# Patient Record
Sex: Female | Born: 1960 | Race: White | Hispanic: No | Smoking: Former smoker
Health system: Southern US, Community
[De-identification: ages and names within clinical notes are randomized; demographics above are authoritative.]

## PROBLEM LIST (undated history)

## (undated) DIAGNOSIS — R112 Nausea with vomiting, unspecified: Secondary | ICD-10-CM

## (undated) DIAGNOSIS — J449 Chronic obstructive pulmonary disease, unspecified: Secondary | ICD-10-CM

## (undated) DIAGNOSIS — M797 Fibromyalgia: Secondary | ICD-10-CM

## (undated) DIAGNOSIS — K219 Gastro-esophageal reflux disease without esophagitis: Secondary | ICD-10-CM

## (undated) DIAGNOSIS — Z22322 Carrier or suspected carrier of Methicillin resistant Staphylococcus aureus: Secondary | ICD-10-CM

## (undated) DIAGNOSIS — R011 Cardiac murmur, unspecified: Secondary | ICD-10-CM

## (undated) DIAGNOSIS — I10 Essential (primary) hypertension: Secondary | ICD-10-CM

## (undated) DIAGNOSIS — I959 Hypotension, unspecified: Secondary | ICD-10-CM

## (undated) DIAGNOSIS — Z9889 Other specified postprocedural states: Secondary | ICD-10-CM

## (undated) DIAGNOSIS — B1920 Unspecified viral hepatitis C without hepatic coma: Secondary | ICD-10-CM

## (undated) DIAGNOSIS — F419 Anxiety disorder, unspecified: Secondary | ICD-10-CM

## (undated) DIAGNOSIS — K7460 Unspecified cirrhosis of liver: Secondary | ICD-10-CM

## (undated) DIAGNOSIS — G43909 Migraine, unspecified, not intractable, without status migrainosus: Secondary | ICD-10-CM

## (undated) DIAGNOSIS — E039 Hypothyroidism, unspecified: Secondary | ICD-10-CM

## (undated) HISTORY — PX: HEMORRHOID SURGERY: SHX153

## (undated) HISTORY — PX: RECTAL PROLAPSE REPAIR: SHX759

## (undated) HISTORY — PX: PARTIAL HYSTERECTOMY: SHX80

## (undated) HISTORY — DX: Migraine, unspecified, not intractable, without status migrainosus: G43.909

## (undated) HISTORY — PX: CHOLECYSTECTOMY: SHX55

## (undated) HISTORY — PX: ICD IMPLANT: EP1208

## (undated) HISTORY — DX: Carrier or suspected carrier of methicillin resistant Staphylococcus aureus: Z22.322

## (undated) HISTORY — PX: BACK SURGERY: SHX140

## (undated) HISTORY — DX: Hypothyroidism, unspecified: E03.9

## (undated) HISTORY — DX: Unspecified viral hepatitis C without hepatic coma: B19.20

## (undated) HISTORY — PX: ABDOMINAL HYSTERECTOMY: SHX81

---

## 1995-08-10 HISTORY — PX: WRIST SURGERY: SHX841

## 2002-02-19 ENCOUNTER — Inpatient Hospital Stay (HOSPITAL_COMMUNITY): Admission: RE | Admit: 2002-02-19 | Discharge: 2002-02-23 | Payer: Self-pay | Admitting: General Surgery

## 2003-08-22 ENCOUNTER — Ambulatory Visit (HOSPITAL_COMMUNITY): Admission: RE | Admit: 2003-08-22 | Discharge: 2003-08-22 | Payer: Self-pay | Admitting: Family Medicine

## 2003-09-06 ENCOUNTER — Ambulatory Visit (HOSPITAL_COMMUNITY): Admission: RE | Admit: 2003-09-06 | Discharge: 2003-09-07 | Payer: Self-pay | Admitting: Neurosurgery

## 2004-01-14 ENCOUNTER — Encounter: Admission: RE | Admit: 2004-01-14 | Discharge: 2004-01-14 | Payer: Self-pay | Admitting: Neurosurgery

## 2004-02-03 ENCOUNTER — Encounter: Admission: RE | Admit: 2004-02-03 | Discharge: 2004-02-03 | Payer: Self-pay | Admitting: Neurosurgery

## 2005-10-21 ENCOUNTER — Ambulatory Visit: Payer: Self-pay | Admitting: Internal Medicine

## 2005-11-10 ENCOUNTER — Observation Stay (HOSPITAL_COMMUNITY): Admission: RE | Admit: 2005-11-10 | Discharge: 2005-11-11 | Payer: Self-pay | Admitting: General Surgery

## 2005-11-10 ENCOUNTER — Encounter (INDEPENDENT_AMBULATORY_CARE_PROVIDER_SITE_OTHER): Payer: Self-pay | Admitting: Specialist

## 2005-11-12 ENCOUNTER — Emergency Department (HOSPITAL_COMMUNITY): Admission: EM | Admit: 2005-11-12 | Discharge: 2005-11-12 | Payer: Self-pay | Admitting: Emergency Medicine

## 2005-12-08 ENCOUNTER — Ambulatory Visit: Payer: Self-pay | Admitting: Internal Medicine

## 2006-04-15 ENCOUNTER — Ambulatory Visit: Payer: Self-pay | Admitting: Internal Medicine

## 2007-01-05 ENCOUNTER — Ambulatory Visit: Payer: Self-pay | Admitting: Internal Medicine

## 2007-09-04 ENCOUNTER — Emergency Department (HOSPITAL_COMMUNITY): Admission: EM | Admit: 2007-09-04 | Discharge: 2007-09-04 | Payer: Self-pay | Admitting: Emergency Medicine

## 2007-09-06 ENCOUNTER — Inpatient Hospital Stay (HOSPITAL_COMMUNITY): Admission: EM | Admit: 2007-09-06 | Discharge: 2007-09-10 | Payer: Self-pay | Admitting: Emergency Medicine

## 2007-09-07 ENCOUNTER — Encounter (INDEPENDENT_AMBULATORY_CARE_PROVIDER_SITE_OTHER): Payer: Self-pay | Admitting: General Surgery

## 2007-11-09 ENCOUNTER — Encounter: Admission: RE | Admit: 2007-11-09 | Discharge: 2007-11-09 | Payer: Self-pay | Admitting: Neurosurgery

## 2010-12-22 NOTE — Assessment & Plan Note (Signed)
Karla Young, Karla Young                  CHART#:  [id number]   DATE:  01/05/2007                       DOB:  [birth year]   CHIEF COMPLAINT:  Follow up HCV/GERD.   SUBJECTIVE:  Patient is a 50 year old Caucasian female with a history of  chronic HCV.  She began treatment with Pegasys and Copegus with a start  date of April 15, 2006 for her hepatitis C genotype 3E.  She was  initially treated in 2002, responded to therapy.  In June, 2008, she had  undetectable HCV RNA performed by Dr. Selinda Flavin.  She presented again  in 2007 with elevated LFTs and was noted to have a positive HCV RNA.  Again, her genotype is 1 and 2.  Her boyfriend has a history of non-A  and non-B hepatitis back in 1990 but has not had any further workup.  She discontinued treatment on June 10, 2006 after recurrent bouts of  MRSA.  She also has diabetes mellitus.  She tells me her TSH has been  abnormal, and her dose of Levoxyl has been changed recently by Dr.  Dimas Aguas.  She otherwise is doing well.  She is wanting to postpone hep C  treatment at this time.   CURRENT MEDICATIONS:  See the list from Jan 05, 2007.   ALLERGIES:  No known drug allergies.   OBJECTIVE:  VITAL SIGNS:  Weight 190 pounds.  Height 64 inches.  Temp  98.6, blood pressure 128/78, pulse 60.  GENERAL:  She is a well-developed and well-nourished female in no acute  distress.  HEENT:  Sclerae clear, nonicteric.  Conjunctivae are pink.  Oropharynx  is pink and moist without any lesions.  CHEST:  Heart has a regular rate and rhythm, normal S1 and S2.  ABDOMEN:  Positive bowel sounds x4.  No bruits auscultated.  Soft,  nontender, nondistended without palpable mass or hepatosplenomegaly.  No  rebound tenderness or guarding.  EXTREMITIES:  Without clubbing or edema bilaterally.   ASSESSMENT:  Karla Young is a 50 year old female with a history of  hepatitis C genotype 3E with successful treatment back in 2002, who  presented in 2007 with hepatitis C  genotype 1 and 2.  She discontinued,  after approximately eight weeks of Pegasys and Copegus, treatment due to  recurrent MRSA.  She and her partner have multiple questions today  regarding treatment.  She is wanting to try treatment again in the Fall  possibly, but not at this time.  I discussed with her treatment options,  which would include f/u with Dr. Karilyn Cota again in the Fall versus being  seen at the hepatitis C clinic in [place].   She has chronic gastroesophageal reflux disease, well controlled on the  AcipHex.   PLAN:  1. She is going to call in the fall and either discuss possible      treatment options with Dr. Karilyn Cota or HCV clinic in [place].  2. She is to call us if she has any problems in the interim.  3. Labs from Dr. Jeannette How office will also be reviewed.       Nicholas Lose, N.P.  Electronically Signed     Lionel December, M.D.  Electronically Signed    KC/MEDQ  D:  01/06/2007  T:  01/06/2007  Job:  [id number]   cc:   Caryn Bee  Dimas Aguas

## 2010-12-22 NOTE — H&P (Signed)
NAME:  CIERRAH, DACE NO.:  [id number]   MEDICAL RECORD NO.:  [id number]          PATIENT TYPE:  EMS   LOCATION:  ED                            FACILITY:  [facility]   PHYSICIAN:  Osvaldo Shipper, MD     DATE OF BIRTH:  1961/07/03   DATE OF ADMISSION:  09/06/2007  DATE OF DISCHARGE:  LH                              HISTORY & PHYSICAL   PRIMARY MEDICAL DOCTOR:  [place].  [place]   INFECTIOUS DISEASE:  Dr. Wyman Songster?, ID at [facility].   ADMITTING DIAGNOSES:  1. Right posterior thigh methicillin-resistant Staphylococcus aureus      infection.  2. Rule out abscess.  3. History of recurrent MRSA infections.  4. Hypothyroidism.  5. History of depression.  6. History of hepatitis C.   CHIEF COMPLAINT:  Right leg pain for the past 6 days.   HISTORY OF PRESENT ILLNESS:  The patient is a 50 year old Caucasian  female who has a history of recurrent MRSA.  The last episode was in the  left posterior thigh about 1-1/2 months ago.  The patient has been on  Bactrim on-and-off for the past almost 2 years.  She developed a small  lesion in the back of her right thigh about a week ago.  This  progressively increased in size, and became more and more painful.  The  pain increased to about a 10/10 in intensity.  She came into the  emergency department, on Sunday night, she was seen by the ED physician  who gave her a dose of Ancef and prescribed her Bactrim.  This, however,  did not relieve her symptoms; and her lesion got worse; and she decided  to come back to the hospital today.  She has been having fever with  chills.  Denies any nausea, vomiting, or diarrhea.  Denies any shortness  of breath, or chest pain.   MEDICATIONS AT HOME INCLUDE:  1. Imitrex as needed orally.  2. Xanax 1 mg t.i.d.  3. Levothyroxine 112 mcg daily.  4. Effexor 75 mg daily.  5. Aciphex 20 mg daily.  6. Bactrim.  7. Up until today, she was on Vicodin and Percocet, as  needed, for      pain.   ALLERGIES:  No known drug allergies.   PAST MEDICAL HISTORY:  1. Started having MRSA skin infections back in 2007 and she has had      episodes of recurrent disease since then.  She has been followed by      the infectious disease clinic in [facility].  2. History of hypothyroidism.  3. Depression.  4. She had cholecystectomy in 2007 after which all of her problems      started, according to the patient.  5. History of hernia repair.  6. History of right wrist surgery.  7. C-section.  8. Rectal prolapse.  9. Hemorrhoids in the past.   SOCIAL HISTORY:  Lives in [place] with her boyfriend, smoked one half pack  of cigarettes on a daily basis.  Occasional alcohol use.  Denies  any to  illicit drug use.   FAMILY HISTORY:  Positive for heart trouble of unknown type.   REVIEW OF SYSTEMS:  GENERAL REVIEW OF SYSTEMS:  positive for weakness.  HEENT:  Unremarkable.  CARDIOVASCULAR:  Unremarkable.  RESPIRATORY:  Unremarkable.  GI:  Unremarkable.  GU:  Unremarkable.  DERMATOLOGIC:  As  in HPI.  All other systems unremarkable.   PHYSICAL EXAMINATION:  VITAL SIGNS:  Temperature 98.4, blood pressure  117/79, heart rate 80, respiratory rate 20, saturation 95% on room air.  GENERAL EXAM:  An obese, white female in no distress.  HEENT:  There is no pallor, no icterus.  Oral mucous membranes are  slightly done dry.  No oral lesions are noted.  NECK:  Soft and supple.  No thyromegaly is appreciated.  LUNGS:  Clear to auscultation bilaterally.  No wheezes, rales, rhonchi.  CARDIOVASCULAR:  S1-S2 is normal, regular, no murmurs appreciated.  ABDOMEN:  Soft, nontender, nondistended.  Bowel sounds are present.  No  masses or organomegaly are appreciated.  SKIN EXAMINATION:  The posterior aspect of the right thigh reveals an  erythematosus, warm, tender lesion which has spread to occupy most of  the posterior right thigh, and also is spreading anteriorly.  The whole   area is tender to palpation.  Induration is also noted around the  central aspect of this lesion which is draining yellowish, bloody  material.  EXTREMITIES:  Peripheral pulses are palpable.   LABORATORY DATA:  The only thing that we have is a CBC which shows a  white count of 8.6, hemoglobin 13, platelet count of 252.  She has been  on Bactrim.  Her BMET is pending.   ASSESSMENT:  This is a 50 year old Caucasian female with medical  problems as stated earlier who presents with what appears to be a right  posterior thigh cellulitis and possibly with an abscess.  Wound cultures  from two days ago shows that this is methicillin-resistant  Staphylococcus aureus, community-acquired.   PLAN:  1. MRSA infection posterior right thigh.  This will be treated with IV      vancomycin for the next few days.  We will have surgery see this      patient to see if she needs I&D.  Poor venous access, this is      prompting a PICC line placement.  If they are unable to do that, we      will request surgery to place a central line in her, a femoral line      should be adequate.  2. History of hypothyroidism.  Continue levothyroxine.  3. History of depression.  Continue Effexor.  4. History of hepatitis C, chronic.  She has not been on treatment for      the past 1-1/2 years, and we will check her LFTs tomorrow morning.  5. The patient follows the usual precautions for MRSA, she uses      antibacterial soap.  She tells me that she has had nasal swabs done      in the past at [facility] which have been negative for MRSA.  6. Hopefully, this patient will improve in the next 3-4 days, and be      able to go home on p.o. long-term antibiotics.      Osvaldo Shipper, MD  Electronically Signed     GK/MEDQ  D:  09/06/2007  T:  09/06/2007  Job:  [id number]   cc:   Selinda Flavin  Fax: [phone]

## 2010-12-22 NOTE — Op Note (Signed)
NAME:  Karla Young, Karla Young NO.:  [id number]   MEDICAL RECORD NO.:  [id number]          PATIENT TYPE:  INP   LOCATION:  A313                          FACILITY:  [facility]   PHYSICIAN:  Barbaraann Barthel, M.D. DATE OF BIRTH:  [birth year]   DATE OF PROCEDURE:  09/07/2007  DATE OF DISCHARGE:                               OPERATIVE REPORT   PREOPERATIVE DIAGNOSIS:  Abscess posterior right thigh with another area  of folliculitis and abscess located slightly above this.   POSTOPERATIVE DIAGNOSIS:  Abscess posterior right thigh with another  area of folliculitis and abscess located slightly above this.   NOTE:  This is a 50 year old white female with history of hepatitis C  and also a past history of MRSA infections.  The last abscess was  drained in [facility] in December 2008.  She presented to the medical  service with another abscess of her posterior thigh.  A PICC line was  inserted and she has been placed on vancomycin. This area needs to be  debrided sharply and we discussed this preoperatively with her.  We  discussed complications not limited to but including bleeding, infection  and the need for possible further surgery.  Informed consent was  obtained.   GROSS OPERATIVE FINDINGS:  Consistent with a large abscess in the  posterior thigh, small folliculitis area just slightly proximal to that.   SPECIMENS:  Cultures were obtained and the debrided tissue was sent.   PROCEDURE:  Excision and debridement of right posterior thigh abscess.   TECHNIQUE:  The patient was placed in the left lateral position so that  we were able to visualize the posterior right thigh which was prepped  with Betadine solution and draped in the usual manner.  The area of the  abscess and necrotic tissue was elliptically excised. The necrotic  tissue was sharply debrided as was the small area of folliculitis above  it.  Cultures were obtained. The debrided tissue was also sent.  We then  irrigated with normal saline solution, controlled the bleeding with the  cautery device, and packed the wound open with normal saline soaked  gauze.  A sterile dressing was applied.  Prior to closure, all sponge,  needle, and instrument counts were found to be correct and estimated  blood loss was minimal.  The patient was replaced of crystalloids.   This patient will be followed by the medical service as well as myself  there will be no change of service.  I have consulted the physical  therapy department to begin dressing changes tomorrow, September 08, 2007,  and they are to irrigate this with normal saline and pack this with  saline soaked gauze.  Please note hepatitis C precautions.      Barbaraann Barthel, M.D.  Electronically Signed     WB/MEDQ  D:  09/07/2007  T:  09/07/2007  Job:  [id number]

## 2010-12-22 NOTE — Consult Note (Signed)
NAME:  Karla Young, FERNICOLA NO.:  [id number]   MEDICAL RECORD NO.:  [id number]          PATIENT TYPE:  INP   LOCATION:  A313                          FACILITY:  [facility]   PHYSICIAN:  Barbaraann Barthel, M.D. DATE OF BIRTH:  [birth year]   DATE OF CONSULTATION:  DATE OF DISCHARGE:                                 CONSULTATION   REFERRING SERVICE:  [facility].   NOTE:  Surgery was asked to see this 50 year old white female who has an  abscess on the posterior thigh; she has had this here for several days.  She has had a history of MRSA infections in the past; last abscess was  drained in December in [place].  She is a nondiabetic.  She has had  cultures of her nasal cavities; however, these were negative according  to her.  And these cultures were done apparently in [place].   PLAN:  We will plan for debridement and drainage of this abscess and  cultures in the operating room tomorrow.  She is to have warm compresses  and continue her vancomycin she through her PICC line until then.  We  discussed the surgery with her, discussing complications not limited to,  but including bleeding, infection and the possible need for further  surgery.  Informed consent was obtained.      Barbaraann Barthel, M.D.  Electronically Signed     WB/MEDQ  D:  09/06/2007  T:  09/07/2007  Job:  [id number]   cc:   [facility]

## 2010-12-25 NOTE — H&P (Signed)
[facility]  Patient:    CELINES, FEMIA [facility] Visit Number: [id number] MRN: [id number]          Service Type: Attending:  Franky Macho, M.D. Dictated by:   Franky Macho, M.D. Adm. Date:  02/12/02   CC:         Dr. Earline Mayotte, M.D.   History and Physical  DATE OF BIRTH:  [birth year]  CHIEF COMPLAINT:  Rectal prolapse.  HISTORY OF PRESENT ILLNESS:  The patient is a 50 year old white female who presents with rectal pain and bleeding secondary to a rectal prolapse.  This has been present over the last three months and has not resolved with stool softeners or ProctoFoam cream or Rowasa enemas.  She had a colonoscopy two years ago which was reportedly negative.  She is status post a hemorrhoidectomy in March of 2002.  She states she has irritable bowel disease with constipation.  She has a pressure sensation in her rectum constantly. She also has noticed some soiling of her underwear.  PAST MEDICAL HISTORY:  Includes depression, anxiety disorder, extrinsic allergies, irritable bowel disease.  PAST SURGICAL HISTORY:  As noted above, fractured wrist repair in 2000, and C-section in 1987.  CURRENT MEDICATIONS:  Include Prozac 20 mg p.o. q.d., Xanax 1 mg p.o. t.i.d., Nexium 20 mg p.o. q.d., Citrucel, and lactulose.  She also takes Allegra one tablet p.o. q.d. for her extrinsic allergies.  ALLERGIES:  No known drug allergies.  REVIEW OF SYSTEMS:  The patient does smoke daily.  She denies any significant alcohol use.  She denies any other cardiopulmonary disorders or bleeding disorders.  PHYSICAL EXAMINATION:  GENERAL:  The patient is a well-developed and well-nourished white female in no acute distress.  VITAL SIGNS:  She is afebrile and vital signs are stable.  LUNGS:  Clear to auscultation with equal breath sounds bilaterally.  HEART:  Examination reveals a regular rate and rhythm without S3, S4, or murmurs.  ABDOMEN:  Soft,  nontender, and nondistended.  No hepatosplenomegaly or masses are noted.  RECTAL:  Examination reveals a rectal prolapse which appears to be a full thickness in nature when straining.  No active bleeding is noted, though irritated mucosa is noted.  Rectal tone is fair.  IMPRESSION:  Rectal prolapse.  PLAN:  The patient is scheduled to undergo a proctopexy with possible anterior partial colectomy on February 12, 2002.  The risks and benefits of the procedure including bleeding, infection, constipation, the possibility of recurrence of the prolapse were fully explained to the patient.  She gave informed consent. She is also aware of incontinence can occur.  A Colyte prep has been prescribed. Dictated by:   Franky Macho, M.D. Attending:  Franky Macho, M.D. DD:  02/08/02 TD:  02/12/02 Job: [id number]

## 2010-12-25 NOTE — Op Note (Signed)
NAME:  Karla Young, Karla Young                           ACCOUNT NO.:  [id number]   MEDICAL RECORD NO.:  [id number]                   PATIENT TYPE:  OIB   LOCATION:  3022                                 FACILITY:  [facility]   PHYSICIAN:  Coletta Memos, M.D.                  DATE OF BIRTH:  12-30-1960   DATE OF PROCEDURE:  09/06/2003  DATE OF DISCHARGE:                                 OPERATIVE REPORT   PREOPERATIVE DIAGNOSIS:  Displaced cyst, far lateral position, left L3-4.   POSTOPERATIVE DIAGNOSIS:  Displaced cyst, far lateral position, left L3-4.   PROCEDURE:  Far lateral diskectomy with microdissection, L3-4, left.   SURGEON:  Coletta Memos, M.D.   ASSISTANT:  Hilda Lias, M.D.   ANESTHESIA:  General endotracheal.   INDICATIONS:  Karla Young is a 50 year old woman who presented with severe  pain in the back and left lower extremity since July 10, 2003.  MRI  showed a far lateral disk herniation at L3-4, compromising the left L3 nerve  root.   OPERATIVE NOTE:  Karla Young was brought to the operating room, intubated, and  placed under general anesthesia without difficulty.  Karla Young was moved prone  onto the Wilson frame, and all pressure points were properly padded.  Her  skin was prepped, and Karla Young was draped in a sterile fashion.  I infiltrated 10  mL of 0.5% lidocaine with 1:200,000 epinephrine in proposed incision site.  That was done after taking a preoperative localizing x-ray.  The skin was  prepped.  Karla Young was draped in a sterile fashion. I opened the skin with a #10  blade and took that down to the thoracolumbar fascia.  I exposed what I  believed to be the lamina of L3, took an x-ray, and I was working at the  correct site.  Then finding the pars intra-articularis of L3, I removed a  small layer of the outer surface of the bone laterally until I was able to  get underneath the ligamentum flavum.  I then was able to identify the nerve  root and what was a very large hump underneath  it.  Using the  microdissection technique and with Dr. Cassandria Santee assistance, we removed what  was a large, partially calcified disk herniation, which had the L3 nerve  root under a significant amount of tension.  The disk was removed in a  piecemeal fashion using Epstein curettes and pituitary rongeurs.  When I was  satisfied that the nerve had been decompressed, I then infiltrated Depo-  Medrol around the nerve root since we were working at the ganglion.  I took  another intraoperative x-ray to make sure that I was at the neural foramen,  and I was. I controlled some bleeding with bipolar cautery.  The wound was  closed in a layered fashion using Vicryl sutures to reapproximate the  thoracolumbar fascia and subcutaneous tissues.  The skin  edges were  reapproximated with Vicryl.  Dermabond was used for a sterile dressing.  The  patient tolerated the procedure well, moving all extremities  postoperatively.                                               Coletta Memos, M.D.    KC/MEDQ  D:  09/06/2003  T:  09/07/2003  Job:  [id number]

## 2010-12-25 NOTE — Consult Note (Signed)
NAME:  Karla Young, BOBAK NO.:  [id number]   MEDICAL RECORD NO.:  [id number]          PATIENT TYPE:  EMS   LOCATION:  ED                            FACILITY:  [facility]   PHYSICIAN:  Barbaraann Barthel, M.D. DATE OF BIRTH:  1961/05/19   DATE OF CONSULTATION:  11/12/2005  DATE OF DISCHARGE:  11/12/2005                                   CONSULTATION   Surgery was notified about the ER visit of the patient, [name], who is status  post laparoscopic cholecystectomy and liver biopsy on Wednesday, three days  ago.  She did well postoperatively and was discharged.  She called with  abdominal pain and I told her to go to the emergency room and a CT scan was  performed to rule out a possibility of a biloma or postoperative bleeding  from liver biopsy.  In essence, a CT scan did not show any changes  suggestive of those things.  She had some minimal fluid which was consistent  with postoperative changes and minimal free air also consistent with  postoperative changes.  Her H&H was 13.6 and 39.5 with a white count of  12,000.  Her liver function studies were slightly elevated with an alkaline  phosphate of 110, AST of 72 and bilirubin was normal at 0.5.  Her BUN and  creatinine were within normal limits.  Her electrolytes were within normal  limits.  Please note liver function studies were elevated in this patient  who has the diagnosis of hepatitis C.   At any rate, clinically her abdomen is soft.  Bowel sounds are present.  She  has some incisional pain where her 5 mm cannula sites were placed.  There is  no undue leaking from this area or any bleeding from this area noted and her  abdomen is soft and the bowel sounds are present.  Lungs are also clear.  The rest of the exam apart from some incisional pain was normal.   It should be noted that this patient has likely a history of drug abuse and  the assessment of her pain is problematic for these reasons.  At any rate,  she is told to  continue on a clear liquid diet.  We will follow her  perioperatively.  She  is giving nothing new for pain.  She was discharged previously on Tylox.  She is still to take some lactulose as well to help her.  At any rate, there  does not appear to be any postoperative surgical problem here.  We will  follow her perioperatively after which she is to return to Dr. Lionel December  for treatment of her hepatitis C.      Barbaraann Barthel, M.D.  Electronically Signed     WB/MEDQ  D:  11/12/2005  T:  11/13/2005  Job:  [id number]   cc:   Lionel December, M.D.  [address]  [place]  [place] [address]   Nicoletta Dress. Colon Branch, M.D.  Fax: [phone]

## 2010-12-25 NOTE — Op Note (Signed)
[facility]  Patient:    Karla Young, Karla Young Visit Number: [id number] MRN: [id number]          Service Type: SUR Location: 3A A310 01 Attending Physician:  Dalia Heading Dictated by:   Franky Macho, M.D. Proc. Date: 02/19/02 Admit Date:  02/19/2002   CC:         Dr. Dimas Aguas, Percell Belt, M.D.   Operative Report  AGE:  50 years old.  PREOPERATIVE DIAGNOSIS:  Rectal prolapse.  POSTOPERATIVE DIAGNOSIS:  Rectal prolapse.  PROCEDURE:  Proctopexy, partial colectomy.  SURGEON:  Franky Macho, M.D.  ANESTHESIA:  General endotracheal.  INDICATIONS:  The patient is a 50 year old white female who presents with persistent rectal prolapse.  She now presents to the operating room for a proctopexy, possible anterior partial colectomy.  The risks and benefits of the procedure including bleeding, infection, cardiopulmonary difficulties, and the possibility of recurrence of the rectal prolapse were fully explained to the patient, gaining informed consent.  DESCRIPTION OF PROCEDURE:  Patient was placed in the supine position.  After induction of general endotracheal anesthesia, the abdomen was prepped and draped using the usual sterile technique with Betadine.  A midline incision was made below the umbilicus to the suprapubic region.  The peritoneal cavity was entered into without difficulty.  The abdomen was then explored.  Liver was within normal limits.  The NG tube was noted to be in appropriate position in the stomach.  The rest of the abdominal cavity appeared to be within normal limits and no abnormalities were found.  The right and left ovaries were within normal limits.  The uterus had no fibroids in it.  There was no evidence of endometriosis.  The patient did have a redundant sigmoid colon.  The mesentery was incised along the line of Toldt from the proximal sigmoid colon down to the rectum.  The anterior fascia over the rectum was also  incised.  The rectal attachments were freed away circumferentially down to the pelvic floor.  Next, a partial sigmoid colectomy was performed.  GIA stapler was used both proximally and distally in the sigmoid colon and fired.  Next, a side-to-side colorectal anastomosis was then performed.  The colotomy was closed using a TA 60 stapler.  The staple line was bolstered using 3-0 silk Lembert sutures.  The anastomosis was noted to be patent.  The rectum was then pexed to the sacrum using 2-0 Novofil interrupted sutures.  This was done on either side.  On inspection of the abdominal cavity, there was no redundancy of the colon from the descending colon down to the rectum.  The abdominal cavity was copiously irrigated with normal saline and gentamicin.  Surgicel was placed along the posterior sacral area.  The abdominal contents were returned into the abdominal cavity in an orderly fashion.  All surgical personnel changed their gloves.  The fascia was reapproximated using looped O Novofil running suture.  The subcutaneous layer was reapproximated using the 2-0 Vicryl interrupted suture.  The skin was closed using staples.  Betadine ointment and dry sterile dressings were applied.  All tape and needle counts were correct at the end of the procedure.  The patient was extubated in the operating room and went back to the recovery room awake in stable condition.  COMPLICATIONS:  None.  SPECIMENS:  Sigmoid colon.  BLOOD LOSS:  100 cc. Dictated by:   Franky Macho, M.D. Attending Physician:  Dalia Heading DD:  02/19/02 TD:  02/20/02 Job:  [id number]

## 2010-12-25 NOTE — Discharge Summary (Signed)
[facility]  Patient:    ENJOLI, TIDD Visit Number: [id number] MRN: [id number]          Service Type: SUR Location: 3A A310 01 Attending Physician:  Dalia Heading Dictated by:   Franky Macho, M.D. Admit Date:  02/19/2002 Discharge Date: 02/23/2002               Lionel December, M.D.  Dr. Dimas Aguas   Discharge Summary  AGE:  50 years old.  HISTORY OF PRESENT ILLNESS:  The patient is a 50 year old white female who presented for treatment of rectal prolapse.  HOSPITAL COURSE:  She underwent a proctopexy and anterior partial colectomy on February 19, 2002.  She tolerated the procedure well.  Her postoperative course was for the most part unremarkable.  Her diet was advanced without difficulty once her bowel function returned.  She states that her rectal pain has resolved.  FOLLOW-UP:  I will see the patient in follow-up on February 27, 2002.  DISCHARGE MEDICATIONS: 1. Lortab 10/650 mg one to two tablets p.o. q.6h. p.r.n. pain. 2. Colace one tablet p.o. b.i.d. 3. She is to resume other medications as previously prescribed.  PRINCIPAL DIAGNOSES: 1. Rectal prolapse. 2. Anxiety disorder. 3. Depression. 4. Extrinsic allergies.  PROCEDURES:  Proctopexy and anterior partial colectomy on February 19, 2002. Dictated by:   Franky Macho, M.D. Attending Physician:  Dalia Heading DD:  02/23/02 TD:  02/28/02 Job: [id number]

## 2010-12-25 NOTE — Discharge Summary (Signed)
[name], Young NO.:  [id number]   MEDICAL RECORD NO.:  [id number]          PATIENT TYPE:  INP   LOCATION:  A313                          FACILITY:  [facility]   PHYSICIAN:  Margaretmary Dys, M.D.DATE OF BIRTH:  [birth year]   DATE OF ADMISSION:  09/06/2007  DATE OF DISCHARGE:  02/01/2009LH                               DISCHARGE SUMMARY   DISCHARGE DIAGNOSES:  1. Methicillin resistant Staphylococcus aureus abscess of the right      thigh.  Status post incision and drainage.  2. Gastroesophageal reflux disease.  3. 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  4. History of Hepatitis C.  5. History of depression.  6. Hypothyroidism.   DISCHARGE MEDICATIONS:  1. Imitrex daily.  2. Xanax 1 mg p.o. t.i.d.  3. Levothyroxine 112 mcg daily.  4. Effexor 75 mg p.o. daily.  5. Aciphex 20 mg p.o. daily.  6. Percocet 5/325, one p.o. q.4h. p.r.n. pain.  7. Vancomycin 1 g IV q.12 for a total of 7 days, the home health nurse      has already arranged for this.   DIET:  Regular.   ACTIVITY:  As tolerated.  Patient encouraged to ambulate.   DRESSING:  Patient is to irrigate with normal saline packed with saline  soaked about 2 times a day.  The gauze size will be about 4 x 4.   HOSPITAL COURSE:  Karla Young is a 50 year old female who has a history of  recurrent MRSA infection.  Her last episode was in her left posterior  thigh about a month and a half ago for which patient has been on Bactrim  on and off for almost 2 years now.  Patient reported seeing a small  lesion on the back of her right thigh about a week ago, this apparently  increased in size and became more painful.  The pain became fairly  severe and she reported to the emergency room on the day of admission.  She received some Ancef and Bactrim and her symptoms worsened for which  she then presented to the hospital again the next day.  Patient was  having fever with  chills, she denies any nausea or vomiting.   PHYSICAL EXAMINATION AT TIME OF ADMISSION:  Reported a female who was  obese, in no distress, her temperature was 98.4.  VITALS:  Blood pressure was 117/79, heart rate of 80, respiratory rate  of 20, saturation was 95% on room air.  RIGHT THIGH EXAM:  Erythematous, warm tender lesion which spread to most  of the posterior right thigh and also anteriorly.  The area was tender  to palpation.  There was induration in the central aspect of this lesion  which was noted to be drainy, yellowish, bloody material.  As a result,  an incision and drainage was planned and this was done by Dr. Malvin Johns  on September 07, 2007.   A PICC line was also placed as patient was anticipated to have home  vancomycin treatment, patient has been through this before.   Patient continued to do  fairly well with no concerns for sepsis.  Patient was seen by me for the first time on September 10, 2007.  She had  been doing well, she was afebrile, she had no leukocytosis and had no  evidence of sepsis.  As a result, patient was discharged home.   CONSULTATIONS OBTAINED:  1. Barbaraann Barthel, MD, Surgery.  2. PICC line nurse for PICC line placement.   DISPOSITION:  To home.   PERTINENT LABORATORY DATA:  On the day of admission, the white blood  cell count was 8.6, hemoglobin of 13, platelet count was 252,000, basic  metabolic profile was unremarkable.      Margaretmary Dys, M.D.  Electronically Signed     AM/MEDQ  D:  10/24/2007  T:  10/24/2007  Job:  [id number]   cc:   Selinda Flavin  Fax: [phone]

## 2010-12-25 NOTE — Op Note (Signed)
NAME:  Karla Young, Karla Young NO.:  [id number]   MEDICAL RECORD NO.:  [id number]          PATIENT TYPE:  AMB   LOCATION:  DAY                           FACILITY:  [facility]   PHYSICIAN:  Barbaraann Barthel, M.D. DATE OF BIRTH:  1960-12-01   DATE OF PROCEDURE:  11/10/2005  DATE OF DISCHARGE:                                 OPERATIVE REPORT   SURGEON:  Dr. Malvin Johns.   PREOPERATIVE DIAGNOSIS:  1.  Cholecystitis, cholelithiasis.  2.  Hepatitis C.   PROCEDURE:  Laparoscopic cholecystectomy with liver biopsy.   NOTE:  This 50 year old white female was referred by the GI service for  cholelithiasis.  She had symptoms of recurrent cholecystitis with right  upper quadrant pain, nausea and vomiting.  Her liver functions were slightly  elevated.  Bilirubin was normal, however, and she was checked out for  recurrence of her hepatitis C.  That is why liver biopsy was also requested.   We discussed the procedure in detail preoperatively, discussing  complications not limited to but including bleeding, infection, damage to  bile ducts, perforation of organs and transitory diarrhea and the  possibility of open cholecystectomy may be needed.  Informed consent was  obtained.   GROSS OPERATIVE FINDINGS:  The patient had some adhesions around her lower  incision.  There was no problem in the insertion of the cannulas.  She had  previous surgery below with a infraumbilical midline incision and an  incisional hernia which was repaired with mesh.  Right upper quadrant there  were multiple adhesions around the gallbladder, a mildly thickened  gallbladder with a short cystic duct and a large stone about the size of an  olive within it.  The common bile duct appeared to be normal, otherwise the  right upper quadrant appeared to be normal.  The liver did not appear to be  cirrhotic.   TECHNIQUE:  The patient was placed in supine position.  After the adequate  administration of general anesthesia  via endotracheal intubation, her entire  was prepped with Betadine solution and draped in usual manner.  A Foley  catheter was aseptically inserted.  A periumbilical incision was carried out  on the superior aspect of the umbilicus away from the previous surgical  scar.  We dissected down to the fascia which we opened the midline and  placed a Veress needle and inflated the abdomen with approximately 3.0  liters of CO2.  We then placed 11 mm cannula using the Visiport technique  uneventfully.  We then used the Endo shear sutures to take down some rather  dense adhesions about the gallbladder and about the liver.  These were taken  down without problem.  The gallbladder was grasped.  The cystic duct and  cystic artery were clearly visualized both of which were triply silver  clipped and divided.  The gallbladder was removed without spillage from the  liver bed using the hook cautery device.  There was an area on the rim of  the liver which lent itself easily to a biopsy.  I grasped this with the  pituitary  forceps biopsy and then excised using the Endo shears a portion of  liver with this and I was able to control the bleeding using the cautery  device.  After cauterizing I then placed a piece of Surgicel over this area  as well as over the raw liver bed and placed a Jackson-Pratt drain that  exited from one the lateral port sites.  We then closed the port sites using  0 Polysorb in the area of the umbilicus and the epigastrium and then closed  all skin incisions with a stapling device.  I used to approximately 10 mL of  1/2% Sensorcaine to help with postoperative discomfort.  This drain was  sutured in place with 3-0 nylon.  Prior to closure all sponge, needle and  instrument counts found to be correct.  Estimated blood loss was minimal.  The patient tolerated the procedure well was taken to recovery room in  satisfactory condition.      Barbaraann Barthel, M.D.  Electronically  Signed     WB/MEDQ  D:  11/10/2005  T:  11/11/2005  Job:  [id number]   cc:   Lionel December, M.D.  [address]  [place]  [place] [address]

## 2011-04-29 LAB — CBC
HCT: 34.4 — ABNORMAL LOW
HCT: 36.6
Hemoglobin: 12.1
Hemoglobin: 13
MCHC: 33.5
MCHC: 35.2
MCHC: 35.5
MCV: 90.2
Platelets: 208
Platelets: 210
Platelets: 225
RBC: 4.06
RBC: 4.33
RDW: 14.1
RDW: 14.3
RDW: 14.3
WBC: 19.6 — ABNORMAL HIGH
WBC: 8.6

## 2011-04-29 LAB — BASIC METABOLIC PANEL
BUN: 3 — ABNORMAL LOW
BUN: 4 — ABNORMAL LOW
BUN: 4 — ABNORMAL LOW
CO2: 29
CO2: 33 — ABNORMAL HIGH
Calcium: 8.4
Calcium: 8.6
Calcium: 9.8
Creatinine, Ser: 0.58
GFR calc non Af Amer: >60
Glucose, Bld: 113 — ABNORMAL HIGH
Glucose, Bld: 82
Glucose, Bld: 91
Potassium: 3.6
Sodium: 137
Sodium: 139

## 2011-04-29 LAB — CULTURE, ROUTINE-ABSCESS

## 2011-04-29 LAB — DIFFERENTIAL
Basophils Absolute: 0
Basophils Absolute: 0
Basophils Absolute: 0.1
Basophils Relative: 0
Basophils Relative: 1
Basophils Relative: 1
Eosinophils Absolute: 0.2
Eosinophils Absolute: 0.5
Eosinophils Relative: 1
Eosinophils Relative: 10 — ABNORMAL HIGH
Lymphocytes Relative: 13
Lymphocytes Relative: 40
Lymphs Abs: 2.4
Monocytes Absolute: 0.5
Monocytes Absolute: 0.9
Monocytes Absolute: 1.7 — ABNORMAL HIGH
Monocytes Relative: 11
Neutro Abs: 2.1
Neutrophils Relative %: 55

## 2011-04-29 LAB — ANAEROBIC CULTURE

## 2011-04-29 LAB — WOUND CULTURE: Gram Stain: NONE SEEN

## 2011-04-29 LAB — HEPATIC FUNCTION PANEL: Bilirubin, Direct: <0.1

## 2014-02-12 ENCOUNTER — Encounter (INDEPENDENT_AMBULATORY_CARE_PROVIDER_SITE_OTHER): Payer: Self-pay | Admitting: Internal Medicine

## 2014-02-12 ENCOUNTER — Other Ambulatory Visit (INDEPENDENT_AMBULATORY_CARE_PROVIDER_SITE_OTHER): Payer: Self-pay | Admitting: Internal Medicine

## 2014-02-12 ENCOUNTER — Encounter (INDEPENDENT_AMBULATORY_CARE_PROVIDER_SITE_OTHER): Payer: Self-pay | Admitting: *Deleted

## 2014-02-12 ENCOUNTER — Ambulatory Visit (INDEPENDENT_AMBULATORY_CARE_PROVIDER_SITE_OTHER): Payer: Managed Care, Other (non HMO) | Admitting: Internal Medicine

## 2014-02-12 VITALS — BP 156/97 | HR 76 | Temp 98.0°F | Ht 64.0 in | Wt 175.4 lb

## 2014-02-12 DIAGNOSIS — E039 Hypothyroidism, unspecified: Secondary | ICD-10-CM

## 2014-02-12 DIAGNOSIS — B182 Chronic viral hepatitis C: Secondary | ICD-10-CM

## 2014-02-12 DIAGNOSIS — B1920 Unspecified viral hepatitis C without hepatic coma: Secondary | ICD-10-CM | POA: Insufficient documentation

## 2014-02-12 LAB — CBC WITH DIFFERENTIAL/PLATELET
BASOS ABS: 0.1 10*3/uL (ref 0.0–0.1)
Basophils Relative: 1 % (ref 0–1)
EOS ABS: 0.3 10*3/uL (ref 0.0–0.7)
EOS PCT: 3 % (ref 0–5)
HEMATOCRIT: 43.7 % (ref 36.0–46.0)
Hemoglobin: 15.3 g/dL — ABNORMAL HIGH (ref 12.0–15.0)
LYMPHS PCT: 25 % (ref 12–46)
Lymphs Abs: 2.4 10*3/uL (ref 0.7–4.0)
MCH: 30.3 pg (ref 26.0–34.0)
MCHC: 35 g/dL (ref 30.0–36.0)
MCV: 86.5 fL (ref 78.0–100.0)
MONO ABS: 0.8 10*3/uL (ref 0.1–1.0)
Monocytes Relative: 8 % (ref 3–12)
Neutro Abs: 6 10*3/uL (ref 1.7–7.7)
Neutrophils Relative %: 63 % (ref 43–77)
PLATELETS: 190 10*3/uL (ref 150–400)
RBC: 5.05 MIL/uL (ref 3.87–5.11)
RDW: 14.5 % (ref 11.5–15.5)
WBC: 9.5 10*3/uL (ref 4.0–10.5)

## 2014-02-12 LAB — COMPREHENSIVE METABOLIC PANEL
ALBUMIN: 4.2 g/dL (ref 3.5–5.2)
ALT: 83 U/L — ABNORMAL HIGH (ref 0–35)
AST: 71 U/L — ABNORMAL HIGH (ref 0–37)
Alkaline Phosphatase: 89 U/L (ref 39–117)
BILIRUBIN TOTAL: 0.5 mg/dL (ref 0.2–1.2)
BUN: 8 mg/dL (ref 6–23)
CO2: 26 meq/L (ref 19–32)
Calcium: 9.9 mg/dL (ref 8.4–10.5)
Chloride: 103 mEq/L (ref 96–112)
Creat: 0.81 mg/dL (ref 0.50–1.10)
GLUCOSE: 95 mg/dL (ref 70–99)
POTASSIUM: 3.8 meq/L (ref 3.5–5.3)
SODIUM: 140 meq/L (ref 135–145)
TOTAL PROTEIN: 7.6 g/dL (ref 6.0–8.3)

## 2014-02-12 NOTE — Progress Notes (Signed)
Subjective:     Patient ID: Karla Young, female   DOB: 11/20/1960, 53 y.o.   MRN: [id number]  HPI Referred to our office by Dr. Sherril CroonVyas for Hepatitis C.  Diagnosed in 2000. She is Genotype 1A. No blood transfusion. She has 4 tattoos received in 1999 and 2000. No IV drug use. She has smoke marijuana in the past. She has received treatment in the past by Dr. Karilyn Cotaehman.  She apparently had treatment failure. She was treated with Ribavirin and Inteferon in 2002. From old records, she responded to  She actually started tx while [place] but she developed MRSA and tx was stopped.   11/2005 Liver biopsy: Cholecystectomy (Liver biopsy for Hepatitis C).  The findings are consistent with chronic mildly active hepatitis, inflammatory grade 2 and probably fibrosis stage 0 or early stage 1 (focal portal fibrosis). Clinical and serologic correlation is necessary to etiologic diagnosis but the findings suggest chronic mildly active viral hepatitis.    09/05/2013 WBC 10.4, H and H 15.7 and 45.7, MCV 88, platelet ct 191. Total bili 0.3, ALP 79, AST 68, ALT 71. TSH 1.490 01/10/2014 [facility] [id number], HCV log10 6.755. Hepa C AB greater than 11.0      Review of Systems Past Medical History  Diagnosis Date  . Hepatitis C   . MRSA (methicillin resistant staph aureus) culture positive   . Hypothyroid   . Hemochromatosis   . Migraines     Past Surgical History  Procedure Laterality Date  . Cholecystectomy    . Partial hysterectomy       One ovary left  . Back surgery      Allergies not on file  No current outpatient prescriptions on file prior to visit.   No current facility-administered medications on file prior to visit.        Objective:   Physical Exam  Filed Vitals:   02/12/14 1110  Height: 5\' 4"  (1.626 m)  Weight: 175 lb 6.4 oz (79.561 kg)  Alert and oriented. Skin warm and dry. Oral mucosa is moist.   . Sclera anicteric, conjunctivae is pink. Thyroid not enlarged. No  cervical lymphadenopathy. Lungs clear. Heart regular rate and rhythm.  Abdomen is soft. Bowel sounds are positive. No hepatomegaly. No abdominal masses felt. No tenderness.  No edema to lower extremities.         Assessment:    Hepatitis C with treatment failure in the past    Plan:     CMET PT/INR, AFP, US elastrography, CBC. We will treat

## 2014-02-12 NOTE — Patient Instructions (Signed)
Labs, US abdomen with elastrography. OV in 3 months.

## 2014-02-13 LAB — AFP TUMOR MARKER: AFP-Tumor Marker: 5.7 ng/mL (ref 0.0–8.0)

## 2014-02-13 LAB — PROTIME-INR
INR: 0.99 (ref <1.50)
Prothrombin Time: 13.1 seconds (ref 11.6–15.2)

## 2014-02-15 ENCOUNTER — Ambulatory Visit (HOSPITAL_COMMUNITY)
Admission: RE | Admit: 2014-02-15 | Discharge: 2014-02-15 | Disposition: A | Discharge status: Home / Self Care | Payer: Managed Care, Other (non HMO) | Source: Ambulatory Visit | Attending: Internal Medicine | Admitting: Internal Medicine

## 2014-02-15 DIAGNOSIS — B1920 Unspecified viral hepatitis C without hepatic coma: Secondary | ICD-10-CM | POA: Insufficient documentation

## 2014-02-15 DIAGNOSIS — B182 Chronic viral hepatitis C: Secondary | ICD-10-CM

## 2014-02-15 DIAGNOSIS — K7460 Unspecified cirrhosis of liver: Secondary | ICD-10-CM | POA: Insufficient documentation

## 2014-02-15 DIAGNOSIS — Z9089 Acquired absence of other organs: Secondary | ICD-10-CM | POA: Insufficient documentation

## 2014-02-20 ENCOUNTER — Telehealth (INDEPENDENT_AMBULATORY_CARE_PROVIDER_SITE_OTHER): Payer: Self-pay | Admitting: Internal Medicine

## 2014-02-20 NOTE — Telephone Encounter (Signed)
[facility]

## 2014-02-20 NOTE — Telephone Encounter (Signed)
Karla Young I am aware that this needs to be done.

## 2014-02-21 NOTE — Telephone Encounter (Signed)
I have completed paperwork for [employer]. I have shared with Camelia Engerri that we need Hepatitis C Genotype and Hepatitis C RNA Quant. Then Dewayne Hatchnn will release patient's information to [employer].

## 2014-02-22 ENCOUNTER — Telehealth (INDEPENDENT_AMBULATORY_CARE_PROVIDER_SITE_OTHER): Payer: Self-pay | Admitting: Internal Medicine

## 2014-02-22 ENCOUNTER — Encounter (INDEPENDENT_AMBULATORY_CARE_PROVIDER_SITE_OTHER): Payer: Self-pay

## 2014-02-22 NOTE — Telephone Encounter (Signed)
The Genotype and quaint are in may notes. Please review.

## 2014-03-04 ENCOUNTER — Telehealth (INDEPENDENT_AMBULATORY_CARE_PROVIDER_SITE_OTHER): Payer: Self-pay | Admitting: *Deleted

## 2014-03-04 NOTE — Telephone Encounter (Signed)
Veikra. I have spoken to the Pharmacist and has been approved

## 2014-03-04 NOTE — Telephone Encounter (Signed)
Rec'd a voice message from [employer] Young,representive. Harvoni is NOT covered. The Ribiavirin , Motertabs, and the [employer] Pak are.  She ask that Karla Young , NP let them know if any of the preferred may be prescribed.  Call back Number is [phone]  Reference Number # [id number]

## 2014-03-04 NOTE — Telephone Encounter (Signed)
Has been approved for [employer]

## 2014-03-21 ENCOUNTER — Telehealth (INDEPENDENT_AMBULATORY_CARE_PROVIDER_SITE_OTHER): Payer: Self-pay | Admitting: *Deleted

## 2014-03-21 NOTE — Telephone Encounter (Signed)
Karla Young that we received a letter dated 03/04/14 stating her Karla Young has been approved and patient has been notified. Karla Young Karla Young the phone number, [phone] with case ID# [id number], to call and verify. She has not heard anything at this time. Also told Karla Young, once the medicine has been received to call the office before starting. Voice understood.

## 2014-03-27 ENCOUNTER — Encounter (INDEPENDENT_AMBULATORY_CARE_PROVIDER_SITE_OTHER): Payer: Self-pay | Admitting: *Deleted

## 2014-03-27 NOTE — Telephone Encounter (Signed)
Camelia Engerri - [facility] Heloise PurpuraViekira is the drug of choice. How do you want the patient to take the Ribavirin? This can be faxed in  Or e- scribed to [facility]. Fax number is [phone] , call [phone]

## 2014-03-27 NOTE — Telephone Encounter (Signed)
Rx for Ribavirin given to Karla Young

## 2014-03-28 ENCOUNTER — Telehealth (INDEPENDENT_AMBULATORY_CARE_PROVIDER_SITE_OTHER): Payer: Self-pay | Admitting: *Deleted

## 2014-03-28 NOTE — Telephone Encounter (Signed)
A call to the insurance /Pharmacy @ [phone] was made. This was to verify that the Ribavirin prescription had been recd'. I spoke with Tiffany who verified that both the Viekira/Ribavirin had been recd'. They have had trouble getting in contact with the patient. As they continue they ask that I try and provide to her this number to call [phone], so they could arrange shipment of the medication. Patient was called and a message with this information was left on her voicemail.

## 2014-04-01 ENCOUNTER — Other Ambulatory Visit (INDEPENDENT_AMBULATORY_CARE_PROVIDER_SITE_OTHER): Payer: Self-pay | Admitting: *Deleted

## 2014-04-01 ENCOUNTER — Telehealth (INDEPENDENT_AMBULATORY_CARE_PROVIDER_SITE_OTHER): Payer: Self-pay | Admitting: *Deleted

## 2014-04-01 ENCOUNTER — Encounter (INDEPENDENT_AMBULATORY_CARE_PROVIDER_SITE_OTHER): Payer: Self-pay | Admitting: *Deleted

## 2014-04-01 DIAGNOSIS — B182 Chronic viral hepatitis C: Secondary | ICD-10-CM

## 2014-04-01 NOTE — Telephone Encounter (Signed)
Karla Young has received her Hep C Meds. The return phone number is [phone].

## 2014-04-01 NOTE — Telephone Encounter (Signed)
Above noted 

## 2014-04-01 NOTE — Telephone Encounter (Signed)
She will start the Viekira and Ribavirin tomorrow. 04/02/2014. OV in 4 weeks.      Lupita Leash, OV in 4 weeks.   Tammy, Hepatic function and CBC with Diff in 4 weeks.

## 2014-04-01 NOTE — Telephone Encounter (Signed)
Per Dr.Rehman the patient will need to have labs drawn in 4 weeks. Hepatic Function , CBC/Diff

## 2014-04-01 NOTE — Telephone Encounter (Signed)
Lab is noted for 04/29/14.

## 2014-04-01 NOTE — Telephone Encounter (Signed)
Ribavirin  day.

## 2014-04-02 NOTE — Telephone Encounter (Signed)
Apt has been scheduled for 05/01/14 at 3:30 pm with Dorene Ar, NP.

## 2014-04-02 NOTE — Telephone Encounter (Signed)
LM for patient to return the call to schedule her 4 week f/u apt.

## 2014-04-29 ENCOUNTER — Other Ambulatory Visit (INDEPENDENT_AMBULATORY_CARE_PROVIDER_SITE_OTHER): Payer: Self-pay | Admitting: Internal Medicine

## 2014-04-30 LAB — CBC WITH DIFFERENTIAL/PLATELET
Basophils Absolute: 0.1 10*3/uL (ref 0.0–0.1)
Basophils Relative: 1 % (ref 0–1)
Eosinophils Absolute: 0.2 10*3/uL (ref 0.0–0.7)
Eosinophils Relative: 2 % (ref 0–5)
HCT: 28.2 % — ABNORMAL LOW (ref 36.0–46.0)
HEMOGLOBIN: 9.7 g/dL — AB (ref 12.0–15.0)
LYMPHS ABS: 2.9 10*3/uL (ref 0.7–4.0)
LYMPHS PCT: 34 % (ref 12–46)
MCH: 30.2 pg (ref 26.0–34.0)
MCHC: 34.4 g/dL (ref 30.0–36.0)
MCV: 87.9 fL (ref 78.0–100.0)
Monocytes Absolute: 0.7 10*3/uL (ref 0.1–1.0)
Monocytes Relative: 8 % (ref 3–12)
NEUTROS ABS: 4.7 10*3/uL (ref 1.7–7.7)
NEUTROS PCT: 55 % (ref 43–77)
PLATELETS: 196 10*3/uL (ref 150–400)
RBC: 3.21 MIL/uL — AB (ref 3.87–5.11)
RDW: 15.6 % — ABNORMAL HIGH (ref 11.5–15.5)
WBC: 8.5 10*3/uL (ref 4.0–10.5)

## 2014-04-30 LAB — HEPATIC FUNCTION PANEL
ALBUMIN: 4 g/dL (ref 3.5–5.2)
ALK PHOS: 72 U/L (ref 39–117)
ALT: 24 U/L (ref 0–35)
AST: 31 U/L (ref 0–37)
BILIRUBIN DIRECT: 0.8 mg/dL — AB (ref 0.0–0.3)
BILIRUBIN INDIRECT: 2.3 mg/dL — AB (ref 0.2–1.2)
BILIRUBIN TOTAL: 3.1 mg/dL — AB (ref 0.2–1.2)
Total Protein: 6.5 g/dL (ref 6.0–8.3)

## 2014-05-01 ENCOUNTER — Ambulatory Visit (INDEPENDENT_AMBULATORY_CARE_PROVIDER_SITE_OTHER): Payer: Managed Care, Other (non HMO) | Admitting: Internal Medicine

## 2014-05-01 ENCOUNTER — Encounter (INDEPENDENT_AMBULATORY_CARE_PROVIDER_SITE_OTHER): Payer: Self-pay | Admitting: Internal Medicine

## 2014-05-01 VITALS — BP 116/68 | HR 72 | Temp 98.4°F | Ht 64.0 in | Wt 186.4 lb

## 2014-05-01 DIAGNOSIS — B182 Chronic viral hepatitis C: Secondary | ICD-10-CM

## 2014-05-01 NOTE — Patient Instructions (Addendum)
Reduce the Ribavirin to . OV in 4 weeks.  CBC in one week. CBC in 4 weeks, and a Hepatic function

## 2014-05-01 NOTE — Progress Notes (Signed)
Subjective:    Patient ID: Karla Young, female    DOB: [birth year], 53 y.o.   MRN: [id number]  HPI Here today for f/u of her Hepatitis C. She is Genotype 1A She started treatment with Heloise Purpura and Ribavirin 1200 mg 04/02/2014. She will either received 12 weeks or 24 weeks of therapy. She has had treatment in the past but developed MRSA and treatment was stopped.  She has had some nausea.  She has had some diarrhea.  She is alternating between diarrhea and constipation.  She has not lost any weight. Appetite is good.  No abdominal pain. No joint pain or rash.         No blood transfusion. She has 4 tattoos received in 1999 and 2000. No IV drug use. She has smoke marijuana in the past.  She has received treatment in the past by Dr. Karilyn Cota. She apparently had treatment failure.  She was treated with Ribavirin and Inteferon in 2002. From old records, she responded to  She actually started tx while Dr. Karilyn Cota was in [place] but she developed MRSA and tx was stopped.     CBC    Component Value Date/Time   WBC 8.5 04/29/2014 1630   RBC 3.21* 04/29/2014 1630   HGB 9.7* 04/29/2014 1630   HCT 28.2* 04/29/2014 1630   PLT 196 04/29/2014 1630   MCV 87.9 04/29/2014 1630   MCH 30.2 04/29/2014 1630   MCHC 34.4 04/29/2014 1630   RDW 15.6* 04/29/2014 1630   LYMPHSABS 2.9 04/29/2014 1630   MONOABS 0.7 04/29/2014 1630   EOSABS 0.2 04/29/2014 1630   BASOSABS 0.1 04/29/2014 1630       CBC Latest Ref Rng 04/29/2014 02/12/2014 09/08/2007  WBC 4.0 - 10.5 K/uL 8.5 9.5 5.8  Hemoglobin 12.0 - 15.0 g/dL 4.6(N) 15.3(H) 11.4(L)  Hematocrit 36.0 - 46.0 % 28.2(L) 43.7 32.8(L)  Platelets 150 - 400 K/uL 196 190 210   Hepatic Function Panel     Component Value Date/Time   PROT 6.5 04/29/2014 1630   ALBUMIN 4.0 04/29/2014 1630   AST 31 04/29/2014 1630   ALT 24 04/29/2014 1630   ALKPHOS 72 04/29/2014 1630   BILITOT 3.1* 04/29/2014 1630   BILIDIR 0.8* 04/29/2014 1630   IBILI 2.3* 04/29/2014 1630       Review of  Systems Past Medical History  Diagnosis Date  . Hepatitis C   . MRSA (methicillin resistant staph aureus) culture positive   . Hypothyroid   . Hemochromatosis   . Migraines     Past Surgical History  Procedure Laterality Date  . Cholecystectomy    . Partial hysterectomy       One ovary left  . Back surgery      No Known Allergies  Current Outpatient Prescriptions on File Prior to Visit  Medication Sig Dispense Refill  . ALPRAZolam (XANAX) 1 MG tablet Take 1 mg by mouth 3 (three) times daily.      Marland Kitchen levothyroxine (SYNTHROID, LEVOTHROID) 100 MCG tablet Take 100 mcg by mouth daily before breakfast.      . omeprazole (PRILOSEC) 40 MG capsule Take 40 mg by mouth daily.      . SUMAtriptan (IMITREX) 100 MG tablet Take 100 mg by mouth every 2 (two) hours as needed for migraine or headache. May repeat in 2 hours if headache persists or recurs.       No current facility-administered medications on file prior to visit.        Objective:  Physical Exam  Filed Vitals:   05/01/14 1540  BP: 116/68  Pulse: 72  Temp: 98.4 F (36.9 C)  Height:  (1.626 m)  Weight: 186 lb 6.4 oz (84.55 kg)   Alert and oriented. Skin warm and dry. Oral mucosa is moist.   . Sclera anicteric, conjunctivae is pink. Thyroid not enlarged. No cervical lymphadenopathy. Lungs clear. Heart regular rate and rhythm.  Abdomen is soft. Bowel sounds are positive. No hepatomegaly. No abdominal masses felt. No tenderness.  No edema to lower extremities.         Assessment & Plan:  Hepatitis C.  Slight rise in her bilirubin. Drop in her Hemoglobin. I discussed with Dr. Karilyn Cota. Ribavirin down to .  CBC with diff next week OV in 4 weeks with a CBC, hepatic profile and hep c quaint.

## 2014-05-02 ENCOUNTER — Telehealth (INDEPENDENT_AMBULATORY_CARE_PROVIDER_SITE_OTHER): Payer: Self-pay | Admitting: *Deleted

## 2014-05-02 DIAGNOSIS — B182 Chronic viral hepatitis C: Secondary | ICD-10-CM

## 2014-05-02 LAB — HEPATITIS C RNA QUANTITATIVE: HCV Quantitative: NOT DETECTED IU/mL (ref <15)

## 2014-05-02 NOTE — Telephone Encounter (Signed)
.  Per Sandi Mariscal will have CBC/Diff in 1 week. Then the patient will have CBC/Diff , Hepatic Function in 4 weeks.

## 2014-05-02 NOTE — Addendum Note (Signed)
Addended by: Shona Needles on: 05/02/2014 05:16 PM   Modules accepted: Orders

## 2014-05-03 ENCOUNTER — Other Ambulatory Visit (INDEPENDENT_AMBULATORY_CARE_PROVIDER_SITE_OTHER): Payer: Self-pay | Admitting: *Deleted

## 2014-05-03 ENCOUNTER — Encounter (INDEPENDENT_AMBULATORY_CARE_PROVIDER_SITE_OTHER): Payer: Self-pay | Admitting: *Deleted

## 2014-05-03 DIAGNOSIS — B182 Chronic viral hepatitis C: Secondary | ICD-10-CM

## 2014-05-11 LAB — CBC WITH DIFFERENTIAL/PLATELET
Basophils Absolute: 0.1 10*3/uL (ref 0.0–0.1)
Basophils Relative: 1 % (ref 0–1)
EOS PCT: 4 % (ref 0–5)
Eosinophils Absolute: 0.3 10*3/uL (ref 0.0–0.7)
HEMATOCRIT: 32.8 % — AB (ref 36.0–46.0)
HEMOGLOBIN: 10.6 g/dL — AB (ref 12.0–15.0)
LYMPHS ABS: 1.8 10*3/uL (ref 0.7–4.0)
LYMPHS PCT: 27 % (ref 12–46)
MCH: 29.9 pg (ref 26.0–34.0)
MCHC: 32.3 g/dL (ref 30.0–36.0)
MCV: 92.4 fL (ref 78.0–100.0)
Monocytes Absolute: 0.7 10*3/uL (ref 0.1–1.0)
Monocytes Relative: 10 % (ref 3–12)
Neutro Abs: 3.8 10*3/uL (ref 1.7–7.7)
Neutrophils Relative %: 58 % (ref 43–77)
PLATELETS: 176 10*3/uL (ref 150–400)
RBC: 3.55 MIL/uL — AB (ref 3.87–5.11)
RDW: 15.2 % (ref 11.5–15.5)
WBC: 6.6 10*3/uL (ref 4.0–10.5)

## 2014-05-11 LAB — HEPATIC FUNCTION PANEL
ALK PHOS: 76 U/L (ref 39–117)
ALT: 22 U/L (ref 0–35)
AST: 25 U/L (ref 0–37)
Albumin: 3.9 g/dL (ref 3.5–5.2)
BILIRUBIN INDIRECT: 1.4 mg/dL — AB (ref 0.2–1.2)
Bilirubin, Direct: 0.4 mg/dL — ABNORMAL HIGH (ref 0.0–0.3)
TOTAL PROTEIN: 6.5 g/dL (ref 6.0–8.3)
Total Bilirubin: 1.8 mg/dL — ABNORMAL HIGH (ref 0.2–1.2)

## 2014-05-13 ENCOUNTER — Other Ambulatory Visit (INDEPENDENT_AMBULATORY_CARE_PROVIDER_SITE_OTHER): Payer: Self-pay | Admitting: *Deleted

## 2014-05-13 ENCOUNTER — Encounter (INDEPENDENT_AMBULATORY_CARE_PROVIDER_SITE_OTHER): Payer: Self-pay | Admitting: *Deleted

## 2014-05-13 ENCOUNTER — Telehealth (INDEPENDENT_AMBULATORY_CARE_PROVIDER_SITE_OTHER): Payer: Self-pay | Admitting: *Deleted

## 2014-05-13 DIAGNOSIS — R197 Diarrhea, unspecified: Secondary | ICD-10-CM

## 2014-05-13 DIAGNOSIS — B182 Chronic viral hepatitis C: Secondary | ICD-10-CM

## 2014-05-13 MED ORDER — DICYCLOMINE HCL 10 MG PO CAPS: 10.0000 mg | ORAL_CAPSULE | Freq: Three times a day (TID) | ORAL | Status: DC

## 2014-05-13 NOTE — Telephone Encounter (Signed)
Karla Young is needing a [facility] faxed [facility] stating she is being treated for Hep C. Please make sure when she started and how long the treatment will be. Asked for more information, like who and who this is for? "Doesn't feel the need to explain who or what this is for." Karla Young's return phone number is [phone].

## 2014-05-13 NOTE — Telephone Encounter (Signed)
Says she has been having diarrhea. Has been taking Imodium which has not helped.

## 2014-05-13 NOTE — Telephone Encounter (Signed)
.  Per Terri Setzer,NP patient to have lab work in 3 weeks. 

## 2014-05-15 ENCOUNTER — Ambulatory Visit (INDEPENDENT_AMBULATORY_CARE_PROVIDER_SITE_OTHER): Payer: Managed Care, Other (non HMO) | Admitting: Internal Medicine

## 2014-05-15 LAB — HEPATITIS C RNA QUANTITATIVE: HCV Quantitative: NOT DETECTED IU/mL (ref <15)

## 2014-05-16 ENCOUNTER — Ambulatory Visit (INDEPENDENT_AMBULATORY_CARE_PROVIDER_SITE_OTHER): Payer: Managed Care, Other (non HMO) | Admitting: Internal Medicine

## 2014-05-16 ENCOUNTER — Telehealth (INDEPENDENT_AMBULATORY_CARE_PROVIDER_SITE_OTHER): Payer: Self-pay | Admitting: Internal Medicine

## 2014-05-16 DIAGNOSIS — R21 Rash and other nonspecific skin eruption: Secondary | ICD-10-CM

## 2014-05-16 MED ORDER — NYSTATIN-TRIAMCINOLONE 100000-0.1 UNIT/GM-% EX CREA: 1.0000 "application " | TOPICAL_CREAM | Freq: Two times a day (BID) | CUTANEOUS | Status: DC

## 2014-05-16 NOTE — Telephone Encounter (Signed)
States she has a rash under her rt breast, blisters in her mouth, and rash to her ankles. Says has had x 10 days.  Refuses to come in today for OV.

## 2014-05-16 NOTE — Telephone Encounter (Signed)
Patient to have OV tomorrow. If she is a no show, we are going to cancel her treatment.

## 2014-05-17 ENCOUNTER — Telehealth (INDEPENDENT_AMBULATORY_CARE_PROVIDER_SITE_OTHER): Payer: Self-pay | Admitting: Internal Medicine

## 2014-05-17 ENCOUNTER — Encounter (INDEPENDENT_AMBULATORY_CARE_PROVIDER_SITE_OTHER): Payer: Managed Care, Other (non HMO) | Admitting: Internal Medicine

## 2014-05-17 NOTE — Telephone Encounter (Signed)
Patient was a no show at OV today. I have called [facility] and hopefully have stopped the next shipment of drugs. I feel patient needs to discontinue treatment and I told her this yesterday. She has a rash to both ankle area, ? Sores in her mouth, rash under breast and perineal area. Dr. Karilyn Cotaehman is aware.

## 2014-05-20 ENCOUNTER — Telehealth (INDEPENDENT_AMBULATORY_CARE_PROVIDER_SITE_OTHER): Payer: Self-pay | Admitting: *Deleted

## 2014-05-20 NOTE — Telephone Encounter (Signed)
noted 

## 2014-05-20 NOTE — Telephone Encounter (Signed)
Would like for Karla Young to please return her call at [phone]. Her rash is gone.

## 2014-05-22 ENCOUNTER — Encounter (INDEPENDENT_AMBULATORY_CARE_PROVIDER_SITE_OTHER): Payer: Self-pay | Admitting: Internal Medicine

## 2014-05-22 ENCOUNTER — Ambulatory Visit (INDEPENDENT_AMBULATORY_CARE_PROVIDER_SITE_OTHER): Payer: Managed Care, Other (non HMO) | Admitting: Internal Medicine

## 2014-05-22 VITALS — BP 134/78 | HR 80 | Temp 98.3°F | Ht 64.0 in | Wt 186.0 lb

## 2014-05-22 DIAGNOSIS — B182 Chronic viral hepatitis C: Secondary | ICD-10-CM

## 2014-05-22 LAB — CBC WITH DIFFERENTIAL/PLATELET
Basophils Absolute: 0 10*3/uL (ref 0.0–0.1)
Basophils Relative: 0 % (ref 0–1)
EOS PCT: 1 % (ref 0–5)
Eosinophils Absolute: 0.1 10*3/uL (ref 0.0–0.7)
HEMATOCRIT: 36 % (ref 36.0–46.0)
Hemoglobin: 12 g/dL (ref 12.0–15.0)
LYMPHS ABS: 1.1 10*3/uL (ref 0.7–4.0)
LYMPHS PCT: 12 % (ref 12–46)
MCH: 31.1 pg (ref 26.0–34.0)
MCHC: 33.3 g/dL (ref 30.0–36.0)
MCV: 93.3 fL (ref 78.0–100.0)
MONO ABS: 0.8 10*3/uL (ref 0.1–1.0)
MONOS PCT: 8 % (ref 3–12)
Neutro Abs: 7.4 10*3/uL (ref 1.7–7.7)
Neutrophils Relative %: 79 % — ABNORMAL HIGH (ref 43–77)
Platelets: 155 10*3/uL (ref 150–400)
RBC: 3.86 MIL/uL — AB (ref 3.87–5.11)
RDW: 13.6 % (ref 11.5–15.5)
WBC: 9.4 10*3/uL (ref 4.0–10.5)

## 2014-05-22 LAB — HEPATIC FUNCTION PANEL
ALK PHOS: 79 U/L (ref 39–117)
ALT: 17 U/L (ref 0–35)
AST: 22 U/L (ref 0–37)
Albumin: 3.9 g/dL (ref 3.5–5.2)
BILIRUBIN INDIRECT: 0.9 mg/dL (ref 0.2–1.2)
Bilirubin, Direct: 0.3 mg/dL (ref 0.0–0.3)
TOTAL PROTEIN: 6.9 g/dL (ref 6.0–8.3)
Total Bilirubin: 1.2 mg/dL (ref 0.2–1.2)

## 2014-05-22 NOTE — Patient Instructions (Signed)
OV 4 weeks. If you develop a rash, call our office.-

## 2014-05-22 NOTE — Progress Notes (Signed)
Subjective:    Patient ID: Karla Young, female    DOB: 02/15/1961, 53 y.o.   MRN: [id number]  HPI   HPI Here today for f/u of her Hepatitis C. She is Genotype 1A  She started treatment with Linzie Collin and Ribavirin 1200 mg 04/02/2014. She cleared the virus at weak four. Viral load undetectable. Ribavirin dropped to $RemoveBe'800mg'SBdMpvcFe$  when she had a drop in her hemoglobin from 15.3 to 9.7.   She developed a rash under her breast and perioneal area last week. Then she developed a rash to her lower extremities and she complained of possible having sores in her mouth.  She sent pictures via cell phone. A medrol pack and Mycolog cream 11 was called to her pharmacy but she apparently did not pick this up as I instructed her to. I asked patient to come in for OV last week x 2 and she missed both appts.   Her treatment will stop at week 8. Dr. Laural Golden is aware.  She says she is doing good. Appetite is good. No rashes now. She says she has joint pain, but joint pain is chronic.  She has a BM three times a day and are formed. No melena or BRRB  Hepatic Function Panel     Component Value Date/Time   PROT 6.5 05/10/2014 1632   ALBUMIN 3.9 05/10/2014 1632   AST 25 05/10/2014 1632   ALT 22 05/10/2014 1632   ALKPHOS 76 05/10/2014 1632   BILITOT 1.8* 05/10/2014 1632   BILIDIR 0.4* 05/10/2014 1632   IBILI 1.4* 05/10/2014 1632      CBC Latest Ref Rng 05/10/2014 04/29/2014 02/12/2014  WBC 4.0 - 10.5 K/uL 6.6 8.5 9.5  Hemoglobin 12.0 - 15.0 g/dL 10.6(L) 9.7(L) 15.3(H)  Hematocrit 36.0 - 46.0 % 32.8(L) 28.2(L) 43.7  Platelets 150 - 400 K/uL 176 196 190    CMP     Component Value Date/Time   NA 140 02/12/2014 1210   K 3.8 02/12/2014 1210   CL 103 02/12/2014 1210   CO2 26 02/12/2014 1210   GLUCOSE 95 02/12/2014 1210   BUN 8 02/12/2014 1210   CREATININE 0.81 02/12/2014 1210   CREATININE 0.58 09/08/2007 0540   CALCIUM 9.9 02/12/2014 1210   PROT 6.5 05/10/2014 1632   ALBUMIN 3.9 05/10/2014 1632   AST 25 05/10/2014 1632   ALT 22 05/10/2014  1632   ALKPHOS 76 05/10/2014 1632   BILITOT 1.8* 05/10/2014 1632   GFRNONAA >60 09/08/2007 0540   GFRAA  Value: >60        The eGFR has been calculated using the MDRD equation. This calculation has not been validated in all clinical 09/08/2007 0540          Review of Systems Past Medical History  Diagnosis Date  . Hepatitis C   . MRSA (methicillin resistant staph aureus) culture positive   . Hypothyroid   . Hemochromatosis   . Migraines     Past Surgical History  Procedure Laterality Date  . Cholecystectomy    . Partial hysterectomy       One ovary left  . Back surgery      No Known Allergies  Current Outpatient Prescriptions on File Prior to Visit  Medication Sig Dispense Refill  . ALPRAZolam (XANAX) 1 MG tablet Take 1 mg by mouth as needed.       Marland Kitchen levothyroxine (SYNTHROID, LEVOTHROID) 100 MCG tablet Take 100 mcg by mouth daily before breakfast.      . Ombitas-Paritapre-Ritona-Dasab Linzie Collin  PAK PO) Take by mouth.      Marland Kitchen omeprazole (PRILOSEC) 40 MG capsule Take 40 mg by mouth daily.      . ribavirin (REBETOL) 200 MG capsule Take 800 mg by mouth 2 (two) times daily. Ribavirin was reduced at today visit due to a drop in her hemoglobin      . SUMAtriptan (IMITREX) 100 MG tablet Take 100 mg by mouth every 2 (two) hours as needed for migraine or headache. May repeat in 2 hours if headache persists or recurs.      . dicyclomine (BENTYL) 10 MG capsule Take 1 capsule (10 mg total) by mouth 3 (three) times daily before meals.  90 capsule  0  . nystatin-triamcinolone (MYCOLOG II) cream Apply 1 application topically 2 (two) times daily.  30 g  0   No current facility-administered medications on file prior to visit.        Objective:   Physical Exam Filed Vitals:   05/22/14 1002  BP: 134/78  Pulse: 80  Temp: 98.3 F (36.8 C)  Height: $Remove'5\' 4"'HNHXceH$  (1.626 m)  Weight: 186 lb (84.369 kg)  Alert and oriented. Skin warm and dry. Oral mucosa is moist.   . Sclera anicteric, conjunctivae  is pink. Thyroid not enlarged. No cervical lymphadenopathy. Lungs clear. Heart regular rate and rhythm.  Abdomen is soft. Bowel sounds are positive. No hepatomegaly. No abdominal masses felt. No tenderness.  No edema to lower extremities.  There is no skin rash to lower extremities. No rash under breasts.        Assessment & Plan:  Hep C. She cleared the virus at week 4. Sh did have a drop in her hemoglobin and the Ribavirin reduced to 800 mg a day. CBC. Hepatic function today.  OV in 4 weeks.

## 2014-05-30 ENCOUNTER — Ambulatory Visit (INDEPENDENT_AMBULATORY_CARE_PROVIDER_SITE_OTHER): Payer: Managed Care, Other (non HMO) | Admitting: Internal Medicine

## 2014-06-10 NOTE — Progress Notes (Signed)
error    This encounter was created in error - please disregard.

## 2014-06-19 ENCOUNTER — Ambulatory Visit (INDEPENDENT_AMBULATORY_CARE_PROVIDER_SITE_OTHER): Payer: Managed Care, Other (non HMO) | Admitting: Internal Medicine

## 2014-06-20 ENCOUNTER — Telehealth (INDEPENDENT_AMBULATORY_CARE_PROVIDER_SITE_OTHER): Payer: Self-pay | Admitting: Internal Medicine

## 2014-06-20 NOTE — Telephone Encounter (Signed)
Lupita Leashonna, Please reschedule Tama HighLuAnn Auth for OV.

## 2014-06-20 NOTE — Telephone Encounter (Signed)
Caitlen wasn't available. The man that answered the phone will have her return the call.

## 2014-07-11 NOTE — Telephone Encounter (Signed)
LM for patient to return the call to get a f/u apt scheduled with our return phone number.

## 2014-07-18 ENCOUNTER — Encounter (INDEPENDENT_AMBULATORY_CARE_PROVIDER_SITE_OTHER): Payer: Self-pay | Admitting: *Deleted

## 2014-07-18 NOTE — Telephone Encounter (Signed)
A f/u apt ltr has been mailed out to [facility] for 08/12/14 at 11:00 am with Dorene Arerri Setzer, NP.

## 2014-08-12 ENCOUNTER — Encounter (INDEPENDENT_AMBULATORY_CARE_PROVIDER_SITE_OTHER): Payer: Self-pay | Admitting: Internal Medicine

## 2014-08-12 ENCOUNTER — Ambulatory Visit (INDEPENDENT_AMBULATORY_CARE_PROVIDER_SITE_OTHER): Payer: Self-pay | Admitting: Internal Medicine

## 2014-08-12 VITALS — BP 132/86 | HR 76 | Temp 98.2°F | Ht 64.0 in | Wt 199.8 lb

## 2014-08-12 DIAGNOSIS — B1920 Unspecified viral hepatitis C without hepatic coma: Secondary | ICD-10-CM

## 2014-08-12 NOTE — Progress Notes (Addendum)
Subjective:    Patient ID: Karla Young, female    DOB: March 04, 1961, 54 y.o.   MRN: [id number]  HPI Here today for f/u of her Hepatitis C. She was treated years ago by had treatment failure per patient   She developed MRSA. Risk factors were tattoos.  She was last seen in October of this year. She is Genotype 1A. She started treatment with Heloise Purpura and Ribavirin 1200mg  on 04/02/2014. She cleared the virus at week 4 . Her Ribavirin was dropped to 800mg  when she had a drop in her hemoglobin from 15.3 to 9.7.  She also had developed a rash under her breast and peritoneal area which did clear.  A medrol pack and Mycolog cream 11 was called but she never picked up.  She did miss two appts and her treatment was stopped at week 8 and Dr. 04/04/2014 was aware.  She tells me she is doing good.  She stays home. She is not really active. She has fibromyalgia which limits here. She had had weight gain. She has gained 14 pounds since her last visit in October. She says she is alternating between constipation and diarrhea. The diarrhea started while she was taking the V[place]ira (worse).  This has been going on for years. She is having a BM daily and then if she has diarrhea she will have 4 stools a day. No BRRB or melena. Patient is requesting a colonoscopy and an EGD.  She says she has reflux which is terrible. She says something is not right for over a year. She says Karilyn Cota foods bother her and she knows this.  She takes ? PPI for her acid reflux. She says acid reflux will come up in her esophagus and will burn at times.  She has acid reflux about three times a week.  She had an EGD in 2003 which was normal.   CBC Latest Ref Rng 05/22/2014 05/10/2014 04/29/2014  WBC 4.0 - 10.5 K/uL 9.4 6.6 8.5  Hemoglobin 12.0 - 15.0 g/dL 07/10/2014 10.6(L) 9.7(L)  Hematocrit 36.0 - 46.0 % 36.0 32.8(L) 28.2(L)  Platelets 150 - 400 K/uL 155 176 196   05/22/2014 Hep C. Quaint: undetectable.    CMP     Component Value Date/Time   NA  140 02/12/2014 1210   K 3.8 02/12/2014 1210   CL 103 02/12/2014 1210   CO2 26 02/12/2014 1210   GLUCOSE 95 02/12/2014 1210   BUN 8 02/12/2014 1210   CREATININE 0.81 02/12/2014 1210   CREATININE 0.58 09/08/2007 0540   CALCIUM 9.9 02/12/2014 1210   PROT 6.9 05/22/2014 1028   ALBUMIN 3.9 05/22/2014 1028   AST 22 05/22/2014 1028   ALT 17 05/22/2014 1028   ALKPHOS 79 05/22/2014 1028   BILITOT 1.2 05/22/2014 1028   GFRNONAA >60 09/08/2007 0540   GFRAA  09/08/2007 0540    >60        The eGFR has been calculated using the MDRD equation. This calculation has not been validated in all clinical  02/2014 09/10/2007 elastrography:  Hepatic Segment: 8  Number of measurements: 4  Median velocity: 4.32 m/sec  Corresponding Metavir fibrosis score: F4         Review of Systems Past Medical History  Diagnosis Date  . Hepatitis C   . MRSA (methicillin resistant staph aureus) culture positive   . Hypothyroid   . Migraines     Past Surgical History  Procedure Laterality Date  . Cholecystectomy    . Partial hysterectomy  One ovary left  . Back surgery      No Known Allergies  Current Outpatient Prescriptions on File Prior to Visit  Medication Sig Dispense Refill  . ALPRAZolam (XANAX) 1 MG tablet Take 1 mg by mouth as needed.     Marland Kitchen levothyroxine (SYNTHROID, LEVOTHROID) 100 MCG tablet Take 100 mcg by mouth daily before breakfast.    . omeprazole (PRILOSEC) 40 MG capsule Take 40 mg by mouth daily.    . SUMAtriptan (IMITREX) 100 MG tablet Take 100 mg by mouth every 2 (two) hours as needed for migraine or headache. May repeat in 2 hours if headache persists or recurs.    . dicyclomine (BENTYL) 10 MG capsule Take 1 capsule (10 mg total) by mouth 3 (three) times daily before meals. (Patient not taking: Reported on 08/12/2014) 90 capsule 0  . nystatin-triamcinolone (MYCOLOG II) cream Apply 1 application topically 2 (two) times daily. (Patient not taking: Reported on 08/12/2014) 30 g  0   No current facility-administered medications on file prior to visit.        Objective:   Physical Exam  Filed Vitals:   08/12/14 1452  Height: $Remove'5\' 4"'ZRoPMYu$  (1.626 m)  Weight: 199 lb 12.8 oz (90.629 kg)  Alert and oriented. Skin warm and dry. Oral mucosa is moist.   . Sclera anicteric, conjunctivae is pink. Thyroid not enlarged. No cervical lymphadenopathy. Lungs clear. Heart regular rate and rhythm.  Abdomen is soft. Bowel sounds are positive. No hepatomegaly. No abdominal masses felt. No tenderness.  No edema to lower extremities.          Assessment & Plan:  Hepatitis C. She has cleared the virus. She is doing well.  Will get follow up labs and [place]. Change in her stool/ alternating between diarrhea and constipation,. GERD not controlled at this time.  Take Prilosec BID for now until you have the EGD.

## 2014-08-12 NOTE — Patient Instructions (Addendum)
Labs today and [place] RUQ will be scheduled. OV in 6 months With Hep C quaint, AFP, Hepatic function, CBC EGD/Colonoscopy.

## 2014-08-13 ENCOUNTER — Telehealth (INDEPENDENT_AMBULATORY_CARE_PROVIDER_SITE_OTHER): Payer: Self-pay | Admitting: *Deleted

## 2014-08-13 ENCOUNTER — Other Ambulatory Visit (INDEPENDENT_AMBULATORY_CARE_PROVIDER_SITE_OTHER): Payer: Self-pay | Admitting: *Deleted

## 2014-08-13 DIAGNOSIS — Z1211 Encounter for screening for malignant neoplasm of colon: Secondary | ICD-10-CM

## 2014-08-13 DIAGNOSIS — R195 Other fecal abnormalities: Secondary | ICD-10-CM

## 2014-08-13 DIAGNOSIS — K219 Gastro-esophageal reflux disease without esophagitis: Secondary | ICD-10-CM

## 2014-08-13 NOTE — Telephone Encounter (Signed)
Patient needs trilyte 

## 2014-08-14 ENCOUNTER — Telehealth (INDEPENDENT_AMBULATORY_CARE_PROVIDER_SITE_OTHER): Payer: Self-pay | Admitting: *Deleted

## 2014-08-14 DIAGNOSIS — B182 Chronic viral hepatitis C: Secondary | ICD-10-CM

## 2014-08-14 NOTE — Telephone Encounter (Signed)
.  Per Delrae Renderri Setzer,NP patient is to have lab work drawn in 6 months.

## 2014-08-15 ENCOUNTER — Ambulatory Visit (HOSPITAL_COMMUNITY): Payer: Managed Care, Other (non HMO)

## 2014-08-16 MED ORDER — PEG 3350-KCL-NA BICARB-NACL 420 G PO SOLR
4000.0000 mL | Freq: Once | ORAL | Status: DC
Start: 2014-08-16 — End: 2014-09-06

## 2014-08-21 ENCOUNTER — Ambulatory Visit (HOSPITAL_COMMUNITY)
Admission: RE | Admit: 2014-08-21 | Discharge: 2014-08-21 | Disposition: A | Discharge status: Home / Self Care | Payer: Managed Care, Other (non HMO) | Source: Ambulatory Visit | Attending: Internal Medicine | Admitting: Internal Medicine

## 2014-08-21 ENCOUNTER — Ambulatory Visit (HOSPITAL_COMMUNITY): Payer: Managed Care, Other (non HMO)

## 2014-08-21 DIAGNOSIS — K7460 Unspecified cirrhosis of liver: Secondary | ICD-10-CM | POA: Insufficient documentation

## 2014-08-21 DIAGNOSIS — B1920 Unspecified viral hepatitis C without hepatic coma: Secondary | ICD-10-CM | POA: Diagnosis present

## 2014-08-26 ENCOUNTER — Other Ambulatory Visit: Payer: Self-pay

## 2014-08-26 ENCOUNTER — Encounter (HOSPITAL_COMMUNITY)
Admission: RE | Admit: 2014-08-26 | Discharge: 2014-08-26 | Disposition: A | Discharge status: Home / Self Care | Payer: Managed Care, Other (non HMO) | Source: Ambulatory Visit | Attending: Internal Medicine | Admitting: Internal Medicine

## 2014-08-26 ENCOUNTER — Encounter (HOSPITAL_COMMUNITY): Payer: Self-pay

## 2014-08-26 VITALS — BP 165/85 | HR 72 | Temp 98.2°F | Resp 20 | Ht 64.0 in | Wt 191.0 lb

## 2014-08-26 DIAGNOSIS — R888 Abnormal findings in other body fluids and substances: Secondary | ICD-10-CM | POA: Diagnosis not present

## 2014-08-26 DIAGNOSIS — Z01810 Encounter for preprocedural cardiovascular examination: Secondary | ICD-10-CM | POA: Diagnosis not present

## 2014-08-26 DIAGNOSIS — R195 Other fecal abnormalities: Secondary | ICD-10-CM

## 2014-08-26 DIAGNOSIS — Z01818 Encounter for other preprocedural examination: Secondary | ICD-10-CM | POA: Diagnosis not present

## 2014-08-26 DIAGNOSIS — Z01812 Encounter for preprocedural laboratory examination: Secondary | ICD-10-CM | POA: Diagnosis present

## 2014-08-26 DIAGNOSIS — K219 Gastro-esophageal reflux disease without esophagitis: Secondary | ICD-10-CM | POA: Diagnosis not present

## 2014-08-26 HISTORY — DX: Anxiety disorder, unspecified: F41.9

## 2014-08-26 HISTORY — DX: Essential (primary) hypertension: I10

## 2014-08-26 HISTORY — DX: Fibromyalgia: M79.7

## 2014-08-26 HISTORY — DX: Gastro-esophageal reflux disease without esophagitis: K21.9

## 2014-08-26 LAB — CBC
HCT: 44.7 % (ref 36.0–46.0)
Hemoglobin: 14.5 g/dL (ref 12.0–15.0)
MCH: 27.8 pg (ref 26.0–34.0)
MCHC: 32.4 g/dL (ref 30.0–36.0)
MCV: 85.6 fL (ref 78.0–100.0)
PLATELETS: 192 10*3/uL (ref 150–400)
RBC: 5.22 MIL/uL — ABNORMAL HIGH (ref 3.87–5.11)
RDW: 13 % (ref 11.5–15.5)
WBC: 9 10*3/uL (ref 4.0–10.5)

## 2014-08-26 LAB — BASIC METABOLIC PANEL
Anion gap: 7 (ref 5–15)
BUN: 13 mg/dL (ref 6–23)
CO2: 24 mmol/L (ref 19–32)
Calcium: 9.5 mg/dL (ref 8.4–10.5)
Chloride: 106 mEq/L (ref 96–112)
Creatinine, Ser: 0.86 mg/dL (ref 0.50–1.10)
GFR calc non Af Amer: 76 mL/min — ABNORMAL LOW (ref >90)
GFR, EST AFRICAN AMERICAN: 88 mL/min — AB (ref >90)
GLUCOSE: 91 mg/dL (ref 70–99)
POTASSIUM: 3.8 mmol/L (ref 3.5–5.1)
Sodium: 137 mmol/L (ref 135–145)

## 2014-08-26 NOTE — Pre-Procedure Instructions (Signed)
Patient given information to sign up for my chart at home. 

## 2014-08-26 NOTE — Patient Instructions (Signed)
Tama HighLuann Turnbough  08/26/2014   Your procedure is scheduled on:   08/30/2014  Report to [facility] at  615  AM.  Call this number if you have problems the morning of surgery: [phone]   Remember:   Do not eat food or drink liquids after midnight.   Take these medicines the morning of surgery with A SIP OF WATER:  Xanax, levothyroxine, lisinopril, lyrica, prilosec, oxycodone   Do not wear jewelry, make-up or nail polish.  Do not wear lotions, powders, or perfumes.   Do not shave 48 hours prior to surgery. Men may shave face and neck.  Do not bring valuables to the hospital.  [facility] is not responsible for any belongings or valuables.               Contacts, dentures or bridgework may not be worn into surgery.  Leave suitcase in the car. After surgery it may be brought to your room.  For patients admitted to the hospital, discharge time is determined by your treatment team.               Patients discharged the day of surgery will not be allowed to drive home.  Name and phone number of your driver: family  Special Instructions: N/A   Please read over the following fact sheets that you were given: Pain Booklet, Coughing and Deep Breathing, Surgical Site Infection Prevention, Anesthesia Post-op Instructions and Care and Recovery After Surgery Esophagogastroduodenoscopy Esophagogastroduodenoscopy (EGD) is a procedure to examine the lining of the esophagus, stomach, and first part of the small intestine (duodenum). A long, flexible, lighted tube with a camera attached (endoscope) is inserted down the throat to view these organs. This procedure is done to detect problems or abnormalities, such as inflammation, bleeding, ulcers, or growths, in order to treat them. The procedure lasts about 5-20 minutes. It is usually an outpatient procedure, but it may need to be performed in emergency cases in the hospital. LET YOUR CAREGIVER KNOW ABOUT:   Allergies to food or  medicine.  All medicines you are taking, including vitamins, herbs, eyedrops, and over-the-counter medicines and creams.  Use of steroids (by mouth or creams).  Previous problems you or members of your family have had with the use of anesthetics.  Any blood disorders you have.  Previous surgeries you have had.  Other health problems you have.  Possibility of pregnancy, if this applies. RISKS AND COMPLICATIONS  Generally, EGD is a safe procedure. However, as with any procedure, complications can occur. Possible complications include:  Infection.  Bleeding.  Tearing (perforation) of the esophagus, stomach, or duodenum.  Difficulty breathing or not being able to breath.  Excessive sweating.  Spasms of the larynx.  Slowed heartbeat.  Low blood pressure. BEFORE THE PROCEDURE  Do not eat or drink anything for 6-8 hours before the procedure or as directed by your caregiver.  Ask your caregiver about changing or stopping your regular medicines.  If you wear dentures, be prepared to remove them before the procedure.  Arrange for someone to drive you home after the procedure. PROCEDURE   A vein will be accessed to give medicines and fluids. A medicine to relax you (sedative) and a pain reliever will be given through that access into the vein.  A numbing medicine (local anesthetic) may be sprayed on your throat for comfort and to stop you from gagging or coughing.  A mouth guard may be placed  in your mouth to protect your teeth and to keep you from biting on the endoscope.  You will be asked to lie on your left side.  The endoscope is inserted down your throat and into the esophagus, stomach, and duodenum.  Air is put through the endoscope to allow your caregiver to view the lining of your esophagus clearly.  The esophagus, stomach, and duodenum is then examined. During the exam, your caregiver may:  Remove tissue to be examined under a microscope (biopsy) for  inflammation, infection, or other medical problems.  Remove growths.  Remove objects (foreign bodies) that are stuck.  Treat any bleeding with medicines or other devices that stop tissues from bleeding (hot cautery, clipping devices).  Widen (dilate) or stretch narrowed areas of the esophagus and stomach.  The endoscope will then be withdrawn. AFTER THE PROCEDURE  You will be taken to a recovery area to be monitored. You will be able to go home once you are stable and alert.  Do not eat or drink anything until the local anesthetic and numbing medicines have worn off. You may choke.  It is normal to feel bloated, have pain with swallowing, or have a sore throat for a short time. This will wear off.  Your caregiver should be able to discuss his or her findings with you. It will take longer to discuss the test results if any biopsies were taken. Document Released: 11/26/2004 Document Revised: 12/10/2013 Document Reviewed: 06/28/2012 [facility] Patient Information 2015 [place], [place]. This information is not intended to replace advice given to you by your health care provider. Make sure you discuss any questions you have with your health care provider. Colonoscopy A colonoscopy is an exam to look at the entire large intestine (colon). This exam can help find problems such as tumors, polyps, inflammation, and areas of bleeding. The exam takes about 1 hour.  LET [facility] CARE PROVIDER KNOW ABOUT:   Any allergies you have.  All medicines you are taking, including vitamins, herbs, eye drops, creams, and over-the-counter medicines.  Previous problems you or members of your family have had with the use of anesthetics.  Any blood disorders you have.  Previous surgeries you have had.  Medical conditions you have. RISKS AND COMPLICATIONS  Generally, this is a safe procedure. However, as with any procedure, complications can occur. Possible complications include:  Bleeding.  Tearing or  rupture of the colon wall.  Reaction to medicines given during the exam.  Infection (rare). BEFORE THE PROCEDURE   Ask your health care provider about changing or stopping your regular medicines.  You may be prescribed an oral bowel prep. This involves drinking a large amount of medicated liquid, starting the day before your procedure. The liquid will cause you to have multiple loose stools until your stool is almost clear or light green. This cleans out your colon in preparation for the procedure.  Do not eat or drink anything else once you have started the bowel prep, unless your health care provider tells you it is safe to do so.  Arrange for someone to drive you home after the procedure. PROCEDURE   You will be given medicine to help you relax (sedative).  You will lie on your side with your knees bent.  A long, flexible tube with a light and camera on the end (colonoscope) will be inserted through the rectum and into the colon. The camera sends video back to a computer screen as it moves through the colon. The colonoscope  also releases carbon dioxide gas to inflate the colon. This helps your health care provider see the area better.  During the exam, your health care provider may take a small tissue sample (biopsy) to be examined under a microscope if any abnormalities are found.  The exam is finished when the entire colon has been viewed. AFTER THE PROCEDURE   Do not drive for 24 hours after the exam.  You may have a small amount of blood in your stool.  You may pass moderate amounts of gas and have mild abdominal cramping or bloating. This is caused by the gas used to inflate your colon during the exam.  Ask when your test results will be ready and how you will get your results. Make sure you get your test results. Document Released: 07/23/2000 Document Revised: 05/16/2013 Document Reviewed: 04/02/2013 [facility] Patient Information 2015 [place], [place]. This information is  not intended to replace advice given to you by your health care provider. Make sure you discuss any questions you have with your health care provider. PATIENT INSTRUCTIONS POST-ANESTHESIA  IMMEDIATELY FOLLOWING SURGERY:  Do not drive or operate machinery for the first twenty four hours after surgery.  Do not make any important decisions for twenty four hours after surgery or while taking narcotic pain medications or sedatives.  If you develop intractable nausea and vomiting or a severe headache please notify your doctor immediately.  FOLLOW-UP:  Please make an appointment with your surgeon as instructed. You do not need to follow up with anesthesia unless specifically instructed to do so.  WOUND CARE INSTRUCTIONS (if applicable):  Keep a dry clean dressing on the anesthesia/puncture wound site if there is drainage.  Once the wound has quit draining you may leave it open to air.  Generally you should leave the bandage intact for twenty four hours unless there is drainage.  If the epidural site drains for more than 36-48 hours please call the anesthesia department.  QUESTIONS?:  Please feel free to call your physician or the hospital operator if you have any questions, and they will be happy to assist you.

## 2014-08-28 ENCOUNTER — Telehealth (INDEPENDENT_AMBULATORY_CARE_PROVIDER_SITE_OTHER): Payer: Self-pay | Admitting: Internal Medicine

## 2014-08-28 NOTE — Telephone Encounter (Signed)
Needs OV in 6 months 

## 2014-08-29 ENCOUNTER — Encounter (INDEPENDENT_AMBULATORY_CARE_PROVIDER_SITE_OTHER): Payer: Self-pay

## 2014-09-04 ENCOUNTER — Encounter (HOSPITAL_COMMUNITY)
Admission: RE | Admit: 2014-09-04 | Discharge: 2014-09-04 | Disposition: A | Discharge status: Home / Self Care | Payer: Managed Care, Other (non HMO) | Source: Ambulatory Visit | Attending: Internal Medicine | Admitting: Internal Medicine

## 2014-09-04 ENCOUNTER — Encounter (INDEPENDENT_AMBULATORY_CARE_PROVIDER_SITE_OTHER): Payer: Self-pay | Admitting: *Deleted

## 2014-09-04 NOTE — Telephone Encounter (Signed)
Apt has been scheduled for 02/26/15 with Dorene Arerri Setzer, NP.

## 2014-09-05 ENCOUNTER — Encounter (HOSPITAL_COMMUNITY): Payer: Self-pay | Admitting: *Deleted

## 2014-09-06 ENCOUNTER — Encounter (HOSPITAL_COMMUNITY)
Admission: RE | Disposition: A | Discharge status: Home / Self Care | Payer: Self-pay | Source: Ambulatory Visit | Attending: Internal Medicine

## 2014-09-06 ENCOUNTER — Ambulatory Visit (HOSPITAL_COMMUNITY)
Admission: RE | Admit: 2014-09-06 | Discharge: 2014-09-06 | Disposition: A | Discharge status: Home / Self Care | Payer: Managed Care, Other (non HMO) | Source: Ambulatory Visit | Attending: Internal Medicine | Admitting: Internal Medicine

## 2014-09-06 ENCOUNTER — Ambulatory Visit (HOSPITAL_COMMUNITY): Payer: Managed Care, Other (non HMO) | Admitting: Anesthesiology

## 2014-09-06 ENCOUNTER — Encounter (HOSPITAL_COMMUNITY): Payer: Self-pay | Admitting: *Deleted

## 2014-09-06 DIAGNOSIS — I10 Essential (primary) hypertension: Secondary | ICD-10-CM | POA: Diagnosis not present

## 2014-09-06 DIAGNOSIS — K219 Gastro-esophageal reflux disease without esophagitis: Secondary | ICD-10-CM

## 2014-09-06 DIAGNOSIS — Z79899 Other long term (current) drug therapy: Secondary | ICD-10-CM | POA: Insufficient documentation

## 2014-09-06 DIAGNOSIS — K580 Irritable bowel syndrome with diarrhea: Secondary | ICD-10-CM | POA: Insufficient documentation

## 2014-09-06 DIAGNOSIS — F419 Anxiety disorder, unspecified: Secondary | ICD-10-CM | POA: Diagnosis not present

## 2014-09-06 DIAGNOSIS — E039 Hypothyroidism, unspecified: Secondary | ICD-10-CM | POA: Insufficient documentation

## 2014-09-06 DIAGNOSIS — R195 Other fecal abnormalities: Secondary | ICD-10-CM

## 2014-09-06 DIAGNOSIS — K6389 Other specified diseases of intestine: Secondary | ICD-10-CM

## 2014-09-06 DIAGNOSIS — F17210 Nicotine dependence, cigarettes, uncomplicated: Secondary | ICD-10-CM | POA: Diagnosis not present

## 2014-09-06 DIAGNOSIS — K319 Disease of stomach and duodenum, unspecified: Secondary | ICD-10-CM | POA: Insufficient documentation

## 2014-09-06 DIAGNOSIS — B1920 Unspecified viral hepatitis C without hepatic coma: Secondary | ICD-10-CM | POA: Insufficient documentation

## 2014-09-06 DIAGNOSIS — G43909 Migraine, unspecified, not intractable, without status migrainosus: Secondary | ICD-10-CM | POA: Insufficient documentation

## 2014-09-06 DIAGNOSIS — K229 Disease of esophagus, unspecified: Secondary | ICD-10-CM

## 2014-09-06 DIAGNOSIS — K3189 Other diseases of stomach and duodenum: Secondary | ICD-10-CM

## 2014-09-06 DIAGNOSIS — M797 Fibromyalgia: Secondary | ICD-10-CM | POA: Insufficient documentation

## 2014-09-06 DIAGNOSIS — R1310 Dysphagia, unspecified: Secondary | ICD-10-CM

## 2014-09-06 HISTORY — PX: COLONOSCOPY WITH PROPOFOL: SHX5780

## 2014-09-06 HISTORY — PX: BIOPSY: SHX5522

## 2014-09-06 HISTORY — PX: ESOPHAGOGASTRODUODENOSCOPY (EGD) WITH PROPOFOL: SHX5813

## 2014-09-06 HISTORY — PX: ESOPHAGEAL DILATION: SHX303

## 2014-09-06 SURGERY — COLONOSCOPY WITH PROPOFOL
Anesthesia: Monitor Anesthesia Care

## 2014-09-06 MED ORDER — ONDANSETRON HCL 4 MG/2ML IJ SOLN: 4.0000 mg | Freq: Once | INTRAMUSCULAR | Status: DC | PRN

## 2014-09-06 MED ORDER — OXYCODONE HCL 5 MG PO TABS: 5.0000 mg | ORAL_TABLET | Freq: Two times a day (BID) | ORAL | Status: DC | PRN

## 2014-09-06 MED ORDER — ONDANSETRON HCL 4 MG/2ML IJ SOLN
4.0000 mg | Freq: Once | INTRAMUSCULAR | Status: AC
  Administered 2014-09-06: 4 mg via INTRAVENOUS

## 2014-09-06 MED ORDER — LIDOCAINE HCL 1 % IJ SOLN
INTRAMUSCULAR | Status: DC | PRN
  Administered 2014-09-06: 10 mg via INTRADERMAL

## 2014-09-06 MED ORDER — FENTANYL CITRATE 0.05 MG/ML IJ SOLN
INTRAMUSCULAR | Status: AC
  Filled 2014-09-06: qty 2

## 2014-09-06 MED ORDER — FENTANYL CITRATE 0.05 MG/ML IJ SOLN
25.0000 ug | INTRAMUSCULAR | Status: AC
  Administered 2014-09-06 (×2): 25 ug via INTRAVENOUS

## 2014-09-06 MED ORDER — PROPOFOL 10 MG/ML IV BOLUS
INTRAVENOUS | Status: AC
  Filled 2014-09-06: qty 20

## 2014-09-06 MED ORDER — BUTAMBEN-TETRACAINE-BENZOCAINE 2-2-14 % EX AERO
INHALATION_SPRAY | CUTANEOUS | Status: AC
  Filled 2014-09-06: qty 56

## 2014-09-06 MED ORDER — FENTANYL CITRATE 0.05 MG/ML IJ SOLN
25.0000 ug | INTRAMUSCULAR | Status: DC | PRN
Start: 2014-09-06 — End: 2014-09-06

## 2014-09-06 MED ORDER — ONDANSETRON HCL 4 MG/2ML IJ SOLN
INTRAMUSCULAR | Status: AC
  Filled 2014-09-06: qty 2

## 2014-09-06 MED ORDER — MIDAZOLAM HCL 2 MG/2ML IJ SOLN
INTRAMUSCULAR | Status: AC
  Filled 2014-09-06: qty 2

## 2014-09-06 MED ORDER — LOPERAMIDE HCL 2 MG PO TABS: 1.0000 mg | ORAL_TABLET | Freq: Every day | ORAL | Status: DC

## 2014-09-06 MED ORDER — LACTATED RINGERS IV SOLN
INTRAVENOUS | Status: DC
  Administered 2014-09-06: 1000 mL via INTRAVENOUS

## 2014-09-06 MED ORDER — MIDAZOLAM HCL 5 MG/5ML IJ SOLN
INTRAMUSCULAR | Status: DC | PRN
  Administered 2014-09-06: 2 mg via INTRAVENOUS

## 2014-09-06 MED ORDER — STERILE WATER FOR IRRIGATION IR SOLN
Status: DC | PRN
  Administered 2014-09-06: 08:00:00

## 2014-09-06 MED ORDER — SIMETHICONE 40 MG/0.6ML PO SUSP
ORAL | Status: AC
  Filled 2014-09-06: qty 0.6

## 2014-09-06 MED ORDER — LIDOCAINE HCL (PF) 1 % IJ SOLN
INTRAMUSCULAR | Status: AC
  Filled 2014-09-06: qty 5

## 2014-09-06 MED ORDER — PROPOFOL INFUSION 10 MG/ML OPTIME
INTRAVENOUS | Status: DC | PRN
  Administered 2014-09-06: 150 ug/kg/min via INTRAVENOUS
  Administered 2014-09-06: 08:00:00 via INTRAVENOUS

## 2014-09-06 MED ORDER — MIDAZOLAM HCL 2 MG/2ML IJ SOLN
1.0000 mg | INTRAMUSCULAR | Status: DC | PRN
  Administered 2014-09-06 (×2): 2 mg via INTRAVENOUS
  Filled 2014-09-06: qty 2

## 2014-09-06 MED ORDER — BUTAMBEN-TETRACAINE-BENZOCAINE 2-2-14 % EX AERO
2.0000 | INHALATION_SPRAY | Freq: Once | CUTANEOUS | Status: AC
  Administered 2014-09-06: 2 via TOPICAL
  Filled 2014-09-06: qty 56

## 2014-09-06 SURGICAL SUPPLY — 10 items
BLOCK BITE 60FR ADLT L/F BLUE (MISCELLANEOUS) ×2 IMPLANT
FLOOR PAD 36X40 (MISCELLANEOUS) ×4
FORCEPS BIOP RAD 4 LRG CAP 4 (CUTTING FORCEPS) ×2 IMPLANT
FORMALIN 10 PREFIL 20ML (MISCELLANEOUS) ×4 IMPLANT
KIT CLEAN ENDO COMPLIANCE (KITS) ×4 IMPLANT
LUBRICANT JELLY 4.5OZ STERILE (MISCELLANEOUS) ×2 IMPLANT
MANIFOLD NEPTUNE II (INSTRUMENTS) ×4 IMPLANT
PAD FLOOR 36X40 (MISCELLANEOUS) IMPLANT
TUBING IRRIGATION ENDOGATOR (MISCELLANEOUS) ×2 IMPLANT
WATER STERILE IRR 1000ML POUR (IV SOLUTION) ×2 IMPLANT

## 2014-09-06 NOTE — Discharge Instructions (Signed)
Resume usual medications. High fiber diet. Imodium OTC 1 mg by mouth daily with breakfast. Oxycodone 5 mg twice daily as needed for back pain. Please see your physician next week. No driving for 24 hours. Physician will call with biopsy results.  Gastrointestinal Endoscopy, Care After Refer to this sheet in the next few weeks. These instructions provide you with information on caring for yourself after your procedure. Your caregiver may also give you more specific instructions. Your treatment has been planned according to current medical practices, but problems sometimes occur. Call your caregiver if you have any problems or questions after your procedure. HOME CARE INSTRUCTIONS  If you were given medicine to help you relax (sedative), do not drive, operate machinery, or sign important documents for 24 hours.  Avoid alcohol and hot or warm beverages for the first 24 hours after the procedure.  Only take over-the-counter or prescription medicines for pain, discomfort, or fever as directed by your caregiver. You may resume taking your normal medicines unless your caregiver tells you otherwise. Ask your caregiver when you may resume taking medicines that may cause bleeding, such as aspirin, clopidogrel, or warfarin.  You may return to your normal diet and activities on the day after your procedure, or as directed by your caregiver. Walking may help to reduce any bloated feeling in your abdomen.  Drink enough fluids to keep your urine clear or pale yellow.  You may gargle with salt water if you have a sore throat. SEEK IMMEDIATE MEDICAL CARE IF:  You have severe nausea or vomiting.  You have severe abdominal pain, abdominal cramps that last longer than 6 hours, or abdominal swelling (distention).  You have severe shoulder or back pain.  You have trouble swallowing.  You have shortness of breath, your breathing is shallow, or you are breathing faster than normal.  You have a fever or a  rapid heartbeat.  You vomit blood or material that looks like coffee grounds.  You have bloody, black, or tarry stools. MAKE SURE YOU:  Understand these instructions.  Will watch your condition.  Will get help right away if you are not doing well or get worse. Document Released: 03/09/2004 Document Revised: 12/10/2013 Document Reviewed: 10/26/2011 [facility] Patient Information 2015 [place], [place]. This information is not intended to replace advice given to you by your health care provider. Make sure you discuss any questions you have with your health care provider.   High-Fiber Diet Fiber is found in fruits, vegetables, and grains. A high-fiber diet encourages the addition of more whole grains, legumes, fruits, and vegetables in your diet. The recommended amount of fiber for adult males is 38 g per day. For adult females, it is 25 g per day. Pregnant and lactating women should get 28 g of fiber per day. If you have a digestive or bowel problem, ask your caregiver for advice before adding high-fiber foods to your diet. Eat a variety of high-fiber foods instead of only a select few type of foods.  PURPOSE  To increase stool bulk.  To make bowel movements more regular to prevent constipation.  To lower cholesterol.  To prevent overeating. WHEN IS THIS DIET USED?  It may be used if you have constipation and hemorrhoids.  It may be used if you have uncomplicated diverticulosis (intestine condition) and irritable bowel syndrome.  It may be used if you need help with weight management.  It may be used if you want to add it to your diet as a protective measure against  atherosclerosis, diabetes, and cancer. SOURCES OF FIBER  Whole-grain breads and cereals.  Fruits, such as apples, oranges, bananas, berries, prunes, and pears.  Vegetables, such as green peas, carrots, sweet potatoes, beets, broccoli, cabbage, spinach, and artichokes.  Legumes, such split peas, soy,  lentils.  Almonds. FIBER CONTENT IN FOODS Starches and Grains / Dietary Fiber (g)  Cheerios, 1 cup / 3 g  Corn Flakes cereal, 1 cup / 0.7 g  Rice crispy treat cereal, 1 cup / 0.3 g  Instant oatmeal (cooked),  cup / 2 g  Frosted wheat cereal, 1 cup / 5.1 g  Brown, long-grain rice (cooked), 1 cup / 3.5 g  White, long-grain rice (cooked), 1 cup / 0.6 g  Enriched macaroni (cooked), 1 cup / 2.5 g Legumes / Dietary Fiber (g)  Baked beans (canned, plain, or vegetarian),  cup / 5.2 g  Kidney beans (canned),  cup / 6.8 g  Pinto beans (cooked),  cup / 5.5 g Breads and Crackers / Dietary Fiber (g)  Plain or honey graham crackers, 2 squares / 0.7 g  Saltine crackers, 3 squares / 0.3 g  Plain, salted pretzels, 10 pieces / 1.8 g  Whole-wheat bread, 1 slice / 1.9 g  White bread, 1 slice / 0.7 g  Raisin bread, 1 slice / 1.2 g  Plain bagel, 3 oz / 2 g  Flour tortilla, 1 oz / 0.9 g  Corn tortilla, 1 small / 1.5 g  Hamburger or hotdog bun, 1 small / 0.9 g Fruits / Dietary Fiber (g)  Apple with skin, 1 medium / 4.4 g  Sweetened applesauce,  cup / 1.5 g  Banana,  medium / 1.5 g  Grapes, 10 grapes / 0.4 g  Orange, 1 small / 2.3 g  Raisin, 1.5 oz / 1.6 g  Melon, 1 cup / 1.4 g Vegetables / Dietary Fiber (g)  Green beans (canned),  cup / 1.3 g  Carrots (cooked),  cup / 2.3 g  Broccoli (cooked),  cup / 2.8 g  Peas (cooked),  cup / 4.4 g  Mashed potatoes,  cup / 1.6 g  Lettuce, 1 cup / 0.5 g  Corn (canned),  cup / 1.6 g  Tomato,  cup / 1.1 g Document Released: 07/26/2005 Document Revised: 01/25/2012 Document Reviewed: 10/28/2011 [employer] Patient Information 2015 [place], [place]. This information is not intended to replace advice given to you by your health care provider. Make sure you discuss any questions you have with your health care provider.

## 2014-09-06 NOTE — Transfer of Care (Signed)
Immediate Anesthesia Transfer of Care Note  Patient: Karla Young  Procedure(s) Performed: Procedure(s): COLONOSCOPY WITH PROPOFOL (Procedure #2) At cecum at 0819; total withdrawal time=20 minutes (N/A) ESOPHAGOGASTRODUODENOSCOPY (EGD) WITH PROPOFOL (Procedure #1)  (N/A) ESOPHAGEAL DILATION WITH MALONEY DILATOR 56MM (N/A) BIOPSY  Patient Location: PACU  Anesthesia Type:MAC  Level of Consciousness: awake  Airway & Oxygen Therapy: Patient Spontanous Breathing  Post-op Assessment: Report given to RN  Post vital signs: Reviewed  Last Vitals:  Filed Vitals:   09/06/14 0735  BP: 106/75  Pulse:   Temp:   Resp: 20    Complications: No apparent anesthesia complications

## 2014-09-06 NOTE — Anesthesia Preprocedure Evaluation (Signed)
Anesthesia Evaluation  Patient identified by MRN, date of birth, ID band Patient awake    Reviewed: Allergy & Precautions, NPO status , Patient's Chart, lab work & pertinent test results  Airway Mallampati: II  TM Distance: >3 FB     Dental  (+) Partial Upper, Missing, Poor Dentition, Dental Advisory Given   Pulmonary Current Smoker,  breath sounds clear to auscultation        Cardiovascular hypertension, Pt. on medications Rhythm:Regular Rate:Normal     Neuro/Psych  Headaches, PSYCHIATRIC DISORDERS Anxiety  Neuromuscular disease    GI/Hepatic GERD-  Medicated,(+) Hepatitis -, C  Endo/Other  Hypothyroidism   Renal/GU      Musculoskeletal  (+) Fibromyalgia -  Abdominal   Peds  Hematology   Anesthesia Other Findings   Reproductive/Obstetrics                             Anesthesia Physical Anesthesia Plan  ASA: III  Anesthesia Plan: MAC   Post-op Pain Management:    Induction: Intravenous  Airway Management Planned: Simple Face Mask  Additional Equipment:   Intra-op Plan:   Post-operative Plan:   Informed Consent: I have reviewed the patients History and Physical, chart, labs and discussed the procedure including the risks, benefits and alternatives for the proposed anesthesia with the patient or authorized representative who has indicated his/her understanding and acceptance.     Plan Discussed with:   Anesthesia Plan Comments:         Anesthesia Quick Evaluation

## 2014-09-06 NOTE — H&P (Signed)
Karla Young is an 54 y.o. female.   Chief Complaint: Patient is here for EGD, ED and colonoscopy. HPI: Patient is 54 year old Caucasian female who was chronic GERD who now presents with recurrent heartburn not responding therapy. She also complains of intermittent dysphagia with liquids pills and solids. She says she was doing well with AcipHex but her co-pay was too high and now she is on omeprazole 40 mg twice daily and still having heartburn and regurgitation. She also complains of diarrhea. She has history of irritable bowel syndrome and previously used to have more constipation than diarrhea. Since she completed therapy for hepatitis C she's had 5-6 stools on most days. She has noted blood in the tissue at times. She denies anorexia or recent weight loss. He continues to smoke cigarettes and drinks alcohol socially. Family history is negative for CRC.  Past Medical History  Diagnosis Date  . Hepatitis C   . MRSA (methicillin resistant staph aureus) culture positive   . Hypothyroid   . Migraines   . GERD (gastroesophageal reflux disease)   . Hypertension   . Anxiety   . Fibromyalgia     Past Surgical History  Procedure Laterality Date  . Cholecystectomy    . Partial hysterectomy       One ovary left  . Back surgery      lumbar removal of disc  . Hemorrhoid surgery      Dr Vicenta AlyJenkins-2004  . Abdominal hysterectomy      partial    History reviewed. No pertinent family history. Social History:  reports that she has been smoking Cigarettes.  She has a 7.5 pack-year smoking history. She does not have any smokeless tobacco history on file. She reports that she drinks alcohol. She reports that she does not use illicit drugs.  Allergies: No Known Allergies  Medications Prior to Admission  Medication Sig Dispense Refill  . ALPRAZolam (XANAX) 1 MG tablet Take 1 mg by mouth 3 (three) times daily.     Marland Kitchen. levothyroxine (SYNTHROID, LEVOTHROID) 100 MCG tablet Take 100 mcg by mouth daily  before breakfast.    . lisinopril (PRINIVIL,ZESTRIL) 10 MG tablet Take 10 mg by mouth daily.    Marland Kitchen. LYRICA 75 MG capsule Take 1 capsule by mouth 3 (three) times daily.  0  . omeprazole (PRILOSEC) 40 MG capsule Take 40 mg by mouth 2 (two) times daily.     Marland Kitchen. oxyCODONE (OXY IR/ROXICODONE) 5 MG immediate release tablet Take 1 tablet by mouth 2 (two) times daily as needed for moderate pain.   0  . polyethylene glycol-electrolytes (NULYTELY/GOLYTELY) 420 G solution Take 4,000 mLs by mouth once. 4000 mL 0  . dicyclomine (BENTYL) 10 MG capsule Take 1 capsule (10 mg total) by mouth 3 (three) times daily before meals. (Patient not taking: Reported on 08/22/2014) 90 capsule 0  . nystatin-triamcinolone (MYCOLOG II) cream Apply 1 application topically 2 (two) times daily. (Patient not taking: Reported on 08/12/2014) 30 g 0  . SUMAtriptan (IMITREX) 100 MG tablet Take 100 mg by mouth every 2 (two) hours as needed for migraine or headache. May repeat in 2 hours if headache persists or recurs.      No results found for this or any previous visit (from the past 48 hour(s)). No results found.  ROS  Blood pressure 106/75, pulse 73, temperature 98.5 F (36.9 C), temperature source Oral, resp. rate 20, height 5\' 4"  (1.626 m), weight 191 lb (86.637 kg), SpO2 99 %. Physical Exam  Constitutional: She  appears well-developed and well-nourished.  HENT:  Mouth/Throat: Oropharynx is clear and moist.  Eyes: Conjunctivae are normal. No scleral icterus.  Neck: No thyromegaly present.  Cardiovascular: Normal rate, regular rhythm and normal heart sounds.   Respiratory: Effort normal and breath sounds normal.  GI: Soft. She exhibits no distension and no mass. There is no tenderness.  Musculoskeletal: She exhibits no edema.  Lymphadenopathy:    She has no cervical adenopathy.  Neurological: She is alert.  Skin: Skin is warm and dry.     Assessment/Plan Poorly controlled symptoms of GERD. Dysphagia to liquids solids and  pills. EGD with esophageal dilation and diagnostic colonoscopy.  Ayad Nieman U 09/06/2014, 7:31 AM

## 2014-09-06 NOTE — Anesthesia Postprocedure Evaluation (Signed)
  Anesthesia Post-op Note  Patient: Karla Young  Procedure(s) Performed: Procedure(s): COLONOSCOPY WITH PROPOFOL (Procedure #2) At cecum at 0819; total withdrawal time=20 minutes (N/A) ESOPHAGOGASTRODUODENOSCOPY (EGD) WITH PROPOFOL (Procedure #1)  (N/A) ESOPHAGEAL DILATION WITH MALONEY DILATOR 56MM (N/A) BIOPSY  Patient Location: PACU  Anesthesia Type:MAC  Level of Consciousness: awake, alert  and oriented  Airway and Oxygen Therapy: Patient Spontanous Breathing  Post-op Pain: none  Post-op Assessment: Post-op Vital signs reviewed, Patient's Cardiovascular Status Stable, Respiratory Function Stable, Patent Airway and No signs of Nausea or vomiting  Post-op Vital Signs: Reviewed and stable  Last Vitals:  Filed Vitals:   09/06/14 0735  BP: 106/75  Pulse:   Temp:   Resp: 20    Complications: No apparent anesthesia complications

## 2014-09-06 NOTE — Op Note (Addendum)
EGD PROCEDURE REPORT  PATIENT:  Karla Young  MR#:  [id number] Birthdate:  04/21/1961, 54 y.o., female Endoscopist:  Dr. Malissa HippoNajeeb U. Rehman, MD Referred By:  Dr. Ignatius Speckinghruv B. Vyas, MD Procedure Date: 09/06/2014  Procedure:   EGD, ED & Colonoscopy  Indications:  Patient is 54 year old Caucasian female who was chronic GERD and now presents with persistent symptoms of heartburn and regurgitation despite on double dose PPI. She also complains of dysphagia to solids and pills. She has history of IBS with constipation but lately she's been having diarrhea. She is therefore undergoing EGD with dilation and diagnostic colonoscopy.           Informed Consent:  The risks, benefits, alternatives & imponderables which include, but are not limited to, bleeding, infection, perforation, drug reaction and potential missed lesion have been reviewed.  The potential for biopsy, lesion removal, esophageal dilation, etc. have also been discussed.  Questions have been answered.  All parties agreeable.  Please see history & physical in medical record for more information.  Medications:  Monitored anesthesia care. Cetacaine spray topically for oropharyngeal anesthesia  EGD  Description of procedure:  The endoscope was introduced through the mouth and advanced to the second portion of the duodenum without difficulty or limitations. The mucosal surfaces were surveyed very carefully during advancement of the scope and upon withdrawal.  Findings:  Esophagus:  Mucosa of the esophagus was normal. GE junction was serrated with 2 tiny islands of squamous epithelium distal to GEJ. No ring or stricture identified. GEJ:  38 cm Stomach:  Stomach was empty and distended very well with insufflation. Folds in the proximal stomach were normal. Examination mucosa revealed mosaic pattern. There was patchy edema and erythema to antral mucosa along with single erosion at pyloric channel. Pyloric channel however is patent. Angularis fundus and  cardia were examined by retroflexion of scope and were unremarkable. Duodenum:  Normal bulbar and post bulbar mucosa.  Therapeutic/Diagnostic Maneuvers Performed:   Esophagus was dilated by passing 56 [place]oney dilator to full insertion. Endoscope was passed again and no mucosal disruption noted. Biopsies were taken from antral mucosa as well has GE junction.  COLONOSCOPY Description of procedure:  After a digital rectal exam was performed, that colonoscope was advanced from the anus through the rectum and colon to the area of the cecum, ileocecal valve and appendiceal orifice. The cecum was deeply intubated. These structures were well-seen and photographed for the record. From the level of the cecum and ileocecal valve, the scope was slowly and cautiously withdrawn. The mucosal surfaces were carefully surveyed utilizing scope tip to flexion to facilitate fold flattening as needed. The scope was pulled down into the rectum where a thorough exam including retroflexion was performed. Terminal ileum was also examined.  Findings:   Prep satisfactory. Normal mucosa of terminal ileum. Normal mucosa of cecum, ascending colon, hepatic flexure, transverse colon, splenic flexure and descending colon. Findings mucosal pigmentation noted to mucosa of sigmoid colon. Circumferential scarring noted at 20 cm from the anal margin indicative of of prior surgery. Normal rectal mucosa. Small hemorrhoids below the dentate line  Therapeutic/Diagnostic Maneuvers Performed:  None  Complications:  None  Cecal Withdrawal Time:  13 minutes  Impression:  EGD findings; Serrated GE junction suspicious for short segment Barrett's esophagus.  Mild portal gastropathy and antral gastritis with single pyloric channel erosion. No evidence of esophageal or gastric varices. Esophagus dilated by passing 56 French Maloney dilator but no mucosal disruption noted. Biopsy taken from GE junction to  rule out short segment  Barrett's. Biopsies also taken from antral mucosa.  Colonoscopy findings; Normal mucosa of terminal ileum. Mild changes of melanosis coli involving sigmoid colon. Scarring noted at 20 cm from anal margin secondary to prior surgery.  Comment; Suspect diarrhea secondary to IBS. Will review old records to find out type of colonic surgery she had.  Recommendations:  Standard instructions given. Anti-reflux measures reinforced. Patient must quit cigarette smoking. Imodium OTC 1 mg by mouth every morning. She was given prescription for 8 tablets of oxycodone 5 mg for her fibromyalgia and back pain until she can see Dr. Sherril Croon.  REHMAN,NAJEEB U  09/06/2014 8:46 AM  CC: Dr. Ignatius Specking., MD & Dr. Bonnetta Barry ref. provider found

## 2014-09-09 ENCOUNTER — Encounter (HOSPITAL_COMMUNITY): Payer: Self-pay | Admitting: Internal Medicine

## 2014-09-11 ENCOUNTER — Encounter (INDEPENDENT_AMBULATORY_CARE_PROVIDER_SITE_OTHER): Payer: Self-pay | Admitting: *Deleted

## 2015-01-29 ENCOUNTER — Encounter (INDEPENDENT_AMBULATORY_CARE_PROVIDER_SITE_OTHER): Payer: Self-pay | Admitting: *Deleted

## 2015-01-29 ENCOUNTER — Other Ambulatory Visit (INDEPENDENT_AMBULATORY_CARE_PROVIDER_SITE_OTHER): Payer: Self-pay | Admitting: *Deleted

## 2015-01-29 DIAGNOSIS — B182 Chronic viral hepatitis C: Secondary | ICD-10-CM

## 2015-02-19 LAB — CBC WITH DIFFERENTIAL/PLATELET
BASOS ABS: 0.1 10*3/uL (ref 0.0–0.1)
Basophils Relative: 1 % (ref 0–1)
EOS ABS: 0.3 10*3/uL (ref 0.0–0.7)
Eosinophils Relative: 3 % (ref 0–5)
HCT: 44 % (ref 36.0–46.0)
Hemoglobin: 14.6 g/dL (ref 12.0–15.0)
Lymphocytes Relative: 25 % (ref 12–46)
Lymphs Abs: 2.7 10*3/uL (ref 0.7–4.0)
MCH: 29.4 pg (ref 26.0–34.0)
MCHC: 33.2 g/dL (ref 30.0–36.0)
MCV: 88.5 fL (ref 78.0–100.0)
MONO ABS: 1.1 10*3/uL — AB (ref 0.1–1.0)
MPV: 10.4 fL (ref 8.6–12.4)
Monocytes Relative: 10 % (ref 3–12)
Neutro Abs: 6.5 10*3/uL (ref 1.7–7.7)
Neutrophils Relative %: 61 % (ref 43–77)
PLATELETS: 235 10*3/uL (ref 150–400)
RBC: 4.97 MIL/uL (ref 3.87–5.11)
RDW: 15.3 % (ref 11.5–15.5)
WBC: 10.6 10*3/uL — ABNORMAL HIGH (ref 4.0–10.5)

## 2015-02-19 LAB — HEPATIC FUNCTION PANEL
ALBUMIN: 4 g/dL (ref 3.5–5.2)
ALK PHOS: 75 U/L (ref 39–117)
ALT: 21 U/L (ref 0–35)
AST: 22 U/L (ref 0–37)
BILIRUBIN INDIRECT: 0.4 mg/dL (ref 0.2–1.2)
BILIRUBIN TOTAL: 0.5 mg/dL (ref 0.2–1.2)
Bilirubin, Direct: 0.1 mg/dL (ref 0.0–0.3)
TOTAL PROTEIN: 7.1 g/dL (ref 6.0–8.3)

## 2015-02-19 LAB — AFP TUMOR MARKER: AFP-Tumor Marker: 3.3 ng/mL (ref <6.1)

## 2015-02-19 LAB — HEPATITIS C RNA QUANTITATIVE: HCV QUANT: NOT DETECTED [IU]/mL (ref <15)

## 2015-02-21 ENCOUNTER — Telehealth (INDEPENDENT_AMBULATORY_CARE_PROVIDER_SITE_OTHER): Payer: Self-pay | Admitting: Internal Medicine

## 2015-02-21 DIAGNOSIS — B1920 Unspecified viral hepatitis C without hepatic coma: Secondary | ICD-10-CM

## 2015-02-21 NOTE — Telephone Encounter (Signed)
Order for US RUQ

## 2015-02-26 ENCOUNTER — Ambulatory Visit (INDEPENDENT_AMBULATORY_CARE_PROVIDER_SITE_OTHER): Payer: Managed Care, Other (non HMO) | Admitting: Internal Medicine

## 2015-02-28 ENCOUNTER — Ambulatory Visit (HOSPITAL_COMMUNITY): Admission: RE | Admit: 2015-02-28 | Payer: Managed Care, Other (non HMO) | Source: Ambulatory Visit

## 2015-04-09 ENCOUNTER — Encounter (INDEPENDENT_AMBULATORY_CARE_PROVIDER_SITE_OTHER): Payer: Self-pay | Admitting: *Deleted

## 2018-02-07 DIAGNOSIS — Z720 Tobacco use: Secondary | ICD-10-CM | POA: Insufficient documentation

## 2021-08-03 ENCOUNTER — Other Ambulatory Visit: Payer: Self-pay

## 2021-08-03 ENCOUNTER — Encounter (HOSPITAL_COMMUNITY): Payer: Self-pay | Admitting: Radiology

## 2021-08-03 ENCOUNTER — Inpatient Hospital Stay (HOSPITAL_COMMUNITY)
Admission: EM | Admit: 2021-08-03 | Discharge: 2021-08-06 | DRG: 434 | Disposition: A | Discharge status: Home / Self Care | Payer: Self-pay | Attending: Family Medicine | Admitting: Family Medicine

## 2021-08-03 ENCOUNTER — Emergency Department (HOSPITAL_COMMUNITY): Payer: Self-pay

## 2021-08-03 DIAGNOSIS — Z79890 Hormone replacement therapy (postmenopausal): Secondary | ICD-10-CM

## 2021-08-03 DIAGNOSIS — K219 Gastro-esophageal reflux disease without esophagitis: Secondary | ICD-10-CM | POA: Diagnosis present

## 2021-08-03 DIAGNOSIS — R140 Abdominal distension (gaseous): Secondary | ICD-10-CM | POA: Diagnosis present

## 2021-08-03 DIAGNOSIS — Z90711 Acquired absence of uterus with remaining cervical stump: Secondary | ICD-10-CM

## 2021-08-03 DIAGNOSIS — J449 Chronic obstructive pulmonary disease, unspecified: Secondary | ICD-10-CM | POA: Diagnosis present

## 2021-08-03 DIAGNOSIS — K7031 Alcoholic cirrhosis of liver with ascites: Principal | ICD-10-CM | POA: Diagnosis present

## 2021-08-03 DIAGNOSIS — F1010 Alcohol abuse, uncomplicated: Secondary | ICD-10-CM | POA: Diagnosis present

## 2021-08-03 DIAGNOSIS — M797 Fibromyalgia: Secondary | ICD-10-CM | POA: Diagnosis present

## 2021-08-03 DIAGNOSIS — F17210 Nicotine dependence, cigarettes, uncomplicated: Secondary | ICD-10-CM | POA: Diagnosis present

## 2021-08-03 DIAGNOSIS — I10 Essential (primary) hypertension: Secondary | ICD-10-CM | POA: Diagnosis present

## 2021-08-03 DIAGNOSIS — Z9049 Acquired absence of other specified parts of digestive tract: Secondary | ICD-10-CM

## 2021-08-03 DIAGNOSIS — E876 Hypokalemia: Secondary | ICD-10-CM | POA: Diagnosis present

## 2021-08-03 DIAGNOSIS — R1084 Generalized abdominal pain: Secondary | ICD-10-CM

## 2021-08-03 DIAGNOSIS — Z20822 Contact with and (suspected) exposure to covid-19: Secondary | ICD-10-CM | POA: Diagnosis present

## 2021-08-03 DIAGNOSIS — E039 Hypothyroidism, unspecified: Secondary | ICD-10-CM | POA: Diagnosis present

## 2021-08-03 DIAGNOSIS — D7589 Other specified diseases of blood and blood-forming organs: Secondary | ICD-10-CM | POA: Diagnosis present

## 2021-08-03 DIAGNOSIS — K5900 Constipation, unspecified: Secondary | ICD-10-CM | POA: Diagnosis present

## 2021-08-03 DIAGNOSIS — R188 Other ascites: Secondary | ICD-10-CM | POA: Diagnosis present

## 2021-08-03 DIAGNOSIS — Z79899 Other long term (current) drug therapy: Secondary | ICD-10-CM

## 2021-08-03 DIAGNOSIS — K589 Irritable bowel syndrome without diarrhea: Secondary | ICD-10-CM | POA: Diagnosis present

## 2021-08-03 DIAGNOSIS — K7011 Alcoholic hepatitis with ascites: Secondary | ICD-10-CM | POA: Diagnosis present

## 2021-08-03 DIAGNOSIS — K7460 Unspecified cirrhosis of liver: Secondary | ICD-10-CM | POA: Diagnosis present

## 2021-08-03 HISTORY — DX: Chronic obstructive pulmonary disease, unspecified: J44.9

## 2021-08-03 LAB — COMPREHENSIVE METABOLIC PANEL
ALT: 26 U/L (ref 0–44)
AST: 117 U/L — ABNORMAL HIGH (ref 15–41)
Albumin: 2.7 g/dL — ABNORMAL LOW (ref 3.5–5.0)
Alkaline Phosphatase: 111 U/L (ref 38–126)
Anion gap: 9 (ref 5–15)
BUN: 5 mg/dL — ABNORMAL LOW (ref 6–20)
CO2: 32 mmol/L (ref 22–32)
Calcium: 8.4 mg/dL — ABNORMAL LOW (ref 8.9–10.3)
Chloride: 94 mmol/L — ABNORMAL LOW (ref 98–111)
Creatinine, Ser: 0.6 mg/dL (ref 0.44–1.00)
GFR, Estimated: >60 mL/min (ref >60)
Glucose, Bld: 101 mg/dL — ABNORMAL HIGH (ref 70–99)
Potassium: 3.5 mmol/L (ref 3.5–5.1)
Sodium: 135 mmol/L (ref 135–145)
Total Bilirubin: 2.3 mg/dL — ABNORMAL HIGH (ref 0.3–1.2)
Total Protein: 6.8 g/dL (ref 6.5–8.1)

## 2021-08-03 LAB — LIPASE, BLOOD: Lipase: 31 U/L (ref 11–51)

## 2021-08-03 LAB — CBC
HCT: 43.9 % (ref 36.0–46.0)
Hemoglobin: 15 g/dL (ref 12.0–15.0)
MCH: 34.9 pg — ABNORMAL HIGH (ref 26.0–34.0)
MCHC: 34.2 g/dL (ref 30.0–36.0)
MCV: 102.1 fL — ABNORMAL HIGH (ref 80.0–100.0)
Platelets: 195 10*3/uL (ref 150–400)
RBC: 4.3 MIL/uL (ref 3.87–5.11)
RDW: 15.3 % (ref 11.5–15.5)
WBC: 10 10*3/uL (ref 4.0–10.5)
nRBC: 0 % (ref 0.0–0.2)

## 2021-08-03 LAB — ACETAMINOPHEN LEVEL: Acetaminophen (Tylenol), Serum: <10 ug/mL — ABNORMAL LOW (ref 10–30)

## 2021-08-03 LAB — URINALYSIS, MICROSCOPIC (REFLEX)

## 2021-08-03 LAB — URINALYSIS, ROUTINE W REFLEX MICROSCOPIC
Bilirubin Urine: NEGATIVE
Glucose, UA: NEGATIVE mg/dL
Hgb urine dipstick: NEGATIVE
Ketones, ur: NEGATIVE mg/dL
Nitrite: NEGATIVE
Protein, ur: NEGATIVE mg/dL
Specific Gravity, Urine: 1.01 (ref 1.005–1.030)
pH: 6 (ref 5.0–8.0)

## 2021-08-03 LAB — RESP PANEL BY RT-PCR (FLU A&B, COVID) ARPGX2
Influenza A by PCR: NEGATIVE
Influenza B by PCR: NEGATIVE
SARS Coronavirus 2 by RT PCR: NEGATIVE

## 2021-08-03 LAB — ETHANOL: Alcohol, Ethyl (B): <10 mg/dL (ref <10)

## 2021-08-03 MED ORDER — SODIUM CHLORIDE 0.9 % IV SOLN: 250.0000 mL | INTRAVENOUS | Status: DC | PRN

## 2021-08-03 MED ORDER — SODIUM CHLORIDE 0.9% FLUSH
3.0000 mL | Freq: Two times a day (BID) | INTRAVENOUS | Status: DC
  Administered 2021-08-03 – 2021-08-06 (×5): 3 mL via INTRAVENOUS

## 2021-08-03 MED ORDER — PANTOPRAZOLE SODIUM 40 MG PO TBEC
40.0000 mg | DELAYED_RELEASE_TABLET | Freq: Two times a day (BID) | ORAL | Status: DC
  Administered 2021-08-04 – 2021-08-06 (×5): 40 mg via ORAL
  Filled 2021-08-03 (×5): qty 1

## 2021-08-03 MED ORDER — SODIUM CHLORIDE 0.9 % IV SOLN
2.0000 g | INTRAVENOUS | Status: DC
  Administered 2021-08-03 – 2021-08-05 (×3): 2 g via INTRAVENOUS
  Filled 2021-08-03 (×3): qty 20

## 2021-08-03 MED ORDER — FOLIC ACID 1 MG PO TABS
1.0000 mg | ORAL_TABLET | Freq: Every day | ORAL | Status: DC
  Administered 2021-08-04 – 2021-08-06 (×3): 1 mg via ORAL
  Filled 2021-08-03 (×5): qty 1

## 2021-08-03 MED ORDER — ALBUTEROL SULFATE (2.5 MG/3ML) 0.083% IN NEBU: 2.5000 mg | INHALATION_SOLUTION | RESPIRATORY_TRACT | Status: DC | PRN

## 2021-08-03 MED ORDER — THIAMINE HCL 100 MG PO TABS
100.0000 mg | ORAL_TABLET | Freq: Every day | ORAL | Status: DC
  Administered 2021-08-04 – 2021-08-06 (×3): 100 mg via ORAL
  Filled 2021-08-03 (×5): qty 1

## 2021-08-03 MED ORDER — MORPHINE SULFATE (PF) 2 MG/ML IV SOLN
2.0000 mg | INTRAVENOUS | Status: DC | PRN
  Administered 2021-08-03 – 2021-08-05 (×9): 2 mg via INTRAVENOUS
  Filled 2021-08-03 (×9): qty 1

## 2021-08-03 MED ORDER — LORAZEPAM 2 MG/ML IJ SOLN
0.0000 mg | Freq: Four times a day (QID) | INTRAMUSCULAR | Status: AC
  Administered 2021-08-03: 22:00:00 1 mg via INTRAVENOUS
  Filled 2021-08-03: qty 1

## 2021-08-03 MED ORDER — LORAZEPAM 2 MG/ML IJ SOLN
0.0000 mg | Freq: Two times a day (BID) | INTRAMUSCULAR | Status: DC
Start: 2021-08-05 — End: 2021-08-06

## 2021-08-03 MED ORDER — ADULT MULTIVITAMIN W/MINERALS CH
1.0000 | ORAL_TABLET | Freq: Every day | ORAL | Status: DC
  Administered 2021-08-04 – 2021-08-06 (×3): 1 via ORAL
  Filled 2021-08-03 (×4): qty 1

## 2021-08-03 MED ORDER — ACETAMINOPHEN 325 MG PO TABS
650.0000 mg | ORAL_TABLET | Freq: Four times a day (QID) | ORAL | Status: DC | PRN
  Administered 2021-08-04: 01:00:00 650 mg via ORAL
  Filled 2021-08-03: qty 2

## 2021-08-03 MED ORDER — ONDANSETRON HCL 4 MG PO TABS: 4.0000 mg | ORAL_TABLET | Freq: Four times a day (QID) | ORAL | Status: DC | PRN

## 2021-08-03 MED ORDER — ONDANSETRON HCL 4 MG/2ML IJ SOLN: 4.0000 mg | Freq: Four times a day (QID) | INTRAMUSCULAR | Status: DC | PRN

## 2021-08-03 MED ORDER — FENTANYL CITRATE PF 50 MCG/ML IJ SOSY
50.0000 ug | PREFILLED_SYRINGE | Freq: Once | INTRAMUSCULAR | Status: AC
  Administered 2021-08-03: 16:00:00 50 ug via INTRAVENOUS
  Filled 2021-08-03: qty 1

## 2021-08-03 MED ORDER — SODIUM CHLORIDE 0.9% FLUSH: 3.0000 mL | INTRAVENOUS | Status: DC | PRN

## 2021-08-03 MED ORDER — LORAZEPAM 1 MG PO TABS: 1.0000 mg | ORAL_TABLET | ORAL | Status: DC | PRN

## 2021-08-03 MED ORDER — THIAMINE HCL 100 MG/ML IJ SOLN: 100.0000 mg | Freq: Every day | INTRAMUSCULAR | Status: DC

## 2021-08-03 MED ORDER — LORAZEPAM 2 MG/ML IJ SOLN
1.0000 mg | INTRAMUSCULAR | Status: DC | PRN
  Administered 2021-08-05: 1 mg via INTRAVENOUS
  Filled 2021-08-03: qty 1

## 2021-08-03 MED ORDER — LEVOTHYROXINE SODIUM 88 MCG PO TABS
88.0000 ug | ORAL_TABLET | Freq: Every day | ORAL | Status: DC
  Filled 2021-08-03 (×2): qty 1

## 2021-08-03 MED ORDER — PREGABALIN 75 MG PO CAPS
75.0000 mg | ORAL_CAPSULE | Freq: Every day | ORAL | Status: DC
  Administered 2021-08-04 – 2021-08-06 (×3): 75 mg via ORAL
  Filled 2021-08-03 (×5): qty 1

## 2021-08-03 MED ORDER — ACETAMINOPHEN 650 MG RE SUPP: 650.0000 mg | Freq: Four times a day (QID) | RECTAL | Status: DC | PRN

## 2021-08-03 MED ORDER — ONDANSETRON HCL 4 MG/2ML IJ SOLN
4.0000 mg | Freq: Once | INTRAMUSCULAR | Status: AC
  Administered 2021-08-03: 16:00:00 4 mg via INTRAVENOUS
  Filled 2021-08-03: qty 2

## 2021-08-03 MED ORDER — IOHEXOL 300 MG/ML  SOLN
100.0000 mL | Freq: Once | INTRAMUSCULAR | Status: AC | PRN
  Administered 2021-08-03: 16:00:00 100 mL via INTRAVENOUS

## 2021-08-03 NOTE — ED Provider Notes (Signed)
[facility] EMERGENCY DEPARTMENT Provider Note   CSN: [id number] Arrival date & time: 08/03/21  1220     History Chief Complaint  Patient presents with   Abdominal Pain    Karla Young is a 60 y.o. female.   Abdominal Pain Associated symptoms: cough, nausea and vomiting   Associated symptoms: no chest pain and no dysuria   Patient presents abdominal pain.  Cough.  Has been going for about a month now.  States her abdomen is much larger than normal.  Per patient's family member she was also jaundiced but that is improved some.  No fevers or chills.  Has a cough with some clear sputum production.  Did have previous history of hepatitis C but states she was cured with treatment.  Had seen Dr. Karilyn Cota.  Had had some orange-colored urine.  States pain is worse in the right lower abdomen.  Has had previous hernia repair.  Also previous hysterectomy.    Past Medical History:  Diagnosis Date   Anxiety    Fibromyalgia    GERD (gastroesophageal reflux disease)    Hepatitis C    Hypertension    Hypothyroid    Migraines    MRSA (methicillin resistant staph aureus) culture positive     Patient Active Problem List   Diagnosis Date Noted   Hepatitis C 02/12/2014   Unspecified hypothyroidism 02/12/2014    Past Surgical History:  Procedure Laterality Date   ABDOMINAL HYSTERECTOMY     partial   BACK SURGERY     lumbar removal of disc   BIOPSY  09/06/2014   Procedure: BIOPSY;  Surgeon: Malissa Hippo, MD;  Location: [facility];  Service: Endoscopy;;   CHOLECYSTECTOMY     COLONOSCOPY WITH PROPOFOL N/A 09/06/2014   Procedure: COLONOSCOPY WITH PROPOFOL (Procedure #2) At cecum at 0819; total withdrawal time=13 minutes;  Surgeon: Malissa Hippo, MD;  Location: [facility];  Service: Endoscopy;  Laterality: N/A;   ESOPHAGEAL DILATION N/A 09/06/2014   Procedure: ESOPHAGEAL DILATION WITH MALONEY DILATOR ;  Surgeon: Malissa Hippo, MD;  Location: [facility];  Service: Endoscopy;  Laterality: N/A;    ESOPHAGOGASTRODUODENOSCOPY (EGD) WITH PROPOFOL N/A 09/06/2014   Procedure: ESOPHAGOGASTRODUODENOSCOPY (EGD) WITH PROPOFOL (Procedure #1) ;  Surgeon: Malissa Hippo, MD;  Location: [facility];  Service: Endoscopy;  Laterality: N/A;   HEMORRHOID SURGERY     Dr Vicenta Aly   PARTIAL HYSTERECTOMY      One ovary left     OB History   No obstetric history on file.     No family history on file.  Social History   Tobacco Use   Smoking status: Every Day    Packs/day: 0.25    Years: 30.00    Pack years: 7.50    Types: Cigarettes   Tobacco comments:    3-4 cigs a day since age 40.  Substance Use Topics   Alcohol use: Yes    Alcohol/week: 0.0 standard drinks    Comment: occasionally   Drug use: No    Home Medications Prior to Admission medications   Medication Sig Start Date End Date Taking? Authorizing Provider  Albuterol Sulfate (PROAIR HFA IN) Inhale 1-2 puffs into the lungs daily as needed.   Yes [provider]  diphenhydrAMINE (BENADRYL) 25 MG tablet Take 50 mg by mouth every 6 (six) hours as needed for sleep.   Yes [provider]  levothyroxine (SYNTHROID) 88 MCG tablet Take 88 mcg by mouth daily before breakfast. 06/12/20  Yes [provider]  lisinopril (PRINIVIL,ZESTRIL) 10 MG tablet Take 10 mg by mouth daily.   Yes [provider]  LYRICA 75 MG capsule Take 1 capsule by mouth daily. 08/06/14  Yes [provider]  omeprazole (PRILOSEC) 40 MG capsule Take 40 mg by mouth 2 (two) times daily.    Yes [provider]  dicyclomine (BENTYL) 10 MG capsule Take 1 capsule (10 mg total) by mouth 3 (three) times daily before meals. Patient not taking: Reported on 08/22/2014 05/13/14   Len Blalock, NP  nystatin-triamcinolone (MYCOLOG II) cream Apply 1 application topically 2 (two) times daily. Patient not taking: Reported on 08/12/2014 05/16/14   Len Blalock, NP  oxyCODONE (OXY IR/ROXICODONE) 5 MG immediate release tablet Take 1  tablet (5 mg total) by mouth 2 (two) times daily as needed for severe pain. Patient not taking: Reported on 08/03/2021 09/06/14   Malissa Hippo, MD    Allergies    Patient has no known allergies.  Review of Systems   Review of Systems  Constitutional:  Positive for appetite change.  HENT:  Positive for congestion.   Respiratory:  Positive for cough.   Cardiovascular:  Negative for chest pain.  Gastrointestinal:  Positive for abdominal distention, abdominal pain, nausea and vomiting.  Genitourinary:  Negative for dysuria.  Musculoskeletal:  Negative for back pain.  Skin:  Negative for rash.  Neurological:  Negative for weakness.  Psychiatric/Behavioral:  Negative for confusion.    Physical Exam Updated Vital Signs BP 131/89 (BP Location: Right Arm)    Pulse 77    Temp 98.7 F (37.1 C) (Oral)    Resp (!) 21    Ht 5\' 4"  (1.626 m)    Wt 90.7 kg    SpO2 94%    BMI 34.33 kg/m   Physical Exam Vitals and nursing note reviewed.  HENT:     Head: Normocephalic.  Eyes:     General: No scleral icterus. Cardiovascular:     Rate and Rhythm: Normal rate.  Abdominal:     Tenderness: There is abdominal tenderness.     Comments: Diffuse tenderness.  Midline infraumbilical scar from previous surgery.  No direct hernia palpated.  Does have distention.  Skin:    General: Skin is warm.     Capillary Refill: Capillary refill takes less than 2 seconds.  Neurological:     Mental Status: She is alert and oriented to person, place, and time.    ED Results / Procedures / Treatments   Labs (all labs ordered are listed, but only abnormal results are displayed) Labs Reviewed  COMPREHENSIVE METABOLIC PANEL - Abnormal; Notable for the following components:      Result Value   Chloride 94 (*)    Glucose, Bld 101 (*)    BUN 5 (*)    Calcium 8.4 (*)    Albumin 2.7 (*)    AST 117 (*)    Total Bilirubin 2.3 (*)    All other components within normal limits  CBC - Abnormal; Notable for the  following components:   MCV 102.1 (*)    MCH 34.9 (*)    All other components within normal limits  URINALYSIS, ROUTINE W REFLEX MICROSCOPIC - Abnormal; Notable for the following components:   Leukocytes,Ua SMALL (*)    All other components within normal limits  URINALYSIS, MICROSCOPIC (REFLEX) - Abnormal; Notable for the following components:   Bacteria, UA RARE (*)    All other components within normal limits  ACETAMINOPHEN  LEVEL - Abnormal; Notable for the following components:   Acetaminophen (Tylenol), Serum <10 (*)    All other components within normal limits  LIPASE, BLOOD  ETHANOL  HCV RNA QUANT  AFP TUMOR MARKER    EKG None  Radiology CT ABDOMEN PELVIS W CONTRAST  Result Date: 08/03/2021 CLINICAL DATA:  Abdominal pain and distension for 1 month, diarrhea, right lower quadrant abdominal pain, history of irritable bowel syndrome EXAM: CT ABDOMEN AND PELVIS WITH CONTRAST TECHNIQUE: Multidetector CT imaging of the abdomen and pelvis was performed using the standard protocol following bolus administration of intravenous contrast. CONTRAST:  OMNIPAQUE IOHEXOL 300 MG/ML  SOLN COMPARISON:  11/12/2005 FINDINGS: Lower chest: No acute pleural or parenchymal lung disease. Hepatobiliary: Shrunken nodular appearance of the liver consistent with cirrhosis. There is heterogeneous hypoattenuation throughout the liver parenchyma, which could reflect steatosis. No discrete liver mass on this single phase evaluation. If underlying liver neoplasm is suspected or clinically important to document, nonemergent follow-up outpatient dedicated liver MRI could be performed. No intrahepatic biliary duct dilation. The gallbladder surgically absent. Pancreas: Unremarkable. No pancreatic ductal dilatation or surrounding inflammatory changes. Spleen: Normal in size without focal abnormality. Adrenals/Urinary Tract: Kidneys enhance normally and symmetrically. No urinary tract calculi or obstruction. The  adrenals are unremarkable. Bladder is decompressed, limiting its evaluation. Stomach/Bowel: No bowel obstruction or ileus. Postsurgical changes from prior sigmoid colon resection and reanastomosis. Normal retrocecal appendix. No bowel wall thickening or inflammatory change. Vascular/Lymphatic: No significant vascular findings are present. No enlarged abdominal or pelvic lymph nodes. Reproductive: Status post hysterectomy. No adnexal masses. Other: Moderate ascites most pronounced in the upper abdomen. No free intraperitoneal gas. Postsurgical changes from prior umbilical hernia repair. Musculoskeletal: No acute or destructive bony lesions. Reconstructed images demonstrate no additional findings. IMPRESSION: 1. Cirrhosis, with heterogeneous hypodensities throughout the liver which could reflect steatosis. Evaluation for liver neoplasm is limited on this single phase exam, and if further evaluation is desired a nonemergent outpatient dedicated liver MRI could be performed. 2. Moderate ascites. 3.  Aortic Atherosclerosis (ICD10-I70.0). Electronically Signed   By: Sharlet Salina M.D.   On: 08/03/2021 16:30    Procedures Procedures   Medications Ordered in ED Medications  ondansetron (ZOFRAN) injection 4 mg (4 mg Intravenous Given 08/03/21 1600)  fentaNYL (SUBLIMAZE) injection 50 mcg (50 mcg Intravenous Given 08/03/21 1601)  iohexol (OMNIPAQUE) 300 MG/ML solution 100 mL (100 mLs Intravenous Contrast Given 08/03/21 1622)    ED Course  I have reviewed the triage vital signs and the nursing notes.  Pertinent labs & imaging results that were available during my care of the patient were reviewed by me and considered in my medical decision making (see chart for details).    MDM Rules/Calculators/A&P                         Patient presents abdominal pain.  Has had over the last 3 weeks but worse over the last few days.  States difficulty eating due to the severe pain.  Abdomen more swollen.  Did have a  history of hepatitis C and cirrhosis.  Reportedly does not drink too much per patient.  Denies Tylenol use.  Worsening distention.  Patient was reportedly jaundiced a couple days ago.  Bilirubin mildly elevated.  AST mildly elevated.  Normal alcohol and normal Tylenol levels.  CT scan done due to diffuse tenderness and found to have moderate ascites.  Otherwise nodular appearance of liver consistent with ascites but cannot  rule out mass.  Continued diffuse tenderness.  White count reassuring.  Patient states she has had to use more blankets because she will get chills.  With the new ascites I do not think I can rule out an SBP.  On ultrasound I do not see a good fluid collection for me to tap in the ER.  Patient appears stable for admission but I think she would benefit from coming in the hospital to get a fluid sample potentially seen by GI.  Does have pain and nausea that is feeling better somewhat now after pain medicines and Zofran.  Will discuss with hospitalist.      Final Clinical Impression(s) / ED Diagnoses Final diagnoses:  Generalized abdominal pain  Ascites of liver    Rx / DC Orders ED Discharge Orders     None        Benjiman Core, MD 08/03/21 1752

## 2021-08-03 NOTE — ED Notes (Signed)
Attempted report x1. 

## 2021-08-03 NOTE — H&P (Signed)
History and Physical    Shivaun Nilsen ZOX:[id number] DOB: 1960/10/20 DOA: 12/26/2022Janaa Acerolmer Picker [facility]  Patient coming from: Home   I have personally briefly reviewed patient's old medical records in [facility]  Chief Complaint: Abdominal distention and pain  HPI: Karla Young is a 60 y.o. female with medical history significant of hepatitis C in the past which she reports was treated with antivirals, hypothyroidism, hypertension, reports noting abdominal distention which began approximately a month ago.  Over the past 4 to 5 days, she is noted substantial worsening.  She has developed significant discomfort in her abdomen.  She denies any fevers, but does report having chills in the past few days.  She has not had any vomiting.  She has irritable bowel syndrome and was having issues with diarrhea in the past, but now reports significant constipation.  P.o. intake has been poor.  She had difficulty moving around due to abdominal girth and become short of breath while trying to walk.  She describes diffuse abdominal pain, not specifically localized to 1 area.  She does admit to drinking alcohol, approximately 4 times a week.  She says she has attended meetings in the past to help her quit, but has been unable to do so.  ED Course: In the emergency room, she is noted to have significant abdominal distention.  CT of the abdomen pelvis does show ascites, cirrhotic liver, heterogenous hypodensities throughout the liver.  Bilirubin mildly elevated at 2.3.  She received pain medications for her abdominal discomfort.  Ultrasound was performed by EDP in the emergency room, but it was not felt that she had an accessible large pocket of fluid for easy drainage.  Request was for admission for paracentesis and further GI evaluation  Review of Systems: As per HPI otherwise 10 point review of systems negative.    Past Medical History:  Diagnosis Date   Anxiety    Fibromyalgia     GERD (gastroesophageal reflux disease)    Hepatitis C    Hypertension    Hypothyroid    Migraines    MRSA (methicillin resistant staph aureus) culture positive     Past Surgical History:  Procedure Laterality Date   ABDOMINAL HYSTERECTOMY     partial   BACK SURGERY     lumbar removal of disc   BIOPSY  09/06/2014   Procedure: BIOPSY;  Surgeon: Malissa Hippo, MD;  Location: [facility];  Service: Endoscopy;;   CHOLECYSTECTOMY     COLONOSCOPY WITH PROPOFOL N/A 09/06/2014   Procedure: COLONOSCOPY WITH PROPOFOL (Procedure #2) At cecum at 0819; total withdrawal time=13 minutes;  Surgeon: Malissa Hippo, MD;  Location: [facility];  Service: Endoscopy;  Laterality: N/A;   ESOPHAGEAL DILATION N/A 09/06/2014   Procedure: ESOPHAGEAL DILATION WITH MALONEY DILATOR ;  Surgeon: Malissa Hippo, MD;  Location: [facility];  Service: Endoscopy;  Laterality: N/A;   ESOPHAGOGASTRODUODENOSCOPY (EGD) WITH PROPOFOL N/A 09/06/2014   Procedure: ESOPHAGOGASTRODUODENOSCOPY (EGD) WITH PROPOFOL (Procedure #1) ;  Surgeon: Malissa Hippo, MD;  Location: [facility];  Service: Endoscopy;  Laterality: N/A;   HEMORRHOID SURGERY     Dr Vicenta Aly   PARTIAL HYSTERECTOMY      One ovary left    Social History:  reports that she has been smoking cigarettes. She has a 7.50 pack-year smoking history. She does not have any smokeless tobacco history on file. She reports current alcohol use. She reports that she does not use drugs.  No Known Allergies  Family history: Family history reviewed and not pertinent  Prior to Admission medications   Medication Sig Start Date End Date Taking? Authorizing Provider  Albuterol Sulfate (PROAIR HFA IN) Inhale 1-2 puffs into the lungs daily as needed.   Yes [provider]  diphenhydrAMINE (BENADRYL) 25 MG tablet Take 50 mg by mouth every 6 (six) hours as needed for sleep.   Yes [provider]  levothyroxine (SYNTHROID) 88 MCG tablet Take 88 mcg by mouth daily before  breakfast. 06/12/20  Yes [provider]  lisinopril (PRINIVIL,ZESTRIL) 10 MG tablet Take 10 mg by mouth daily.   Yes [provider]  LYRICA 75 MG capsule Take 1 capsule by mouth daily. 08/06/14  Yes [provider]  omeprazole (PRILOSEC) 40 MG capsule Take 40 mg by mouth 2 (two) times daily.    Yes [provider]  dicyclomine (BENTYL) 10 MG capsule Take 1 capsule (10 mg total) by mouth 3 (three) times daily before meals. Patient not taking: Reported on 08/22/2014 05/13/14   Len Blalock, NP  nystatin-triamcinolone (MYCOLOG II) cream Apply 1 application topically 2 (two) times daily. Patient not taking: Reported on 08/12/2014 05/16/14   Len Blalock, NP  oxyCODONE (OXY IR/ROXICODONE) 5 MG immediate release tablet Take 1 tablet (5 mg total) by mouth 2 (two) times daily as needed for severe pain. Patient not taking: Reported on 08/03/2021 09/06/14   Malissa Hippo, MD    Physical Exam: Vitals:   08/03/21 1321 08/03/21 1323 08/03/21 1543  BP: 135/90  131/89  Pulse: 85  77  Resp: 18  (!) 21  Temp: 98.7 F (37.1 C)    TempSrc: Oral    SpO2: 96%  94%  Weight:  90.7 kg   Height:  5\' 4"  (1.626 m)     Constitutional: NAD, calm, comfortable Eyes: PERRL, lids and conjunctivae normal ENMT: Mucous membranes are moist. Posterior pharynx clear of any exudate or lesions.Normal dentition.  Neck: normal, supple, no masses, no thyromegaly Respiratory: clear to auscultation bilaterally, no wheezing, no crackles. Normal respiratory effort. No accessory muscle use.  Cardiovascular: Regular rate and rhythm, no murmurs / rubs / gallops. Trace extremity edema. 2+ pedal pulses. No carotid bruits.  Abdomen: Diffusely tender, significantly distended, no masses palpated. No hepatosplenomegaly. Bowel sounds positive.  Musculoskeletal: no clubbing / cyanosis. No joint deformity upper and lower extremities. Good ROM, no contractures. Normal muscle tone.  Skin: no rashes,  lesions, ulcers. No induration Neurologic: CN 2-12 grossly intact. Sensation intact, DTR normal. Strength 5/5 in all 4.  Psychiatric: Normal judgment and insight. Alert and oriented x 3. Normal mood.    Labs on Admission: I have personally reviewed following labs and imaging studies  CBC: Recent Labs  Lab 08/03/21 1358  WBC 10.0  HGB 15.0  HCT 43.9  MCV 102.1*  PLT 195   Basic Metabolic Panel: Recent Labs  Lab 08/03/21 1358  NA 135  K 3.5  CL 94*  CO2 32  GLUCOSE 101*  BUN 5*  CREATININE 0.60  CALCIUM 8.4*   GFR: Estimated Creatinine Clearance: 81.6 mL/min (by C-G formula based on SCr of 0.6 mg/dL). Liver Function Tests: Recent Labs  Lab 08/03/21 1358  AST 117*  ALT 26  ALKPHOS 111  BILITOT 2.3*  PROT 6.8  ALBUMIN 2.7*   Recent Labs  Lab 08/03/21 1358  LIPASE 31   No results for input(s): AMMONIA in the last 168 hours. Coagulation Profile: No results for input(s): INR, PROTIME in the  last 168 hours. Cardiac Enzymes: No results for input(s): CKTOTAL, CKMB, CKMBINDEX, TROPONINI in the last 168 hours. BNP (last 3 results) No results for input(s): PROBNP in the last 8760 hours. HbA1C: No results for input(s): HGBA1C in the last 72 hours. CBG: No results for input(s): GLUCAP in the last 168 hours. Lipid Profile: No results for input(s): CHOL, HDL, LDLCALC, TRIG, CHOLHDL, LDLDIRECT in the last 72 hours. Thyroid Function Tests: No results for input(s): TSH, T4TOTAL, FREET4, T3FREE, THYROIDAB in the last 72 hours. Anemia Panel: No results for input(s): VITAMINB12, FOLATE, FERRITIN, TIBC, IRON, RETICCTPCT in the last 72 hours. Urine analysis:    Component Value Date/Time   COLORURINE YELLOW 08/03/2021 1412   APPEARANCEUR CLEAR 08/03/2021 1412   LABSPEC 1.010 08/03/2021 1412   PHURINE 6.0 08/03/2021 1412   GLUCOSEU NEGATIVE 08/03/2021 1412   HGBUR NEGATIVE 08/03/2021 1412   BILIRUBINUR NEGATIVE 08/03/2021 1412   KETONESUR NEGATIVE 08/03/2021 1412    PROTEINUR NEGATIVE 08/03/2021 1412   NITRITE NEGATIVE 08/03/2021 1412   LEUKOCYTESUR SMALL (A) 08/03/2021 1412    Radiological Exams on Admission: CT ABDOMEN PELVIS W CONTRAST  Result Date: 08/03/2021 CLINICAL DATA:  Abdominal pain and distension for 1 month, diarrhea, right lower quadrant abdominal pain, history of irritable bowel syndrome EXAM: CT ABDOMEN AND PELVIS WITH CONTRAST TECHNIQUE: Multidetector CT imaging of the abdomen and pelvis was performed using the standard protocol following bolus administration of intravenous contrast. CONTRAST:  OMNIPAQUE IOHEXOL 300 MG/ML  SOLN COMPARISON:  11/12/2005 FINDINGS: Lower chest: No acute pleural or parenchymal lung disease. Hepatobiliary: Shrunken nodular appearance of the liver consistent with cirrhosis. There is heterogeneous hypoattenuation throughout the liver parenchyma, which could reflect steatosis. No discrete liver mass on this single phase evaluation. If underlying liver neoplasm is suspected or clinically important to document, nonemergent follow-up outpatient dedicated liver MRI could be performed. No intrahepatic biliary duct dilation. The gallbladder surgically absent. Pancreas: Unremarkable. No pancreatic ductal dilatation or surrounding inflammatory changes. Spleen: Normal in size without focal abnormality. Adrenals/Urinary Tract: Kidneys enhance normally and symmetrically. No urinary tract calculi or obstruction. The adrenals are unremarkable. Bladder is decompressed, limiting its evaluation. Stomach/Bowel: No bowel obstruction or ileus. Postsurgical changes from prior sigmoid colon resection and reanastomosis. Normal retrocecal appendix. No bowel wall thickening or inflammatory change. Vascular/Lymphatic: No significant vascular findings are present. No enlarged abdominal or pelvic lymph nodes. Reproductive: Status post hysterectomy. No adnexal masses. Other: Moderate ascites most pronounced in the upper abdomen. No free  intraperitoneal gas. Postsurgical changes from prior umbilical hernia repair. Musculoskeletal: No acute or destructive bony lesions. Reconstructed images demonstrate no additional findings. IMPRESSION: 1. Cirrhosis, with heterogeneous hypodensities throughout the liver which could reflect steatosis. Evaluation for liver neoplasm is limited on this single phase exam, and if further evaluation is desired a nonemergent outpatient dedicated liver MRI could be performed. 2. Moderate ascites. 3.  Aortic Atherosclerosis (ICD10-I70.0). Electronically Signed   By: Sharlet Salina M.D.   On: 08/03/2021 16:30    Assessment/Plan Principal Problem:   Ascites Active Problems:   Hypothyroidism   Other cirrhosis of liver (HCC)   Alcohol abuse   Irritable bowel syndrome (IBS)     Ascites secondary to cirrhosis -We will request ultrasound-guided paracentesis in a.m. -She does have significant abdominal pain and did report chills at home -She does not have any significant leukocytosis -We will cover with Rocephin for now until fluid can be analyzed -We will also send fluid for cytology  Cirrhosis -Prior history of hep C  which patient reports was treated -We will check acute hepatitis panel -She also has a history of alcohol which may be the main cause of her progressive liver disease -She will need to reestablish care with GI will likely need screening endoscopy for varices -We will consult GI -May benefit from beta-blockers and diuretics, but would defer to GI  Heterogenous hypodensities of the liver -We will need further evaluation with MRI -Can consider once ascites has been addressed and she is able to lay flat  Alcohol abuse -Reports history of alcohol withdrawal in the past -Started on CIWA protocol  Hypothyroidism -Continue on Synthroid  Hypertension -On lisinopril -Hold for now since she is normotensive  Irritable bowel syndrome -Reports having constipation lately -We will start on  as needed lactulose  DVT prophylaxis: SCD  Code Status: full codd  Family Communication: discussed with daughter at the bedside  Disposition Plan: discharge home once improved  Consults called: gastroenterology  Admission status: observation, medsurg   Erick Blinks MD [facility]   If 7PM-7AM, please contact night-coverage [email or web address]   08/03/2021, 8:00 PM

## 2021-08-03 NOTE — ED Triage Notes (Signed)
Pt to ED c/o chronic abdominal pain, Over past 3 weeks abdominal distention noticed. Bowel Movements irrgular. hx COPD, Hep C.

## 2021-08-04 ENCOUNTER — Observation Stay (HOSPITAL_COMMUNITY): Payer: Self-pay

## 2021-08-04 ENCOUNTER — Encounter (HOSPITAL_COMMUNITY): Payer: Self-pay | Admitting: Internal Medicine

## 2021-08-04 DIAGNOSIS — R188 Other ascites: Secondary | ICD-10-CM

## 2021-08-04 DIAGNOSIS — R140 Abdominal distension (gaseous): Secondary | ICD-10-CM | POA: Diagnosis present

## 2021-08-04 DIAGNOSIS — K589 Irritable bowel syndrome without diarrhea: Secondary | ICD-10-CM

## 2021-08-04 LAB — CBC
HCT: 43.2 % (ref 36.0–46.0)
Hemoglobin: 14.7 g/dL (ref 12.0–15.0)
MCH: 34.2 pg — ABNORMAL HIGH (ref 26.0–34.0)
MCHC: 34 g/dL (ref 30.0–36.0)
MCV: 100.5 fL — ABNORMAL HIGH (ref 80.0–100.0)
Platelets: 217 10*3/uL (ref 150–400)
RBC: 4.3 MIL/uL (ref 3.87–5.11)
RDW: 15.2 % (ref 11.5–15.5)
WBC: 11.9 10*3/uL — ABNORMAL HIGH (ref 4.0–10.5)
nRBC: 0 % (ref 0.0–0.2)

## 2021-08-04 LAB — HEPATITIS PANEL, ACUTE
HCV Ab: REACTIVE — AB
Hep A IgM: NONREACTIVE
Hep B C IgM: NONREACTIVE
Hepatitis B Surface Ag: NONREACTIVE

## 2021-08-04 LAB — COMPREHENSIVE METABOLIC PANEL
ALT: 25 U/L (ref 0–44)
AST: 107 U/L — ABNORMAL HIGH (ref 15–41)
Albumin: 2.7 g/dL — ABNORMAL LOW (ref 3.5–5.0)
Alkaline Phosphatase: 107 U/L (ref 38–126)
Anion gap: 10 (ref 5–15)
BUN: 6 mg/dL (ref 6–20)
CO2: 29 mmol/L (ref 22–32)
Calcium: 8.4 mg/dL — ABNORMAL LOW (ref 8.9–10.3)
Chloride: 96 mmol/L — ABNORMAL LOW (ref 98–111)
Creatinine, Ser: 0.68 mg/dL (ref 0.44–1.00)
GFR, Estimated: >60 mL/min (ref >60)
Glucose, Bld: 102 mg/dL — ABNORMAL HIGH (ref 70–99)
Potassium: 3.1 mmol/L — ABNORMAL LOW (ref 3.5–5.1)
Sodium: 135 mmol/L (ref 135–145)
Total Bilirubin: 1.9 mg/dL — ABNORMAL HIGH (ref 0.3–1.2)
Total Protein: 6.6 g/dL (ref 6.5–8.1)

## 2021-08-04 LAB — HIV ANTIBODY (ROUTINE TESTING W REFLEX): HIV Screen 4th Generation wRfx: NONREACTIVE

## 2021-08-04 LAB — PROTIME-INR
INR: 1.2 (ref 0.8–1.2)
Prothrombin Time: 15.6 seconds — ABNORMAL HIGH (ref 11.4–15.2)

## 2021-08-04 LAB — MAGNESIUM: Magnesium: 1.7 mg/dL (ref 1.7–2.4)

## 2021-08-04 MED ORDER — NICOTINE 21 MG/24HR TD PT24
21.0000 mg | MEDICATED_PATCH | Freq: Every day | TRANSDERMAL | Status: DC
Start: 2021-08-05 — End: 2021-08-04
  Filled 2021-08-04: qty 1

## 2021-08-04 MED ORDER — FUROSEMIDE 10 MG/ML IJ SOLN
40.0000 mg | Freq: Every day | INTRAMUSCULAR | Status: DC
  Administered 2021-08-04 – 2021-08-06 (×3): 40 mg via INTRAVENOUS
  Filled 2021-08-04 (×3): qty 4

## 2021-08-04 MED ORDER — POTASSIUM CHLORIDE CRYS ER 20 MEQ PO TBCR
40.0000 meq | EXTENDED_RELEASE_TABLET | ORAL | Status: AC
  Administered 2021-08-04 (×2): 40 meq via ORAL
  Filled 2021-08-04 (×2): qty 2

## 2021-08-04 MED ORDER — ALBUMIN HUMAN 25 % IV SOLN
50.0000 g | Freq: Three times a day (TID) | INTRAVENOUS | Status: AC
  Administered 2021-08-04 – 2021-08-05 (×3): 50 g via INTRAVENOUS
  Filled 2021-08-04 (×3): qty 200

## 2021-08-04 MED ORDER — LEVOTHYROXINE SODIUM 88 MCG PO TABS
88.0000 ug | ORAL_TABLET | Freq: Every day | ORAL | Status: DC
  Administered 2021-08-04 – 2021-08-06 (×3): 88 ug via ORAL
  Filled 2021-08-04 (×2): qty 1

## 2021-08-04 MED ORDER — ALBUMIN HUMAN 5 % IV SOLN
25.0000 g | Freq: Three times a day (TID) | INTRAVENOUS | Status: DC
Start: 2021-08-04 — End: 2021-08-04

## 2021-08-04 MED ORDER — POLYETHYLENE GLYCOL 3350 17 G PO PACK
17.0000 g | PACK | Freq: Every day | ORAL | Status: DC
Start: 2021-08-04 — End: 2021-08-04
  Administered 2021-08-04: 16:00:00 17 g via ORAL
  Filled 2021-08-04: qty 1

## 2021-08-04 MED ORDER — NICOTINE 21 MG/24HR TD PT24
21.0000 mg | MEDICATED_PATCH | Freq: Every day | TRANSDERMAL | Status: DC
  Administered 2021-08-04 – 2021-08-06 (×3): 21 mg via TRANSDERMAL
  Filled 2021-08-04 (×2): qty 1

## 2021-08-04 MED ORDER — POLYETHYLENE GLYCOL 3350 17 G PO PACK
17.0000 g | PACK | Freq: Two times a day (BID) | ORAL | Status: DC
Start: 2021-08-04 — End: 2021-08-05
  Administered 2021-08-04 – 2021-08-05 (×2): 17 g via ORAL
  Filled 2021-08-04 (×2): qty 1

## 2021-08-04 MED ORDER — MOMETASONE FURO-FORMOTEROL FUM 200-5 MCG/ACT IN AERO
2.0000 | INHALATION_SPRAY | Freq: Two times a day (BID) | RESPIRATORY_TRACT | Status: DC
  Administered 2021-08-04 – 2021-08-06 (×4): 2 via RESPIRATORY_TRACT
  Filled 2021-08-04: qty 8.8

## 2021-08-04 NOTE — Consult Note (Addendum)
Referring Provider: Dr. Kerry Hough  Primary Care Physician:  [facility] Primary Gastroenterologist:  Dr. Karilyn Cota   Date of Admission: 08/03/21 Date of Consultation: 08/04/21  Reason for Consultation:  Cirrhosis with ascites  HPI:  Karla Young is a 60 y.o. year old female with history of cirrhosis and history of Hep C (treatment failure many years ago and subsequent treatment in 2015 with Viekira and Ribavirin and achieved SVR with undetectable HCV RNA post-treatment), presenting yesterday with abdominal distension. She was lost to GI follow-up, last seen in 2016. She endorses continued ETOH intake, drinking 3-5 Smirnoff drinks a day. No recent labs to compare, but admitting AST elevated at 117, Tbili 2.3, and remaining LFTs unremarkable. Macrocytosis with MCV 102.1. Acute hepatitis panel in process. INR 1.2.   Patient notes abdominal distension starting around Thanksgiving. No lower extremity edema. Worsened abdominal distension prompting ED evaluation. Feels like she can't get a breath in. Has had chills but no fever. Notes history of IBS with alternating constipation and diarrhea. Now more predominant constipation. Believes stool was black right after Thanksgiving. Happened after bout with constipation. Had lactulose from her roommate, but was out of date, so she didn't take it. No mental status changes or confusion. No hematochezia. Last black stool around Thanksgiving after bout with constipation.    EGD Jan 2016: suspicious for Barrett's esophagus (negative path for Barrett's), mild portal gastropathy and gastritis, empiric dilation. Negative H.pylori.   Colonoscopy Jan 2016: normal TI, melanosis coli involving sigmoid colon. Scarring noted 20 cm from anal margin felt to be related to a prior surgery.   Past Medical History:  Diagnosis Date   Anxiety    COPD (chronic obstructive pulmonary disease) (HCC)    Fibromyalgia    GERD (gastroesophageal reflux disease)     Hepatitis C    Hypertension    Hypothyroid    Migraines    MRSA (methicillin resistant staph aureus) culture positive     Past Surgical History:  Procedure Laterality Date   ABDOMINAL HYSTERECTOMY     partial   BACK SURGERY     lumbar removal of disc   BIOPSY  09/06/2014   Procedure: BIOPSY;  Surgeon: Malissa Hippo, MD;  Location: [facility];  Service: Endoscopy;;   CHOLECYSTECTOMY     COLONOSCOPY WITH PROPOFOL N/A 09/06/2014   Procedure: COLONOSCOPY WITH PROPOFOL (Procedure #2) At cecum at 0819; total withdrawal time=13 minutes;  Surgeon: Malissa Hippo, MD;  Location: [facility];  Service: Endoscopy;  Laterality: N/A;   ESOPHAGEAL DILATION N/A 09/06/2014   Procedure: ESOPHAGEAL DILATION WITH MALONEY DILATOR ;  Surgeon: Malissa Hippo, MD;  Location: [facility];  Service: Endoscopy;  Laterality: N/A;   ESOPHAGOGASTRODUODENOSCOPY (EGD) WITH PROPOFOL N/A 09/06/2014   Procedure: ESOPHAGOGASTRODUODENOSCOPY (EGD) WITH PROPOFOL (Procedure #1) ;  Surgeon: Malissa Hippo, MD;  Location: [facility];  Service: Endoscopy;  Laterality: N/A;   HEMORRHOID SURGERY     Dr Vicenta Aly   PARTIAL HYSTERECTOMY      One ovary left   RECTAL PROLAPSE REPAIR      Prior to Admission medications   Medication Sig Start Date End Date Taking? Authorizing Provider  Albuterol Sulfate (PROAIR HFA IN) Inhale 1-2 puffs into the lungs daily as needed.   Yes [provider]  diphenhydrAMINE (BENADRYL) 25 MG tablet Take 50 mg by mouth every 6 (six) hours as needed for sleep.   Yes [provider]  levothyroxine (SYNTHROID) 88 MCG tablet Take 88 mcg by mouth  daily before breakfast. 06/12/20  Yes [provider]  lisinopril (PRINIVIL,ZESTRIL) 10 MG tablet Take 10 mg by mouth daily.   Yes [provider]  LYRICA 75 MG capsule Take 1 capsule by mouth daily. 08/06/14  Yes [provider]  omeprazole (PRILOSEC) 40 MG capsule Take 40 mg by mouth 2 (two) times daily.    Yes [provider]  dicyclomine (BENTYL) 10 MG capsule Take 1 capsule (10 mg total) by mouth 3 (three) times daily before meals. Patient not taking: Reported on 08/22/2014 05/13/14   Len Blalock, NP  nystatin-triamcinolone (MYCOLOG II) cream Apply 1 application topically 2 (two) times daily. Patient not taking: Reported on 08/12/2014 05/16/14   Len Blalock, NP  oxyCODONE (OXY IR/ROXICODONE) 5 MG immediate release tablet Take 1 tablet (5 mg total) by mouth 2 (two) times daily as needed for severe pain. Patient not taking: Reported on 08/03/2021 09/06/14   Malissa Hippo, MD    Current Facility-Administered Medications  Medication Dose Route Frequency Provider Last Rate Last Admin   0.9 %  sodium chloride infusion  250 mL Intravenous PRN Erick Blinks, MD       acetaminophen (TYLENOL) tablet 650 mg  650 mg Oral Q6H PRN Erick Blinks, MD   650 mg at 08/04/21 0117   Or   acetaminophen (TYLENOL) suppository 650 mg  650 mg Rectal Q6H PRN Erick Blinks, MD       albuterol (PROVENTIL) (2.5 MG/3ML) 0.083% nebulizer solution 2.5 mg  2.5 mg Nebulization Q2H PRN Erick Blinks, MD       cefTRIAXone (ROCEPHIN) 2 g in sodium chloride 0.9 % 100 mL IVPB  2 g Intravenous Q24H Erick Blinks, MD 200 mL/hr at 08/03/21 2042 2 g at 08/03/21 2042   folic acid (FOLVITE) tablet 1 mg  1 mg Oral Daily Erick Blinks, MD   1 mg at 08/04/21 1103   levothyroxine (SYNTHROID) tablet 88 mcg  88 mcg Oral Q0600 Erick Blinks, MD   88 mcg at 08/04/21 1308   LORazepam (ATIVAN) injection 0-4 mg  0-4 mg Intravenous Q6H Erick Blinks, MD   1 mg at 08/03/21 2130   Followed by   Melene Muller ON 08/05/2021] LORazepam (ATIVAN) injection 0-4 mg  0-4 mg Intravenous Q12H Erick Blinks, MD       LORazepam (ATIVAN) tablet 1-4 mg  1-4 mg Oral Q1H PRN Erick Blinks, MD       Or   LORazepam (ATIVAN) injection 1-4 mg  1-4 mg Intravenous Q1H PRN Erick Blinks, MD       morphine 2 MG/ML injection 2 mg  2 mg Intravenous Q2H PRN  Erick Blinks, MD   2 mg at 08/04/21 6578   multivitamin with minerals tablet 1 tablet  1 tablet Oral Daily Erick Blinks, MD   1 tablet at 08/04/21 1103   ondansetron (ZOFRAN) tablet 4 mg  4 mg Oral Q6H PRN Erick Blinks, MD       Or   ondansetron (ZOFRAN) injection 4 mg  4 mg Intravenous Q6H PRN Erick Blinks, MD       pantoprazole (PROTONIX) EC tablet 40 mg  40 mg Oral BID AC Memon, Durward Mallard, MD   40 mg at 08/04/21 1105   pregabalin (LYRICA) capsule 75 mg  75 mg Oral Daily Erick Blinks, MD   75 mg at 08/04/21 1103   sodium chloride flush (NS) 0.9 % injection 3 mL  3 mL Intravenous Q12H Erick Blinks, MD   3 mL at  08/03/21 2118   sodium chloride flush (NS) 0.9 % injection 3 mL  3 mL Intravenous PRN Erick Blinks, MD       thiamine tablet 100 mg  100 mg Oral Daily Erick Blinks, MD   100 mg at 08/04/21 1104   Or   thiamine (B-1) injection 100 mg  100 mg Intravenous Daily Erick Blinks, MD        Allergies as of 08/03/2021   (No Known Allergies)    Family History  Problem Relation Age of Onset   Colon cancer Neg Hx    Colon polyps Neg Hx     Social History   Socioeconomic History   Marital status: Widowed    Spouse name: Not on file   Number of children: Not on file   Years of education: Not on file   Highest education level: Not on file  Occupational History   Not on file  Tobacco Use   Smoking status: Every Day    Packs/day: 0.25    Years: 30.00    Pack years: 7.50    Types: Cigarettes   Smokeless tobacco: Not on file   Tobacco comments:    3-4 cigs a day since age 18.  Substance and Sexual Activity   Alcohol use: Yes    Alcohol/week: 0.0 standard drinks    Comment: occasionally   Drug use: No   Sexual activity: Yes    Birth control/protection: Surgical  Other Topics Concern   Not on file  Social History Narrative   Not on file   Social Determinants of Health   Financial Resource Strain: Not on file  Food Insecurity: Not on file   Transportation Needs: Not on file  Physical Activity: Not on file  Stress: Not on file  Social Connections: Not on file  Intimate Partner Violence: Not on file    Review of Systems: Gen: Denies fever, chills, loss of appetite, change in weight or weight loss CV: Denies chest pain, heart palpitations, syncope, edema  Resp: Denies shortness of breath with rest, cough, wheezing GI: see HPI GU : Denies urinary burning, urinary frequency, urinary incontinence.  MS: Denies joint pain,swelling, cramping Derm: Denies rash, itching, dry skin Psych: Denies depression, anxiety,confusion, or memory loss Heme: Denies bruising, bleeding, and enlarged lymph nodes.  Physical Exam: Vital signs in last 24 hours: Temp:  [98.1 F (36.7 C)-99.3 F (37.4 C)] 99.3 F (37.4 C) (12/27 0451) Pulse Rate:  [72-85] 78 (12/27 0451) Resp:  [18-21] 18 (12/27 0451) BP: (124-135)/(68-92) 129/84 (12/27 0451) SpO2:  [94 %-100 %] 96 % (12/27 0451) Weight:  [90.7 kg-91.2 kg] 91.2 kg (12/26 2113)   General:   Alert,  Well-developed, well-nourished, pleasant and cooperative in NAD Head:  Normocephalic and atraumatic. Eyes:  Sclera clear, no icterus.   Conjunctiva pink. Ears:  Normal auditory acuity. Nose:  No deformity, discharge,  or lesions. Lungs:  Clear throughout to auscultation.    Heart:  S1 S2 present without murmurs Abdomen:  Distended most prominently upper abdomen, soft, unable to appreciate HSM due to larger AP diameter.  Rectal:  Deferred until time of colonoscopy.   Msk:  Symmetrical without gross deformities. Normal posture. Extremities:  Without edema. Neurologic:  Alert and  oriented x4 Psych:  Alert and cooperative. Normal mood and affect.  Intake/Output from previous day: No intake/output data recorded. Intake/Output this shift: No intake/output data recorded.  Lab Results: Recent Labs    08/03/21 1358 08/04/21 0542  WBC 10.0 11.9*  HGB  15.0 14.7  HCT 43.9 43.2  PLT 195 217    BMET Recent Labs    08/03/21 1358 08/04/21 0542  NA 135 135  K 3.5 3.1*  CL 94* 96*  CO2 32 29  GLUCOSE 101* 102*  BUN 5* 6  CREATININE 0.60 0.68  CALCIUM 8.4* 8.4*   LFT Recent Labs    08/03/21 1358 08/04/21 0542  PROT 6.8 6.6  ALBUMIN 2.7* 2.7*  AST 117* 107*  ALT 26 25  ALKPHOS 111 107  BILITOT 2.3* 1.9*   PT/INR Recent Labs    08/04/21 0542  LABPROT 15.6*  INR 1.2    Studies/Results: CT ABDOMEN PELVIS W CONTRAST  Result Date: 08/03/2021 CLINICAL DATA:  Abdominal pain and distension for 1 month, diarrhea, right lower quadrant abdominal pain, history of irritable bowel syndrome EXAM: CT ABDOMEN AND PELVIS WITH CONTRAST TECHNIQUE: Multidetector CT imaging of the abdomen and pelvis was performed using the standard protocol following bolus administration of intravenous contrast. CONTRAST:  OMNIPAQUE IOHEXOL 300 MG/ML  SOLN COMPARISON:  11/12/2005 FINDINGS: Lower chest: No acute pleural or parenchymal lung disease. Hepatobiliary: Shrunken nodular appearance of the liver consistent with cirrhosis. There is heterogeneous hypoattenuation throughout the liver parenchyma, which could reflect steatosis. No discrete liver mass on this single phase evaluation. If underlying liver neoplasm is suspected or clinically important to document, nonemergent follow-up outpatient dedicated liver MRI could be performed. No intrahepatic biliary duct dilation. The gallbladder surgically absent. Pancreas: Unremarkable. No pancreatic ductal dilatation or surrounding inflammatory changes. Spleen: Normal in size without focal abnormality. Adrenals/Urinary Tract: Kidneys enhance normally and symmetrically. No urinary tract calculi or obstruction. The adrenals are unremarkable. Bladder is decompressed, limiting its evaluation. Stomach/Bowel: No bowel obstruction or ileus. Postsurgical changes from prior sigmoid colon resection and reanastomosis. Normal retrocecal appendix. No bowel wall thickening  or inflammatory change. Vascular/Lymphatic: No significant vascular findings are present. No enlarged abdominal or pelvic lymph nodes. Reproductive: Status post hysterectomy. No adnexal masses. Other: Moderate ascites most pronounced in the upper abdomen. No free intraperitoneal gas. Postsurgical changes from prior umbilical hernia repair. Musculoskeletal: No acute or destructive bony lesions. Reconstructed images demonstrate no additional findings. IMPRESSION: 1. Cirrhosis, with heterogeneous hypodensities throughout the liver which could reflect steatosis. Evaluation for liver neoplasm is limited on this single phase exam, and if further evaluation is desired a nonemergent outpatient dedicated liver MRI could be performed. 2. Moderate ascites. 3.  Aortic Atherosclerosis (ICD10-I70.0). Electronically Signed   By: Sharlet Salina M.D.   On: 08/03/2021 16:30   [place] ASCITES (ABDOMEN LIMITED)  Result Date: 08/04/2021 CLINICAL DATA:  Ascites EXAM: LIMITED ABDOMEN ULTRASOUND FOR ASCITES TECHNIQUE: Limited ultrasound survey for ascites was performed in all four abdominal quadrants. COMPARISON:  CT abdomen 08/03/2021 FINDINGS: Only small pockets of ascites are appreciable in the right and left abdomen, such that therapeutic paracentesis would probably have little effect. Moreover, there superficial loops of bowel in these pockets increasing risk of bowel injury for paracentesis at this time. Accordingly, paracentesis was not performed. IMPRESSION: 1. Only small pockets of ascites are present in the right and left abdomen as noted above. Electronically Signed   By: Gaylyn Rong M.D.   On: 08/04/2021 11:09    Impression: 60 y.o. year old female with history of cirrhosis, Hep C (treatment failure many years ago and subsequent treatment in 2015 with Viekira and Ribavirin and achieved SVR with undetectable HCV RNA post-treatment), alcohol abuse, presenting yesterday with abdominal distension. She was lost to GI  follow-up,  last seen in 2016. She endorses continued ETOH intake, drinking 3-5 Smirnoff drinks a day. No recent labs to compare, but admitting AST elevated at 117, Tbili 2.3, and remaining LFTs unremarkable. Macrocytosis with MCV 102.1. Acute hepatitis panel in process. INR 1.2.   Abdominal distension: she denies outright pain to me but describes more as uncomfortable and distended. Interestingly, limited US showed only small pockets of ascites that were unable to be reached safely. She has been afebrile although describes chills prior to admission but does not appear acutely ill. Doubt SBP but unable to exclude. Agree with empiric antibiotics that were started yesterday on admission.   Cirrhosis: in setting of Hep C s/p treatment and ETOH use. MELD 11. LFT pattern consistent with alcohol intake and likely component of alcoholic hepatitis but without need for steroids due to low DF (13.9 with control of 13). Acute hepatitis panel pending and HCV RNA in process (previously SVR documented in 2016). Doubt viral hepatitis as she describes no risk factors. No encephalopathy.   ETOH use: we discussed absolute cessation in light of cirrhosis. She states understanding and desires assistance with this. Consult to Transition of Care Team already in place to assist with substance abuse counseling.   Constipation: will start Miralax daily. May need to consider Linzess. Holding off on lactulose as not encephalopathic, although this could be considered in future if needed.   Reported black stool: at Thanksgiving. No overt GI bleeding currently. Hgb stable. Monitor. Doubt was true melena as she noted this after a bout with notable constipation.   Hypokalemia: per attending  Plan: 2 gram sodium diet Follow-up on pending viral hepatitis labs, HCV RNA Follow LFTs, INR Agree with Rocephin Once hypokalemia improved, can consider low dose diuretics and would need to follow renal function/electrolytes closely) Will  need outpatient EGD for variceal screening (last in 2016) Start Miralax daily  Absolute ETOH cessation.  Will continue to follow with you Consider dedicated liver imaging (MRI) as outpatient due to heterogeneous hypodensities in liver, query steatosis  Gelene Mink, PhD, ANP-BC [facility]       LOS: 0 days    08/04/2021, 1:12 PM

## 2021-08-04 NOTE — Progress Notes (Signed)
°  Transition of Care [facility]) Screening Note   Patient Details  Name: Karla Young Date of Birth: 1961-03-15   Transition [facility]) CM/SW Contact:    Annice Needy, LCSW Phone Number: 08/04/2021, 3:35 PM    Transition of Care Department [facility]) has reviewed patient and no TOC needs have been identified at this time. We will continue to monitor patient advancement through interdisciplinary progression rounds. If new patient transition needs arise, please place a TOC consult.   Winefred Hillesheim, Juleen China, LCSW

## 2021-08-04 NOTE — Progress Notes (Signed)
PROGRESS NOTE    Karla Young  URK:[id number] DOB: 1961-05-18 DOA: 08/03/2021 PCP: Elmer Picker, [facility]    Brief Narrative:  Karla Young is a 60 y.o. female with medical history significant of hepatitis C in the past which she reports was treated with antivirals, hypothyroidism, hypertension, reports noting abdominal distention which began approximately a month ago.  Over the past 4 to 5 days, she is noted substantial worsening.  She has developed significant discomfort in her abdomen.  She denies any fevers, but does report having chills in the past few days.  She has not had any vomiting.  She has irritable bowel syndrome and was having issues with diarrhea in the past, but now reports significant constipation.  P.o. intake has been poor.  She had difficulty moving around due to abdominal girth and become short of breath while trying to walk.  She describes diffuse abdominal pain, not specifically localized to 1 area.  She does admit to drinking alcohol, approximately 4 times a week.  She says she has attended meetings in the past to help her quit, but has been unable to do so.   Assessment & Plan:   Principal Problem:   Ascites of liver Active Problems:   Hypothyroidism   Other cirrhosis of liver (HCC)   Alcohol abuse   Irritable bowel syndrome (IBS)   Abdominal distention   Abdominal distention and discomfort -Initially felt to be related to ascites and patient was scheduled for paracentesis -It appears on ultrasound evaluation, she was only noted to have small pockets of fluid, not amenable to paracentesis -Seen by GI it was felt that her distention may be related to significant gas/stool burden -Started on MiraLAX   Cirrhosis -Prior history of hep C which patient reports was treated -Acute viral hepatitis panel positive for HCV antibody.  HCV RNA in process. -She also has a history of alcohol which may be the main cause of her progressive liver disease -GI  following -With presumed ascites, abdominal pain and the fact that she was having chills, she was started on ceftriaxone for possible SBP   Heterogenous hypodensities of the liver -We will likely need further outpatient evaluation with MRI   Alcohol abuse -Reports history of alcohol withdrawal in the past -Started on CIWA protocol -We will request TOC to offer resources regarding alcohol rehabilitation  COPD -Continue on steroid inhalers -Albuterol as needed   Hypothyroidism -Continue on Synthroid   Hypertension -On lisinopril -Hold for now since she is normotensive   Irritable bowel syndrome -Reports having constipation lately -Started on MiraLAX   DVT prophylaxis: SCDs Start: 08/03/21 1956  Code Status: Full code Family Communication: Updated daughter at the bedside Disposition Plan: Status is: Inpatient  Remains inpatient appropriate because: Continued abdominal distention and pain         Consultants:  Gastroenterology  Procedures:    Antimicrobials:  Ceftriaxone 12/26>   Subjective: She reports persistent abdominal distention and she is uncomfortable.  Objective: Vitals:   08/04/21 0113 08/04/21 0200 08/04/21 0451 08/04/21 1422  BP: 130/68 130/68 129/84 (!) 111/92  Pulse: 79 79 78 69  Resp: 19  18 16   Temp: 98.4 F (36.9 C)  99.3 F (37.4 C) 98.7 F (37.1 C)  TempSrc: Oral  Oral Oral  SpO2: 100%  96% 96%  Weight:      Height:        Intake/Output Summary (Last 24 hours) at 08/04/2021 2007 Last data filed at 08/04/2021 1842 Gross per 24 hour  Intake 570.5 ml  Output --  Net 570.5 ml   Filed Weights   08/03/21 1323 08/03/21 2113  Weight: 90.7 kg 91.2 kg    Examination:  General exam: Appears calm and comfortable  Respiratory system: Clear to auscultation. Respiratory effort normal. Cardiovascular system: S1 & S2 heard, RRR. No JVD, murmurs, rubs, gallops or clicks.  Trace pedal edema. Gastrointestinal system: Abdomen is  distended, soft and nontender. No organomegaly or masses felt. Normal bowel sounds heard. Central nervous system: Alert and oriented. No focal neurological deficits. Extremities: Symmetric 5 x 5 power. Skin: No rashes, lesions or ulcers Psychiatry: Judgement and insight appear normal. Mood & affect appropriate.     Data Reviewed: I have personally reviewed following labs and imaging studies  CBC: Recent Labs  Lab 08/03/21 1358 08/04/21 0542  WBC 10.0 11.9*  HGB 15.0 14.7  HCT 43.9 43.2  MCV 102.1* 100.5*  PLT 195 217   Basic Metabolic Panel: Recent Labs  Lab 08/03/21 1358 08/04/21 0542  NA 135 135  K 3.5 3.1*  CL 94* 96*  CO2 32 29  GLUCOSE 101* 102*  BUN 5* 6  CREATININE 0.60 0.68  CALCIUM 8.4* 8.4*  MG  --  1.7   GFR: Estimated Creatinine Clearance: 81.8 mL/min (by C-G formula based on SCr of 0.68 mg/dL). Liver Function Tests: Recent Labs  Lab 08/03/21 1358 08/04/21 0542  AST 117* 107*  ALT 26 25  ALKPHOS 111 107  BILITOT 2.3* 1.9*  PROT 6.8 6.6  ALBUMIN 2.7* 2.7*   Recent Labs  Lab 08/03/21 1358  LIPASE 31   No results for input(s): AMMONIA in the last 168 hours. Coagulation Profile: Recent Labs  Lab 08/04/21 0542  INR 1.2   Cardiac Enzymes: No results for input(s): CKTOTAL, CKMB, CKMBINDEX, TROPONINI in the last 168 hours. BNP (last 3 results) No results for input(s): PROBNP in the last 8760 hours. HbA1C: No results for input(s): HGBA1C in the last 72 hours. CBG: No results for input(s): GLUCAP in the last 168 hours. Lipid Profile: No results for input(s): CHOL, HDL, LDLCALC, TRIG, CHOLHDL, LDLDIRECT in the last 72 hours. Thyroid Function Tests: No results for input(s): TSH, T4TOTAL, FREET4, T3FREE, THYROIDAB in the last 72 hours. Anemia Panel: No results for input(s): VITAMINB12, FOLATE, FERRITIN, TIBC, IRON, RETICCTPCT in the last 72 hours. Sepsis Labs: No results for input(s): PROCALCITON, LATICACIDVEN in the last 168  hours.  Recent Results (from the past 240 hour(s))  Resp Panel by RT-PCR (Flu A&B, Covid) Nasopharyngeal Swab     Status: None   Collection Time: 08/03/21  7:27 PM   Specimen: Nasopharyngeal Swab; Nasopharyngeal(NP) swabs in vial transport medium  Result Value Ref Range Status   SARS Coronavirus 2 by RT PCR NEGATIVE NEGATIVE Final    Comment: (NOTE) SARS-CoV-2 target nucleic acids are NOT DETECTED.  The SARS-CoV-2 RNA is generally detectable in upper respiratory specimens during the acute phase of infection. The lowest concentration of SARS-CoV-2 viral copies this assay can detect is 138 copies/mL. A negative result does not preclude SARS-Cov-2 infection and should not be used as the sole basis for treatment or other patient management decisions. A negative result may occur with  improper specimen collection/handling, submission of specimen other than nasopharyngeal swab, presence of viral mutation(s) within the areas targeted by this assay, and inadequate number of viral copies(<138 copies/mL). A negative result must be combined with clinical observations, patient history, and epidemiological information. The expected result is Negative.  Fact Sheet for Patients:  BloggerCourse.com  Fact Sheet for Healthcare Providers:  SeriousBroker.it  This test is no t yet approved or cleared by the [place] FDA and  has been authorized for detection and/or diagnosis of SARS-CoV-2 by FDA under an Emergency Use Authorization (EUA). This EUA will remain  in effect (meaning this test can be used) for the duration of the COVID-19 declaration under Section 564(b)(1) of the Act, 21 U.S.C.section 360bbb-3(b)(1), unless the authorization is terminated  or revoked sooner.       Influenza A by PCR NEGATIVE NEGATIVE Final   Influenza B by PCR NEGATIVE NEGATIVE Final    Comment: (NOTE) The Xpert Xpress SARS-CoV-2/FLU/RSV plus assay is intended  as an aid in the diagnosis of influenza from Nasopharyngeal swab specimens and should not be used as a sole basis for treatment. Nasal washings and aspirates are unacceptable for Xpert Xpress SARS-CoV-2/FLU/RSV testing.  Fact Sheet for Patients: BloggerCourse.com  Fact Sheet for Healthcare Providers: SeriousBroker.it  This test is not yet approved or cleared by the [place] FDA and has been authorized for detection and/or diagnosis of SARS-CoV-2 by FDA under an Emergency Use Authorization (EUA). This EUA will remain in effect (meaning this test can be used) for the duration of the COVID-19 declaration under Section 564(b)(1) of the Act, 21 U.S.C. section 360bbb-3(b)(1), unless the authorization is terminated or revoked.  Performed at [facility], [address] [place], [place] [address]          Radiology Studies: CT ABDOMEN PELVIS W CONTRAST  Result Date: 08/03/2021 CLINICAL DATA:  Abdominal pain and distension for 1 month, diarrhea, right lower quadrant abdominal pain, history of irritable bowel syndrome EXAM: CT ABDOMEN AND PELVIS WITH CONTRAST TECHNIQUE: Multidetector CT imaging of the abdomen and pelvis was performed using the standard protocol following bolus administration of intravenous contrast. CONTRAST:  OMNIPAQUE IOHEXOL 300 MG/ML  SOLN COMPARISON:  11/12/2005 FINDINGS: Lower chest: No acute pleural or parenchymal lung disease. Hepatobiliary: Shrunken nodular appearance of the liver consistent with cirrhosis. There is heterogeneous hypoattenuation throughout the liver parenchyma, which could reflect steatosis. No discrete liver mass on this single phase evaluation. If underlying liver neoplasm is suspected or clinically important to document, nonemergent follow-up outpatient dedicated liver MRI could be performed. No intrahepatic biliary duct dilation. The gallbladder surgically absent. Pancreas: Unremarkable.  No pancreatic ductal dilatation or surrounding inflammatory changes. Spleen: Normal in size without focal abnormality. Adrenals/Urinary Tract: Kidneys enhance normally and symmetrically. No urinary tract calculi or obstruction. The adrenals are unremarkable. Bladder is decompressed, limiting its evaluation. Stomach/Bowel: No bowel obstruction or ileus. Postsurgical changes from prior sigmoid colon resection and reanastomosis. Normal retrocecal appendix. No bowel wall thickening or inflammatory change. Vascular/Lymphatic: No significant vascular findings are present. No enlarged abdominal or pelvic lymph nodes. Reproductive: Status post hysterectomy. No adnexal masses. Other: Moderate ascites most pronounced in the upper abdomen. No free intraperitoneal gas. Postsurgical changes from prior umbilical hernia repair. Musculoskeletal: No acute or destructive bony lesions. Reconstructed images demonstrate no additional findings. IMPRESSION: 1. Cirrhosis, with heterogeneous hypodensities throughout the liver which could reflect steatosis. Evaluation for liver neoplasm is limited on this single phase exam, and if further evaluation is desired a nonemergent outpatient dedicated liver MRI could be performed. 2. Moderate ascites. 3.  Aortic Atherosclerosis (ICD10-I70.0). Electronically Signed   By: Sharlet Salina M.D.   On: 08/03/2021 16:30   [place] ASCITES (ABDOMEN LIMITED)  Result Date: 08/04/2021 CLINICAL DATA:  Ascites EXAM: LIMITED ABDOMEN ULTRASOUND FOR ASCITES TECHNIQUE: Limited ultrasound survey  for ascites was performed in all four abdominal quadrants. COMPARISON:  CT abdomen 08/03/2021 FINDINGS: Only small pockets of ascites are appreciable in the right and left abdomen, such that therapeutic paracentesis would probably have little effect. Moreover, there superficial loops of bowel in these pockets increasing risk of bowel injury for paracentesis at this time. Accordingly, paracentesis was not performed. IMPRESSION:  1. Only small pockets of ascites are present in the right and left abdomen as noted above. Electronically Signed   By: Gaylyn Rong M.D.   On: 08/04/2021 11:09        Scheduled Meds:  folic acid  1 mg Oral Daily   furosemide  40 mg Intravenous Daily   levothyroxine  88 mcg Oral Q0600   LORazepam  0-4 mg Intravenous Q6H   Followed by   Melene Muller ON 08/05/2021] LORazepam  0-4 mg Intravenous Q12H   mometasone-formoterol  2 puff Inhalation BID   multivitamin with minerals  1 tablet Oral Daily   pantoprazole  40 mg Oral BID AC   polyethylene glycol  17 g Oral Daily   pregabalin  75 mg Oral Daily   sodium chloride flush  3 mL Intravenous Q12H   thiamine  100 mg Oral Daily   Or   thiamine  100 mg Intravenous Daily   Continuous Infusions:  sodium chloride     albumin human     cefTRIAXone (ROCEPHIN)  IV 2 g (08/03/21 2042)     LOS: 0 days    Time spent:    Erick Blinks, MD [facility]   If 7PM-7AM, please contact night-coverage [email or web address]  08/04/2021, 8:07 PM

## 2021-08-05 DIAGNOSIS — K5900 Constipation, unspecified: Secondary | ICD-10-CM | POA: Diagnosis present

## 2021-08-05 DIAGNOSIS — K7460 Unspecified cirrhosis of liver: Secondary | ICD-10-CM

## 2021-08-05 LAB — COMPREHENSIVE METABOLIC PANEL
ALT: 19 U/L (ref 0–44)
AST: 89 U/L — ABNORMAL HIGH (ref 15–41)
Albumin: 3.2 g/dL — ABNORMAL LOW (ref 3.5–5.0)
Alkaline Phosphatase: 81 U/L (ref 38–126)
Anion gap: 10 (ref 5–15)
BUN: 6 mg/dL (ref 6–20)
CO2: 29 mmol/L (ref 22–32)
Calcium: 8.3 mg/dL — ABNORMAL LOW (ref 8.9–10.3)
Chloride: 97 mmol/L — ABNORMAL LOW (ref 98–111)
Creatinine, Ser: 0.7 mg/dL (ref 0.44–1.00)
GFR, Estimated: >60 mL/min (ref >60)
Glucose, Bld: 92 mg/dL (ref 70–99)
Potassium: 3.6 mmol/L (ref 3.5–5.1)
Sodium: 136 mmol/L (ref 135–145)
Total Bilirubin: 1.9 mg/dL — ABNORMAL HIGH (ref 0.3–1.2)
Total Protein: 6.2 g/dL — ABNORMAL LOW (ref 6.5–8.1)

## 2021-08-05 LAB — CBC
HCT: 39.1 % (ref 36.0–46.0)
Hemoglobin: 12.5 g/dL (ref 12.0–15.0)
MCH: 33.6 pg (ref 26.0–34.0)
MCHC: 32 g/dL (ref 30.0–36.0)
MCV: 105.1 fL — ABNORMAL HIGH (ref 80.0–100.0)
Platelets: 143 10*3/uL — ABNORMAL LOW (ref 150–400)
RBC: 3.72 MIL/uL — ABNORMAL LOW (ref 3.87–5.11)
RDW: 14.9 % (ref 11.5–15.5)
WBC: 6.9 10*3/uL (ref 4.0–10.5)
nRBC: 0 % (ref 0.0–0.2)

## 2021-08-05 LAB — MAGNESIUM: Magnesium: 1.8 mg/dL (ref 1.7–2.4)

## 2021-08-05 LAB — HCV RNA QUANT: HCV Quantitative: NOT DETECTED IU/mL (ref >50)

## 2021-08-05 LAB — AFP TUMOR MARKER: AFP, Serum, Tumor Marker: 6 ng/mL (ref 0.0–9.2)

## 2021-08-05 MED ORDER — SIMETHICONE 80 MG PO CHEW
80.0000 mg | CHEWABLE_TABLET | Freq: Four times a day (QID) | ORAL | Status: AC
  Administered 2021-08-05 – 2021-08-06 (×4): 80 mg via ORAL
  Filled 2021-08-05 (×4): qty 1

## 2021-08-05 MED ORDER — LINACLOTIDE 145 MCG PO CAPS
290.0000 ug | ORAL_CAPSULE | Freq: Every day | ORAL | Status: DC
  Administered 2021-08-06: 09:00:00 290 ug via ORAL
  Filled 2021-08-05: qty 2

## 2021-08-05 MED ORDER — LACTULOSE 10 GM/15ML PO SOLN
60.0000 g | Freq: Once | ORAL | Status: AC
  Administered 2021-08-05: 10:00:00 60 g via ORAL
  Filled 2021-08-05: qty 90

## 2021-08-05 MED ORDER — LACTULOSE 10 GM/15ML PO SOLN
45.0000 g | Freq: Two times a day (BID) | ORAL | Status: DC
  Administered 2021-08-05: 22:00:00 45 g via ORAL
  Filled 2021-08-05 (×2): qty 90

## 2021-08-05 MED ORDER — FLEET ENEMA 7-19 GM/118ML RE ENEM
1.0000 | ENEMA | Freq: Once | RECTAL | Status: AC
  Administered 2021-08-05: 12:00:00 1 via RECTAL

## 2021-08-05 MED ORDER — LACTULOSE 10 GM/15ML PO SOLN
30.0000 g | Freq: Two times a day (BID) | ORAL | Status: DC
Start: 2021-08-05 — End: 2021-08-05

## 2021-08-05 MED ORDER — TRAMADOL HCL 50 MG PO TABS
50.0000 mg | ORAL_TABLET | Freq: Four times a day (QID) | ORAL | Status: DC | PRN
  Administered 2021-08-05 – 2021-08-06 (×4): 50 mg via ORAL
  Filled 2021-08-05 (×4): qty 1

## 2021-08-05 NOTE — Progress Notes (Signed)
PROGRESS NOTE    Karla Young  XFG:[id number] DOB: July 20, 1961 DOA: 08/03/2021 PCP: Elmer Picker, [facility]    Brief Narrative:  Karla Young is a 60 y.o. female with medical history significant of hepatitis C in the past which she reports was treated with antivirals, hypothyroidism, hypertension, reports noting abdominal distention which began approximately a month ago.  Over the past 4 to 5 days, she is noted substantial worsening.  She has developed significant discomfort in her abdomen.  She denies any fevers, but does report having chills in the past few days.  She has not had any vomiting.  She has irritable bowel syndrome and was having issues with diarrhea in the past, but now reports significant constipation.  P.o. intake has been poor.  She had difficulty moving around due to abdominal girth and become short of breath while trying to walk.  She describes diffuse abdominal pain, not specifically localized to 1 area.  She does admit to drinking alcohol, approximately 4 times a week.  She says she has attended meetings in the past to help her quit, but has been unable to do so.   Assessment & Plan:   Principal Problem:   Ascites of liver Active Problems:   Hypothyroidism   Cirrhosis of liver with ascites (HCC)   Alcohol abuse   Irritable bowel syndrome (IBS)   Abdominal distention   Constipation   Abdominal distention and discomfort/constipation - abdominal ultrasound does not show enough ascites for paracentesis urden -Lactulose and MiraLAX as ordered for constipation -GI service plans to switch [place]/   Alcoholic liver cirrhosis -Prior history of hep C which patient reports was treated -Acute viral hepatitis panel positive for HCV antibody.  HCV RNA in process - patient continues to drink alcohol -C/N ceftriaxone for possible SBP   Heterogenous hypodensities of the liver -We will likely need further outpatient evaluation with MRI   Alcohol  abuse -Reports history of alcohol withdrawal in the past -C/N  CIWA protocol -TOC to offer resources regarding alcohol rehabilitation  COPD -Continue on steroid inhalers -Albuterol as needed   Hypothyroidism -Continue on Synthroid   Hypertension -On lisinopril -Hold for now since she is normotensive   Irritable bowel syndrome -Reports having constipation lately -GI service would like to switch her to Linzess   DVT prophylaxis: SCDs Start: 08/03/21 1956  Code Status: Full code Family Communication: Updated daughter by phone   disposition Plan: Status is: Inpatient  Remains inpatient appropriate because: Continued abdominal distention and pain   Consultants:  Gastroenterology  Procedures:    Antimicrobials:  Ceftriaxone 12/26>   Subjective: -Constipation AND abdominal discomfort persist -Patient requesting additional pain medications -Spoke with patient's daughter by phone from patient's phone in the room -  Objective: Vitals:   08/05/21 0200 08/05/21 0516 08/05/21 0723 08/05/21 1312  BP: (!) 128/91 98/64  133/88  Pulse: 77 82  70  Resp:  16  12  Temp:  98 F (36.7 C)  98 F (36.7 C)  TempSrc:  Oral  Oral  SpO2:  95% 95% 100%  Weight:      Height:        Intake/Output Summary (Last 24 hours) at 08/05/2021 1958 Last data filed at 08/05/2021 1300 Gross per 24 hour  Intake 726.7 ml  Output --  Net 726.7 ml   Filed Weights   08/03/21 1323 08/03/21 2113  Weight: 90.7 kg 91.2 kg    Examination:  General exam: Appears calm and comfortable  Respiratory system: Clear to  auscultation. Respiratory effort normal. Cardiovascular system: S1 & S2 heard, RRR. No JVD, murmurs, rubs, gallops or clicks.  Trace pedal edema. Gastrointestinal system: Abdomen is distended, soft and nontender.  Normal bowel sounds heard.  Increased truncal adiposity noted Central nervous system: Alert and oriented. No focal neurological deficits. Extremities: Symmetric 5 x 5  power. Skin: No rashes, lesions or ulcers Psychiatry: Judgement and insight appear normal. Mood & affect appropriate.     Data Reviewed: I have personally reviewed following labs and imaging studies  CBC: Recent Labs  Lab 08/03/21 1358 08/04/21 0542 08/05/21 0546  WBC 10.0 11.9* 6.9  HGB 15.0 14.7 12.5  HCT 43.9 43.2 39.1  MCV 102.1* 100.5* 105.1*  PLT 195 217 143*   Basic Metabolic Panel: Recent Labs  Lab 08/03/21 1358 08/04/21 0542 08/05/21 0546  NA 135 135 136  K 3.5 3.1* 3.6  CL 94* 96* 97*  CO2 32 29 29  GLUCOSE 101* 102* 92  BUN 5* 6 6  CREATININE 0.60 0.68 0.70  CALCIUM 8.4* 8.4* 8.3*  MG  --  1.7 1.8   GFR: Estimated Creatinine Clearance: 81.8 mL/min (by C-G formula based on SCr of 0.7 mg/dL). Liver Function Tests: Recent Labs  Lab 08/03/21 1358 08/04/21 0542 08/05/21 0546  AST 117* 107* 89*  ALT 26 25 19   ALKPHOS 111 107 81  BILITOT 2.3* 1.9* 1.9*  PROT 6.8 6.6 6.2*  ALBUMIN 2.7* 2.7* 3.2*   Recent Labs  Lab 08/03/21 1358  LIPASE 31   No results for input(s): AMMONIA in the last 168 hours. Coagulation Profile: Recent Labs  Lab 08/04/21 0542  INR 1.2   Cardiac Enzymes: No results for input(s): CKTOTAL, CKMB, CKMBINDEX, TROPONINI in the last 168 hours. BNP (last 3 results) No results for input(s): PROBNP in the last 8760 hours. HbA1C: No results for input(s): HGBA1C in the last 72 hours. CBG: No results for input(s): GLUCAP in the last 168 hours. Lipid Profile: No results for input(s): CHOL, HDL, LDLCALC, TRIG, CHOLHDL, LDLDIRECT in the last 72 hours. Thyroid Function Tests: No results for input(s): TSH, T4TOTAL, FREET4, T3FREE, THYROIDAB in the last 72 hours. Anemia Panel: No results for input(s): VITAMINB12, FOLATE, FERRITIN, TIBC, IRON, RETICCTPCT in the last 72 hours. Sepsis Labs: No results for input(s): PROCALCITON, LATICACIDVEN in the last 168 hours.  Recent Results (from the past 240 hour(s))  Resp Panel by RT-PCR (Flu  A&B, Covid) Nasopharyngeal Swab     Status: None   Collection Time: 08/03/21  7:27 PM   Specimen: Nasopharyngeal Swab; Nasopharyngeal(NP) swabs in vial transport medium  Result Value Ref Range Status   SARS Coronavirus 2 by RT PCR NEGATIVE NEGATIVE Final    Comment: (NOTE) SARS-CoV-2 target nucleic acids are NOT DETECTED.  The SARS-CoV-2 RNA is generally detectable in upper respiratory specimens during the acute phase of infection. The lowest concentration of SARS-CoV-2 viral copies this assay can detect is 138 copies/mL. A negative result does not preclude SARS-Cov-2 infection and should not be used as the sole basis for treatment or other patient management decisions. A negative result may occur with  improper specimen collection/handling, submission of specimen other than nasopharyngeal swab, presence of viral mutation(s) within the areas targeted by this assay, and inadequate number of viral copies(<138 copies/mL). A negative result must be combined with clinical observations, patient history, and epidemiological information. The expected result is Negative.  Fact Sheet for Patients:  08/05/21  Fact Sheet for Healthcare Providers:  BloggerCourse.com  This test is no  t yet approved or cleared by the [place] and  has been authorized for detection and/or diagnosis of SARS-CoV-2 by FDA under an Emergency Use Authorization (EUA). This EUA will remain  in effect (meaning this test can be used) for the duration of the COVID-19 declaration under Section 564(b)(1) of the Act, 21 U.S.C.section 360bbb-3(b)(1), unless the authorization is terminated  or revoked sooner.       Influenza A by PCR NEGATIVE NEGATIVE Final   Influenza B by PCR NEGATIVE NEGATIVE Final    Comment: (NOTE) The Xpert Xpress SARS-CoV-2/FLU/RSV plus assay is intended as an aid in the diagnosis of influenza from Nasopharyngeal swab specimens  and should not be used as a sole basis for treatment. Nasal washings and aspirates are unacceptable for Xpert Xpress SARS-CoV-2/FLU/RSV testing.  Fact Sheet for Patients: BloggerCourse.com  Fact Sheet for Healthcare Providers: SeriousBroker.it  This test is not yet approved or cleared by the [place] FDA and has been authorized for detection and/or diagnosis of SARS-CoV-2 by FDA under an Emergency Use Authorization (EUA). This EUA will remain in effect (meaning this test can be used) for the duration of the COVID-19 declaration under Section 564(b)(1) of the Act, 21 U.S.C. section 360bbb-3(b)(1), unless the authorization is terminated or revoked.  Performed at [facility], [address] [place], [place] [address]          Radiology Studies: [place] ASCITES (ABDOMEN LIMITED)  Result Date: 08/04/2021 CLINICAL DATA:  Ascites EXAM: LIMITED ABDOMEN ULTRASOUND FOR ASCITES TECHNIQUE: Limited ultrasound survey for ascites was performed in all four abdominal quadrants. COMPARISON:  CT abdomen 08/03/2021 FINDINGS: Only small pockets of ascites are appreciable in the right and left abdomen, such that therapeutic paracentesis would probably have little effect. Moreover, there superficial loops of bowel in these pockets increasing risk of bowel injury for paracentesis at this time. Accordingly, paracentesis was not performed. IMPRESSION: 1. Only small pockets of ascites are present in the right and left abdomen as noted above. Electronically Signed   By: Gaylyn Rong M.D.   On: 08/04/2021 11:09        Scheduled Meds:  folic acid  1 mg Oral Daily   furosemide  40 mg Intravenous Daily   lactulose  45 g Oral BID   levothyroxine  88 mcg Oral Q0600   [START ON 08/06/2021] linaclotide  290 mcg Oral QAC breakfast   LORazepam  0-4 mg Intravenous Q6H   Followed by   LORazepam  0-4 mg Intravenous Q12H   mometasone-formoterol  2 puff  Inhalation BID   multivitamin with minerals  1 tablet Oral Daily   nicotine  21 mg Transdermal Daily   pantoprazole  40 mg Oral BID AC   pregabalin  75 mg Oral Daily   simethicone  80 mg Oral QID   sodium chloride flush  3 mL Intravenous Q12H   thiamine  100 mg Oral Daily   Or   thiamine  100 mg Intravenous Daily   Continuous Infusions:  sodium chloride     cefTRIAXone (ROCEPHIN)  IV 2 g (08/04/21 2105)     LOS: 1 day    Shon Hale, MD [facility]   If 7PM-7AM, please contact night-coverage [email or web address]  08/05/2021, 7:58 PM

## 2021-08-05 NOTE — Progress Notes (Signed)
Subjective:  No BM. Feels very bloated and distended. Wants relief. Feels like no room to eat. No nausea or vomiting. Very little flatus. Has been having a lot of constipation at home. With her IBS, used to have predominance of diarrhea earlier in the year but now more constipation. In the last 14 days, she has only passed 3 very small pieces of stool about 3 days ago. Has not been on narcotics in over a year.   Objective: Vital signs in last 24 hours: Temp:  [98 F (36.7 C)-99 F (37.2 C)] 98 F (36.7 C) (12/28 0516) Pulse Rate:  [69-82] 82 (12/28 0516) Resp:  [16] 16 (12/28 0516) BP: (98-129)/(64-92) 98/64 (12/28 0516) SpO2:  [95 %-97 %] 95 % (12/28 0723)   General:   Alert,  Well-developed, well-nourished, pleasant and cooperative in NAD Head:  Normocephalic and atraumatic. Eyes:  Sclera clear, no icterus.  Abdomen:  Distended, mostly in upper abdomen. Bowel sounds, soft/hypoactive. Abdomen is tense in upper abdomen. Nontender without guarding, and without rebound.   Extremities:  Without clubbing, deformity or edema. Neurologic:  Alert and  oriented x4;  grossly normal neurologically. Skin:  Intact without significant lesions or rashes. Psych:  Alert and cooperative. Normal mood and affect.  Intake/Output from previous day: 12/27 0701 - 12/28 0700 In: 817.2 [P.O.:480; IV Piggyback:337.2] Out: -  Intake/Output this shift: No intake/output data recorded.  Lab Results: CBC Recent Labs    08/03/21 1358 08/04/21 0542 08/05/21 0546  WBC 10.0 11.9* 6.9  HGB 15.0 14.7 12.5  HCT 43.9 43.2 39.1  MCV 102.1* 100.5* 105.1*  PLT 195 217 143*   BMET Recent Labs    08/03/21 1358 08/04/21 0542 08/05/21 0546  NA 135 135 136  K 3.5 3.1* 3.6  CL 94* 96* 97*  CO2 32 29 29  GLUCOSE 101* 102* 92  BUN 5* 6 6  CREATININE 0.60 0.68 0.70  CALCIUM 8.4* 8.4* 8.3*   LFTs Recent Labs    08/03/21 1358 08/04/21 0542 08/05/21 0546  BILITOT 2.3* 1.9* 1.9*  ALKPHOS 111 107 81  AST  117* 107* 89*  ALT 26 25 19   PROT 6.8 6.6 6.2*  ALBUMIN 2.7* 2.7* 3.2*   Recent Labs    08/03/21 1358  LIPASE 31   PT/INR Recent Labs    08/04/21 0542  LABPROT 15.6*  INR 1.2      Imaging Studies: CT ABDOMEN PELVIS W CONTRAST  Result Date: 08/03/2021 CLINICAL DATA:  Abdominal pain and distension for 1 month, diarrhea, right lower quadrant abdominal pain, history of irritable bowel syndrome EXAM: CT ABDOMEN AND PELVIS WITH CONTRAST TECHNIQUE: Multidetector CT imaging of the abdomen and pelvis was performed using the standard protocol following bolus administration of intravenous contrast. CONTRAST:  08/05/2021 OMNIPAQUE IOHEXOL 300 MG/ML  SOLN COMPARISON:  11/12/2005 FINDINGS: Lower chest: No acute pleural or parenchymal lung disease. Hepatobiliary: Shrunken nodular appearance of the liver consistent with cirrhosis. There is heterogeneous hypoattenuation throughout the liver parenchyma, which could reflect steatosis. No discrete liver mass on this single phase evaluation. If underlying liver neoplasm is suspected or clinically important to document, nonemergent follow-up outpatient dedicated liver MRI could be performed. No intrahepatic biliary duct dilation. The gallbladder surgically absent. Pancreas: Unremarkable. No pancreatic ductal dilatation or surrounding inflammatory changes. Spleen: Normal in size without focal abnormality. Adrenals/Urinary Tract: Kidneys enhance normally and symmetrically. No urinary tract calculi or obstruction. The adrenals are unremarkable. Bladder is decompressed, limiting its evaluation. Stomach/Bowel: No bowel obstruction or ileus. Postsurgical  changes from prior sigmoid colon resection and reanastomosis. Normal retrocecal appendix. No bowel wall thickening or inflammatory change. Vascular/Lymphatic: No significant vascular findings are present. No enlarged abdominal or pelvic lymph nodes. Reproductive: Status post hysterectomy. No adnexal masses. Other: Moderate  ascites most pronounced in the upper abdomen. No free intraperitoneal gas. Postsurgical changes from prior umbilical hernia repair. Musculoskeletal: No acute or destructive bony lesions. Reconstructed images demonstrate no additional findings. IMPRESSION: 1. Cirrhosis, with heterogeneous hypodensities throughout the liver which could reflect steatosis. Evaluation for liver neoplasm is limited on this single phase exam, and if further evaluation is desired a nonemergent outpatient dedicated liver MRI could be performed. 2. Moderate ascites. 3.  Aortic Atherosclerosis (ICD10-I70.0). Electronically Signed   By: Sharlet Salina M.D.   On: 08/03/2021 16:30   [place] ASCITES (ABDOMEN LIMITED)  Result Date: 08/04/2021 CLINICAL DATA:  Ascites EXAM: LIMITED ABDOMEN ULTRASOUND FOR ASCITES TECHNIQUE: Limited ultrasound survey for ascites was performed in all four abdominal quadrants. COMPARISON:  CT abdomen 08/03/2021 FINDINGS: Only small pockets of ascites are appreciable in the right and left abdomen, such that therapeutic paracentesis would probably have little effect. Moreover, there superficial loops of bowel in these pockets increasing risk of bowel injury for paracentesis at this time. Accordingly, paracentesis was not performed. IMPRESSION: 1. Only small pockets of ascites are present in the right and left abdomen as noted above. Electronically Signed   By: Gaylyn Rong M.D.   On: 08/04/2021 11:09  [2 weeks]   Assessment:  60 y/o female with cirrhosis, Hep C (treatment failure many years ago and subsequent tx in 2015 with Viekira and Ribavirin and achieved SVR), alcohol abuse, presenting with abdominal distension. Lost to GI follow up, last seen in 2016. Endorses continued ETOH abuse.   Abdominal distension: no abdominal pain, but uncomfotable. Abdomen more distended and tense in upper abdomen. Limited US showed only small pockets of ascites unable to reach safely. Afebrile but complained of chills prior to  admission. Empiric antibiotics started day of admission for possible SBP.Patient reports last BM, tiny, 3 days ago and before that she had not been in 13 days. No stool this admission and still uncomfortable. Patient reports that she was checked for fecal impaction since admission and was negative.   Cirrhosis: MELD 11. Abnormal LFTs consistent with etoh hepatitis but low DF (13.9 with control of 13). HCV RNA pending (previous documented SVR in 2016).   ETOH use: patient desires assistance to stop. Consult to transition of care team in place to assist with substance abuse counseling.   Constipation: miralax started yesterday, has received 3 doses. She received dose of lactulose (60g) today. Given degree of constipation, she will likely need much more aggressive management. Given she just took large dose of lactulose, will hold off on Linzess today. Will given her enema. She is receiving frequent morphine which is exacerbating her constipation.    Reported black stool in setting of constipation last month: no overt GI bleeding currently. Hgb normal/stable. Monitor.   Liver lesions: ?heterogeneous hypodensities throughout the liver on CT. May reflect steatosis, no discrete mass seen. AFP 6. She will need outpatient follow up with MRI liver with and without contrast.   Plan: 2 gram sodium diet.  Follow up HCV RNA. Outpatient follow up MRI liver with and without contrast to further evaluate liver lesions.  Complete ETOH cessation. Enema today. Consider Linzess daily for management of significant constipation. Hold off today given large dose of lactulose given this morning. Miralax and  lactulose may further contribute to her gas/bloating.   Leanna Battles. Dixon Boos [facility] [phone] 12/28/202211:35 AM    LOS: 1 day

## 2021-08-05 NOTE — Progress Notes (Signed)
Patient wanted to take a shower but was concerned about IV coming out. IV was covered so she could shower. Afterwards patient had uncovered IV herself and it was bleeding around site. States she bumped it on the wall. IV flushed and had blood return. Changed bandage and wrapped ace wrap to help prevent it from catching on anything. Patient showing moderate tremors and appears anxious. Redid CIWA scale and she qualifies for 1 mg of ativan. This will be given and will reevaluate her symptoms within an hour.

## 2021-08-06 ENCOUNTER — Encounter (HOSPITAL_COMMUNITY): Payer: Self-pay | Admitting: Internal Medicine

## 2021-08-06 ENCOUNTER — Telehealth: Payer: Self-pay | Admitting: Gastroenterology

## 2021-08-06 DIAGNOSIS — I10 Essential (primary) hypertension: Secondary | ICD-10-CM | POA: Diagnosis present

## 2021-08-06 DIAGNOSIS — J449 Chronic obstructive pulmonary disease, unspecified: Secondary | ICD-10-CM | POA: Diagnosis present

## 2021-08-06 LAB — COMPREHENSIVE METABOLIC PANEL
ALT: 21 U/L (ref 0–44)
AST: 87 U/L — ABNORMAL HIGH (ref 15–41)
Albumin: 4.4 g/dL (ref 3.5–5.0)
Alkaline Phosphatase: 80 U/L (ref 38–126)
Anion gap: 10 (ref 5–15)
BUN: <5 mg/dL — ABNORMAL LOW (ref 6–20)
CO2: 30 mmol/L (ref 22–32)
Calcium: 9.3 mg/dL (ref 8.9–10.3)
Chloride: 98 mmol/L (ref 98–111)
Creatinine, Ser: 0.58 mg/dL (ref 0.44–1.00)
GFR, Estimated: >60 mL/min (ref >60)
Glucose, Bld: 84 mg/dL (ref 70–99)
Potassium: 3.1 mmol/L — ABNORMAL LOW (ref 3.5–5.1)
Sodium: 138 mmol/L (ref 135–145)
Total Bilirubin: 1.8 mg/dL — ABNORMAL HIGH (ref 0.3–1.2)
Total Protein: 7.5 g/dL (ref 6.5–8.1)

## 2021-08-06 LAB — CBC
HCT: 39.7 % (ref 36.0–46.0)
Hemoglobin: 13.3 g/dL (ref 12.0–15.0)
MCH: 35 pg — ABNORMAL HIGH (ref 26.0–34.0)
MCHC: 33.5 g/dL (ref 30.0–36.0)
MCV: 104.5 fL — ABNORMAL HIGH (ref 80.0–100.0)
Platelets: 134 10*3/uL — ABNORMAL LOW (ref 150–400)
RBC: 3.8 MIL/uL — ABNORMAL LOW (ref 3.87–5.11)
RDW: 14.9 % (ref 11.5–15.5)
WBC: 7 10*3/uL (ref 4.0–10.5)
nRBC: 0 % (ref 0.0–0.2)

## 2021-08-06 MED ORDER — CIPROFLOXACIN HCL 250 MG PO TABS: 500.0000 mg | ORAL_TABLET | Freq: Every day | ORAL | Status: DC

## 2021-08-06 MED ORDER — ALBUTEROL SULFATE HFA 108 (90 BASE) MCG/ACT IN AERS: 2.0000 | INHALATION_SPRAY | Freq: Four times a day (QID) | RESPIRATORY_TRACT | 2 refills | Status: DC | PRN

## 2021-08-06 MED ORDER — SODIUM CHLORIDE 0.9 % IV SOLN
2.0000 g | INTRAVENOUS | Status: AC
  Administered 2021-08-06: 13:00:00 2 g via INTRAVENOUS
  Filled 2021-08-06: qty 20

## 2021-08-06 MED ORDER — NICOTINE 21 MG/24HR TD PT24
21.0000 mg | MEDICATED_PATCH | Freq: Every day | TRANSDERMAL | 0 refills | Status: DC
Start: 2021-08-07 — End: 2021-08-25

## 2021-08-06 MED ORDER — POTASSIUM CHLORIDE CRYS ER 20 MEQ PO TBCR
40.0000 meq | EXTENDED_RELEASE_TABLET | ORAL | Status: AC
  Administered 2021-08-06 (×2): 40 meq via ORAL
  Filled 2021-08-06 (×2): qty 2

## 2021-08-06 MED ORDER — ADULT MULTIVITAMIN W/MINERALS CH: 1.0000 | ORAL_TABLET | Freq: Every day | ORAL | 1 refills | Status: DC

## 2021-08-06 MED ORDER — MOMETASONE FURO-FORMOTEROL FUM 200-5 MCG/ACT IN AERO: 2.0000 | INHALATION_SPRAY | Freq: Two times a day (BID) | RESPIRATORY_TRACT | 1 refills | Status: DC

## 2021-08-06 MED ORDER — ALBUTEROL SULFATE (2.5 MG/3ML) 0.083% IN NEBU
2.5000 mg | INHALATION_SOLUTION | RESPIRATORY_TRACT | 12 refills | Status: DC | PRN
Start: 2021-08-06 — End: 2022-07-23

## 2021-08-06 MED ORDER — PANTOPRAZOLE SODIUM 40 MG PO TBEC: 40.0000 mg | DELAYED_RELEASE_TABLET | Freq: Two times a day (BID) | ORAL | 3 refills | Status: DC

## 2021-08-06 MED ORDER — FUROSEMIDE 40 MG PO TABS: 40.0000 mg | ORAL_TABLET | Freq: Every day | ORAL | 1 refills | Status: DC

## 2021-08-06 MED ORDER — LINACLOTIDE 290 MCG PO CAPS: 290.0000 ug | ORAL_CAPSULE | Freq: Every day | ORAL | 3 refills | Status: DC

## 2021-08-06 MED ORDER — CIPROFLOXACIN HCL 500 MG PO TABS: 500.0000 mg | ORAL_TABLET | Freq: Every day | ORAL | 0 refills | Status: DC

## 2021-08-06 MED ORDER — POTASSIUM CHLORIDE ER 10 MEQ PO TBCR: 10.0000 meq | EXTENDED_RELEASE_TABLET | Freq: Every day | ORAL | 1 refills | Status: DC

## 2021-08-06 MED ORDER — HYDROXYZINE HCL 25 MG PO TABS: 25.0000 mg | ORAL_TABLET | Freq: Three times a day (TID) | ORAL | 1 refills | Status: DC | PRN

## 2021-08-06 NOTE — Discharge Summary (Signed)
Karla Young, is a 60 y.o. female  DOB 1961/05/23  MRN [id number].  Admission date:  08/03/2021  Admitting Physician  Erick Blinks, MD  Discharge Date:  08/06/2021   Primary MD  [facility]  Recommendations for primary care physician for things to follow:  1) complete abstinence from tobacco advised--May use over-the-counter nicotine patch to help you quit smoking 2) complete abstinence from alcohol advised----outpatient or inpatient alcohol rehab programs advised 3) please take ciprofloxacin antibiotic until you see the GI physician again for follow-up adjustment 4)Follow up with Dr. Levon Hedger-- address [address], [address], [place] [address],,KPTWS Number [phone] in 1 to 2 weeks for recheck 5) repeat CBC and CMP as well as PT/INR blood test in 1 to 2 weeks with gastroenterologist   Admission Diagnosis  Generalized abdominal pain [R10.84] Ascites [R18.8] Ascites of liver [R18.8] Abdominal distention [R14.0]   Discharge Diagnosis  Generalized abdominal pain [R10.84] Ascites [R18.8] Ascites of liver [R18.8] Abdominal distention [R14.0]    Principal Problem:   Cirrhosis of liver with ascites (HCC) Active Problems:   Ascites of liver   Alcohol abuse   COPD (chronic obstructive pulmonary disease) (HCC)   HTN (hypertension)   Hypothyroidism   Irritable bowel syndrome (IBS)   Abdominal distention   Constipation      Past Medical History:  Diagnosis Date   Anxiety    COPD (chronic obstructive pulmonary disease) (HCC)    Fibromyalgia    GERD (gastroesophageal reflux disease)    Hepatitis C    Hypertension    Hypothyroid    Migraines    MRSA (methicillin resistant staph aureus) culture positive     Past Surgical History:  Procedure Laterality Date   ABDOMINAL HYSTERECTOMY     partial   BACK SURGERY     lumbar removal of disc   BIOPSY   09/06/2014   Procedure: BIOPSY;  Surgeon: Malissa Hippo, MD;  Location: [facility];  Service: Endoscopy;;   CHOLECYSTECTOMY     COLONOSCOPY WITH PROPOFOL N/A 09/06/2014   Procedure: COLONOSCOPY WITH PROPOFOL (Procedure #2) At cecum at 0819; total withdrawal time=13 minutes;  Surgeon: Malissa Hippo, MD;  Location: [facility];  Service: Endoscopy;  Laterality: N/A;   ESOPHAGEAL DILATION N/A 09/06/2014   Procedure: ESOPHAGEAL DILATION WITH MALONEY DILATOR ;  Surgeon: Malissa Hippo, MD;  Location: [facility];  Service: Endoscopy;  Laterality: N/A;   ESOPHAGOGASTRODUODENOSCOPY (EGD) WITH PROPOFOL N/A 09/06/2014   Procedure: ESOPHAGOGASTRODUODENOSCOPY (EGD) WITH PROPOFOL (Procedure #1) ;  Surgeon: Malissa Hippo, MD;  Location: [facility];  Service: Endoscopy;  Laterality: N/A;   HEMORRHOID SURGERY     Dr Vicenta Aly   PARTIAL HYSTERECTOMY      One ovary left   RECTAL PROLAPSE REPAIR       HPI  from the history and physical done on the day of admission:    Chief Complaint: Abdominal distention and pain   HPI: Karla Young is a 60 y.o. female with medical history significant of hepatitis  C in the past which she reports was treated with antivirals, hypothyroidism, hypertension, reports noting abdominal distention which began approximately a month ago.  Over the past 4 to 5 days, she is noted substantial worsening.  She has developed significant discomfort in her abdomen.  She denies any fevers, but does report having chills in the past few days.  She has not had any vomiting.  She has irritable bowel syndrome and was having issues with diarrhea in the past, but now reports significant constipation.  P.o. intake has been poor.  She had difficulty moving around due to abdominal girth and become short of breath while trying to walk.  She describes diffuse abdominal pain, not specifically localized to 1 area.  She does admit to drinking alcohol, approximately 4 times a week.  She says she has attended meetings in  the past to help her quit, but has been unable to do so.   ED Course: In the emergency room, she is noted to have significant abdominal distention.  CT of the abdomen pelvis does show ascites, cirrhotic liver, heterogenous hypodensities throughout the liver.  Bilirubin mildly elevated at 2.3.  She received pain medications for her abdominal discomfort.  Ultrasound was performed by EDP in the emergency room, but it was not felt that she had an accessible large pocket of fluid for easy drainage.  Request was for admission for paracentesis and further GI evaluation   Review of Systems: As per HPI otherwise 10 point review of systems negative.      Hospital Course:    -Alcoholic liver cirrhosis -Prior history of hep C which patient reports was treated -Acute viral hepatitis panel positive for HCV antibody.  HCV RNA requested - patient continues to drink alcohol -Treated with ceftriaxone for possible SBP, -GI recommends discharge on Cipro -Outpatient follow-up with GI advised -Complete abstinence from alcohol advised   Heterogenous hypodensities of the liver -We will likely need further outpatient evaluation with MRI   Alcohol abuse -Reports history of alcohol withdrawal in the past -TOC provided patient with information/resources regarding alcohol rehabilitation -Patient does not seem eager to undergo rehab at this time   COPD -Stable with steroids and bronchodilators   Hypothyroidism -Continue on Synthroid   Hypertension -On lisinopril and Lasix -   Irritable bowel syndrome -Reports having constipation lately -GI service would like to switch her to Linzess     Code Status: Full code Family Communication: Updated daughter by phone    disposition Plan: Discharge home   Consultants:  Gastroenterology   Antimicrobials:  Ceftriaxone 12/26> completed -GI recommends discharge on Cipro  Discharge Condition: Stable  Follow UP   Follow-up Information     Karla Frame, MD. Schedule an appointment as soon as possible for a visit in 1 week(s).   Specialty: Gastroenterology Contact information: [address] [place] [address] [phone]                Diet and Activity recommendation:  As advised  Discharge Instructions    Discharge Instructions     Call MD for:  difficulty breathing, headache or visual disturbances   Complete by: As directed    Call MD for:  persistant dizziness or light-headedness   Complete by: As directed    Call MD for:  persistant nausea and vomiting   Complete by: As directed    Call MD for:  severe uncontrolled pain   Complete by: As directed    Call MD for:  temperature >100.4   Complete by: As directed    Diet - low sodium heart healthy   Complete by: As directed    Discharge instructions   Complete by: As directed    1) complete abstinence from tobacco advised--May use over-the-counter nicotine patch to help you quit smoking 2) complete abstinence from alcohol advised----outpatient or inpatient alcohol rehab programs advised 3) please take ciprofloxacin antibiotic until you see the GI physician again for follow-up adjustment 4)Follow up with Dr. Levon Hedger-- address [address], [place] [address],,LNZVJ Number [phone] in 1 to 2 weeks for recheck 5) repeat CBC and CMP as well as PT/INR blood test in 1 to 2 weeks with gastroenterologist   For home use only DME Nebulizer machine   Complete by: As directed    Patient needs a nebulizer to treat with the following condition: COPD (chronic obstructive pulmonary disease) (HCC)   Length of Need: Lifetime   Increase activity slowly   Complete by: As directed          Discharge Medications     Allergies as of 08/06/2021   No Known Allergies      Medication List     STOP taking these medications    dicyclomine 10 MG capsule Commonly known as: BENTYL   diphenhydrAMINE 25 MG tablet Commonly  known as: BENADRYL   nystatin-triamcinolone cream Commonly known as: MYCOLOG II   omeprazole 40 MG capsule Commonly known as: PRILOSEC   oxyCODONE 5 MG immediate release tablet Commonly known as: Oxy IR/ROXICODONE       TAKE these medications    albuterol 108 (90 Base) MCG/ACT inhaler Commonly known as: VENTOLIN HFA Inhale 2 puffs into the lungs every 6 (six) hours as needed for wheezing or shortness of breath. What changed:  medication strength how much to take when to take this reasons to take this   albuterol (2.5 MG/3ML) 0.083% nebulizer solution Commonly known as: PROVENTIL Take 3 mLs (2.5 mg total) by nebulization every 2 (two) hours as needed for wheezing. What changed: You were already taking a medication with the same name, and this prescription was added. Make sure you understand how and when to take each.   ciprofloxacin 500 MG tablet Commonly known as: CIPRO Take 1 tablet (500 mg total) by mouth daily with breakfast. Start taking on: August 07, 2021   furosemide 40 MG tablet Commonly known as: Lasix Take 1 tablet (40 mg total) by mouth daily.   hydrOXYzine 25 MG tablet Commonly known as: ATARAX Take 1 tablet (25 mg total) by mouth 3 (three) times daily as needed for anxiety or nausea (restlessness).   levothyroxine 88 MCG tablet Commonly known as: SYNTHROID Take 88 mcg by mouth daily before breakfast.   linaclotide 290 MCG Caps capsule Commonly known as: LINZESS Take 1 capsule (290 mcg total) by mouth daily before breakfast. Start taking on: August 07, 2021   lisinopril 10 MG tablet Commonly known as: ZESTRIL Take 10 mg by mouth daily.   Lyrica 75 MG capsule Generic drug: pregabalin Take 1 capsule by mouth daily.   mometasone-formoterol 200-5 MCG/ACT Aero Commonly known as: DULERA Inhale 2 puffs into the lungs 2 (two) times daily.   multivitamin with minerals Tabs tablet Take 1 tablet by mouth daily. Start taking on: August 07, 2021   nicotine 21 mg/24hr patch Commonly known as: NICODERM CQ - dosed in mg/24 hours Place 1 patch (21 mg total) onto the skin daily. Start taking on: August 07, 2021   pantoprazole 40 MG tablet Commonly known as: PROTONIX Take 1 tablet (40 mg total) by mouth 2 (two) times daily.   potassium chloride 10 MEQ tablet Commonly known as: KLOR-CON Take 1 tablet (10 mEq total) by mouth daily. Take While taking Lasix/furosemide               Durable Medical Equipment  (From admission, onward)           Start     Ordered   08/06/21 0000  For home use only DME Nebulizer machine       Question Answer Comment  Patient needs a nebulizer to treat with the following condition COPD (chronic obstructive pulmonary disease) (HCC)   Length of Need Lifetime      08/06/21 1444            Major procedures and Radiology Reports - PLEASE review detailed and final reports for all details, in brief -    CT ABDOMEN PELVIS W CONTRAST  Result Date: 08/03/2021 CLINICAL DATA:  Abdominal pain and distension for 1 month, diarrhea, right lower quadrant abdominal pain, history of irritable bowel syndrome EXAM: CT ABDOMEN AND PELVIS WITH CONTRAST TECHNIQUE: Multidetector CT imaging of the abdomen and pelvis was performed using the standard protocol following bolus administration of intravenous contrast. CONTRAST:  OMNIPAQUE IOHEXOL 300 MG/ML  SOLN COMPARISON:  11/12/2005 FINDINGS: Lower chest: No acute pleural or parenchymal lung disease. Hepatobiliary: Shrunken nodular appearance of the liver consistent with cirrhosis. There is heterogeneous hypoattenuation throughout the liver parenchyma, which could reflect steatosis. No discrete liver mass on this single phase evaluation. If underlying liver neoplasm is suspected or clinically important to document, nonemergent follow-up outpatient dedicated liver MRI could be performed. No intrahepatic biliary duct dilation. The gallbladder surgically  absent. Pancreas: Unremarkable. No pancreatic ductal dilatation or surrounding inflammatory changes. Spleen: Normal in size without focal abnormality. Adrenals/Urinary Tract: Kidneys enhance normally and symmetrically. No urinary tract calculi or obstruction. The adrenals are unremarkable. Bladder is decompressed, limiting its evaluation. Stomach/Bowel: No bowel obstruction or ileus. Postsurgical changes from prior sigmoid colon resection and reanastomosis. Normal retrocecal appendix. No bowel wall thickening or inflammatory change. Vascular/Lymphatic: No significant vascular findings are present. No enlarged abdominal or pelvic lymph nodes. Reproductive: Status post hysterectomy. No adnexal masses. Other: Moderate ascites most pronounced in the upper abdomen. No free intraperitoneal gas. Postsurgical changes from prior umbilical hernia repair. Musculoskeletal: No acute or destructive bony lesions. Reconstructed images demonstrate no additional findings. IMPRESSION: 1. Cirrhosis, with heterogeneous hypodensities throughout the liver which could reflect steatosis. Evaluation for liver neoplasm is limited on this single phase exam, and if further evaluation is desired a nonemergent outpatient dedicated liver MRI could be performed. 2. Moderate ascites. 3.  Aortic Atherosclerosis (ICD10-I70.0). Electronically Signed   By: Sharlet Salina M.D.   On: 08/03/2021 16:30   [place] ASCITES (ABDOMEN LIMITED)  Result Date: 08/04/2021 CLINICAL DATA:  Ascites EXAM: LIMITED ABDOMEN ULTRASOUND FOR ASCITES TECHNIQUE: Limited ultrasound survey for ascites was performed in all four abdominal quadrants. COMPARISON:  CT abdomen 08/03/2021 FINDINGS: Only small pockets of ascites are appreciable in the right and left abdomen, such that therapeutic paracentesis would probably have little effect. Moreover, there superficial loops of bowel in these pockets increasing risk of bowel injury for paracentesis at this time. Accordingly,  paracentesis was not performed. IMPRESSION: 1. Only small pockets of ascites are present in the right and left abdomen as noted above. Electronically Signed   By: Zollie Beckers  Ova Freshwater M.D.   On: 08/04/2021 11:09    Micro Results   Recent Results (from the past 240 hour(s))  Resp Panel by RT-PCR (Flu A&B, Covid) Nasopharyngeal Swab     Status: None   Collection Time: 08/03/21  7:27 PM   Specimen: Nasopharyngeal Swab; Nasopharyngeal(NP) swabs in vial transport medium  Result Value Ref Range Status   SARS Coronavirus 2 by RT PCR NEGATIVE NEGATIVE Final    Comment: (NOTE) SARS-CoV-2 target nucleic acids are NOT DETECTED.  The SARS-CoV-2 RNA is generally detectable in upper respiratory specimens during the acute phase of infection. The lowest concentration of SARS-CoV-2 viral copies this assay can detect is 138 copies/mL. A negative result does not preclude SARS-Cov-2 infection and should not be used as the sole basis for treatment or other patient management decisions. A negative result may occur with  improper specimen collection/handling, submission of specimen other than nasopharyngeal swab, presence of viral mutation(s) within the areas targeted by this assay, and inadequate number of viral copies(<138 copies/mL). A negative result must be combined with clinical observations, patient history, and epidemiological information. The expected result is Negative.  Fact Sheet for Patients:  BloggerCourse.com  Fact Sheet for Healthcare Providers:  SeriousBroker.it  This test is no t yet approved or cleared by the [place] FDA and  has been authorized for detection and/or diagnosis of SARS-CoV-2 by FDA under an Emergency Use Authorization (EUA). This EUA will remain  in effect (meaning this test can be used) for the duration of the COVID-19 declaration under Section 564(b)(1) of the Act, 21 U.S.C.section 360bbb-3(b)(1), unless the  authorization is terminated  or revoked sooner.       Influenza A by PCR NEGATIVE NEGATIVE Final   Influenza B by PCR NEGATIVE NEGATIVE Final    Comment: (NOTE) The Xpert Xpress SARS-CoV-2/FLU/RSV plus assay is intended as an aid in the diagnosis of influenza from Nasopharyngeal swab specimens and should not be used as a sole basis for treatment. Nasal washings and aspirates are unacceptable for Xpert Xpress SARS-CoV-2/FLU/RSV testing.  Fact Sheet for Patients: BloggerCourse.com  Fact Sheet for Healthcare Providers: SeriousBroker.it  This test is not yet approved or cleared by the [place] FDA and has been authorized for detection and/or diagnosis of SARS-CoV-2 by FDA under an Emergency Use Authorization (EUA). This EUA will remain in effect (meaning this test can be used) for the duration of the COVID-19 declaration under Section 564(b)(1) of the Act, 21 U.S.C. section 360bbb-3(b)(1), unless the authorization is terminated or revoked.  Performed at [facility], [address] [place], [place] [address]    Today   Subjective    Karla Young today has no new additional complaints No fever  Or chills   No Nausea, Vomiting or Diarrhea       Patient has been seen and examined prior to discharge   Objective   Blood pressure 119/79, pulse 75, temperature 98.2 F (36.8 C), temperature source Oral, resp. rate 14, height  (1.626 m), weight 91.2 kg, SpO2 99 %.  No intake or output data in the 24 hours ending 08/06/21 1445  Exam Gen:- Awake Alert, no acute distress  HEENT:- [place].AT, No sclera icterus Neck-Supple Neck,No JVD,.  Lungs-  CTAB , good air movement bilaterally  CV- S1, S2 normal, regular Abd-  +ve B.Sounds, Abd Soft, No tenderness,    Extremity/Skin:- No  edema,   good pulses Psych-affect is appropriate, oriented x3 Neuro-no new focal deficits, no tremors    Data  Review   CBC w Diff:  Lab Results   Component Value Date   WBC 7.0 08/06/2021   HGB 13.3 08/06/2021   HCT 39.7 08/06/2021   PLT 134 (L) 08/06/2021   LYMPHOPCT 25 02/18/2015   MONOPCT 10 02/18/2015   EOSPCT 3 02/18/2015   BASOPCT 1 02/18/2015    CMP:  Lab Results  Component Value Date   NA 138 08/06/2021   K 3.1 (L) 08/06/2021   CL 98 08/06/2021   CO2 30 08/06/2021   BUN <5 (L) 08/06/2021   CREATININE 0.58 08/06/2021   CREATININE 0.81 02/12/2014   PROT 7.5 08/06/2021   ALBUMIN 4.4 08/06/2021   BILITOT 1.8 (H) 08/06/2021   ALKPHOS 80 08/06/2021   AST 87 (H) 08/06/2021   ALT 21 08/06/2021  .   Total Discharge time is about 33 minutes  Shon Hale M.D on 08/06/2021 at 2:45 PM  Go to [email or web address] -  for contact info  [facility] - Office  [phone]

## 2021-08-06 NOTE — Discharge Instructions (Signed)
1) complete abstinence from tobacco advised--May use over-the-counter nicotine patch to help you quit smoking 2) complete abstinence from alcohol advised----outpatient or inpatient alcohol rehab programs advised 3) please take ciprofloxacin antibiotic until you see the GI physician again for follow-up adjustment 4)Follow up with Dr. Levon Hedger-- address [address], [place] [address],,SEGBT Number [phone] in 1 to 2 weeks for recheck 5) repeat CBC and CMP as well as PT/INR blood test in 1 to 2 weeks with gastroenterologist

## 2021-08-06 NOTE — Progress Notes (Signed)
Subjective:  Feels better after having multiple BMs. Eating solid food. No N/V. No melena, brbpr. Abdomen less distended. Interested in inpatient rehab if insurance will cover.   Objective: Vital signs in last 24 hours: Temp:  [98 F (36.7 C)-98.5 F (36.9 C)] 98.2 F (36.8 C) (12/29 0542) Pulse Rate:  [70-84] 83 (12/29 0542) Resp:  [12-18] 12 (12/29 0542) BP: (133-181)/(81-99) 181/99 (12/29 0542) SpO2:  [96 %-100 %] 97 % (12/29 0542) Last BM Date: 08/05/21 General:   Alert,  Well-developed, well-nourished, pleasant and cooperative in NAD Head:  Normocephalic and atraumatic. Eyes:  Sclera clear, no icterus.  Abdomen:  Soft, distended but much improved, soft. nontender. Normal bowel sounds, without guarding, and without rebound.   Extremities:  Without clubbing, deformity or edema. Neurologic:  Alert and  oriented x4;  grossly normal neurologically. Skin:  Intact without significant lesions or rashes. Psych:  Alert and cooperative. Normal mood and affect.  Intake/Output from previous day: 12/28 0701 - 12/29 0700 In: 480 [P.O.:480] Out: -  Intake/Output this shift: No intake/output data recorded.  Lab Results: CBC Recent Labs    08/04/21 0542 08/05/21 0546 08/06/21 0553  WBC 11.9* 6.9 7.0  HGB 14.7 12.5 13.3  HCT 43.2 39.1 39.7  MCV 100.5* 105.1* 104.5*  PLT 217 143* 134*   BMET Recent Labs    08/04/21 0542 08/05/21 0546 08/06/21 0553  NA 135 136 138  K 3.1* 3.6 3.1*  CL 96* 97* 98  CO2 29 29 30   GLUCOSE 102* 92 84  BUN 6 6 <5*  CREATININE 0.68 0.70 0.58  CALCIUM 8.4* 8.3* 9.3   LFTs Recent Labs    08/04/21 0542 08/05/21 0546 08/06/21 0553  BILITOT 1.9* 1.9* 1.8*  ALKPHOS 107 81 80  AST 107* 89* 87*  ALT 25 19 21   PROT 6.6 6.2* 7.5  ALBUMIN 2.7* 3.2* 4.4   Recent Labs    08/03/21 1358  LIPASE 31   PT/INR Recent Labs    08/04/21 0542  LABPROT 15.6*  INR 1.2      Imaging Studies: CT ABDOMEN PELVIS W CONTRAST  Result Date:  08/03/2021 CLINICAL DATA:  Abdominal pain and distension for 1 month, diarrhea, right lower quadrant abdominal pain, history of irritable bowel syndrome EXAM: CT ABDOMEN AND PELVIS WITH CONTRAST TECHNIQUE: Multidetector CT imaging of the abdomen and pelvis was performed using the standard protocol following bolus administration of intravenous contrast. CONTRAST:  08/06/21 OMNIPAQUE IOHEXOL 300 MG/ML  SOLN COMPARISON:  11/12/2005 FINDINGS: Lower chest: No acute pleural or parenchymal lung disease. Hepatobiliary: Shrunken nodular appearance of the liver consistent with cirrhosis. There is heterogeneous hypoattenuation throughout the liver parenchyma, which could reflect steatosis. No discrete liver mass on this single phase evaluation. If underlying liver neoplasm is suspected or clinically important to document, nonemergent follow-up outpatient dedicated liver MRI could be performed. No intrahepatic biliary duct dilation. The gallbladder surgically absent. Pancreas: Unremarkable. No pancreatic ductal dilatation or surrounding inflammatory changes. Spleen: Normal in size without focal abnormality. Adrenals/Urinary Tract: Kidneys enhance normally and symmetrically. No urinary tract calculi or obstruction. The adrenals are unremarkable. Bladder is decompressed, limiting its evaluation. Stomach/Bowel: No bowel obstruction or ileus. Postsurgical changes from prior sigmoid colon resection and reanastomosis. Normal retrocecal appendix. No bowel wall thickening or inflammatory change. Vascular/Lymphatic: No significant vascular findings are present. No enlarged abdominal or pelvic lymph nodes. Reproductive: Status post hysterectomy. No adnexal masses. Other: Moderate ascites most pronounced in the upper abdomen. No free intraperitoneal gas. Postsurgical changes from prior  umbilical hernia repair. Musculoskeletal: No acute or destructive bony lesions. Reconstructed images demonstrate no additional findings. IMPRESSION: 1.  Cirrhosis, with heterogeneous hypodensities throughout the liver which could reflect steatosis. Evaluation for liver neoplasm is limited on this single phase exam, and if further evaluation is desired a nonemergent outpatient dedicated liver MRI could be performed. 2. Moderate ascites. 3.  Aortic Atherosclerosis (ICD10-I70.0). Electronically Signed   By: Sharlet Salina M.D.   On: 08/03/2021 16:30   [place] ASCITES (ABDOMEN LIMITED)  Result Date: 08/04/2021 CLINICAL DATA:  Ascites EXAM: LIMITED ABDOMEN ULTRASOUND FOR ASCITES TECHNIQUE: Limited ultrasound survey for ascites was performed in all four abdominal quadrants. COMPARISON:  CT abdomen 08/03/2021 FINDINGS: Only small pockets of ascites are appreciable in the right and left abdomen, such that therapeutic paracentesis would probably have little effect. Moreover, there superficial loops of bowel in these pockets increasing risk of bowel injury for paracentesis at this time. Accordingly, paracentesis was not performed. IMPRESSION: 1. Only small pockets of ascites are present in the right and left abdomen as noted above. Electronically Signed   By: Gaylyn Rong M.D.   On: 08/04/2021 11:09  [2 weeks]   Assessment:  60 y/o female with cirrhosis, Hep C (treatment failure many years ago and subsequent tx in 2015 with Viekira and Ribavirin and achieved SVR), alcohol abuse, presenting with abdominal distension. Lost to GI follow up, last seen in 2016. Endorses continued ETOH abuse.    Abdominal distention:  limited US showed only small pockets of ascites unable to reach safely. Afebrile but complains of chills prior to admission, empiric antibiotics started day of admission for possible SBP. Ultimately suspected to have significant distention due to constipation/obstipation. Discussed with her primary GI, Dr. Karilyn Cota. Since she was treated empirically for SBP and no fluid obtained, he advises to complete five days of abx and transition to SBP prophylaxis  indefinitely.   Cirrhosis: MELD 11 this admission. Abnormal LFTs c/w etoh hepatitis but low DF (13.9 with control of 13). HCV RNA negative, with prior history of being treated twice (had 1 genotype first time and second time presented with 2 different genotypes).   ETOH use: patient desires assistance to stop and is interested in etoh rehab, she desires inpatient or outpatient whichever insurance will help with. Consult to transition of care team in place to assist.   Constipation: bowels finally moved yesterday after Miralax BID, lactulose 60g, fleet enema. She has been having significant constipation for weeks. No prior history of hepatic encephalopathy. Would consider alternative for constipation as lactulose will likely exacerbate her bloating/gas. Starting linzess today as inpatient.   Reported black stool in setting of constipation last month: no overt GI bleeding inpatient. Hgb normal/stable.   Liver lesions: ?heterogeneous hypodensities throughout the liver CT.  May reflect steatosis, no discrete mass seen.  Her AFP was 6.  She will need follow-up, consideration of outpatient MRI liver with and without contrast.   Plan: Continue 2 g sodium diet. Recommend complete alcohol cessation.  Patient is interested in alcohol rehabilitation. Continue Linzess 290 mcg daily.  Hold on lactulose and MiraLAX. Complete simethicone. Will require close interval follow-up as an outpatient.  We will make arrangements.  Consider MRI liver with and without contrast. Complete SBP treatment, get Rocephin today and then transition to Cipro 500mg  daily indefinitely for prophylaxis.  GI will sign off. Call with any questions or concerns.   . Leanna Battles [facility] [phone] 12/29/202210:18 AM    LOS: 2 days

## 2021-08-06 NOTE — TOC Transition Note (Addendum)
Transition of Care [facility]) - CM/SW Discharge Note   Patient Details  Name: Karla Young MRN: [id number] Date of Birth: 10/19/1960  Transition [facility]) CM/SW Contact:  Elliot Gault, LCSW Phone Number: 08/06/2021, 11:08 AM   Clinical Narrative:     Received TOC consult for SA treatment resources. Also received message from pt's RN today stating pt wanted to speak with TOC about rehab options. Spoke with pt to answer her questions and provide verbal treatment resource information. Pt stated she will start making calls as she would like to admit to an inpatient rehab treatment facility. A written list of additional resources will be brought up to pt's room as well.  Pt is hopeful for dc today and she states that she does not have any other TOC needs at this time.  1453: MD ordered neb machine for dc. Arranged with [employer]. Machine will be delivered to pt's home. Updated RN.  Expected Discharge Plan: Home/Self Care Barriers to Discharge: Barriers Resolved   Patient Goals and CMS Choice Patient states their goals for this hospitalization and ongoing recovery are:: get to rehab CMS Medicare.gov Compare Post Acute Care list provided to:: Patient Choice offered to / list presented to : Patient  Expected Discharge Plan and Services Expected Discharge Plan: Home/Self Care In-house Referral: Clinical Social Work     Living arrangements for the past 2 months: Single Family Home Expected Discharge Date: 08/05/21                                    Prior Living Arrangements/Services Living arrangements for the past 2 months: Single Family Home Lives with:: Self Patient language and need for interpreter reviewed:: Yes Do you feel safe going back to the place where you live?: Yes      Need for Family Participation in Patient Care: No (Comment) Care giver support system in place?: No (comment)   Criminal Activity/Legal Involvement Pertinent to Current Situation/Hospitalization:  No - Comment as needed  Activities of Daily Living Home Assistive Devices/Equipment: None ADL Screening (condition at time of admission) Patient's cognitive ability adequate to safely complete daily activities?: Yes Is the patient deaf or have difficulty hearing?: No Does the patient have difficulty seeing, even when wearing glasses/contacts?: No Does the patient have difficulty concentrating, remembering, or making decisions?: Yes Patient able to express need for assistance with ADLs?: No Does the patient have difficulty dressing or bathing?: No Independently performs ADLs?: Yes (appropriate for developmental age) Does the patient have difficulty walking or climbing stairs?: No Weakness of Legs: None Weakness of Arms/Hands: None  Permission Sought/Granted                  Emotional Assessment   Attitude/Demeanor/Rapport: Engaged Affect (typically observed): Pleasant Orientation: : Oriented to Self, Oriented to Place, Oriented to  Time, Oriented to Situation Alcohol / Substance Use: Alcohol Use Psych Involvement: No (comment)  Admission diagnosis:  Generalized abdominal pain [R10.84] Ascites [R18.8] Ascites of liver [R18.8] Abdominal distention [R14.0] Patient Active Problem List   Diagnosis Date Noted   Constipation    Abdominal distention 08/04/2021   Ascites of liver 08/03/2021   Cirrhosis of liver with ascites (HCC) 08/03/2021   Alcohol abuse 08/03/2021   Irritable bowel syndrome (IBS) 08/03/2021   Hepatitis C 02/12/2014   Hypothyroidism 02/12/2014   PCP:  [facility] [employer] Pharmacy:   [facility] [phone] - [place], [place] - [address] AT [facility] [address] [place] [address] Phone: [phone] Fax: [phone]     Social Determinants of Health (SDOH) Interventions    Readmission Risk Interventions Readmission Risk Prevention Plan 08/06/2021  Medication Screening Complete   Transportation Screening Complete  Some recent data might be hidden     Final next level of care: Home/Self Care Barriers to Discharge: Barriers Resolved   Patient Goals and CMS Choice Patient states their goals for this hospitalization and ongoing recovery are:: get to rehab CMS Medicare.gov Compare Post Acute Care list provided to:: Patient Choice offered to / list presented to : Patient  Discharge Placement                       Discharge Plan and Services In-house Referral: Clinical Social Work                                   Social Determinants of Health (SDOH) Interventions     Readmission Risk Interventions Readmission Risk Prevention Plan 08/06/2021  Medication Screening Complete  Transportation Screening Complete  Some recent data might be hidden

## 2021-08-06 NOTE — Telephone Encounter (Signed)
Please arrange for hospital follow with Dr. Karilyn Cota in 2-3 weeks for cirrhosis, constipation, liver lesions.

## 2021-08-07 DIAGNOSIS — R188 Other ascites: Secondary | ICD-10-CM

## 2021-08-12 ENCOUNTER — Telehealth (INDEPENDENT_AMBULATORY_CARE_PROVIDER_SITE_OTHER): Payer: Self-pay | Admitting: *Deleted

## 2021-08-12 NOTE — Telephone Encounter (Signed)
Pt states she saw Dr. Karilyn Cota in hospital and asking if he will send in alternative to linzess since insurance will not cover.

## 2021-08-12 NOTE — Telephone Encounter (Signed)
Pt left vm that linzess prescribed in hospital is not covered.  Last OV with Terri 2016.   [phone] I called patient to get more information and no answer. Vm not set up. Note from leslie lewis on 08/06/21 to arrange hospital follow up with dr Karilyn Cota in 2-3 weeks for cirrhosis, constipation and liver lesions.  No appt has been scheduled.

## 2021-08-12 NOTE — Telephone Encounter (Signed)
[facility] [place]

## 2021-08-13 NOTE — Telephone Encounter (Signed)
Karla Young, please schedule appt per dr Karilyn Cota within 2 weeks.

## 2021-08-13 NOTE — Telephone Encounter (Signed)
Per dr Karilyn Cota - may give 2 week supply of samples of linzess 145 mcg. Needs ov within 2 weeks. Tried to call no answer to notify patient.

## 2021-08-14 NOTE — Telephone Encounter (Signed)
Pt notified samples of linzess at front for pickup and she will get a call back about appt.

## 2021-08-25 ENCOUNTER — Encounter (INDEPENDENT_AMBULATORY_CARE_PROVIDER_SITE_OTHER): Payer: Self-pay | Admitting: Internal Medicine

## 2021-08-25 ENCOUNTER — Ambulatory Visit (INDEPENDENT_AMBULATORY_CARE_PROVIDER_SITE_OTHER): Payer: Self-pay | Admitting: Internal Medicine

## 2021-08-25 ENCOUNTER — Other Ambulatory Visit: Payer: Self-pay

## 2021-08-25 VITALS — BP 120/77 | HR 93 | Temp 98.3°F | Ht 64.0 in | Wt 178.8 lb

## 2021-08-25 DIAGNOSIS — K219 Gastro-esophageal reflux disease without esophagitis: Secondary | ICD-10-CM

## 2021-08-25 DIAGNOSIS — R188 Other ascites: Secondary | ICD-10-CM

## 2021-08-25 DIAGNOSIS — K7460 Unspecified cirrhosis of liver: Secondary | ICD-10-CM

## 2021-08-25 MED ORDER — FUROSEMIDE 40 MG PO TABS: 20.0000 mg | ORAL_TABLET | Freq: Every day | ORAL | 1 refills | Status: DC

## 2021-08-25 MED ORDER — ESOMEPRAZOLE MAGNESIUM 40 MG PO CPDR: 40.0000 mg | DELAYED_RELEASE_CAPSULE | Freq: Every day | ORAL | 5 refills | Status: DC

## 2021-08-25 MED ORDER — FAMOTIDINE 20 MG PO TABS: 20.0000 mg | ORAL_TABLET | Freq: Every day | ORAL | Status: DC

## 2021-08-25 NOTE — H&P (View-Only) (Signed)
Presenting complaint;  Follow for chronic liver disease and constipation. Patient complains of heartburn despite taking medication.  Database and subjective:  Patient is 61 year old Caucasian female with chronic liver disease who is here for scheduled visit following recent hospitalization for abdominal pain constipation.  Abdominal pelvic CT revealed cirrhotic liver with moderate ascites.  She received IV diuretic therapy.  Ultrasound-guided paracenteses revealed small pockets of ascites and tap was not recommended.  Patient was also felt to be constipated and treated.  She has a history of chronic liver disease.  She has been treated for chronic hepatitis on 2 different occasions.  First treatment session was in 2002.  She responded therapy.  She was treated f for different genotype in 2015 with SVR.  She was last seen in the office in 2016 elastography suggested F4 disease.  She did not return for follow-up visit. She has been drinking alcohol in excessive amounts.  However she has not had any alcohol since her recent hospitalization last month. She feels much better.  She has lost 22 pounds since recent hospitalization when she weighed 200 pounds.  Today she weighs 178.8 pounds. She complains of daily heartburn despite taking pantoprazole.  She is watching her diet.  She says her abdomen is not distended anymore.  She is not having any abdominal pain.  Her appetite is good.  She is having 3-4 soft to formed stools daily.  She says she has 7 stools when she took first dose of Linzess but not anymore.  She says her stools are at times very dark.  She has not had any frank rectal bleeding. She smokes 5 to 6 cigarettes/day.  She says she had quit cigarette smoking but then started back again 4 months ago.  She says this is mechanism of stress relief for her. She reports developing airway obstruction when she had dental work-up possibly reaction due to what of her medications and she required  tracheostomy.   Current Medications: Outpatient Encounter Medications as of 08/25/2021  Medication Sig   albuterol (PROVENTIL) (2.5 MG/3ML) 0.083% nebulizer solution Take 3 mLs (2.5 mg total) by nebulization every 2 (two) hours as needed for wheezing.   albuterol (VENTOLIN HFA) 108 (90 Base) MCG/ACT inhaler Inhale 2 puffs into the lungs every 6 (six) hours as needed for wheezing or shortness of breath.   ciprofloxacin (CIPRO) 500 MG tablet Take 1 tablet (500 mg total) by mouth daily with breakfast.   furosemide (LASIX) 40 MG tablet Take 1 tablet (40 mg total) by mouth daily.   hydrOXYzine (ATARAX) 25 MG tablet Take 1 tablet (25 mg total) by mouth 3 (three) times daily as needed for anxiety or nausea (restlessness).   levothyroxine (SYNTHROID) 88 MCG tablet Take 88 mcg by mouth daily before breakfast.   linaclotide (LINZESS) 145 MCG CAPS capsule Take 145 mcg by mouth daily before breakfast.   lisinopril (PRINIVIL,ZESTRIL) 10 MG tablet Take 10 mg by mouth daily.   LYRICA 75 MG capsule Take 1 capsule by mouth daily.   mometasone-formoterol (DULERA) 200-5 MCG/ACT AERO Inhale 2 puffs into the lungs 2 (two) times daily.   Multiple Vitamin (MULTIVITAMIN WITH MINERALS) TABS tablet Take 1 tablet by mouth daily.   pantoprazole (PROTONIX) 40 MG tablet Take 1 tablet (40 mg total) by mouth 2 (two) times daily.   potassium chloride (KLOR-CON) 10 MEQ tablet Take 1 tablet (10 mEq total) by mouth daily. Take While taking Lasix/furosemide   [DISCONTINUED] linaclotide (LINZESS) 290 MCG CAPS capsule Take 1 capsule (290  mcg total) by mouth daily before breakfast.   [DISCONTINUED] nicotine (NICODERM CQ - DOSED IN MG/24 HOURS) 21 mg/24hr patch Place 1 patch (21 mg total) onto the skin daily.   No facility-administered encounter medications on file as of 08/25/2021.     Objective: Blood pressure 120/77, pulse 93, temperature 98.3 F (36.8 C), temperature source Oral, height _0  (1.626 m), weight 178 lb 12.8 oz  (81.1 kg). Patient is alert and in no acute distress. She does not have asterixis. Conjunctiva is pink. Sclera is nonicteric Oropharyngeal mucosa is normal. No neck masses or thyromegaly noted. Cardiac exam with regular rhythm normal S1 and S2. No murmur or gallop noted. Lungs are clear to auscultation. Abdomen is full.  Lower midline scar.  On palpation abdomen is soft and nontender with organomegaly or masses.  Shifting dullness is absent. No LE edema or clubbing noted.  Labs/studies Results:   CBC Latest Ref Rng & Units 08/06/2021 08/05/2021 08/04/2021  WBC 4.0 - 10.5 K/uL 7.0 6.9 11.9(H)  Hemoglobin 12.0 - 15.0 g/dL 13.3 12.5 14.7  Hematocrit 36.0 - 46.0 % 39.7 39.1 43.2  Platelets 150 - 400 K/uL 134(L) 143(L) 217    CMP Latest Ref Rng & Units 08/06/2021 08/05/2021 08/04/2021  Glucose 70 - 99 mg/dL 84 92 102(H)  BUN 6 - 20 mg/dL <5(L) 6 6  Creatinine 0.44 - 1.00 mg/dL 0.58 0.70 0.68  Sodium 135 - 145 mmol/L 138 136 135  Potassium 3.5 - 5.1 mmol/L 3.1(L) 3.6 3.1(L)  Chloride 98 - 111 mmol/L 98 97(L) 96(L)  CO2 22 - 32 mmol/L _1 Calcium 8.9 - 10.3 mg/dL 9.3 8.3(L) 8.4(L)  Total Protein 6.5 - 8.1 g/dL 7.5 6.2(L) 6.6  Total Bilirubin 0.3 - 1.2 mg/dL 1.8(H) 1.9(H) 1.9(H)  Alkaline Phos 38 - 126 U/L 80 81 107  AST 15 - 41 U/L 87(H) 89(H) 107(H)  ALT 0 - 44 U/L _2 Hepatic Function Latest Ref Rng & Units 08/06/2021 08/05/2021 08/04/2021  Total Protein 6.5 - 8.1 g/dL 7.5 6.2(L) 6.6  Albumin 3.5 - 5.0 g/dL 4.4 3.2(L) 2.7(L)  AST 15 - 41 U/L 87(H) 89(H) 107(H)  ALT 0 - 44 U/L _3 Alk Phosphatase 38 - 126 U/L 80 81 107  Total Bilirubin 0.3 - 1.2 mg/dL 1.8(H) 1.9(H) 1.9(H)  Bilirubin, Direct 0.0 - 0.3 mg/dL - - -     Assessment:  #1.  Cirrhosis.  Patient has a history of hepatitis C for which she has been treated twice with SVR.  HCVRNA by PCR during recent hospitalization was undetectable.  Another risk factor for liver disease is alcohol abuse.  She now  has developed decompensated liver disease with ascites.  She seem to be responding to diuretic therapy. She will need to be screened for esophageal varices.  #2.  Chronic GERD.  EGD in January 2016 was negative for Barrett's.  She does not have alarm symptoms we will change her PPI and see how she does.  #3.  Chronic constipation.  She is doing well with Linzess.  #4.  Patient is average risk for CRC.  No polyps were found on colonoscopy of January 2016.   Plan:  Discontinue pantoprazole. Begin esomeprazole 40 mg p.o. every morning. Antireflux measures. Patient will go to the lab for CBC, comprehensive chemistry panel and INR. Patient will check with PCP about hepatitis A and B vaccination status.  If she has not been vaccinated she will need these  vaccines. Patient advised to check her weight at least once or twice a week and call if there is change by more than 5 pounds in which case diuretic therapy may have to be adjusted. We will schedule patient for esophagogastroduodenoscopy to screen for esophageal varices under monitored anesthesia care in near future. Office visit in 3 months.

## 2021-08-25 NOTE — Progress Notes (Signed)
Presenting complaint; ° °Follow for chronic liver disease and constipation. °Patient complains of heartburn despite taking medication. ° °Database and subjective: ° °Patient is 61-year-old Caucasian female with chronic liver disease who is here for scheduled visit following recent hospitalization for abdominal pain constipation.  Abdominal pelvic CT revealed cirrhotic liver with moderate ascites.  She received IV diuretic therapy.  Ultrasound-guided paracenteses revealed small pockets of ascites and tap was not recommended.  Patient was also felt to be constipated and treated. ° °She has a history of chronic liver disease.  She has been treated for chronic hepatitis on 2 different occasions.  First treatment session was in 2002.  She responded therapy.  She was treated f for different genotype in 2015 with SVR.  She was last seen in the office in 2016 elastography suggested F4 disease.  She did not return for follow-up visit. °She has been drinking alcohol in excessive amounts.  However she has not had any alcohol since her recent hospitalization last month. °She feels much better.  She has lost 22 pounds since recent hospitalization when she weighed 200 pounds.  Today she weighs 178.8 pounds. °She complains of daily heartburn despite taking pantoprazole.  She is watching her diet.  She says her abdomen is not distended anymore.  She is not having any abdominal pain.  Her appetite is good.  She is having 3-4 soft to formed stools daily.  She says she has 7 stools when she took first dose of Linzess but not anymore.  She says her stools are at times very dark.  She has not had any frank rectal bleeding. °She smokes 5 to 6 cigarettes/day.  She says she had quit cigarette smoking but then started back again 4 months ago.  She says this is mechanism of stress relief for her. °She reports developing airway obstruction when she had dental work-up possibly reaction due to what of her medications and she required  tracheostomy. ° ° °Current Medications: °Outpatient Encounter Medications as of 08/25/2021  °Medication Sig  ° albuterol (PROVENTIL) (2.5 MG/3ML) 0.083% nebulizer solution Take 3 mLs (2.5 mg total) by nebulization every 2 (two) hours as needed for wheezing.  ° albuterol (VENTOLIN HFA) 108 (90 Base) MCG/ACT inhaler Inhale 2 puffs into the lungs every 6 (six) hours as needed for wheezing or shortness of breath.  ° ciprofloxacin (CIPRO) 500 MG tablet Take 1 tablet (500 mg total) by mouth daily with breakfast.  ° furosemide (LASIX) 40 MG tablet Take 1 tablet (40 mg total) by mouth daily.  ° hydrOXYzine (ATARAX) 25 MG tablet Take 1 tablet (25 mg total) by mouth 3 (three) times daily as needed for anxiety or nausea (restlessness).  ° levothyroxine (SYNTHROID) 88 MCG tablet Take 88 mcg by mouth daily before breakfast.  ° linaclotide (LINZESS) 145 MCG CAPS capsule Take 145 mcg by mouth daily before breakfast.  ° lisinopril (PRINIVIL,ZESTRIL) 10 MG tablet Take 10 mg by mouth daily.  ° LYRICA 75 MG capsule Take 1 capsule by mouth daily.  ° mometasone-formoterol (DULERA) 200-5 MCG/ACT AERO Inhale 2 puffs into the lungs 2 (two) times daily.  ° Multiple Vitamin (MULTIVITAMIN WITH MINERALS) TABS tablet Take 1 tablet by mouth daily.  ° pantoprazole (PROTONIX) 40 MG tablet Take 1 tablet (40 mg total) by mouth 2 (two) times daily.  ° potassium chloride (KLOR-CON) 10 MEQ tablet Take 1 tablet (10 mEq total) by mouth daily. Take While taking Lasix/furosemide  ° [DISCONTINUED] linaclotide (LINZESS) 290 MCG CAPS capsule Take 1 capsule (290   mcg total) by mouth daily before breakfast.  ° [DISCONTINUED] nicotine (NICODERM CQ - DOSED IN MG/24 HOURS) 21 mg/24hr patch Place 1 patch (21 mg total) onto the skin daily.  ° °No facility-administered encounter medications on file as of 08/25/2021.  ° ° ° °Objective: °Blood pressure 120/77, pulse 93, temperature 98.3 °F (36.8 °C), temperature source Oral, height 5' 4" (1.626 m), weight 178 lb 12.8 oz  (81.1 kg). °Patient is alert and in no acute distress. °She does not have asterixis. °Conjunctiva is pink. Sclera is nonicteric °Oropharyngeal mucosa is normal. °No neck masses or thyromegaly noted. °Cardiac exam with regular rhythm normal S1 and S2. No murmur or gallop noted. °Lungs are clear to auscultation. °Abdomen is full.  Lower midline scar.  On palpation abdomen is soft and nontender with organomegaly or masses.  Shifting dullness is absent. °No LE edema or clubbing noted. ° °Labs/studies Results: ° ° °CBC Latest Ref Rng & Units 08/06/2021 08/05/2021 08/04/2021  °WBC 4.0 - 10.5 K/uL 7.0 6.9 11.9(H)  °Hemoglobin 12.0 - 15.0 g/dL 13.3 12.5 14.7  °Hematocrit 36.0 - 46.0 % 39.7 39.1 43.2  °Platelets 150 - 400 K/uL 134(L) 143(L) 217  °  °CMP Latest Ref Rng & Units 08/06/2021 08/05/2021 08/04/2021  °Glucose 70 - 99 mg/dL 84 92 102(H)  °BUN 6 - 20 mg/dL <5(L) 6 6  °Creatinine 0.44 - 1.00 mg/dL 0.58 0.70 0.68  °Sodium 135 - 145 mmol/L 138 136 135  °Potassium 3.5 - 5.1 mmol/L 3.1(L) 3.6 3.1(L)  °Chloride 98 - 111 mmol/L 98 97(L) 96(L)  °CO2 22 - 32 mmol/L 30 29 29  °Calcium 8.9 - 10.3 mg/dL 9.3 8.3(L) 8.4(L)  °Total Protein 6.5 - 8.1 g/dL 7.5 6.2(L) 6.6  °Total Bilirubin 0.3 - 1.2 mg/dL 1.8(H) 1.9(H) 1.9(H)  °Alkaline Phos 38 - 126 U/L 80 81 107  °AST 15 - 41 U/L 87(H) 89(H) 107(H)  °ALT 0 - 44 U/L 21 19 25  °  °Hepatic Function Latest Ref Rng & Units 08/06/2021 08/05/2021 08/04/2021  °Total Protein 6.5 - 8.1 g/dL 7.5 6.2(L) 6.6  °Albumin 3.5 - 5.0 g/dL 4.4 3.2(L) 2.7(L)  °AST 15 - 41 U/L 87(H) 89(H) 107(H)  °ALT 0 - 44 U/L 21 19 25  °Alk Phosphatase 38 - 126 U/L 80 81 107  °Total Bilirubin 0.3 - 1.2 mg/dL 1.8(H) 1.9(H) 1.9(H)  °Bilirubin, Direct 0.0 - 0.3 mg/dL - - -  ° ° ° °Assessment: ° °#1.  Cirrhosis.  Patient has a history of hepatitis C for which she has been treated twice with SVR.  HCVRNA by PCR during recent hospitalization was undetectable.  Another risk factor for liver disease is alcohol abuse.  She now  has developed decompensated liver disease with ascites.  She seem to be responding to diuretic therapy. °She will need to be screened for esophageal varices. ° °#2.  Chronic GERD.  EGD in January 2016 was negative for Barrett's.  She does not have alarm symptoms we will change her PPI and see how she does. ° °#3.  Chronic constipation.  She is doing well with Linzess. ° °#4.  Patient is average risk for CRC.  No polyps were found on colonoscopy of January 2016. ° ° °Plan: ° °Discontinue pantoprazole. °Begin esomeprazole 40 mg p.o. every morning. °Antireflux measures. °Patient will go to the lab for CBC, comprehensive chemistry panel and INR. °Patient will check with PCP about hepatitis A and B vaccination status.  If she has not been vaccinated she will need these   vaccines. °Patient advised to check her weight at least once or twice a week and call if there is change by more than 5 pounds in which case diuretic therapy may have to be adjusted. °We will schedule patient for esophagogastroduodenoscopy to screen for esophageal varices under monitored anesthesia care in near future. °Office visit in 3 months. ° ° ° ° ° °

## 2021-08-25 NOTE — Patient Instructions (Signed)
Physician will call with results of blood test when completed. Discontinue pantoprazole. Take Nexium/esomeprazole 40 mg by mouth 30 minutes before breakfast daily and Pepcid/famotidine OTC 20 mg after evening meal or at bedtime. Check weight once or twice a week.  Call if weight increases or decreases by more than 5 pounds. Esophagogastroduodenoscopy to be scheduled. Please check with primary care physician if you have had hepatitis A and B vaccination.  If not please get vaccinated.

## 2021-08-26 ENCOUNTER — Telehealth (INDEPENDENT_AMBULATORY_CARE_PROVIDER_SITE_OTHER): Payer: Self-pay | Admitting: *Deleted

## 2021-08-26 ENCOUNTER — Other Ambulatory Visit (INDEPENDENT_AMBULATORY_CARE_PROVIDER_SITE_OTHER): Payer: Self-pay | Admitting: *Deleted

## 2021-08-26 MED ORDER — LINACLOTIDE 145 MCG PO CAPS: 145.0000 ug | ORAL_CAPSULE | Freq: Every day | ORAL | 0 refills | Status: DC

## 2021-08-26 NOTE — Telephone Encounter (Signed)
Pt asked if she should continue linzess and do miralax. Per dr Laural Golden takes linzess and not miralax. Pt needed rx. Was seen yesterday. Med sent to pharm. Med may need PA. Pt was using samples. Await and see what pharm says.

## 2021-08-26 NOTE — Telephone Encounter (Signed)
Linzess not covered by insurance. Insurance will cover:   Lactulose  Lubiprostone  Symproic  Trulance  zelnorm

## 2021-08-27 ENCOUNTER — Telehealth (INDEPENDENT_AMBULATORY_CARE_PROVIDER_SITE_OTHER): Payer: Self-pay | Admitting: *Deleted

## 2021-08-27 NOTE — Telephone Encounter (Signed)
° °  Diagnosis: dark stools - R19.5   Result(s)   Card 1: Negative      Completed by:    HEMOCCULT SENSA DEVELOPER: LOT#: [id number]  EXPIRATION DATE: 6/24   HEMOCCULT SENSA CARD: LOT#: [id number] EXPIRATION DATE: 2/24   CARD CONTROL RESULTS: POSITIVE:   + NEGATIVE:  -    ADDITIONAL COMMENTS:  hemoccult card negative and patient was called and notified.

## 2021-08-28 LAB — COMPREHENSIVE METABOLIC PANEL
AG Ratio: 1 (calc) (ref 1.0–2.5)
ALT: 77 U/L — ABNORMAL HIGH (ref 6–29)
AST: 166 U/L — ABNORMAL HIGH (ref 10–35)
Albumin: 3.7 g/dL (ref 3.6–5.1)
Alkaline phosphatase (APISO): 86 U/L (ref 37–153)
BUN: 11 mg/dL (ref 7–25)
CO2: 26 mmol/L (ref 20–32)
Calcium: 10.2 mg/dL (ref 8.6–10.4)
Chloride: 105 mmol/L (ref 98–110)
Creat: 0.79 mg/dL (ref 0.50–1.05)
Globulin: 3.7 g/dL (calc) (ref 1.9–3.7)
Glucose, Bld: 92 mg/dL (ref 65–99)
Potassium: 4.2 mmol/L (ref 3.5–5.3)
Sodium: 140 mmol/L (ref 135–146)
Total Bilirubin: 1.7 mg/dL — ABNORMAL HIGH (ref 0.2–1.2)
Total Protein: 7.4 g/dL (ref 6.1–8.1)

## 2021-08-28 LAB — PROTIME-INR
INR: 1.3 — ABNORMAL HIGH
Prothrombin Time: 12.9 s — ABNORMAL HIGH (ref 9.0–11.5)

## 2021-08-28 LAB — CBC
HCT: 44.4 % (ref 35.0–45.0)
Hemoglobin: 15.3 g/dL (ref 11.7–15.5)
MCH: 33.5 pg — ABNORMAL HIGH (ref 27.0–33.0)
MCHC: 34.5 g/dL (ref 32.0–36.0)
MCV: 97.2 fL (ref 80.0–100.0)
MPV: 11.6 fL (ref 7.5–12.5)
Platelets: 146 10*3/uL (ref 140–400)
RBC: 4.57 10*6/uL (ref 3.80–5.10)
RDW: 13.1 % (ref 11.0–15.0)
WBC: 7.5 10*3/uL (ref 3.8–10.8)

## 2021-08-31 ENCOUNTER — Other Ambulatory Visit (INDEPENDENT_AMBULATORY_CARE_PROVIDER_SITE_OTHER): Payer: Self-pay | Admitting: *Deleted

## 2021-08-31 ENCOUNTER — Other Ambulatory Visit (INDEPENDENT_AMBULATORY_CARE_PROVIDER_SITE_OTHER): Payer: Self-pay

## 2021-08-31 DIAGNOSIS — K7460 Unspecified cirrhosis of liver: Secondary | ICD-10-CM

## 2021-08-31 DIAGNOSIS — R188 Other ascites: Secondary | ICD-10-CM

## 2021-08-31 MED ORDER — LUBIPROSTONE 24 MCG PO CAPS: 24.0000 ug | ORAL_CAPSULE | Freq: Two times a day (BID) | ORAL | 3 refills | Status: DC

## 2021-08-31 NOTE — Telephone Encounter (Signed)
Per dr Laural Golden. Change to amitiza 24 mcg one bid. May try one daily to start and if that helps stay with one daily. If not increase to one bid. Amitizia 24 mcg #60 one bid  Called and discussed with pt and patient verbalized understanding.

## 2021-09-01 ENCOUNTER — Telehealth (INDEPENDENT_AMBULATORY_CARE_PROVIDER_SITE_OTHER): Payer: Self-pay | Admitting: *Deleted

## 2021-09-01 NOTE — Telephone Encounter (Signed)
Fax from [place]. Request for coverage for lubiprostone 24 mcg is approved from 08/31/21 -08/30/24. Letter faxed to patient's pharm.

## 2021-09-02 ENCOUNTER — Ambulatory Visit (HOSPITAL_COMMUNITY)
Admission: RE | Admit: 2021-09-02 | Discharge: 2021-09-02 | Disposition: A | Discharge status: Home / Self Care | Payer: 59 | Attending: Gastroenterology | Admitting: Gastroenterology

## 2021-09-02 ENCOUNTER — Encounter (HOSPITAL_COMMUNITY): Payer: Self-pay | Admitting: Gastroenterology

## 2021-09-02 ENCOUNTER — Other Ambulatory Visit: Payer: Self-pay

## 2021-09-02 ENCOUNTER — Ambulatory Visit (HOSPITAL_COMMUNITY): Payer: 59 | Admitting: Certified Registered"

## 2021-09-02 ENCOUNTER — Encounter (HOSPITAL_COMMUNITY)
Admission: RE | Disposition: A | Discharge status: Home / Self Care | Payer: Self-pay | Source: Home / Self Care | Attending: Gastroenterology

## 2021-09-02 DIAGNOSIS — M797 Fibromyalgia: Secondary | ICD-10-CM | POA: Diagnosis not present

## 2021-09-02 DIAGNOSIS — K5909 Other constipation: Secondary | ICD-10-CM | POA: Insufficient documentation

## 2021-09-02 DIAGNOSIS — F17210 Nicotine dependence, cigarettes, uncomplicated: Secondary | ICD-10-CM | POA: Diagnosis not present

## 2021-09-02 DIAGNOSIS — K766 Portal hypertension: Secondary | ICD-10-CM | POA: Diagnosis not present

## 2021-09-02 DIAGNOSIS — R69 Illness, unspecified: Secondary | ICD-10-CM | POA: Diagnosis not present

## 2021-09-02 DIAGNOSIS — Z79890 Hormone replacement therapy (postmenopausal): Secondary | ICD-10-CM | POA: Insufficient documentation

## 2021-09-02 DIAGNOSIS — B1920 Unspecified viral hepatitis C without hepatic coma: Secondary | ICD-10-CM | POA: Insufficient documentation

## 2021-09-02 DIAGNOSIS — I8510 Secondary esophageal varices without bleeding: Secondary | ICD-10-CM | POA: Insufficient documentation

## 2021-09-02 DIAGNOSIS — J449 Chronic obstructive pulmonary disease, unspecified: Secondary | ICD-10-CM | POA: Diagnosis not present

## 2021-09-02 DIAGNOSIS — K219 Gastro-esophageal reflux disease without esophagitis: Secondary | ICD-10-CM | POA: Insufficient documentation

## 2021-09-02 DIAGNOSIS — I8500 Esophageal varices without bleeding: Secondary | ICD-10-CM | POA: Diagnosis not present

## 2021-09-02 DIAGNOSIS — Z79899 Other long term (current) drug therapy: Secondary | ICD-10-CM | POA: Insufficient documentation

## 2021-09-02 DIAGNOSIS — K3189 Other diseases of stomach and duodenum: Secondary | ICD-10-CM | POA: Insufficient documentation

## 2021-09-02 DIAGNOSIS — Z7951 Long term (current) use of inhaled steroids: Secondary | ICD-10-CM | POA: Insufficient documentation

## 2021-09-02 DIAGNOSIS — Z13810 Encounter for screening for upper gastrointestinal disorder: Secondary | ICD-10-CM | POA: Diagnosis not present

## 2021-09-02 DIAGNOSIS — K7460 Unspecified cirrhosis of liver: Secondary | ICD-10-CM | POA: Diagnosis not present

## 2021-09-02 HISTORY — PX: ESOPHAGOGASTRODUODENOSCOPY (EGD) WITH PROPOFOL: SHX5813

## 2021-09-02 SURGERY — ESOPHAGOGASTRODUODENOSCOPY (EGD) WITH PROPOFOL
Anesthesia: General

## 2021-09-02 MED ORDER — LIDOCAINE HCL (CARDIAC) PF 100 MG/5ML IV SOSY
PREFILLED_SYRINGE | INTRAVENOUS | Status: DC | PRN
  Administered 2021-09-02: 50 mg via INTRAVENOUS

## 2021-09-02 MED ORDER — PROPOFOL 10 MG/ML IV BOLUS
INTRAVENOUS | Status: DC | PRN
Start: 2021-09-02 — End: 2021-09-02
  Administered 2021-09-02: 100 mg via INTRAVENOUS

## 2021-09-02 MED ORDER — LACTATED RINGERS IV SOLN: INTRAVENOUS | Status: DC

## 2021-09-02 MED ORDER — PROPOFOL 500 MG/50ML IV EMUL
INTRAVENOUS | Status: DC | PRN
  Administered 2021-09-02: 150 ug/kg/min via INTRAVENOUS

## 2021-09-02 NOTE — Interval H&P Note (Signed)
History and Physical Interval Note:  09/02/2021 8:21 AM  Karla Young  has presented today for surgery, with the diagnosis of Screen for Varices.  The various methods of treatment have been discussed with the patient and family. After consideration of risks, benefits and other options for treatment, the patient has consented to  Procedure(s) with comments: ESOPHAGOGASTRODUODENOSCOPY (EGD) WITH PROPOFOL (N/A) - 830 as a surgical intervention.  The patient's history has been reviewed, patient examined, no change in status, stable for surgery.  I have reviewed the patient's chart and labs.  Questions were answered to the patient's satisfaction.     [name]

## 2021-09-02 NOTE — Anesthesia Preprocedure Evaluation (Addendum)
Anesthesia Evaluation  Patient identified by MRN, date of birth, ID band Patient awake    Reviewed: Allergy & Precautions, H&P , NPO status , Patient's Chart, lab work & pertinent test results, reviewed documented beta blocker date and time   Airway Mallampati: II  TM Distance: >3 FB Neck ROM: full    Dental no notable dental hx. (+) Partial Upper   Pulmonary COPD, Current Smoker,    Pulmonary exam normal breath sounds clear to auscultation       Cardiovascular Exercise Tolerance: Good hypertension, negative cardio ROS   Rhythm:regular Rate:Normal     Neuro/Psych  Headaches, PSYCHIATRIC DISORDERS Anxiety  Neuromuscular disease    GI/Hepatic GERD  Medicated,(+) Cirrhosis       , Hepatitis -, C  Endo/Other  Hypothyroidism   Renal/GU negative Renal ROS  negative genitourinary   Musculoskeletal   Abdominal   Peds  Hematology negative hematology ROS (+)   Anesthesia Other Findings   Reproductive/Obstetrics negative OB ROS                            Anesthesia Physical Anesthesia Plan  ASA: 3  Anesthesia Plan: General   Post-op Pain Management:    Induction:   PONV Risk Score and Plan: Propofol infusion  Airway Management Planned:   Additional Equipment:   Intra-op Plan:   Post-operative Plan:   Informed Consent: I have reviewed the patients History and Physical, chart, labs and discussed the procedure including the risks, benefits and alternatives for the proposed anesthesia with the patient or authorized representative who has indicated his/her understanding and acceptance.     Dental Advisory Given  Plan Discussed with: CRNA  Anesthesia Plan Comments:         Anesthesia Quick Evaluation

## 2021-09-02 NOTE — Discharge Instructions (Signed)
You are being discharged to home.  ?Resume your previous diet.  ?Your physician has recommended a repeat upper endoscopy in one year for surveillance.  ?Alcohol avoidance. ?

## 2021-09-02 NOTE — Anesthesia Procedure Notes (Signed)
Date/Time: 09/02/2021 8:38 AM Performed by: Julian Reil, CRNA Pre-anesthesia Checklist: Patient identified, Emergency Drugs available, Suction available and Patient being monitored Patient Re-evaluated:Patient Re-evaluated prior to induction Oxygen Delivery Method: Nasal cannula Induction Type: IV induction Placement Confirmation: positive ETCO2

## 2021-09-02 NOTE — Transfer of Care (Signed)
Immediate Anesthesia Transfer of Care Note  Patient: Karla Young  Procedure(s) Performed: ESOPHAGOGASTRODUODENOSCOPY (EGD) WITH PROPOFOL  Patient Location: Endoscopy Unit  Anesthesia Type:General  Level of Consciousness: awake  Airway & Oxygen Therapy: Patient Spontanous Breathing  Post-op Assessment: Report given to RN and Post -op Vital signs reviewed and stable  Post vital signs: Reviewed and stable  Last Vitals:  Vitals Value Taken Time  BP    Temp    Pulse    Resp    SpO2      Last Pain:  Vitals:   09/02/21 0829  TempSrc:   PainSc: 7       Patients Stated Pain Goal: 7 (09/02/21 0732)  Complications: No notable events documented.

## 2021-09-02 NOTE — Op Note (Signed)
[facility] Patient Name: Karla Young Procedure Date: 09/02/2021 8:22 AM MRN: [id number] Date of Birth: 08/31/1960 Attending MD: Katrinka Blazinganiel Castaneda ,  CSN: [id number] Age: 61 Admit Type: Outpatient Procedure:                Upper GI endoscopy Indications:              Cirrhosis rule out esophageal varices Providers:                Katrinka Blazinganiel Castaneda, Angelica RanSusan Goss, Kristine L. Jessee AversBoone                            Tech, Technician Referring MD:              Medicines:                Monitored Anesthesia Care Complications:            No immediate complications. Estimated Blood Loss:     Estimated blood loss: none. Procedure:                Pre-Anesthesia Assessment:                           - Prior to the procedure, a History and Physical                            was performed, and patient medications, allergies                            and sensitivities were reviewed. The patient's                            tolerance of previous anesthesia was reviewed.                           - The risks and benefits of the procedure and the                            sedation options and risks were discussed with the                            patient. All questions were answered and informed                            consent was obtained.                           - ASA Grade Assessment: III - A patient with severe                            systemic disease.                           After obtaining informed consent, the endoscope was                            passed under direct vision. Throughout the  procedure, the patient's blood pressure, pulse, and                            oxygen saturations were monitored continuously. The                            [id number]-[id number] ([id number]) scope was introduced through the                            mouth, and advanced to the second part of duodenum.                            The upper GI endoscopy was accomplished without                             difficulty. The patient tolerated the procedure                            well. Scope In: 8:34:06 AM Scope Out: 8:39:06 AM Total Procedure Duration: 0 hours 5 minutes 0 seconds  Findings:      Grade I varices were found in the lower third of the esophagus. They       were diminutive in size.      Mild portal hypertensive gastropathy was found in the entire examined       stomach. Upon careful inspection, no gastric varices were observed.      The examined duodenum was normal. Impression:               - Grade I esophageal varices.                           - Portal hypertensive gastropathy.                           - Normal examined duodenum.                           - No specimens collected. Moderate Sedation:      Per Anesthesia Care Recommendation:           - Discharge patient to home (ambulatory).                           - Resume previous diet.                           - Repeat upper endoscopy in 1 year for surveillance.                           - Alcohol avoidance. Procedure Code(s):        --- Professional ---                           [phone], Esophagogastroduodenoscopy, flexible,                            transoral; diagnostic, including collection of  specimen(s) by brushing or washing, when performed                            (separate procedure) Diagnosis Code(s):        --- Professional ---                           K74.60, Unspecified cirrhosis of liver                           I85.10, Secondary esophageal varices without                            bleeding                           K76.6, Portal hypertension                           K31.89, Other diseases of stomach and duodenum CPT copyright 2019 American Medical Association. All rights reserved. The codes documented in this report are preliminary and upon coder review may  be revised to meet current compliance requirements. [name], MD [name],  09/02/2021 8:45:12 AM This report has been signed electronically. Number of Addenda: 0

## 2021-09-02 NOTE — Anesthesia Postprocedure Evaluation (Signed)
Anesthesia Post Note  Patient: Artist  Procedure(s) Performed: ESOPHAGOGASTRODUODENOSCOPY (EGD) WITH PROPOFOL  Patient location during evaluation: Phase II Anesthesia Type: General Level of consciousness: awake Pain management: pain level controlled Vital Signs Assessment: post-procedure vital signs reviewed and stable Respiratory status: spontaneous breathing and respiratory function stable Cardiovascular status: blood pressure returned to baseline and stable Postop Assessment: no headache and no apparent nausea or vomiting Anesthetic complications: no Comments: Late entry   No notable events documented.   Last Vitals:  Vitals:   09/02/21 0732 09/02/21 0843  BP: 128/86 120/76  Pulse: 82 80  Resp: 17 14  Temp: 37.1 C 36.8 C  SpO2: 98% 97%    Last Pain:  Vitals:   09/02/21 0843  TempSrc: Axillary  PainSc: 8                  Windell Norfolk

## 2021-09-04 ENCOUNTER — Encounter (HOSPITAL_COMMUNITY): Payer: Self-pay | Admitting: Gastroenterology

## 2021-09-18 ENCOUNTER — Telehealth (INDEPENDENT_AMBULATORY_CARE_PROVIDER_SITE_OTHER): Payer: Self-pay | Admitting: *Deleted

## 2021-09-18 NOTE — Telephone Encounter (Signed)
Pt  was changed from pantoprazole to esomeprazole at last visit and it is not working for her. She states she use to be on aciphex but insurance would not cover back then. She states that med worked good for her and would like to try the generic form of aciphex.   [facility] [place].

## 2021-09-22 ENCOUNTER — Other Ambulatory Visit (INDEPENDENT_AMBULATORY_CARE_PROVIDER_SITE_OTHER): Payer: Self-pay | Admitting: Internal Medicine

## 2021-09-22 MED ORDER — RABEPRAZOLE SODIUM 20 MG PO TBEC: 20.0000 mg | DELAYED_RELEASE_TABLET | Freq: Every day | ORAL | 5 refills | Status: DC

## 2021-09-23 NOTE — Telephone Encounter (Signed)
Pt.notified

## 2021-09-23 NOTE — Telephone Encounter (Signed)
Ok per Dr. Karilyn Cota and he did send in a prescription. Tried to call patient 3 times yesterday after med was sent in and no answer. Unable to leave a message and I called her again this morning and and no answer.

## 2021-10-28 ENCOUNTER — Telehealth (INDEPENDENT_AMBULATORY_CARE_PROVIDER_SITE_OTHER): Payer: Self-pay | Admitting: *Deleted

## 2021-10-28 NOTE — Telephone Encounter (Signed)
Copied from reminder file. ?Metro Kung, LPN  Metro Kung, LPN ?Per dr Karilyn Cota needs bw in 2 weeks ( around feb 3rd)  ?I called pt she states to put order in mail and she will do in 2 weeks. Orders mailed to patient. ? ?Orders for inr/pt and liver put in on 08/31/21 and was mailed to pt.  ?

## 2021-11-03 DIAGNOSIS — Z6830 Body mass index (BMI) 30.0-30.9, adult: Secondary | ICD-10-CM | POA: Diagnosis not present

## 2021-11-03 DIAGNOSIS — K7460 Unspecified cirrhosis of liver: Secondary | ICD-10-CM | POA: Diagnosis not present

## 2021-11-03 DIAGNOSIS — R748 Abnormal levels of other serum enzymes: Secondary | ICD-10-CM | POA: Diagnosis not present

## 2021-11-03 DIAGNOSIS — I10 Essential (primary) hypertension: Secondary | ICD-10-CM | POA: Diagnosis not present

## 2021-11-03 DIAGNOSIS — Z720 Tobacco use: Secondary | ICD-10-CM | POA: Diagnosis not present

## 2021-11-03 DIAGNOSIS — R69 Illness, unspecified: Secondary | ICD-10-CM | POA: Diagnosis not present

## 2021-12-14 DIAGNOSIS — R69 Illness, unspecified: Secondary | ICD-10-CM | POA: Diagnosis not present

## 2021-12-14 DIAGNOSIS — R109 Unspecified abdominal pain: Secondary | ICD-10-CM | POA: Diagnosis not present

## 2021-12-14 DIAGNOSIS — R748 Abnormal levels of other serum enzymes: Secondary | ICD-10-CM | POA: Diagnosis not present

## 2021-12-14 DIAGNOSIS — Z713 Dietary counseling and surveillance: Secondary | ICD-10-CM | POA: Diagnosis not present

## 2021-12-14 DIAGNOSIS — Z6829 Body mass index (BMI) 29.0-29.9, adult: Secondary | ICD-10-CM | POA: Diagnosis not present

## 2021-12-14 DIAGNOSIS — I10 Essential (primary) hypertension: Secondary | ICD-10-CM | POA: Diagnosis not present

## 2021-12-14 DIAGNOSIS — Z7182 Exercise counseling: Secondary | ICD-10-CM | POA: Diagnosis not present

## 2021-12-14 DIAGNOSIS — K7460 Unspecified cirrhosis of liver: Secondary | ICD-10-CM | POA: Diagnosis not present

## 2021-12-14 DIAGNOSIS — Z720 Tobacco use: Secondary | ICD-10-CM | POA: Diagnosis not present

## 2021-12-14 DIAGNOSIS — Z6830 Body mass index (BMI) 30.0-30.9, adult: Secondary | ICD-10-CM | POA: Diagnosis not present

## 2021-12-14 DIAGNOSIS — Z79899 Other long term (current) drug therapy: Secondary | ICD-10-CM | POA: Diagnosis not present

## 2021-12-14 DIAGNOSIS — E039 Hypothyroidism, unspecified: Secondary | ICD-10-CM | POA: Diagnosis not present

## 2021-12-17 DIAGNOSIS — R3911 Hesitancy of micturition: Secondary | ICD-10-CM | POA: Diagnosis not present

## 2021-12-25 ENCOUNTER — Other Ambulatory Visit (HOSPITAL_COMMUNITY): Payer: Self-pay | Admitting: Sports Medicine

## 2021-12-25 DIAGNOSIS — K7460 Unspecified cirrhosis of liver: Secondary | ICD-10-CM | POA: Diagnosis not present

## 2021-12-25 DIAGNOSIS — Z124 Encounter for screening for malignant neoplasm of cervix: Secondary | ICD-10-CM | POA: Diagnosis not present

## 2021-12-25 DIAGNOSIS — Z720 Tobacco use: Secondary | ICD-10-CM | POA: Diagnosis not present

## 2021-12-25 DIAGNOSIS — R69 Illness, unspecified: Secondary | ICD-10-CM | POA: Diagnosis not present

## 2021-12-25 DIAGNOSIS — R109 Unspecified abdominal pain: Secondary | ICD-10-CM | POA: Diagnosis not present

## 2021-12-25 DIAGNOSIS — Z79899 Other long term (current) drug therapy: Secondary | ICD-10-CM | POA: Diagnosis not present

## 2021-12-25 DIAGNOSIS — R748 Abnormal levels of other serum enzymes: Secondary | ICD-10-CM | POA: Diagnosis not present

## 2021-12-25 DIAGNOSIS — Z1231 Encounter for screening mammogram for malignant neoplasm of breast: Secondary | ICD-10-CM

## 2021-12-25 DIAGNOSIS — J449 Chronic obstructive pulmonary disease, unspecified: Secondary | ICD-10-CM | POA: Diagnosis not present

## 2021-12-25 DIAGNOSIS — I10 Essential (primary) hypertension: Secondary | ICD-10-CM | POA: Diagnosis not present

## 2021-12-28 ENCOUNTER — Ambulatory Visit (HOSPITAL_COMMUNITY)
Admission: RE | Admit: 2021-12-28 | Discharge: 2021-12-28 | Disposition: A | Discharge status: Home / Self Care | Payer: 59 | Source: Ambulatory Visit | Attending: Sports Medicine | Admitting: Sports Medicine

## 2021-12-28 DIAGNOSIS — Z1231 Encounter for screening mammogram for malignant neoplasm of breast: Secondary | ICD-10-CM | POA: Insufficient documentation

## 2022-02-05 DIAGNOSIS — K219 Gastro-esophageal reflux disease without esophagitis: Secondary | ICD-10-CM | POA: Diagnosis not present

## 2022-02-05 DIAGNOSIS — Z713 Dietary counseling and surveillance: Secondary | ICD-10-CM | POA: Diagnosis not present

## 2022-02-05 DIAGNOSIS — Z6827 Body mass index (BMI) 27.0-27.9, adult: Secondary | ICD-10-CM | POA: Diagnosis not present

## 2022-02-05 DIAGNOSIS — Z7182 Exercise counseling: Secondary | ICD-10-CM | POA: Diagnosis not present

## 2022-02-05 DIAGNOSIS — Z720 Tobacco use: Secondary | ICD-10-CM | POA: Diagnosis not present

## 2022-02-05 DIAGNOSIS — R69 Illness, unspecified: Secondary | ICD-10-CM | POA: Diagnosis not present

## 2022-02-22 ENCOUNTER — Encounter (INDEPENDENT_AMBULATORY_CARE_PROVIDER_SITE_OTHER): Payer: Self-pay | Admitting: Gastroenterology

## 2022-02-22 ENCOUNTER — Ambulatory Visit (INDEPENDENT_AMBULATORY_CARE_PROVIDER_SITE_OTHER): Payer: 59 | Admitting: Gastroenterology

## 2022-02-22 VITALS — BP 124/69 | HR 75 | Temp 99.3°F | Ht 63.0 in | Wt 143.9 lb

## 2022-02-22 DIAGNOSIS — F1010 Alcohol abuse, uncomplicated: Secondary | ICD-10-CM

## 2022-02-22 DIAGNOSIS — K760 Fatty (change of) liver, not elsewhere classified: Secondary | ICD-10-CM | POA: Diagnosis not present

## 2022-02-22 DIAGNOSIS — K582 Mixed irritable bowel syndrome: Secondary | ICD-10-CM

## 2022-02-22 DIAGNOSIS — K7031 Alcoholic cirrhosis of liver with ascites: Secondary | ICD-10-CM

## 2022-02-22 DIAGNOSIS — Z8619 Personal history of other infectious and parasitic diseases: Secondary | ICD-10-CM | POA: Insufficient documentation

## 2022-02-22 DIAGNOSIS — R69 Illness, unspecified: Secondary | ICD-10-CM | POA: Diagnosis not present

## 2022-02-22 NOTE — Progress Notes (Addendum)
[name], M.D. Gastroenterology & Hepatology [facility] [address] [place], [place] [address]  Primary Care Physician: Waldon Reining, MD [address] [place] [address]  I will communicate my assessment and recommendations to the referring MD via EMR.  Problems: Decompensated liver cirrhosis secondary to hepatitis C and alcoholism Mild ascites on diuretic management Grade 1 esophageal varices IBS Active alcohol abuse  History of Present Illness: Karla Young is a 61 y.o. female with past medical history of decompensated liver cirrhosis secondary to hepatitis C and alcoholism complicated by ascites and nonbleeding grade 1 esophageal varices, active alcohol abuse, IBS, fibromyalgia, COPD, anxiety, hypertension, hypothyroidism, GERD and IBS, who presents for follow up of cirrhosis.  The patient was last seen on 08/25/2021. At that time, the patient was advised to start his omeprazole 40 mg every morning and to continue diuretics for ascites.  States that she has felt relatively well. She reports that she has some watery Bms per day recently, has 1-2 Bms per day and actually stopped using Linzess due to this. The patient denies having any nausea, vomiting, fever, hematochezia, melena, hematemesis, abdominal distention, abdominal pain, diarrhea, jaundice, pruritus or weight loss.  States that she is currently using furosemide intermittently as she does not have significant distention in her abdomen or legs on a daily basis.  States that she has felt a hernia is popping out of her ventral side and is causing discomfort occasionally but the hernia reduces on its own.  Patient reports she "stopped drinking" this Saturday. She had 4-5 beers. She states that she was drinking 4-6 beers a day prior to this. Denies any liquor.  Cirrhosis related questions: Hematemesis/coffee ground emesis: No History of variceal bleeding:  No Abdominal pain: No Abdominal distention/worsening ascitesNo Episodes of confusion/disorientation: No Number of daily bowel movements:1-2 Taking diuretics?:  Yes, furosemide 20 mg - takes it intermittently. Prior history of banding?: No Prior episodes of SBP: No Last time liver imaging was performed: 08/03/2021 CT of the abdomen and pelvis with IV contrast, there was presence of heterogeneous hypodensities throughout the liver due to questionable steatosis, moderate ascites. MELD score: 12 (08/27/2021  Last EGD: 09/02/2021 - Grade I esophageal varices. - Portal hypertensive gastropathy. - Normal examined duodenum. - No specimens collected.  Repeat EGD in 2 years recommended  Last Colonoscopy: 2016 Normal mucosa of terminal ileum. Mild changes of melanosis coli involving sigmoid colon. Scarring noted at 20 cm from anal margin secondary to prior surgery.  Past Medical History: Past Medical History:  Diagnosis Date   Anxiety    COPD (chronic obstructive pulmonary disease) (HCC)    Fibromyalgia    GERD (gastroesophageal reflux disease)    Hepatitis C    Hypertension    Hypothyroid    Migraines    MRSA (methicillin resistant staph aureus) culture positive     Past Surgical History: Past Surgical History:  Procedure Laterality Date   ABDOMINAL HYSTERECTOMY     partial   BACK SURGERY     lumbar removal of disc   BIOPSY  09/06/2014   Procedure: BIOPSY;  Surgeon: Malissa Hippo, MD;  Location: [facility];  Service: Endoscopy;;   CHOLECYSTECTOMY     COLONOSCOPY WITH PROPOFOL N/A 09/06/2014   Procedure: COLONOSCOPY WITH PROPOFOL (Procedure #2) At cecum at 0819; total withdrawal time=13 minutes;  Surgeon: Malissa Hippo, MD;  Location: [facility];  Service: Endoscopy;  Laterality: N/A;   ESOPHAGEAL DILATION N/A 09/06/2014  Procedure: ESOPHAGEAL DILATION WITH MALONEY DILATOR ;  Surgeon: Malissa Hippo, MD;  Location: [facility];  Service: Endoscopy;  Laterality: N/A;    ESOPHAGOGASTRODUODENOSCOPY (EGD) WITH PROPOFOL N/A 09/06/2014   Procedure: ESOPHAGOGASTRODUODENOSCOPY (EGD) WITH PROPOFOL (Procedure #1) ;  Surgeon: Malissa Hippo, MD;  Location: [facility];  Service: Endoscopy;  Laterality: N/A;   ESOPHAGOGASTRODUODENOSCOPY (EGD) WITH PROPOFOL N/A 09/02/2021   Procedure: ESOPHAGOGASTRODUODENOSCOPY (EGD) WITH PROPOFOL;  Surgeon: Dolores Frame, MD;  [facility]: [facility];  Service: Gastroenterology;  Laterality: N/A;  830   HEMORRHOID SURGERY     Dr Vicenta Aly   PARTIAL HYSTERECTOMY      One ovary left   RECTAL PROLAPSE REPAIR      Family History: Family History  Problem Relation Age of Onset   Colon cancer Neg Hx    Colon polyps Neg Hx     Social History: Social History   Tobacco Use  Smoking Status Former   Packs/day: 0.25   Years: 30.00   Total pack years: 7.50   Types: Cigarettes  Smokeless Tobacco Never  Tobacco Comments   3-4 cigs a day since age 34.   Social History   Substance and Sexual Activity  Alcohol Use Not Currently   Social History   Substance and Sexual Activity  Drug Use No    Allergies: No Known Allergies  Medications: Current Outpatient Medications  Medication Sig Dispense Refill   albuterol (PROVENTIL) (2.5 MG/3ML) 0.083% nebulizer solution Take 3 mLs (2.5 mg total) by nebulization every 2 (two) hours as needed for wheezing. 75 mL 12   albuterol (VENTOLIN HFA) 108 (90 Base) MCG/ACT inhaler Inhale 2 puffs into the lungs every 6 (six) hours as needed for wheezing or shortness of breath. 1 each 2   furosemide (LASIX) 40 MG tablet Take 0.5 tablets (20 mg total) by mouth daily. 30 tablet 1   levothyroxine (SYNTHROID) 88 MCG tablet Take 88 mcg by mouth daily before breakfast.     linaclotide (LINZESS) 145 MCG CAPS capsule Take 145 mcg by mouth daily before breakfast.     lisinopril (PRINIVIL,ZESTRIL) 10 MG tablet Take 10 mg by mouth daily.     LYRICA 75 MG capsule Take 1 capsule by mouth daily.  0    mometasone-formoterol (DULERA) 200-5 MCG/ACT AERO Inhale 2 puffs into the lungs 2 (two) times daily. 13 g 1   Multiple Vitamin (MULTIVITAMIN WITH MINERALS) TABS tablet Take 1 tablet by mouth daily. 30 tablet 1   omeprazole (PRILOSEC) 40 MG capsule Take 40 mg by mouth daily.     potassium chloride (KLOR-CON) 10 MEQ tablet Take 1 tablet (10 mEq total) by mouth daily. Take While taking Lasix/furosemide 30 tablet 1   No current facility-administered medications for this visit.    Review of Systems: GENERAL: negative for malaise, night sweats HEENT: No changes in hearing or vision, no nose bleeds or other nasal problems. NECK: Negative for lumps, goiter, pain and significant neck swelling RESPIRATORY: Negative for cough, wheezing CARDIOVASCULAR: Negative for chest pain, leg swelling, palpitations, orthopnea GI: SEE HPI MUSCULOSKELETAL: Negative for joint pain or swelling, back pain, and muscle pain. SKIN: Negative for lesions, rash PSYCH: Negative for sleep disturbance, mood disorder and recent psychosocial stressors. HEMATOLOGY Negative for prolonged bleeding, bruising easily, and swollen nodes. ENDOCRINE: Negative for cold or heat intolerance, polyuria, polydipsia and goiter. NEURO: negative for tremor, gait imbalance, syncope and seizures. The remainder of the review of systems is noncontributory.   Physical Exam: BP 124/69 (BP Location:  Left Arm, Patient Position: Sitting, Cuff Size: Small)   Pulse 75   Temp 99.3 F (37.4 C) (Oral)   Ht 5\' 3"  (1.6 m)   Wt 143 lb 14.4 oz (65.3 kg)   BMI 25.49 kg/m  GENERAL: The patient is AO x3, in no acute distress. HEENT: Head is normocephalic and atraumatic. EOMI are intact. Mouth is well hydrated and without lesions. NECK: Supple. No masses LUNGS: Clear to auscultation. No presence of rhonchi/wheezing/rales. Adequate chest expansion HEART: RRR, normal s1 and s2. ABDOMEN: mildly tender upon diffuse palpation, no guarding, no peritoneal signs,  and nondistended. BS +. Has presence of supraumbilical small ventral hernia which is reducible. EXTREMITIES: Without any cyanosis, clubbing, rash, lesions or edema. NEUROLOGIC: AOx3, no focal motor deficit. SKIN: no jaundice, no rashes  Imaging/Labs: as above  I personally reviewed and interpreted the available labs, imaging and endoscopic files.  Impression and Plan: Karla Young is a 61 y.o. female with past medical history of decompensated liver cirrhosis secondary to hepatitis C and alcoholism complicated by ascites and nonbleeding grade 1 esophageal varices, active alcohol abuse, IBS, fibromyalgia, COPD, anxiety, hypertension, hypothyroidism, GERD and IBS, who presents for follow up of cirrhosis.  Unfortunately, the patient has continuously been drinking alcohol despite the fact we have emphasized the importance of complete alcohol cessation.  She states that she stopped drinking alcohol 2 days ago but I had a thorough conversation with her regarding importance of completely avoiding any alcohol intake including beer intake.  She understood and will work on this.  She had ascites as her decompensating event, and has not presented any recurrent episodes of clinical ascites while taking Lasix as needed.  We will continue her on the current dose of Lasix.  Has not presented any episodes of encephalopathy.  No overt gastrointestinal bleeding and had only small varices during her last EGD, will need to have a repeat EGD 2 years from her last procedure.  We will update her MELD labs today and repeat HCC screening with AFP and a CT of the liver as the patient had heterogeneous hypodensities throughout her liver during the most recent CT of the abdomen.  We also discussed the fact that her hernia is not meeting criteria for urgent surgical management and given her cirrhosis, I really this should be postponed as much as possible.  Finally, she has presented some loose bowel movements which may improve  with the use of fiber supplements.  - Patient was counseled regarding the importance of alcohol cessation. The patient was informed about the long term effects of continuous alcohol intake and the importance of stopping alcohol for her liver disease. - Start Benefiber fiber supplements daily to increase stool bulk - Continue with furosemide as needed - Check CBC, MELD labs and AFP - Schedule CT liver - Reduce salt intake to <2 g per day - Can take Tylenol max of 2 g per day (650 mg q8h) for pain - Avoid NSAIDs for pain - Avoid eating raw oysters/shellfish - Protein shake (Ensure or Boost) every night before going to sleep - RTC 6 months  All questions were answered.      67, MD Gastroenterology and Hepatology [facility]

## 2022-02-22 NOTE — Patient Instructions (Addendum)
-   Patient was counseled regarding the importance of alcohol cessation. The patient was informed about the long term effects of continuous alcohol intake and the importance of stopping alcohol for her liver disease. - Start Benefiber fiber supplements daily to increase stool bulk - Continue with furosemide as needed - Check CBC, MELD labs and AFP - Schedule CT liver - Reduce salt intake to <2 g per day - Can take Tylenol max of 2 g per day (650 mg q8h) for pain - Avoid NSAIDs for pain - Avoid eating raw oysters/shellfish - Protein shake (Ensure or Boost) every night before going to sleep

## 2022-02-23 DIAGNOSIS — F330 Major depressive disorder, recurrent, mild: Secondary | ICD-10-CM | POA: Diagnosis not present

## 2022-02-23 DIAGNOSIS — Z8619 Personal history of other infectious and parasitic diseases: Secondary | ICD-10-CM | POA: Diagnosis not present

## 2022-02-23 DIAGNOSIS — F419 Anxiety disorder, unspecified: Secondary | ICD-10-CM | POA: Diagnosis not present

## 2022-02-23 DIAGNOSIS — R69 Illness, unspecified: Secondary | ICD-10-CM | POA: Diagnosis not present

## 2022-02-24 LAB — CBC WITH DIFFERENTIAL/PLATELET
Absolute Monocytes: 476 cells/uL (ref 200–950)
Basophils Absolute: 58 cells/uL (ref 0–200)
Basophils Relative: 1 %
Eosinophils Absolute: 244 cells/uL (ref 15–500)
Eosinophils Relative: 4.2 %
HCT: 33.1 % — ABNORMAL LOW (ref 35.0–45.0)
Hemoglobin: 12 g/dL (ref 11.7–15.5)
Lymphs Abs: 2146 cells/uL (ref 850–3900)
MCH: 33.1 pg — ABNORMAL HIGH (ref 27.0–33.0)
MCHC: 36.3 g/dL — ABNORMAL HIGH (ref 32.0–36.0)
MCV: 91.4 fL (ref 80.0–100.0)
MPV: 11.2 fL (ref 7.5–12.5)
Monocytes Relative: 8.2 %
Neutro Abs: 2877 cells/uL (ref 1500–7800)
Neutrophils Relative %: 49.6 %
Platelets: 78 10*3/uL — ABNORMAL LOW (ref 140–400)
RBC: 3.62 10*6/uL — ABNORMAL LOW (ref 3.80–5.10)
RDW: 15.2 % — ABNORMAL HIGH (ref 11.0–15.0)
Total Lymphocyte: 37 %
WBC: 5.8 10*3/uL (ref 3.8–10.8)

## 2022-02-24 LAB — COMPREHENSIVE METABOLIC PANEL
AG Ratio: 0.9 (calc) — ABNORMAL LOW (ref 1.0–2.5)
ALT: 40 U/L — ABNORMAL HIGH (ref 6–29)
AST: 74 U/L — ABNORMAL HIGH (ref 10–35)
Albumin: 3.4 g/dL — ABNORMAL LOW (ref 3.6–5.1)
Alkaline phosphatase (APISO): 124 U/L (ref 37–153)
BUN/Creatinine Ratio: 8 (calc) (ref 6–22)
BUN: 6 mg/dL — ABNORMAL LOW (ref 7–25)
CO2: 26 mmol/L (ref 20–32)
Calcium: 9.4 mg/dL (ref 8.6–10.4)
Chloride: 102 mmol/L (ref 98–110)
Creat: 0.74 mg/dL (ref 0.50–1.05)
Globulin: 3.6 g/dL (calc) (ref 1.9–3.7)
Glucose, Bld: 84 mg/dL (ref 65–99)
Potassium: 3.5 mmol/L (ref 3.5–5.3)
Sodium: 138 mmol/L (ref 135–146)
Total Bilirubin: 5 mg/dL — ABNORMAL HIGH (ref 0.2–1.2)
Total Protein: 7 g/dL (ref 6.1–8.1)

## 2022-02-24 LAB — AFP TUMOR MARKER: AFP-Tumor Marker: 6.1 ng/mL — ABNORMAL HIGH

## 2022-02-24 LAB — PROTIME-INR
INR: 1.9 — ABNORMAL HIGH
Prothrombin Time: 18.9 s — ABNORMAL HIGH (ref 9.0–11.5)

## 2022-02-25 ENCOUNTER — Telehealth (INDEPENDENT_AMBULATORY_CARE_PROVIDER_SITE_OTHER): Payer: Self-pay | Admitting: Gastroenterology

## 2022-02-25 ENCOUNTER — Other Ambulatory Visit (INDEPENDENT_AMBULATORY_CARE_PROVIDER_SITE_OTHER): Payer: Self-pay

## 2022-02-25 DIAGNOSIS — R188 Other ascites: Secondary | ICD-10-CM

## 2022-02-25 NOTE — Telephone Encounter (Signed)
Spoke with the patient today regarding the results of her recent blood work-up which showed elevation of her MELD score up to 21.  She endorses that she is actively drinking and has not stopped drinking yet.  She understood that it is important for her to quit drinking to avoid further progression of her liver disease. I also inform her that her insurance denied the coverage of her CT liver despite of the fact that there is a CT scan of the abdomen and pelvis with IV contrast performed on 08/03/2021 which showed presence of heterogeneous hypodensities throughout the liver.  I consider that this will need to be further evaluated.  This could be unable to do CT liver protocol or with an MRI.  We will order an MRI as the insurance denied the imaging testing for unclear reasons.  Hi Karla Young,   Can you please schedule a MRI of the abdomen with and without contrast? Dx: Liver cirrhosis, hypodensities in the liver.   Thanks,  [name], MD Gastroenterology and Hepatology [facility]

## 2022-03-16 ENCOUNTER — Other Ambulatory Visit (INDEPENDENT_AMBULATORY_CARE_PROVIDER_SITE_OTHER): Payer: Self-pay | Admitting: Gastroenterology

## 2022-03-16 ENCOUNTER — Ambulatory Visit (HOSPITAL_COMMUNITY)
Admission: RE | Admit: 2022-03-16 | Discharge: 2022-03-16 | Disposition: A | Discharge status: Home / Self Care | Payer: 59 | Source: Ambulatory Visit | Attending: Gastroenterology | Admitting: Gastroenterology

## 2022-03-16 DIAGNOSIS — R188 Other ascites: Secondary | ICD-10-CM | POA: Diagnosis not present

## 2022-03-16 DIAGNOSIS — R161 Splenomegaly, not elsewhere classified: Secondary | ICD-10-CM | POA: Diagnosis not present

## 2022-03-16 DIAGNOSIS — K7031 Alcoholic cirrhosis of liver with ascites: Secondary | ICD-10-CM

## 2022-03-16 DIAGNOSIS — K7460 Unspecified cirrhosis of liver: Secondary | ICD-10-CM | POA: Diagnosis not present

## 2022-03-16 DIAGNOSIS — K7290 Hepatic failure, unspecified without coma: Secondary | ICD-10-CM | POA: Diagnosis not present

## 2022-03-16 MED ORDER — GADOBUTROL 1 MMOL/ML IV SOLN
7.0000 mL | Freq: Once | INTRAVENOUS | Status: AC | PRN
  Administered 2022-03-16: 7 mL via INTRAVENOUS

## 2022-03-16 MED ORDER — SPIRONOLACTONE 100 MG PO TABS: 100.0000 mg | ORAL_TABLET | Freq: Every day | ORAL | 0 refills | Status: DC

## 2022-03-17 ENCOUNTER — Other Ambulatory Visit: Payer: Self-pay

## 2022-03-17 DIAGNOSIS — K7031 Alcoholic cirrhosis of liver with ascites: Secondary | ICD-10-CM

## 2022-03-17 DIAGNOSIS — I10 Essential (primary) hypertension: Secondary | ICD-10-CM

## 2022-03-17 DIAGNOSIS — K760 Fatty (change of) liver, not elsewhere classified: Secondary | ICD-10-CM

## 2022-03-17 DIAGNOSIS — R188 Other ascites: Secondary | ICD-10-CM

## 2022-03-23 DIAGNOSIS — K760 Fatty (change of) liver, not elsewhere classified: Secondary | ICD-10-CM | POA: Diagnosis not present

## 2022-03-23 DIAGNOSIS — I10 Essential (primary) hypertension: Secondary | ICD-10-CM | POA: Diagnosis not present

## 2022-03-23 DIAGNOSIS — K7460 Unspecified cirrhosis of liver: Secondary | ICD-10-CM | POA: Diagnosis not present

## 2022-03-23 DIAGNOSIS — R69 Illness, unspecified: Secondary | ICD-10-CM | POA: Diagnosis not present

## 2022-03-23 DIAGNOSIS — R188 Other ascites: Secondary | ICD-10-CM | POA: Diagnosis not present

## 2022-03-24 LAB — BASIC METABOLIC PANEL WITH GFR
BUN/Creatinine Ratio: 9 (calc) (ref 6–22)
BUN: 6 mg/dL — ABNORMAL LOW (ref 7–25)
CO2: 26 mmol/L (ref 20–32)
Calcium: 9.1 mg/dL (ref 8.6–10.4)
Chloride: 98 mmol/L (ref 98–110)
Creat: 0.67 mg/dL (ref 0.50–1.05)
Glucose, Bld: 77 mg/dL (ref 65–99)
Potassium: 3.6 mmol/L (ref 3.5–5.3)
Sodium: 135 mmol/L (ref 135–146)

## 2022-04-02 ENCOUNTER — Telehealth (INDEPENDENT_AMBULATORY_CARE_PROVIDER_SITE_OTHER): Payer: Self-pay

## 2022-04-02 ENCOUNTER — Other Ambulatory Visit (INDEPENDENT_AMBULATORY_CARE_PROVIDER_SITE_OTHER): Payer: Self-pay | Admitting: Internal Medicine

## 2022-04-02 NOTE — Telephone Encounter (Signed)
She has an appointment until January but I think she should be seen earlier.  Can we get her a follow up in September/October? Thanks

## 2022-04-02 NOTE — Telephone Encounter (Signed)
Message sent to Mitzie to get scheduled for a sooner appointment.

## 2022-04-02 NOTE — Telephone Encounter (Signed)
Karla Young,   Needs sooner appointment than January, Per Dr. Levon Hedger see if we can move up to September or October. Thanks   Approved Prescriptions   furosemide (LASIX) 40 MG tablet       Sig: Take 1/2 (one-half) tablet by mouth once daily   Disp:  90 tablet    Refills:  0   Start: 04/02/2022   Class: Normal   Authorized by: Dolores Frame, MD      To be filled at: [facility] [address] [place] - [address]       04/02/2022 - Rx Request: [employer], [employer] and others ([facility]) View All Conversations on this Encounter April 02, 2022       04/02/22  9:54 AM Dolores Frame, MD routed this conversation to Me        04/02/22  9:54 AM  Dolores Frame, MD signed an order  Dolores Frame, MD to Me  Karla Young      04/02/22  9:54 AM Note She has an appointment until January but I think she should be seen earlier.  Can we get her a follow up in September/October? Thanks

## 2022-04-05 ENCOUNTER — Other Ambulatory Visit (INDEPENDENT_AMBULATORY_CARE_PROVIDER_SITE_OTHER): Payer: Self-pay | Admitting: Gastroenterology

## 2022-04-05 DIAGNOSIS — K7031 Alcoholic cirrhosis of liver with ascites: Secondary | ICD-10-CM

## 2022-04-21 ENCOUNTER — Other Ambulatory Visit: Payer: Self-pay | Admitting: Gastroenterology

## 2022-04-21 ENCOUNTER — Telehealth (INDEPENDENT_AMBULATORY_CARE_PROVIDER_SITE_OTHER): Payer: Self-pay | Admitting: *Deleted

## 2022-04-21 DIAGNOSIS — K7031 Alcoholic cirrhosis of liver with ascites: Secondary | ICD-10-CM

## 2022-04-21 MED ORDER — FUROSEMIDE 40 MG PO TABS: 40.0000 mg | ORAL_TABLET | Freq: Every day | ORAL | 1 refills | Status: DC

## 2022-04-21 NOTE — Telephone Encounter (Signed)
Refill for 40 mg dosing sent

## 2022-04-21 NOTE — Telephone Encounter (Signed)
Fax from [facility] [employer] [facility]. Furosemide 40mg  rx is wrote with directions one half daily. Patient told pharmacy she was taking one whole tablet that it was increased recently. It is a result note on MRI on 03/16/22 to increase to one 40mg  one daily. Pharmacy needs new directions so insurance will cover.

## 2022-04-29 DIAGNOSIS — E039 Hypothyroidism, unspecified: Secondary | ICD-10-CM | POA: Diagnosis not present

## 2022-04-29 DIAGNOSIS — R69 Illness, unspecified: Secondary | ICD-10-CM | POA: Diagnosis not present

## 2022-04-29 DIAGNOSIS — K219 Gastro-esophageal reflux disease without esophagitis: Secondary | ICD-10-CM | POA: Diagnosis not present

## 2022-04-29 DIAGNOSIS — R609 Edema, unspecified: Secondary | ICD-10-CM | POA: Diagnosis not present

## 2022-04-29 DIAGNOSIS — Z87891 Personal history of nicotine dependence: Secondary | ICD-10-CM | POA: Diagnosis not present

## 2022-04-29 DIAGNOSIS — Z809 Family history of malignant neoplasm, unspecified: Secondary | ICD-10-CM | POA: Diagnosis not present

## 2022-04-29 DIAGNOSIS — Z8249 Family history of ischemic heart disease and other diseases of the circulatory system: Secondary | ICD-10-CM | POA: Diagnosis not present

## 2022-04-29 DIAGNOSIS — I10 Essential (primary) hypertension: Secondary | ICD-10-CM | POA: Diagnosis not present

## 2022-05-25 ENCOUNTER — Ambulatory Visit (INDEPENDENT_AMBULATORY_CARE_PROVIDER_SITE_OTHER): Payer: 59 | Admitting: Gastroenterology

## 2022-05-25 ENCOUNTER — Encounter (INDEPENDENT_AMBULATORY_CARE_PROVIDER_SITE_OTHER): Payer: Self-pay | Admitting: Gastroenterology

## 2022-05-25 VITALS — BP 120/69 | HR 91 | Temp 98.5°F | Ht 63.0 in | Wt 147.4 lb

## 2022-05-25 DIAGNOSIS — F1029 Alcohol dependence with unspecified alcohol-induced disorder: Secondary | ICD-10-CM | POA: Diagnosis not present

## 2022-05-25 DIAGNOSIS — K7460 Unspecified cirrhosis of liver: Secondary | ICD-10-CM | POA: Diagnosis not present

## 2022-05-25 DIAGNOSIS — R188 Other ascites: Secondary | ICD-10-CM | POA: Diagnosis not present

## 2022-05-25 DIAGNOSIS — R69 Illness, unspecified: Secondary | ICD-10-CM | POA: Diagnosis not present

## 2022-05-25 NOTE — Progress Notes (Unsigned)
Referring Provider: Waldon Reining, MD Primary Care Physician:  Waldon Reining, MD Primary GI Physician: Levon Hedger   Chief Complaint  Patient presents with   Cirrhosis    Patient here today for a follow up on her Acites due to Alcoholic cirrhosis. Per patient Dr. Levon Hedger increased her lasix 40 mg once per day. She is also taking spironolactone 100 mg once per day. Per patient she is not currently having any issues with fluid retention in abdomen.   HPI:   Karla Young is a 61 y.o. female with past medical history of decompensated liver cirrhosis secondary to hepatitis C and alcoholism complicated by ascites and nonbleeding grade 1 esophageal varices, active alcohol abuse, IBS, fibromyalgia, COPD, anxiety, hypertension, hypothyroidism, GERD and IBS  Patient presenting today for follow up of ascites.  Last seen July 2023  MR Abd w wo contrast on august without any masess, large volume ascites, advised to increase furosemide to 40mg  daily and added spironolactone 100mg  daily, reepat BMP 1 week. BMP on 8/15 was WNL with improvement in ascites.   Present:  Doing well on spironolactone and lasix 40mg . She is still drinking 6 pack of beer per day. Denies constipation or diarrhea. Denies changes to skin color or itching. No episodes of confusion.    Last Colonoscopy:  Last Endoscopy:  Recommendations:    Past Medical History:  Diagnosis Date   Anxiety    COPD (chronic obstructive pulmonary disease) (HCC)    Fibromyalgia    GERD (gastroesophageal reflux disease)    Hepatitis C    Hypertension    Hypothyroid    Migraines    MRSA (methicillin resistant staph aureus) culture positive     Past Surgical History:  Procedure Laterality Date   ABDOMINAL HYSTERECTOMY     partial   BACK SURGERY     lumbar removal of disc   BIOPSY  09/06/2014   Procedure: BIOPSY;  Surgeon: 9/15, MD;  Location: [facility];  Service: Endoscopy;;   CHOLECYSTECTOMY     COLONOSCOPY WITH  PROPOFOL N/A 09/06/2014   Procedure: COLONOSCOPY WITH PROPOFOL (Procedure #2) At cecum at 0819; total withdrawal time=13 minutes;  Surgeon: 09/08/2014, MD;  Location: [facility];  Service: Endoscopy;  Laterality: N/A;   ESOPHAGEAL DILATION N/A 09/06/2014   Procedure: ESOPHAGEAL DILATION WITH MALONEY DILATOR 09/08/2014;  Surgeon: Malissa Hippo, MD;  Location: [facility];  Service: Endoscopy;  Laterality: N/A;   ESOPHAGOGASTRODUODENOSCOPY (EGD) WITH PROPOFOL N/A 09/06/2014   Procedure: ESOPHAGOGASTRODUODENOSCOPY (EGD) WITH PROPOFOL (Procedure #1) ;  Surgeon: , MD;  Location: [facility];  Service: Endoscopy;  Laterality: N/A;   ESOPHAGOGASTRODUODENOSCOPY (EGD) WITH PROPOFOL N/A 09/02/2021   Procedure: ESOPHAGOGASTRODUODENOSCOPY (EGD) WITH PROPOFOL;  Surgeon: 09/08/2014, MD;  [facility];  Service: Gastroenterology;  Laterality: N/A;  830   HEMORRHOID SURGERY     Dr Malissa Hippo   PARTIAL HYSTERECTOMY      One ovary left   RECTAL PROLAPSE REPAIR      Current Outpatient Medications  Medication Sig Dispense Refill   albuterol (PROVENTIL) (2.5 MG/3ML) 0.083% nebulizer solution Take 3 mLs (2.5 mg total) by nebulization every 2 (two) hours as needed for wheezing. 75 mL 12   albuterol (VENTOLIN HFA) 108 (90 Base) MCG/ACT inhaler Inhale 2 puffs into the lungs every 6 (six) hours as needed for wheezing or shortness of breath. 1 each 2   furosemide (LASIX) 40 MG tablet Take 1 tablet (40 mg total) by mouth daily. 90  tablet 1   levothyroxine (SYNTHROID) 88 MCG tablet Take 88 mcg by mouth daily before breakfast.     linaclotide (LINZESS) 145 MCG CAPS capsule Take 145 mcg by mouth daily before breakfast.     lisinopril (PRINIVIL,ZESTRIL) 10 MG tablet Take 10 mg by mouth daily.     mometasone-formoterol (DULERA) 200-5 MCG/ACT AERO Inhale 2 puffs into the lungs 2 (two) times daily. 13 g 1   Multiple Vitamin (MULTIVITAMIN WITH MINERALS) TABS tablet Take 1 tablet by mouth daily. 30  tablet 1   omeprazole (PRILOSEC) 40 MG capsule Take 40 mg by mouth daily.     spironolactone (ALDACTONE) 100 MG tablet Take 1 tablet by mouth once daily 90 tablet 1   No current facility-administered medications for this visit.    Allergies as of 05/25/2022   (No Known Allergies)    Family History  Problem Relation Age of Onset   Colon cancer Neg Hx    Colon polyps Neg Hx     Social History   Socioeconomic History   Marital status: Widowed    Spouse name: Not on file   Number of children: Not on file   Years of education: Not on file   Highest education level: Not on file  Occupational History   Not on file  Tobacco Use   Smoking status: Former    Packs/day: 0.25    Years: 30.00    Total pack years: 7.50    Types: Cigarettes   Smokeless tobacco: Never   Tobacco comments:    3-4 cigs a day since age 89.  Vaping Use   Vaping Use: Never used  Substance and Sexual Activity   Alcohol use: Yes    Comment: beer nightly not sure of how many   Drug use: No   Sexual activity: Yes    Birth control/protection: Surgical  Other Topics Concern   Not on file  Social History Narrative   Not on file   Social Determinants of Health   Financial Resource Strain: Not on file  Food Insecurity: Not on file  Transportation Needs: Not on file  Physical Activity: Not on file  Stress: Not on file  Social Connections: Not on file    Review of systems General: negative for malaise, night sweats, fever, chills, weight los Neck: Negative for lumps, goiter, pain and significant neck swelling Resp: Negative for cough, wheezing, dyspnea at rest CV: Negative for chest pain, leg swelling, palpitations, orthopnea GI: denies melena, hematochezia, nausea, vomiting, diarrhea, constipation, dysphagia, odyonophagia, early satiety or unintentional weight loss.  MSK: Negative for joint pain or swelling, back pain, and muscle pain. Derm: Negative for itching or rash Psych: Denies depression,  anxiety, memory loss, confusion. No homicidal or suicidal ideation.  Heme: Negative for prolonged bleeding, bruising easily, and swollen nodes. Endocrine: Negative for cold or heat intolerance, polyuria, polydipsia and goiter. Neuro: negative for tremor, gait imbalance, syncope and seizures. The remainder of the review of systems is noncontributory.  Physical Exam: BP 120/69 (BP Location: Left Arm, Patient Position: Sitting, Cuff Size: Small)   Pulse 91   Temp 98.5 F (36.9 C) (Oral)   Ht 5\' 3"  (1.6 m)   Wt 147 lb 6.4 oz (66.9 kg)   BMI 26.11 kg/m  General:   Alert and oriented. No distress noted. Pleasant and cooperative.  Head:  Normocephalic and atraumatic. Eyes:  Conjuctiva clear without scleral icterus. Mouth:  Oral mucosa pink and moist. Good dentition. No lesions. Heart: Normal rate and rhythm,  s1 and s2 heart sounds present.  Lungs: Clear lung sounds in all lobes. Respirations equal and unlabored. Abdomen:  +BS, soft, non-tender and non-distended. No rebound or guarding. No HSM or masses noted. Derm: No palmar erythema or jaundice Msk:  Symmetrical without gross deformities. Normal posture. Extremities:  Without edema. Neurologic:  Alert and  oriented x4 Psych:  Alert and cooperative. Normal mood and affect.  Invalid input(s): "6 MONTHS"   ASSESSMENT: Karla Young is a 61 y.o. female presenting today    PLAN:  Continue with lasix 40mg  daily and spironolactone 100mg  daily  2.  3.    Follow Up: Has follow up in January   Akari Defelice L. Alver Sorrow, MSN, APRN, AGNP-C Adult-Gerontology Nurse Practitioner [facility]

## 2022-05-25 NOTE — Patient Instructions (Signed)
Please continue lasix 40mg  and spironolactone 100mg  daily. Please keep follow up with Dr. Jenetta Downer in January As we discussed, the most important thing you can do is to stop drinking alcohol completely. I am placing a referral to psychiatry to see if they can evaluate you/possibly provide resources to help you stop drinking.

## 2022-05-26 DIAGNOSIS — F1029 Alcohol dependence with unspecified alcohol-induced disorder: Secondary | ICD-10-CM | POA: Insufficient documentation

## 2022-05-26 DIAGNOSIS — F1010 Alcohol abuse, uncomplicated: Secondary | ICD-10-CM | POA: Insufficient documentation

## 2022-06-08 DIAGNOSIS — Z720 Tobacco use: Secondary | ICD-10-CM | POA: Diagnosis not present

## 2022-06-08 DIAGNOSIS — R188 Other ascites: Secondary | ICD-10-CM | POA: Diagnosis not present

## 2022-06-08 DIAGNOSIS — R011 Cardiac murmur, unspecified: Secondary | ICD-10-CM | POA: Diagnosis not present

## 2022-06-08 DIAGNOSIS — F418 Other specified anxiety disorders: Secondary | ICD-10-CM | POA: Diagnosis not present

## 2022-06-08 DIAGNOSIS — Z79899 Other long term (current) drug therapy: Secondary | ICD-10-CM | POA: Diagnosis not present

## 2022-06-08 DIAGNOSIS — E039 Hypothyroidism, unspecified: Secondary | ICD-10-CM | POA: Diagnosis not present

## 2022-06-08 DIAGNOSIS — Z23 Encounter for immunization: Secondary | ICD-10-CM | POA: Diagnosis not present

## 2022-06-08 DIAGNOSIS — Z87891 Personal history of nicotine dependence: Secondary | ICD-10-CM | POA: Diagnosis not present

## 2022-06-08 DIAGNOSIS — K219 Gastro-esophageal reflux disease without esophagitis: Secondary | ICD-10-CM | POA: Diagnosis not present

## 2022-06-08 DIAGNOSIS — I10 Essential (primary) hypertension: Secondary | ICD-10-CM | POA: Diagnosis not present

## 2022-06-08 DIAGNOSIS — I38 Endocarditis, valve unspecified: Secondary | ICD-10-CM | POA: Diagnosis not present

## 2022-06-08 DIAGNOSIS — J449 Chronic obstructive pulmonary disease, unspecified: Secondary | ICD-10-CM | POA: Diagnosis not present

## 2022-06-30 ENCOUNTER — Ambulatory Visit: Payer: Medicaid Other | Admitting: Gastroenterology

## 2022-07-19 ENCOUNTER — Ambulatory Visit (INDEPENDENT_AMBULATORY_CARE_PROVIDER_SITE_OTHER): Payer: Medicaid Other | Admitting: Internal Medicine

## 2022-07-19 NOTE — Progress Notes (Signed)
Erroneous encounter - please disregard.

## 2022-07-21 ENCOUNTER — Emergency Department (HOSPITAL_COMMUNITY): Payer: 59

## 2022-07-21 ENCOUNTER — Observation Stay (HOSPITAL_COMMUNITY)
Admission: EM | Admit: 2022-07-21 | Discharge: 2022-07-23 | Disposition: A | Discharge status: Home / Self Care | Payer: 59 | Attending: Family Medicine | Admitting: Family Medicine

## 2022-07-21 ENCOUNTER — Other Ambulatory Visit: Payer: Self-pay

## 2022-07-21 ENCOUNTER — Encounter (HOSPITAL_COMMUNITY): Payer: Self-pay | Admitting: Emergency Medicine

## 2022-07-21 DIAGNOSIS — J449 Chronic obstructive pulmonary disease, unspecified: Secondary | ICD-10-CM | POA: Insufficient documentation

## 2022-07-21 DIAGNOSIS — E039 Hypothyroidism, unspecified: Secondary | ICD-10-CM | POA: Diagnosis not present

## 2022-07-21 DIAGNOSIS — N179 Acute kidney failure, unspecified: Secondary | ICD-10-CM | POA: Diagnosis not present

## 2022-07-21 DIAGNOSIS — Z8619 Personal history of other infectious and parasitic diseases: Secondary | ICD-10-CM | POA: Diagnosis present

## 2022-07-21 DIAGNOSIS — Z79899 Other long term (current) drug therapy: Secondary | ICD-10-CM | POA: Insufficient documentation

## 2022-07-21 DIAGNOSIS — D696 Thrombocytopenia, unspecified: Secondary | ICD-10-CM | POA: Diagnosis not present

## 2022-07-21 DIAGNOSIS — K7460 Unspecified cirrhosis of liver: Secondary | ICD-10-CM | POA: Diagnosis present

## 2022-07-21 DIAGNOSIS — F1010 Alcohol abuse, uncomplicated: Secondary | ICD-10-CM | POA: Diagnosis not present

## 2022-07-21 DIAGNOSIS — Z1152 Encounter for screening for COVID-19: Secondary | ICD-10-CM | POA: Insufficient documentation

## 2022-07-21 DIAGNOSIS — R109 Unspecified abdominal pain: Secondary | ICD-10-CM | POA: Diagnosis not present

## 2022-07-21 DIAGNOSIS — R188 Other ascites: Secondary | ICD-10-CM | POA: Diagnosis present

## 2022-07-21 DIAGNOSIS — R69 Illness, unspecified: Secondary | ICD-10-CM | POA: Diagnosis not present

## 2022-07-21 DIAGNOSIS — Z87891 Personal history of nicotine dependence: Secondary | ICD-10-CM | POA: Insufficient documentation

## 2022-07-21 DIAGNOSIS — R059 Cough, unspecified: Secondary | ICD-10-CM | POA: Diagnosis not present

## 2022-07-21 DIAGNOSIS — K7031 Alcoholic cirrhosis of liver with ascites: Secondary | ICD-10-CM | POA: Diagnosis not present

## 2022-07-21 DIAGNOSIS — I10 Essential (primary) hypertension: Secondary | ICD-10-CM | POA: Diagnosis not present

## 2022-07-21 DIAGNOSIS — R0602 Shortness of breath: Secondary | ICD-10-CM | POA: Diagnosis not present

## 2022-07-21 LAB — COMPREHENSIVE METABOLIC PANEL
ALT: 38 U/L (ref 0–44)
AST: 71 U/L — ABNORMAL HIGH (ref 15–41)
Albumin: 2.4 g/dL — ABNORMAL LOW (ref 3.5–5.0)
Alkaline Phosphatase: 81 U/L (ref 38–126)
Anion gap: 9 (ref 5–15)
BUN: 26 mg/dL — ABNORMAL HIGH (ref 8–23)
CO2: 28 mmol/L (ref 22–32)
Calcium: 9.9 mg/dL (ref 8.9–10.3)
Chloride: 96 mmol/L — ABNORMAL LOW (ref 98–111)
Creatinine, Ser: 1.7 mg/dL — ABNORMAL HIGH (ref 0.44–1.00)
GFR, Estimated: 34 mL/min — ABNORMAL LOW (ref >60)
Glucose, Bld: 138 mg/dL — ABNORMAL HIGH (ref 70–99)
Potassium: 3.5 mmol/L (ref 3.5–5.1)
Sodium: 133 mmol/L — ABNORMAL LOW (ref 135–145)
Total Bilirubin: 6.1 mg/dL — ABNORMAL HIGH (ref 0.3–1.2)
Total Protein: 6.7 g/dL (ref 6.5–8.1)

## 2022-07-21 LAB — RESP PANEL BY RT-PCR (RSV, FLU A&B, COVID)  RVPGX2
Influenza A by PCR: NEGATIVE
Influenza B by PCR: NEGATIVE
Resp Syncytial Virus by PCR: NEGATIVE
SARS Coronavirus 2 by RT PCR: NEGATIVE

## 2022-07-21 LAB — PROTIME-INR
INR: 2.1 — ABNORMAL HIGH (ref 0.8–1.2)
Prothrombin Time: 23.4 seconds — ABNORMAL HIGH (ref 11.4–15.2)

## 2022-07-21 LAB — APTT: aPTT: 47 seconds — ABNORMAL HIGH (ref 24–36)

## 2022-07-21 LAB — AMMONIA: Ammonia: 47 umol/L — ABNORMAL HIGH (ref 9–35)

## 2022-07-21 LAB — PHOSPHORUS: Phosphorus: 3.7 mg/dL (ref 2.5–4.6)

## 2022-07-21 LAB — URINALYSIS, ROUTINE W REFLEX MICROSCOPIC
Bilirubin Urine: NEGATIVE
Glucose, UA: NEGATIVE mg/dL
Hgb urine dipstick: NEGATIVE
Ketones, ur: NEGATIVE mg/dL
Leukocytes,Ua: NEGATIVE
Nitrite: NEGATIVE
Protein, ur: NEGATIVE mg/dL
Specific Gravity, Urine: 1.016 (ref 1.005–1.030)
pH: 5 (ref 5.0–8.0)

## 2022-07-21 LAB — CBC
HCT: 31.9 % — ABNORMAL LOW (ref 36.0–46.0)
Hemoglobin: 10.4 g/dL — ABNORMAL LOW (ref 12.0–15.0)
MCH: 32.7 pg (ref 26.0–34.0)
MCHC: 32.6 g/dL (ref 30.0–36.0)
MCV: 100.3 fL — ABNORMAL HIGH (ref 80.0–100.0)
Platelets: 71 10*3/uL — ABNORMAL LOW (ref 150–400)
RBC: 3.18 MIL/uL — ABNORMAL LOW (ref 3.87–5.11)
RDW: 14.9 % (ref 11.5–15.5)
WBC: 6.6 10*3/uL (ref 4.0–10.5)
nRBC: 0 % (ref 0.0–0.2)

## 2022-07-21 LAB — BILIRUBIN, DIRECT: Bilirubin, Direct: 2 mg/dL — ABNORMAL HIGH (ref 0.0–0.2)

## 2022-07-21 LAB — MAGNESIUM: Magnesium: 1.8 mg/dL (ref 1.7–2.4)

## 2022-07-21 LAB — BRAIN NATRIURETIC PEPTIDE: B Natriuretic Peptide: 75 pg/mL (ref 0.0–100.0)

## 2022-07-21 MED ORDER — LORAZEPAM 2 MG/ML IJ SOLN: 1.0000 mg | INTRAMUSCULAR | Status: DC | PRN

## 2022-07-21 MED ORDER — SODIUM CHLORIDE 0.9 % IV SOLN
2.0000 g | INTRAVENOUS | Status: DC
  Administered 2022-07-22: 2 g via INTRAVENOUS
  Filled 2022-07-21: qty 20

## 2022-07-21 MED ORDER — ALBUTEROL SULFATE (2.5 MG/3ML) 0.083% IN NEBU: 2.5000 mg | INHALATION_SOLUTION | RESPIRATORY_TRACT | Status: DC | PRN

## 2022-07-21 MED ORDER — ONDANSETRON HCL 4 MG/2ML IJ SOLN: 4.0000 mg | Freq: Four times a day (QID) | INTRAMUSCULAR | Status: DC | PRN

## 2022-07-21 MED ORDER — FOLIC ACID 1 MG PO TABS
1.0000 mg | ORAL_TABLET | Freq: Every day | ORAL | Status: DC
  Administered 2022-07-21 – 2022-07-23 (×3): 1 mg via ORAL
  Filled 2022-07-21 (×3): qty 1

## 2022-07-21 MED ORDER — POTASSIUM CHLORIDE CRYS ER 20 MEQ PO TBCR
40.0000 meq | EXTENDED_RELEASE_TABLET | Freq: Once | ORAL | Status: AC
  Administered 2022-07-21: 40 meq via ORAL
  Filled 2022-07-21: qty 2

## 2022-07-21 MED ORDER — FUROSEMIDE 10 MG/ML IJ SOLN
40.0000 mg | Freq: Once | INTRAMUSCULAR | Status: AC
  Administered 2022-07-21: 40 mg via INTRAVENOUS
  Filled 2022-07-21: qty 4

## 2022-07-21 MED ORDER — LORAZEPAM 1 MG PO TABS: 1.0000 mg | ORAL_TABLET | ORAL | Status: DC | PRN

## 2022-07-21 MED ORDER — POLYETHYLENE GLYCOL 3350 17 G PO PACK: 17.0000 g | PACK | Freq: Every day | ORAL | Status: DC | PRN

## 2022-07-21 MED ORDER — THIAMINE HCL 100 MG/ML IJ SOLN
100.0000 mg | Freq: Every day | INTRAMUSCULAR | Status: DC
  Filled 2022-07-21: qty 2

## 2022-07-21 MED ORDER — THIAMINE MONONITRATE 100 MG PO TABS
100.0000 mg | ORAL_TABLET | Freq: Every day | ORAL | Status: DC
  Administered 2022-07-21 – 2022-07-23 (×3): 100 mg via ORAL
  Filled 2022-07-21 (×3): qty 1

## 2022-07-21 MED ORDER — SODIUM CHLORIDE 0.9 % IV SOLN
2.0000 g | Freq: Once | INTRAVENOUS | Status: AC
  Administered 2022-07-21: 2 g via INTRAVENOUS
  Filled 2022-07-21: qty 20

## 2022-07-21 MED ORDER — IPRATROPIUM-ALBUTEROL 0.5-2.5 (3) MG/3ML IN SOLN
3.0000 mL | Freq: Once | RESPIRATORY_TRACT | Status: AC
  Administered 2022-07-21: 3 mL via RESPIRATORY_TRACT
  Filled 2022-07-21: qty 3

## 2022-07-21 MED ORDER — ONDANSETRON HCL 4 MG PO TABS: 4.0000 mg | ORAL_TABLET | Freq: Four times a day (QID) | ORAL | Status: DC | PRN

## 2022-07-21 MED ORDER — PANTOPRAZOLE SODIUM 40 MG PO TBEC
40.0000 mg | DELAYED_RELEASE_TABLET | Freq: Every day | ORAL | Status: DC
  Administered 2022-07-22 – 2022-07-23 (×2): 40 mg via ORAL
  Filled 2022-07-21 (×2): qty 1

## 2022-07-21 MED ORDER — HYDROXYZINE HCL 25 MG PO TABS: 25.0000 mg | ORAL_TABLET | Freq: Three times a day (TID) | ORAL | Status: DC | PRN

## 2022-07-21 MED ORDER — LEVOTHYROXINE SODIUM 88 MCG PO TABS
88.0000 ug | ORAL_TABLET | Freq: Every day | ORAL | Status: DC
  Administered 2022-07-22 – 2022-07-23 (×2): 88 ug via ORAL
  Filled 2022-07-21 (×2): qty 1

## 2022-07-21 MED ORDER — ALBUMIN HUMAN 25 % IV SOLN
12.5000 g | Freq: Once | INTRAVENOUS | Status: AC
  Administered 2022-07-21: 12.5 g via INTRAVENOUS
  Filled 2022-07-21 (×2): qty 50

## 2022-07-21 MED ORDER — ADULT MULTIVITAMIN W/MINERALS CH
1.0000 | ORAL_TABLET | Freq: Every day | ORAL | Status: DC
  Administered 2022-07-21 – 2022-07-23 (×3): 1 via ORAL
  Filled 2022-07-21 (×3): qty 1

## 2022-07-21 NOTE — Assessment & Plan Note (Addendum)
Alcoholic liver cirrhosis with ascites.  Abdomen distended.  Has not had paracentesis in the past.  Tmax 99.1.  WBC 6.6.  Influenza and COVID test negative.  INR - 2.1.  With thrombocytopenia of 71.  T. bili 6.1. -Ultrasound-guided paracentesis in a.m. - Ascitic Fluid analysis -Continue 2 g ceftriaxone daily for now -Blood pressure soft, albumin 12.5 g, and Lasix 40 mg x 1 given in ED. - Hold off on further diuretics at this time.

## 2022-07-21 NOTE — Assessment & Plan Note (Signed)
2/2 alcoholic liver cirrhosis. - Trend

## 2022-07-21 NOTE — ED Triage Notes (Signed)
Pt c/o cough x 2 months. Sob x 2 weeks. Sob worse with any activity. Pt had some labored breathing with walking to triage room. Abd distended and mildly firm. Pt hx of cirrhosis.

## 2022-07-21 NOTE — Assessment & Plan Note (Signed)
Stable.  Resume home regimen 

## 2022-07-21 NOTE — ED Provider Notes (Signed)
[facility] EMERGENCY DEPARTMENT Provider Note   CSN: [id number] Arrival date & time: 07/21/22  1351     History  Chief Complaint  Patient presents with   Shortness of Breath   Cough   Ascites    Karla Young is a 61 y.o. female.   Shortness of Breath Associated symptoms: cough   Cough Associated symptoms: shortness of breath     61 year old female presents emergency department with complaints of abdominal swelling/discomfort, feelings of shortness of breath, cough.  Patient has history of cirrhosis with recurrent ascites of which she states is similar.  She is reports all symptoms beginning approximately this past Friday with worsening since onset.  She reports compliance with her home medicines, diet as well as salt restriction.  Denies any fever, chills, night sweats, chest pain, urinary/vaginal symptoms, change in bowel habits.  Past medical history significant for fibromyalgia, GERD, COPD, hypertension, hypothyroidism, cirrhosis of liver, ascites  Home Medications Prior to Admission medications   Medication Sig Start Date End Date Taking? Authorizing Provider  albuterol (PROVENTIL) (2.5 MG/3ML) 0.083% nebulizer solution Take 3 mLs (2.5 mg total) by nebulization every 2 (two) hours as needed for wheezing. 08/06/21  Yes Emokpae, Courage, MD  albuterol (VENTOLIN HFA) 108 (90 Base) MCG/ACT inhaler Inhale 2 puffs into the lungs every 6 (six) hours as needed for wheezing or shortness of breath. 08/06/21  Yes Emokpae, Courage, MD  furosemide (LASIX) 40 MG tablet Take 1 tablet (40 mg total) by mouth daily. 04/21/22  Yes Dolores Frame, MD  hydrOXYzine (ATARAX) 25 MG tablet Take 25 mg by mouth every 8 (eight) hours as needed. 07/04/22  Yes [provider]  levothyroxine (SYNTHROID) 88 MCG tablet Take 88 mcg by mouth daily before breakfast. 06/12/20  Yes [provider]  linaclotide (LINZESS) 145 MCG CAPS capsule Take 145 mcg by mouth daily before breakfast.    Yes [provider]  lisinopril (ZESTRIL) 5 MG tablet Take 5 mg by mouth daily.   Yes [provider]  Multiple Vitamin (MULTIVITAMIN WITH MINERALS) TABS tablet Take 1 tablet by mouth daily. 08/07/21  Yes Emokpae, Courage, MD  omeprazole (PRILOSEC) 40 MG capsule Take 40 mg by mouth daily.   Yes [provider]  sertraline (ZOLOFT) 50 MG tablet Take 50 mg by mouth daily. 07/04/22  Yes [provider]  spironolactone (ALDACTONE) 100 MG tablet Take 1 tablet by mouth once daily 04/05/22  Yes Marguerita Merles, Reuel Boom, MD  ALPRAZolam Prudy Feeler) 0.5 MG tablet Take 0.5 mg by mouth 2 (two) times daily as needed. Patient not taking: Reported on 07/21/2022 02/25/22   [provider]  lisinopril (PRINIVIL,ZESTRIL) 10 MG tablet Take 10 mg by mouth daily. Patient not taking: Reported on 07/21/2022    [provider]      Allergies    Patient has no known allergies.    Review of Systems   Review of Systems  Respiratory:  Positive for cough and shortness of breath.   All other systems reviewed and are negative.   Physical Exam Updated Vital Signs BP 96/60 (BP Location: Right Arm)   Pulse 80   Temp 98.3 F (36.8 C) (Oral)   Resp 16   Wt 74.6 kg   SpO2 100%   BMI 29.13 kg/m  Physical Exam Vitals and nursing note reviewed.  Constitutional:      General: She is not in acute distress.    Appearance: She is well-developed.     Comments: Yellowing of the skin  noted.  HENT:     Head: Normocephalic and atraumatic.  Eyes:     General: Scleral icterus present.     Conjunctiva/sclera: Conjunctivae normal.  Cardiovascular:     Rate and Rhythm: Normal rate and regular rhythm.     Heart sounds: No murmur heard. Pulmonary:     Effort: Pulmonary effort is normal. No respiratory distress.     Breath sounds: Normal breath sounds. No wheezing, rhonchi or rales.  Abdominal:     Comments: Diffuse abdominal distention.  Palpable liver and right upper  quadrant extending into the abdomen.  No obvious focal point of tenderness.  Musculoskeletal:        General: No swelling.     Cervical back: Neck supple.     Comments: 1+ bilateral lower extremity pitting edema noted.  Skin:    General: Skin is warm and dry.     Capillary Refill: Capillary refill takes less than 2 seconds.  Neurological:     Mental Status: She is alert.  Psychiatric:        Mood and Affect: Mood normal.     ED Results / Procedures / Treatments   Labs (all labs ordered are listed, but only abnormal results are displayed) Labs Reviewed  COMPREHENSIVE METABOLIC PANEL - Abnormal; Notable for the following components:      Result Value   Sodium 133 (*)    Chloride 96 (*)    Glucose, Bld 138 (*)    BUN 26 (*)    Creatinine, Ser 1.70 (*)    Albumin 2.4 (*)    AST 71 (*)    Total Bilirubin 6.1 (*)    GFR, Estimated 34 (*)    All other components within normal limits  CBC - Abnormal; Notable for the following components:   RBC 3.18 (*)    Hemoglobin 10.4 (*)    HCT 31.9 (*)    MCV 100.3 (*)    Platelets 71 (*)    All other components within normal limits  URINALYSIS, ROUTINE W REFLEX MICROSCOPIC - Abnormal; Notable for the following components:   Color, Urine AMBER (*)    All other components within normal limits  PROTIME-INR - Abnormal; Notable for the following components:   Prothrombin Time 23.4 (*)    INR 2.1 (*)    All other components within normal limits  APTT - Abnormal; Notable for the following components:   aPTT 47 (*)    All other components within normal limits  BILIRUBIN, DIRECT - Abnormal; Notable for the following components:   Bilirubin, Direct 2.0 (*)    All other components within normal limits  AMMONIA - Abnormal; Notable for the following components:   Ammonia 47 (*)    All other components within normal limits  BASIC METABOLIC PANEL - Abnormal; Notable for the following components:   Glucose, Bld 108 (*)    BUN 28 (*)     Creatinine, Ser 1.67 (*)    GFR, Estimated 35 (*)    All other components within normal limits  CBC - Abnormal; Notable for the following components:   RBC 2.67 (*)    Hemoglobin 8.9 (*)    HCT 26.7 (*)    Platelets 65 (*)    All other components within normal limits  BODY FLUID CELL COUNT WITH DIFFERENTIAL - Abnormal; Notable for the following components:   Color, Fluid YELLOW (*)    Monocyte-Macrophage-Serous Fluid 41 (*)    All other components within normal limits  LACTATE  DEHYDROGENASE, PLEURAL OR PERITONEAL FLUID - Abnormal; Notable for the following components:   LD, Fluid 28 (*)    All other components within normal limits  RESP PANEL BY RT-PCR (RSV, FLU A&B, COVID)  RVPGX2  CULTURE, BLOOD (ROUTINE X 2)  CULTURE, BLOOD (ROUTINE X 2)  GRAM STAIN  CULTURE, BODY FLUID W GRAM STAIN -BOTTLE  BRAIN NATRIURETIC PEPTIDE  MAGNESIUM  PHOSPHORUS  GLUCOSE, PLEURAL OR PERITONEAL FLUID  PROTEIN, PLEURAL OR PERITONEAL FLUID  PH, BODY FLUID  CYTOLOGY - NON PAP    EKG EKG Interpretation  Date/Time:  Wednesday July 21 2022 17:51:27 EST Ventricular Rate:  80 PR Interval:  167 QRS Duration: 94 QT Interval:  417 QTC Calculation: 482 R Axis:   58 Text Interpretation: Sinus rhythm Atrial premature complexes Low voltage, precordial leads No significant change since last tracing Confirmed by Lorre NickAllen, Anthony ([id number]) on 07/22/2022 9:25:22 AM  Radiology US Paracentesis  Result Date: 07/22/2022 INDICATION: Cirrhosis, ascites EXAM: ULTRASOUND GUIDED RIGHT LOWER QUADRANT PARACENTESIS MEDICATIONS: None. COMPLICATIONS: None immediate. PROCEDURE: Informed written consent was obtained from the patient after a discussion of the risks, benefits and alternatives to treatment. A timeout was performed prior to the initiation of the procedure. Initial ultrasound scanning demonstrates a large amount of ascites within the right lower abdominal quadrant. The right lower abdomen was prepped and draped in  the usual sterile fashion. 1% lidocaine was used for local anesthesia. Following this, a 6 Fr Safe-T-Centesis catheter was introduced. An ultrasound image was saved for documentation purposes. The paracentesis was performed. The catheter was removed and a dressing was applied. The patient tolerated the procedure well without immediate post procedural complication. FINDINGS: A total of approximately 4L of ascitic fluid was removed. Samples were sent to the laboratory as requested by the clinical team. IMPRESSION: Successful ultrasound-guided paracentesis yielding 4 liters of peritoneal fluid. PLAN: If the patient eventually requires >/=2 paracenteses in a 30 day period, candidacy for formal evaluation by [facility] will be assessed. Electronically Signed   By: Ulyses SouthwardMark  Boles M.D.   On: 07/22/2022 13:00   DG Chest 2 View  Result Date: 07/21/2022 CLINICAL DATA:  Cough for 2 months. Shortness of breath for 2 weeks. EXAM: CHEST - 2 VIEW COMPARISON:  09/06/2007. FINDINGS: Trachea is midline. Heart size normal. No airspace consolidation or pleural fluid. Visualized abdomen is grossly unremarkable. IMPRESSION: No acute findings. Electronically Signed   By: Leanna BattlesMelinda  Blietz M.D.   On: 07/21/2022 14:57    Procedures Procedures    Medications Ordered in ED Medications  polyethylene glycol (MIRALAX / GLYCOLAX) packet 17 g (has no administration in time range)  ondansetron (ZOFRAN) tablet 4 mg (has no administration in time range)    Or  ondansetron (ZOFRAN) injection 4 mg (has no administration in time range)  LORazepam (ATIVAN) tablet 1-4 mg (has no administration in time range)    Or  LORazepam (ATIVAN) injection 1-4 mg (has no administration in time range)  thiamine (VITAMIN B1) tablet 100 mg (100 mg Oral Given 07/22/22 1057)    Or  thiamine (VITAMIN B1) injection 100 mg ( Intravenous See Alternative 07/22/22 1057)  folic acid (FOLVITE) tablet 1 mg (1  mg Oral Given 07/22/22 1057)  multivitamin with minerals tablet 1 tablet (1 tablet Oral Given 07/22/22 1057)  cefTRIAXone (ROCEPHIN) 2 g in sodium chloride 0.9 % 100 mL IVPB (has no administration in time range)  albuterol (PROVENTIL) (2.5 MG/3ML) 0.083% nebulizer solution 2.5 mg (has no administration  in time range)  hydrOXYzine (ATARAX) tablet 25 mg (has no administration in time range)  levothyroxine (SYNTHROID) tablet 88 mcg (88 mcg Oral Given by Other 07/22/22 0524)  pantoprazole (PROTONIX) EC tablet 40 mg (40 mg Oral Given 07/22/22 1057)  dextromethorphan-guaiFENesin (MUCINEX DM) 30-600 MG per 12 hr tablet 1 tablet (has no administration in time range)  ipratropium-albuterol (DUONEB) 0.5-2.5 (3) MG/3ML nebulizer solution 3 mL (3 mLs Nebulization Given 07/21/22 1816)  furosemide (LASIX) injection 40 mg (40 mg Intravenous Given 07/21/22 1841)  albumin human 25 % solution 12.5 g (12.5 g Intravenous New Bag/Given 07/21/22 1957)  cefTRIAXone (ROCEPHIN) 2 g in sodium chloride 0.9 % 100 mL IVPB (0 g Intravenous Stopped 07/21/22 2001)  potassium chloride SA (KLOR-CON M) CR tablet 40 mEq (40 mEq Oral Given 07/21/22 2200)    ED Course/ Medical Decision Making/ A&P Clinical Course as of 07/22/22 1548  Wed Jul 21, 2022  1921 WBC: 6.6 [VB]    Clinical Course User Index [VB] Mardene Sayer, MD                           Medical Decision Making Amount and/or Complexity of Data Reviewed Labs: ordered. Decision-making details documented in ED Course. Radiology: ordered.  Risk Prescription drug management. Decision regarding hospitalization.   This patient presents to the ED for concern of abdominal distention/shortness of breath, this involves an extensive number of treatment options, and is a complaint that carries with it a high risk of complications and morbidity.  The differential diagnosis includes ascites, CHF exacerbation, ACS, pulmonary embolism, pneumothorax, aortic dissection,  SBO/LBO, volvulus, peritonitis, anemia, COVID, influenza   Co morbidities that complicate the patient evaluation  See HPI   Additional history obtained:  Additional history obtained from EMR External records from outside source obtained and reviewed including hospital records   Lab Tests:  I Ordered, and personally interpreted labs.  The pertinent results include: Respiratory viral panel negative.  aPTT elevated at 47.  PT and INR elevated at 23.4 and 2.1 respectively.  No leukocytosis.  Evidence of anemia with a hemoglobin of 10.4 which seems to be patient's baseline.  Thrombocytopenia with platelets of 71 of which seems to be near patient's baseline as well.  Mild hyponatremia and hyperchloremia with sodium of 133 and chloride 96.  Patient with evidence of AKI with GFR 34, creatinine 1.7 and BUN of 26.  Elevation of total bilirubin of 6.1 as well as direct bili of 2.0.,  Albumin level of 2.4.  Cultures pending.  UA without abnormalities.  Ammonia pending.   Imaging Studies ordered:  N/a   Cardiac Monitoring: / EKG:  The patient was maintained on a cardiac monitor.  I personally viewed and interpreted the cardiac monitored which showed an underlying rhythm of: Sinus rhythm   Consultations Obtained:  I requested consultation with the attending physician Dr. Elpidio Anis who is doing with treatment plan going forward.   Problem List / ED Course / Critical interventions / Medication management  Ascites I ordered medication including Rocephin for empiric treatment of SBP, Lasix for patient's ascites, DuoNeb for patient's wheeze, albumin for patient's hypoalbuminemia   Reevaluation of the patient after these medicines showed that the patient improved I have reviewed the patients home medicines and have made adjustments as needed   Social Determinants of Health:  History of chronic alcohol use but does not drink currently.  Former cigarette use.   Test / Admission -  Considered:  Alcoholic cirrhosis of liver with ascites/AKI Vitals signs significant for mildly hypotensive developed responded to diuresis.. Otherwise within normal range and stable throughout visit. Laboratory/imaging studies significant for: See above Patient deemed to meet admission criteria given evidence of ascites with diffuse abdominal tenderness for possible paracentesis..  Some concern for SBP although patient without white count and afebrile at this time.  Discussed patient care with attending physician as indicated above.   Treatment plan were discussed at length with patient and they knowledge understanding was agreeable to said plan.  Awaiting consultation, upon shift change.  Shift change, patient care handed off to PA-C Burgess Amor        Final Clinical Impression(s) / ED Diagnoses Final diagnoses:  Alcoholic cirrhosis of liver with ascites (HCC)  AKI (acute kidney injury) [facility])    Rx / DC Orders ED Discharge Orders     None         Peter Garter, [place] 07/22/22 1548    Mardene Sayer, MD 07/26/22 1627

## 2022-07-21 NOTE — Assessment & Plan Note (Signed)
Reports she quit drinking alcohol 3 weeks ago.  She plans to quit drinking alcohol permanently. -CIWA as needed  - Thiamine, folate, multivitamins -Check magnesium and Phos -Cessation encouraged

## 2022-07-21 NOTE — Assessment & Plan Note (Addendum)
Hypotensive, systolic down to 97. -Hold lisinopril 5 mg, Lasix 40 mg and spironolactone 100 for now

## 2022-07-21 NOTE — ED Notes (Signed)
Pt ambulated to restroom with one person assist did obtain a ua its at bedside . Karla Young

## 2022-07-21 NOTE — Assessment & Plan Note (Signed)
Creatinine elevated at 1.7, baseline 0.5-0.7. ?  Secondary to ascites, versus hypotension versus hepato-renal syndrome vs diuretics. -Trend creatinine -Hold lisinopril, Lasix and spironolactone

## 2022-07-21 NOTE — H&P (Signed)
History and Physical    Karla Young GYJ:[id number] DOB: 01-Mar-1961 DOA: 07/21/2022  PCP: Waldon Reining, MD   Patient coming from: Home  I have personally briefly reviewed patient's old medical records in [facility]  Chief Complaint: Abdominal Pain  HPI: Karla Young is a 61 y.o. female with medical history significant for alcoholic liver cirrhosis, COPD, hypertension, IBS, treated hepatitis C. Patient presented to the ED with complaints of difficulty breathing with activity, and cough over the past 2 months.  Abdomen has been swollen for about a year, but over the past 6 days size of her abdomen has doubled. Abdomen feels tight but without pain.ght supple without no significant lower extremity swelling.  No chest pain.  She has never had paracentesis in the past.  No fevers no chills.  ED Course: Blood pressure systolic 95-111.  Tmax 99.1.  O2 sats 93 to 100%.  Heart rate 80s. Creatinine elevated 1.7.  INR 2.1.  Chest x-ray clear. IV ceftriaxone started for possible SBP.  Hospitalist admit for alcoholic liver cirrhosis with ascites  Review of Systems: As per HPI all other systems reviewed and negative.  Past Medical History:  Diagnosis Date   Anxiety    COPD (chronic obstructive pulmonary disease) (HCC)    Fibromyalgia    GERD (gastroesophageal reflux disease)    Hepatitis C    Hypertension    Hypothyroid    Migraines    MRSA (methicillin resistant staph aureus) culture positive     Past Surgical History:  Procedure Laterality Date   ABDOMINAL HYSTERECTOMY     partial   BACK SURGERY     lumbar removal of disc   BIOPSY  09/06/2014   Procedure: BIOPSY;  Surgeon: Malissa Hippo, MD;  Location: [facility];  Service: Endoscopy;;   CHOLECYSTECTOMY     COLONOSCOPY WITH PROPOFOL N/A 09/06/2014   Procedure: COLONOSCOPY WITH PROPOFOL (Procedure #2) At cecum at 0819; total withdrawal time=13 minutes;  Surgeon: Malissa Hippo, MD;  Location: [facility];  Service: Endoscopy;   Laterality: N/A;   ESOPHAGEAL DILATION N/A 09/06/2014   Procedure: ESOPHAGEAL DILATION WITH MALONEY DILATOR ;  Surgeon: Malissa Hippo, MD;  Location: [facility];  Service: Endoscopy;  Laterality: N/A;   ESOPHAGOGASTRODUODENOSCOPY (EGD) WITH PROPOFOL N/A 09/06/2014   Procedure: ESOPHAGOGASTRODUODENOSCOPY (EGD) WITH PROPOFOL (Procedure #1) ;  Surgeon: Malissa Hippo, MD;  Location: [facility];  Service: Endoscopy;  Laterality: N/A;   ESOPHAGOGASTRODUODENOSCOPY (EGD) WITH PROPOFOL N/A 09/02/2021   Procedure: ESOPHAGOGASTRODUODENOSCOPY (EGD) WITH PROPOFOL;  Surgeon: Dolores Frame, MD;  Location: [facility];  Service: Gastroenterology;  Laterality: N/A;  830   HEMORRHOID SURGERY     Dr Vicenta Aly   PARTIAL HYSTERECTOMY      One ovary left   RECTAL PROLAPSE REPAIR       reports that she has quit smoking. Her smoking use included cigarettes. She has a 7.50 pack-year smoking history. She has never used smokeless tobacco. She reports that she does not currently use alcohol. She reports that she does not use drugs.  No Known Allergies  Family History  Problem Relation Age of Onset   Colon cancer Neg Hx    Colon polyps Neg Hx     Prior to Admission medications   Medication Sig Start Date End Date Taking? Authorizing Provider  albuterol (PROVENTIL) (2.5 MG/3ML) 0.083% nebulizer solution Take 3 mLs (2.5 mg total) by nebulization every 2 (two) hours as needed for wheezing. 08/06/21  Yes Shon Hale, MD  albuterol (VENTOLIN HFA) 108 (90 Base) MCG/ACT inhaler Inhale 2 puffs into the lungs every 6 (six) hours as needed for wheezing or shortness of breath. 08/06/21  Yes Kendricks Reap, Courage, MD  furosemide (LASIX) 40 MG tablet Take 1 tablet (40 mg total) by mouth daily. 04/21/22  Yes Dolores Frame, MD  hydrOXYzine (ATARAX) 25 MG tablet Take 25 mg by mouth every 8 (eight) hours as needed. 07/04/22  Yes [provider]  levothyroxine (SYNTHROID) 88 MCG tablet Take 88 mcg  by mouth daily before breakfast. 06/12/20  Yes [provider]  linaclotide (LINZESS) 145 MCG CAPS capsule Take 145 mcg by mouth daily before breakfast.   Yes [provider]  lisinopril (ZESTRIL) 5 MG tablet Take 5 mg by mouth daily.   Yes [provider]  Multiple Vitamin (MULTIVITAMIN WITH MINERALS) TABS tablet Take 1 tablet by mouth daily. 08/07/21  Yes Nareg Breighner, Courage, MD  omeprazole (PRILOSEC) 40 MG capsule Take 40 mg by mouth daily.   Yes [provider]  sertraline (ZOLOFT) 50 MG tablet Take 50 mg by mouth daily. 07/04/22  Yes [provider]  spironolactone (ALDACTONE) 100 MG tablet Take 1 tablet by mouth once daily 04/05/22  Yes Marguerita Merles, Reuel Boom, MD  ALPRAZolam Prudy Feeler) 0.5 MG tablet Take 0.5 mg by mouth 2 (two) times daily as needed. Patient not taking: Reported on 07/21/2022 02/25/22   [provider]  lisinopril (PRINIVIL,ZESTRIL) 10 MG tablet Take 10 mg by mouth daily. Patient not taking: Reported on 07/21/2022    [provider]    Physical Exam: Vitals:   07/21/22 1800 07/21/22 1816 07/21/22 1845 07/21/22 1940  BP: (!) 97/55  (!) 111/58 108/73  Pulse: 83  85 84  Resp: 15  15 18   Temp:      TempSrc:      SpO2: 98% 100% 94% 96%    Constitutional: NAD, calm, comfortable Vitals:   07/21/22 1800 07/21/22 1816 07/21/22 1845 07/21/22 1940  BP: (!) 97/55  (!) 111/58 108/73  Pulse: 83  85 84  Resp: 15  15 18   Temp:      TempSrc:      SpO2: 98% 100% 94% 96%   Eyes: PERRL, lids and conjunctivae normal ENMT: Mucous membranes are moist.  Neck: normal, supple, no masses, no thyromegaly Respiratory: clear to auscultation bilaterally, no wheezing, no crackles. Normal respiratory effort. No accessory muscle use.  Cardiovascular: Regular rate and rhythm, no murmurs / rubs / gallops. No extremity edema.  Extremities warm.  Abdomen: Abdomen distended, tight, mild diffuse tenderness, no masses palpated. No  hepatosplenomegaly. Bowel sounds positive.  Musculoskeletal: no clubbing / cyanosis. No joint deformity upper and lower extremities.   Skin: no rashes, lesions, ulcers. No induration Neurologic: No apparent cranial nerve abnormality, moving extremities spontaneously. Psychiatric: Normal judgment and insight. Alert and oriented x 3. Normal mood.   Labs on Admission: I have personally reviewed following labs and imaging studies  CBC: Recent Labs  Lab 07/21/22 1456  WBC 6.6  HGB 10.4*  HCT 31.9*  MCV 100.3*  PLT 71*   Basic Metabolic Panel: Recent Labs  Lab 07/21/22 1456  NA 133*  K 3.5  CL 96*  CO2 28  GLUCOSE 138*  BUN 26*  CREATININE 1.70*  CALCIUM 9.9   GFR: CrCl cannot be calculated (Unknown ideal weight.). Liver Function Tests: Recent Labs  Lab 07/21/22 1456  AST 71*  ALT 38  ALKPHOS 81  BILITOT 6.1*  PROT 6.7  ALBUMIN 2.4*  Coagulation Profile: Recent Labs  Lab 07/21/22 1455  INR 2.1*   Urine analysis:    Component Value Date/Time   COLORURINE AMBER (A) 07/21/2022 1905   APPEARANCEUR CLEAR 07/21/2022 1905   LABSPEC 1.016 07/21/2022 1905   PHURINE 5.0 07/21/2022 1905   GLUCOSEU NEGATIVE 07/21/2022 1905   HGBUR NEGATIVE 07/21/2022 1905   BILIRUBINUR NEGATIVE 07/21/2022 1905   KETONESUR NEGATIVE 07/21/2022 1905   PROTEINUR NEGATIVE 07/21/2022 1905   NITRITE NEGATIVE 07/21/2022 1905   LEUKOCYTESUR NEGATIVE 07/21/2022 1905    Radiological Exams on Admission: DG Chest 2 View  Result Date: 07/21/2022 CLINICAL DATA:  Cough for 2 months. Shortness of breath for 2 weeks. EXAM: CHEST - 2 VIEW COMPARISON:  09/06/2007. FINDINGS: Trachea is midline. Heart size normal. No airspace consolidation or pleural fluid. Visualized abdomen is grossly unremarkable. IMPRESSION: No acute findings. Electronically Signed   By: Leanna Battles M.D.   On: 07/21/2022 14:57    EKG: Independently reviewed.  Sinus rhythm, rate 80, QTc 482.  No significant change from  prior.  Assessment/Plan Principal Problem:   Cirrhosis of liver with ascites (HCC) Active Problems:   COPD (chronic obstructive pulmonary disease) (HCC)   HTN (hypertension)   Alcohol abuse   History of hepatitis C   AKI (acute kidney injury) (HCC)  Assessment and Plan: * Cirrhosis of liver with ascites (HCC) Alcoholic liver cirrhosis with ascites.  Abdomen distended.  Has not had paracentesis in the past.  Tmax 99.1.  WBC 6.6.  Influenza and COVID test negative.  INR - 2.1.  With thrombocytopenia of 71.  T. bili 6.1. -Ultrasound-guided paracentesis in a.m. - Ascitic Fluid analysis -Continue 2 g ceftriaxone daily for now -Blood pressure soft, albumin 12.5 g, and Lasix 40 mg x 1 given in ED. - Hold off on further diuretics at this time.  HTN (hypertension) Hypotensive, systolic down to 97. -Hold lisinopril 5 mg, Lasix 40 mg and spironolactone 100 for now  COPD (chronic obstructive pulmonary disease) (HCC) Stable. -Resume home regimen  Thrombocytopenia (HCC) 2/2 alcoholic liver cirrhosis. - Trend  AKI (acute kidney injury) (HCC) Creatinine elevated at 1.7, baseline 0.5-0.7. ?  Secondary to ascites, versus hypotension versus hepato-renal syndrome vs diuretics. -Trend creatinine -Hold lisinopril, Lasix and spironolactone  Alcohol abuse Reports she quit drinking alcohol 3 weeks ago.  She plans to quit drinking alcohol permanently. -CIWA as needed  - Thiamine, folate, multivitamins -Check magnesium and Phos -Cessation encouraged   DVT prophylaxis: SCDS Code Status: Full Family Communication: Daughter at bedside. Disposition Plan: ~ 2 days Consults called: None Admission status:  Obs tele  Author: Onnie Boer, MD 07/21/2022 9:22 PM  For on call review [email or web address].

## 2022-07-22 ENCOUNTER — Observation Stay (HOSPITAL_COMMUNITY): Payer: 59

## 2022-07-22 DIAGNOSIS — K7460 Unspecified cirrhosis of liver: Secondary | ICD-10-CM | POA: Diagnosis not present

## 2022-07-22 DIAGNOSIS — K7031 Alcoholic cirrhosis of liver with ascites: Secondary | ICD-10-CM | POA: Diagnosis not present

## 2022-07-22 DIAGNOSIS — R69 Illness, unspecified: Secondary | ICD-10-CM | POA: Diagnosis not present

## 2022-07-22 DIAGNOSIS — R188 Other ascites: Secondary | ICD-10-CM | POA: Diagnosis not present

## 2022-07-22 LAB — CBC
HCT: 26.7 % — ABNORMAL LOW (ref 36.0–46.0)
Hemoglobin: 8.9 g/dL — ABNORMAL LOW (ref 12.0–15.0)
MCH: 33.3 pg (ref 26.0–34.0)
MCHC: 33.3 g/dL (ref 30.0–36.0)
MCV: 100 fL (ref 80.0–100.0)
Platelets: 65 10*3/uL — ABNORMAL LOW (ref 150–400)
RBC: 2.67 MIL/uL — ABNORMAL LOW (ref 3.87–5.11)
RDW: 15 % (ref 11.5–15.5)
WBC: 6.9 10*3/uL (ref 4.0–10.5)
nRBC: 0 % (ref 0.0–0.2)

## 2022-07-22 LAB — BODY FLUID CELL COUNT WITH DIFFERENTIAL
Eos, Fluid: 0 %
Lymphs, Fluid: 53 %
Monocyte-Macrophage-Serous Fluid: 41 % — ABNORMAL LOW (ref 50–90)
Neutrophil Count, Fluid: 6 % (ref 0–25)
Total Nucleated Cell Count, Fluid: 104 cu mm (ref 0–1000)

## 2022-07-22 LAB — LACTATE DEHYDROGENASE, PLEURAL OR PERITONEAL FLUID: LD, Fluid: 28 U/L — ABNORMAL HIGH (ref 3–23)

## 2022-07-22 LAB — BASIC METABOLIC PANEL
Anion gap: 7 (ref 5–15)
BUN: 28 mg/dL — ABNORMAL HIGH (ref 8–23)
CO2: 30 mmol/L (ref 22–32)
Calcium: 9.7 mg/dL (ref 8.9–10.3)
Chloride: 98 mmol/L (ref 98–111)
Creatinine, Ser: 1.67 mg/dL — ABNORMAL HIGH (ref 0.44–1.00)
GFR, Estimated: 35 mL/min — ABNORMAL LOW (ref >60)
Glucose, Bld: 108 mg/dL — ABNORMAL HIGH (ref 70–99)
Potassium: 4 mmol/L (ref 3.5–5.1)
Sodium: 135 mmol/L (ref 135–145)

## 2022-07-22 LAB — GLUCOSE, PLEURAL OR PERITONEAL FLUID: Glucose, Fluid: 112 mg/dL

## 2022-07-22 LAB — GRAM STAIN

## 2022-07-22 LAB — PROTEIN, PLEURAL OR PERITONEAL FLUID: Total protein, fluid: <3 g/dL

## 2022-07-22 MED ORDER — DM-GUAIFENESIN ER 30-600 MG PO TB12
1.0000 | ORAL_TABLET | Freq: Two times a day (BID) | ORAL | Status: DC
  Administered 2022-07-22 – 2022-07-23 (×2): 1 via ORAL
  Filled 2022-07-22 (×2): qty 1

## 2022-07-22 MED ORDER — LIDOCAINE-SODIUM BICARBONATE 1-8.4 % IJ SOSY
1.0000 mL | PREFILLED_SYRINGE | INTRAMUSCULAR | Status: DC | PRN
  Administered 2022-07-22: 1 mL via SUBCUTANEOUS
  Filled 2022-07-22: qty 1

## 2022-07-22 NOTE — Progress Notes (Signed)
Patient ordered oxygen via nasal cannula at 2L/Min. Patient did not want oxygen on, stated " I don't have trouble with breathing". MD notified via secure chat.

## 2022-07-22 NOTE — Progress Notes (Signed)
PROGRESS NOTE     Karla Young, is a 61 y.o. female, DOB - 02/15/1961, OZD:[id number]  Admit date - 07/21/2022   Admitting Physician Onnie BoerEjiroghene E Loriel Diehl, MD  Outpatient Primary MD for the patient is Waldon ReiningBrowning, Douglas, MD  LOS - 0  Chief Complaint  Patient presents with   Shortness of Breath   Cough   Ascites        Brief Narrative:  61 y.o. female with medical history significant for alcoholic liver cirrhosis, COPD, hypertension, IBS, treated hepatitis C admitted on 07/21/2022 with decompensated liver cirrhosis with ascites    -Assessment and Plan: 1) alcoholic liver cirrhosis with ascites  -  Has not had paracentesis in the past.   -No fever or leukocytosis -GI input appreciated -Plans for diagnostic and therapeutic paracentesis on 07/22/2022 -  INR - 2.1.  With thrombocytopenia  -.  T. bili 6.1. --Continue empiric 2 g ceftriaxone daily for SBP prophylaxis -Will consider additional diuresis at BP allows -Consider albumin infusion with paracentesis  2)HTN (hypertension) -Soft BP Hold Lisinopril  ,  Lasix  and spironolactone to avoid significant hypotension especially in the setting of planned paracentesis  3)COPD (chronic obstructive pulmonary disease) (HCC) Stable. -Resume home regimen  4)Thrombocytopenia (HCC) -Suspect due to combination of direct toxic effect of alcohol on bone marrow and also thrombocytopenia due to underlying liver cirrhosis  5)AKI----acute kidney injury-in the setting of hypertension on ACE and diuretic use --Hold lisinopril, Lasix and spironolactone renally adjust medications, avoid nephrotoxic agents / dehydration  / hypotension  6)Alcohol abuse Reports she quit drinking alcohol 3 weeks ago.  She plans to quit drinking alcohol permanently. -Benzos per CIWA as needed  - Thiamine, folate, multivitamins  Disposition-soft blood pressures, patient requiring paracentesis, unable to restart BP and diuretic medications, possible discharge home  in 24 to 48 hours if hemodynamically stable after paracentesis and if ascitic fluid analysis is reassuring  Disposition: The patient is from: Home              Anticipated d/c is to: Home              Anticipated d/c date is: 1 day              Patient currently is not medically stable to d/c. Barriers: Not Clinically Stable-   Code Status :  -  Code Status: Full Code   Family Communication:    NA (patient is alert, awake and coherent)   DVT Prophylaxis  :   - SCDs  SCDs Start: 07/21/22 2111   Lab Results  Component Value Date   PLT 65 (L) 07/22/2022    Inpatient Medications  Scheduled Meds:  dextromethorphan-guaiFENesin  1 tablet Oral BID   folic acid  1 mg Oral Daily   levothyroxine  88 mcg Oral QAC breakfast   multivitamin with minerals  1 tablet Oral Daily   pantoprazole  40 mg Oral Daily   thiamine  100 mg Oral Daily   Or   thiamine  100 mg Intravenous Daily   Continuous Infusions:  cefTRIAXone (ROCEPHIN)  IV     PRN Meds:.albuterol, hydrOXYzine, LORazepam **OR** LORazepam, ondansetron **OR** ondansetron (ZOFRAN) IV, polyethylene glycol   Anti-infectives (From admission, onward)    Start     Dose/Rate Route Frequency Ordered Stop   07/22/22 1800  cefTRIAXone (ROCEPHIN) 2 g in sodium chloride 0.9 % 100 mL IVPB        2 g 200 mL/hr over 30 Minutes Intravenous Every 24 hours  07/21/22 2122     07/21/22 1815  cefTRIAXone (ROCEPHIN) 2 g in sodium chloride 0.9 % 100 mL IVPB        2 g 200 mL/hr over 30 Minutes Intravenous  Once 07/21/22 1812 07/21/22 2001         Subjective: Karla High today has no fevers, no emesis,  No chest pain,   -Complains of nausea and abdominal distention  Objective: Vitals:   07/22/22 0900 07/22/22 1000 07/22/22 1035 07/22/22 1253  BP: 114/65 (!) 100/56 99/63 96/60   Pulse: 82   80  Resp: 16     Temp:    98.3 F (36.8 C)  TempSrc:    Oral  SpO2: 99%   100%  Weight:        Intake/Output Summary (Last 24 hours) at 07/22/2022  1710 Last data filed at 07/22/2022 1300 Gross per 24 hour  Intake 720 ml  Output --  Net 720 ml   Filed Weights   07/21/22 2119  Weight: 74.6 kg    Physical Exam  Gen:- Awake Alert, no acute distress HEENT:- Branch.AT, +ve  sclera icterus Neck-Supple Neck,No JVD,.  Lungs-  CTAB , fair symmetrical air movement CV- S1, S2 normal, regular  Abd-  +ve B.Sounds, Abd Soft, No significant tenderness,   ascites and abdominal distention noted Extremity/Skin:-  pedal pulses present  Psych-affect is appropriate, oriented x3 Neuro-no new focal deficits, no tremors  Data Reviewed: I have personally reviewed following labs and imaging studies  CBC: Recent Labs  Lab 07/21/22 1456 07/22/22 0335  WBC 6.6 6.9  HGB 10.4* 8.9*  HCT 31.9* 26.7*  MCV 100.3* 100.0  PLT 71* 65*   Basic Metabolic Panel: Recent Labs  Lab 07/21/22 1450 07/21/22 1456 07/21/22 2026 07/22/22 0335  NA  --  133*  --  135  K  --  3.5  --  4.0  CL  --  96*  --  98  CO2  --  28  --  30  GLUCOSE  --  138*  --  108*  BUN  --  26*  --  28*  CREATININE  --  1.70*  --  1.67*  CALCIUM  --  9.9  --  9.7  MG 1.8  --   --   --   PHOS  --   --  3.7  --    GFR: Estimated Creatinine Clearance: 34.2 mL/min (A) (by C-G formula based on SCr of 1.67 mg/dL (H)). Liver Function Tests: Recent Labs  Lab 07/21/22 1456  AST 71*  ALT 38  ALKPHOS 81  BILITOT 6.1*  PROT 6.7  ALBUMIN 2.4*   Recent Results (from the past 240 hour(s))  Resp panel by RT-PCR (RSV, Flu A&B, Covid) Anterior Nasal Swab     Status: None   Collection Time: 07/21/22  6:08 PM   Specimen: Anterior Nasal Swab  Result Value Ref Range Status   SARS Coronavirus 2 by RT PCR NEGATIVE NEGATIVE Final    Comment: (NOTE) SARS-CoV-2 target nucleic acids are NOT DETECTED.  The SARS-CoV-2 RNA is generally detectable in upper respiratory specimens during the acute phase of infection. The lowest concentration of SARS-CoV-2 viral copies this assay can detect  is 138 copies/mL. A negative result does not preclude SARS-Cov-2 infection and should not be used as the sole basis for treatment or other patient management decisions. A negative result may occur with  improper specimen collection/handling, submission of specimen other than nasopharyngeal swab, presence of viral mutation(s)  within the areas targeted by this assay, and inadequate number of viral copies(<138 copies/mL). A negative result must be combined with clinical observations, patient history, and epidemiological information. The expected result is Negative.  Fact Sheet for Patients:  BloggerCourse.com  Fact Sheet for Healthcare Providers:  SeriousBroker.it  This test is no t yet approved or cleared by the [place] FDA and  has been authorized for detection and/or diagnosis of SARS-CoV-2 by FDA under an Emergency Use Authorization (EUA). This EUA will remain  in effect (meaning this test can be used) for the duration of the COVID-19 declaration under Section 564(b)(1) of the Act, 21 U.S.C.section 360bbb-3(b)(1), unless the authorization is terminated  or revoked sooner.       Influenza A by PCR NEGATIVE NEGATIVE Final   Influenza B by PCR NEGATIVE NEGATIVE Final    Comment: (NOTE) The Xpert Xpress SARS-CoV-2/FLU/RSV plus assay is intended as an aid in the diagnosis of influenza from Nasopharyngeal swab specimens and should not be used as a sole basis for treatment. Nasal washings and aspirates are unacceptable for Xpert Xpress SARS-CoV-2/FLU/RSV testing.  Fact Sheet for Patients: BloggerCourse.com  Fact Sheet for Healthcare Providers: SeriousBroker.it  This test is not yet approved or cleared by the [place] FDA and has been authorized for detection and/or diagnosis of SARS-CoV-2 by FDA under an Emergency Use Authorization (EUA). This EUA will remain in effect  (meaning this test can be used) for the duration of the COVID-19 declaration under Section 564(b)(1) of the Act, 21 U.S.C. section 360bbb-3(b)(1), unless the authorization is terminated or revoked.     Resp Syncytial Virus by PCR NEGATIVE NEGATIVE Final    Comment: (NOTE) Fact Sheet for Patients: BloggerCourse.com  Fact Sheet for Healthcare Providers: SeriousBroker.it  This test is not yet approved or cleared by the [place] FDA and has been authorized for detection and/or diagnosis of SARS-CoV-2 by FDA under an Emergency Use Authorization (EUA). This EUA will remain in effect (meaning this test can be used) for the duration of the COVID-19 declaration under Section 564(b)(1) of the Act, 21 U.S.C. section 360bbb-3(b)(1), unless the authorization is terminated or revoked.  Performed at [facility], [address] [place], [place] [address]   Blood culture (routine x 2)     Status: None (Preliminary result)   Collection Time: 07/21/22  6:34 PM   Specimen: BLOOD RIGHT FOREARM  Result Value Ref Range Status   Specimen Description BLOOD RIGHT FOREARM  Final   Special Requests   Final    BOTTLES DRAWN AEROBIC ONLY Blood Culture adequate volume   Culture   Final    NO GROWTH < 12 HOURS Performed at [facility], [address] [place], [place] [address]    Report Status PENDING  Incomplete  Blood culture (routine x 2)     Status: None (Preliminary result)   Collection Time: 07/21/22  6:34 PM   Specimen: BLOOD  Result Value Ref Range Status   Specimen Description BLOOD BLOOD LEFT HAND  Final   Special Requests   Final    BOTTLES DRAWN AEROBIC AND ANAEROBIC Blood Culture results may not be optimal due to an inadequate volume of blood received in culture bottles   Culture   Final    NO GROWTH < 12 HOURS Performed at [facility], [address] [place], [place] [address]    Report Status PENDING  Incomplete  Gram stain      Status: None (Preliminary result)   Collection Time: 07/22/22  10:00 AM   Specimen: Ascitic  Result Value Ref Range Status   Specimen Description ASCITIC  Final   Special Requests NONE  Final   Gram Stain   Final    NO ORGANISMS SEEN WBC PRESENT,BOTH PMN AND MONONUCLEAR CYTOSPIN SMEAR Performed at [facility], [address] [place], [place] [address]    Report Status PENDING  Incomplete      Radiology Studies: US Paracentesis  Result Date: 07/22/2022 INDICATION: Cirrhosis, ascites EXAM: ULTRASOUND GUIDED RIGHT LOWER QUADRANT PARACENTESIS MEDICATIONS: None. COMPLICATIONS: None immediate. PROCEDURE: Informed written consent was obtained from the patient after a discussion of the risks, benefits and alternatives to treatment. A timeout was performed prior to the initiation of the procedure. Initial ultrasound scanning demonstrates a large amount of ascites within the right lower abdominal quadrant. The right lower abdomen was prepped and draped in the usual sterile fashion. 1% lidocaine was used for local anesthesia. Following this, a 6 Fr Safe-T-Centesis catheter was introduced. An ultrasound image was saved for documentation purposes. The paracentesis was performed. The catheter was removed and a dressing was applied. The patient tolerated the procedure well without immediate post procedural complication. FINDINGS: A total of approximately 4L of ascitic fluid was removed. Samples were sent to the laboratory as requested by the clinical team. IMPRESSION: Successful ultrasound-guided paracentesis yielding 4 liters of peritoneal fluid. PLAN: If the patient eventually requires >/=2 paracenteses in a 30 day period, candidacy for formal evaluation by the [facility] Interventional Radiology Portal Hypertension Clinic will be assessed. Electronically Signed   By: Ulyses Southward M.D.   On: 07/22/2022 13:00   DG Chest 2 View  Result Date: 07/21/2022 CLINICAL DATA:  Cough for 2 months. Shortness of breath  for 2 weeks. EXAM: CHEST - 2 VIEW COMPARISON:  09/06/2007. FINDINGS: Trachea is midline. Heart size normal. No airspace consolidation or pleural fluid. Visualized abdomen is grossly unremarkable. IMPRESSION: No acute findings. Electronically Signed   By: Leanna Battles M.D.   On: 07/21/2022 14:57     Scheduled Meds:  dextromethorphan-guaiFENesin  1 tablet Oral BID   folic acid  1 mg Oral Daily   levothyroxine  88 mcg Oral QAC breakfast   multivitamin with minerals  1 tablet Oral Daily   pantoprazole  40 mg Oral Daily   thiamine  100 mg Oral Daily   Or   thiamine  100 mg Intravenous Daily   Continuous Infusions:  cefTRIAXone (ROCEPHIN)  IV       LOS: 0 days    Shon Hale M.D on 07/22/2022 at 5:10 PM  Go to [email or web address] - for contact info  [facility] - Office  [phone]  If 7PM-7AM, please contact night-coverage [email or web address] 07/22/2022, 5:10 PM

## 2022-07-22 NOTE — Progress Notes (Signed)
Patient transported to [place] per stretcher, no obvious distress.Paracentesis explained, consent obtained. Prepped and draped in usual manner. Access gained at Right abdomen without difficulty. Clear serous fluid returned, connected vacutainer. 4 L total obtained. Pressure bandage applied to site, transported to floor per stretcher in no acute distress.

## 2022-07-22 NOTE — Progress Notes (Signed)
Patients blood pressure is 90/52 taken manually on left arm. Dr Thomes Dinning notified via secure chat message.

## 2022-07-22 NOTE — Procedures (Signed)
PROCEDURE SUMMARY:  Successful ultrasound guided paracentesis from the right lower quadrant.  Yielded 4L of ascitic fluid.  No immediate complications.  The patient tolerated the procedure well.   Specimen was sent for labs.  EBL < 21mL  If the patient eventually requires >/=2 paracenteses in a 30 day period, screening evaluation by the [facility] Interventional Radiology Portal Hypertension Clinic will be assessed.  Electronically Signed: Sheliah Plane, PA-C 07/22/2022, 11:51 AM

## 2022-07-22 NOTE — TOC Initial Note (Addendum)
Transition of Care [facility]) - Initial/Assessment Note    Patient Details  Name: Karla Young MRN: [id number] Date of Birth: 05/11/1961  Transition of Care [facility]) CM/SW Contact:    Leitha Bleak, RN Phone Number: 07/22/2022, 2:48 PM  Clinical Narrative:     Patient admitted with cirrhosis. CM called and went to bedside. Patient having a parenthesis today. Substance abuse resources left with patient belongings and added to AVS. Patient has been given resources in the past. TOC will continue to call and follow up to answer questions.         MD continued discussion about alcohol abuse and the need to not drink. Resources provided.        Expected Discharge Plan: Home/Self Care Barriers to Discharge: Continued Medical Work up   Patient Goals and CMS Choice       Expected Discharge Plan and Services Expected Discharge Plan: Home/Self Care      Admission diagnosis:  Alcoholic cirrhosis of liver with ascites (HCC) [K70.31] AKI (acute kidney injury) (HCC) [N17.9] Cirrhosis of liver with ascites (HCC) [K74.60, R18.8] Patient Active Problem List   Diagnosis Date Noted   AKI (acute kidney injury) (HCC) 07/21/2022   Thrombocytopenia (HCC) 07/21/2022   ERRONEOUS ENCOUNTER--DISREGARD 07/19/2022   Alcohol dependence with unspecified alcohol-induced disorder (HCC) 05/26/2022   History of hepatitis C 02/22/2022   GERD (gastroesophageal reflux disease)    COPD (chronic obstructive pulmonary disease) (HCC) 08/06/2021   HTN (hypertension) 08/06/2021   Cirrhosis of liver with ascites (HCC) 08/03/2021   Alcohol abuse 08/03/2021   Irritable bowel syndrome (IBS) 08/03/2021   Hypothyroidism 02/12/2014   PCP:  Waldon Reining, MD Pharmacy:   [facility] [address], [place] - [address] [place] [address] Phone: [phone] Fax: [phone]

## 2022-07-23 ENCOUNTER — Telehealth: Payer: Self-pay | Admitting: Gastroenterology

## 2022-07-23 DIAGNOSIS — K7031 Alcoholic cirrhosis of liver with ascites: Secondary | ICD-10-CM | POA: Diagnosis not present

## 2022-07-23 DIAGNOSIS — R69 Illness, unspecified: Secondary | ICD-10-CM | POA: Diagnosis not present

## 2022-07-23 LAB — CBC
HCT: 32.3 % — ABNORMAL LOW (ref 36.0–46.0)
Hemoglobin: 10.7 g/dL — ABNORMAL LOW (ref 12.0–15.0)
MCH: 32.8 pg (ref 26.0–34.0)
MCHC: 33.1 g/dL (ref 30.0–36.0)
MCV: 99.1 fL (ref 80.0–100.0)
Platelets: 67 10*3/uL — ABNORMAL LOW (ref 150–400)
RBC: 3.26 MIL/uL — ABNORMAL LOW (ref 3.87–5.11)
RDW: 15 % (ref 11.5–15.5)
WBC: 6.4 10*3/uL (ref 4.0–10.5)
nRBC: 0 % (ref 0.0–0.2)

## 2022-07-23 LAB — COMPREHENSIVE METABOLIC PANEL
ALT: 42 U/L (ref 0–44)
AST: 80 U/L — ABNORMAL HIGH (ref 15–41)
Albumin: 2.6 g/dL — ABNORMAL LOW (ref 3.5–5.0)
Alkaline Phosphatase: 74 U/L (ref 38–126)
Anion gap: 8 (ref 5–15)
BUN: 23 mg/dL (ref 8–23)
CO2: 29 mmol/L (ref 22–32)
Calcium: 9.2 mg/dL (ref 8.9–10.3)
Chloride: 99 mmol/L (ref 98–111)
Creatinine, Ser: 1.24 mg/dL — ABNORMAL HIGH (ref 0.44–1.00)
GFR, Estimated: 50 mL/min — ABNORMAL LOW (ref >60)
Glucose, Bld: 92 mg/dL (ref 70–99)
Potassium: 4.2 mmol/L (ref 3.5–5.1)
Sodium: 136 mmol/L (ref 135–145)
Total Bilirubin: 6.1 mg/dL — ABNORMAL HIGH (ref 0.3–1.2)
Total Protein: 7 g/dL (ref 6.5–8.1)

## 2022-07-23 LAB — PROTIME-INR
INR: 2.1 — ABNORMAL HIGH (ref 0.8–1.2)
Prothrombin Time: 22.9 seconds — ABNORMAL HIGH (ref 11.4–15.2)

## 2022-07-23 MED ORDER — LEVOTHYROXINE SODIUM 88 MCG PO TABS: 88.0000 ug | ORAL_TABLET | Freq: Every day | ORAL | 3 refills | Status: DC

## 2022-07-23 MED ORDER — LACTULOSE 10 GM/15ML PO SOLN: 10.0000 g | Freq: Every day | ORAL | 2 refills | Status: DC

## 2022-07-23 MED ORDER — SERTRALINE HCL 50 MG PO TABS: 50.0000 mg | ORAL_TABLET | Freq: Every day | ORAL | 3 refills | Status: DC

## 2022-07-23 MED ORDER — ALBUTEROL SULFATE HFA 108 (90 BASE) MCG/ACT IN AERS: 2.0000 | INHALATION_SPRAY | Freq: Four times a day (QID) | RESPIRATORY_TRACT | 2 refills | Status: DC | PRN

## 2022-07-23 MED ORDER — HYDROXYZINE HCL 25 MG PO TABS: 25.0000 mg | ORAL_TABLET | Freq: Three times a day (TID) | ORAL | 1 refills | Status: AC | PRN

## 2022-07-23 MED ORDER — ONDANSETRON HCL 4 MG PO TABS: 4.0000 mg | ORAL_TABLET | Freq: Four times a day (QID) | ORAL | 0 refills | Status: DC | PRN

## 2022-07-23 MED ORDER — FUROSEMIDE 40 MG PO TABS: 40.0000 mg | ORAL_TABLET | Freq: Every day | ORAL | 1 refills | Status: DC

## 2022-07-23 MED ORDER — FOLIC ACID 1 MG PO TABS: 1.0000 mg | ORAL_TABLET | Freq: Every day | ORAL | 3 refills | Status: DC

## 2022-07-23 MED ORDER — SPIRONOLACTONE 100 MG PO TABS: 100.0000 mg | ORAL_TABLET | Freq: Every day | ORAL | 1 refills | Status: DC

## 2022-07-23 MED ORDER — MULTI-VITAMIN/MINERALS PO TABS: 1.0000 | ORAL_TABLET | Freq: Every day | ORAL | 2 refills | Status: AC

## 2022-07-23 MED ORDER — VITAMIN B-1 100 MG PO TABS: 100.0000 mg | ORAL_TABLET | Freq: Every day | ORAL | 2 refills | Status: DC

## 2022-07-23 MED ORDER — LISINOPRIL 5 MG PO TABS: 5.0000 mg | ORAL_TABLET | Freq: Every day | ORAL | 3 refills | Status: DC

## 2022-07-23 MED ORDER — OMEPRAZOLE 40 MG PO CPDR: 40.0000 mg | DELAYED_RELEASE_CAPSULE | Freq: Every day | ORAL | 3 refills | Status: DC

## 2022-07-23 MED ORDER — ALBUTEROL SULFATE (2.5 MG/3ML) 0.083% IN NEBU: 2.5000 mg | INHALATION_SOLUTION | RESPIRATORY_TRACT | 5 refills | Status: DC | PRN

## 2022-07-23 NOTE — Discharge Instructions (Addendum)
1)Complete Abstinence from alcohol advised 2)Repeat CMP, CBC and BMP blood test within a week advised 3)Please Follow up with Gastroenterologist Dr. Derwood Kaplan about 2-3 weeks for recheck and reevaluation  -address [address], [place] [address],,CWUGQ Number [phone] 4)Very low-salt diet advised 5)Weigh yourself daily, call if you gain more than 3 pounds in 1 day or more than 5 pounds in 1 week as your diuretic medications may need to be adjusted 6)Take Lactulose as prescribed Goal is to have 2 to 3 Mushy/Loose stools per Day

## 2022-07-23 NOTE — Progress Notes (Signed)
Patient discharged home today, transported home by family. Discharge paperwork went over with patient, patient verbalized understanding. Belongings sent home with patient.  ?

## 2022-07-23 NOTE — Telephone Encounter (Signed)
Please make patient a follow up appointment in 2-3 weeks with [facility], Dr. Levon Hedger. If no availability can place with another APP.   Brooke Bonito, MSN, APRN, FNP-BC, AGACNP-BC [facility] at [facility]

## 2022-07-23 NOTE — Discharge Summary (Signed)
Karla Young, is a 61 y.o. female  DOB 01/14/1961  MRN [id number].  Admission date:  07/21/2022  Admitting Physician  Onnie Boer, MD  Discharge Date:  07/23/2022   Primary MD  Waldon Reining, MD  Recommendations for primary care physician for things to follow:   1)Complete Abstinence from alcohol advised 2)Repeat CMP, CBC and BMP blood test within a week advised 3)Please Follow up with Gastroenterologist Dr. Derwood Kaplan about 2-3 weeks for recheck and reevaluation  -address [address], [place] [address],,MCNOB Number [phone] 4)Very low-salt diet advised 5)Weigh yourself daily, call if you gain more than 3 pounds in 1 day or more than 5 pounds in 1 week as your diuretic medications may need to be adjusted 6)Take Lactulose as prescribed Goal is to have 2 to 3 Mushy/Loose stools per Day  Admission Diagnosis  Alcoholic cirrhosis of liver with ascites (HCC) [K70.31] AKI (acute kidney injury) (HCC) [N17.9] Cirrhosis of liver with ascites (HCC) [K74.60, R18.8]   Discharge Diagnosis  Alcoholic cirrhosis of liver with ascites (HCC) [K70.31] AKI (acute kidney injury) (HCC) [N17.9] Cirrhosis of liver with ascites (HCC) [K74.60, R18.8]    Principal Problem:   Cirrhosis of liver with ascites (HCC) Active Problems:   COPD (chronic obstructive pulmonary disease) (HCC)   HTN (hypertension)   Alcohol abuse   History of hepatitis C   AKI (acute kidney injury) (HCC)   Thrombocytopenia (HCC)      Past Medical History:  Diagnosis Date   Anxiety    COPD (chronic obstructive pulmonary disease) (HCC)    Fibromyalgia    GERD (gastroesophageal reflux disease)    Hepatitis C    Hypertension    Hypothyroid    Migraines    MRSA (methicillin resistant staph aureus) culture positive     Past Surgical History:  Procedure Laterality Date   ABDOMINAL HYSTERECTOMY     partial    BACK SURGERY     lumbar removal of disc   BIOPSY  09/06/2014   Procedure: BIOPSY;  Surgeon: Malissa Hippo, MD;  Location: [facility];  Service: Endoscopy;;   CHOLECYSTECTOMY     COLONOSCOPY WITH PROPOFOL N/A 09/06/2014   Procedure: COLONOSCOPY WITH PROPOFOL (Procedure #2) At cecum at 0819; total withdrawal time=13 minutes;  Surgeon: Malissa Hippo, MD;  Location: [facility];  Service: Endoscopy;  Laterality: N/A;   ESOPHAGEAL DILATION N/A 09/06/2014   Procedure: ESOPHAGEAL DILATION WITH MALONEY DILATOR ;  Surgeon: Malissa Hippo, MD;  Location: [facility];  Service: Endoscopy;  Laterality: N/A;   ESOPHAGOGASTRODUODENOSCOPY (EGD) WITH PROPOFOL N/A 09/06/2014   Procedure: ESOPHAGOGASTRODUODENOSCOPY (EGD) WITH PROPOFOL (Procedure #1) ;  Surgeon: Malissa Hippo, MD;  Location: [facility];  Service: Endoscopy;  Laterality: N/A;   ESOPHAGOGASTRODUODENOSCOPY (EGD) WITH PROPOFOL N/A 09/02/2021   Procedure: ESOPHAGOGASTRODUODENOSCOPY (EGD) WITH PROPOFOL;  Surgeon: Dolores Frame, MD;  Location: [facility];  Service: Gastroenterology;  Laterality: N/A;  830   HEMORRHOID SURGERY     Dr Vicenta Aly   PARTIAL HYSTERECTOMY  One ovary left   RECTAL PROLAPSE REPAIR         HPI  from the history and physical done on the day of admission:    Chief Complaint: Abdominal Pain   HPI: Karla Young is a 61 y.o. female with medical history significant for alcoholic liver cirrhosis, COPD, hypertension, IBS, treated hepatitis C. Patient presented to the ED with complaints of difficulty breathing with activity, and cough over the past 2 months.  Abdomen has been swollen for about a year, but over the past 6 days size of her abdomen has doubled. Abdomen feels tight but without pain.ght supple without no significant lower extremity swelling.  No chest pain.  She has never had paracentesis in the past.  No fevers no chills.   ED Course: Blood pressure systolic 95-111.  Tmax 99.1.  O2 sats 93 to 100%.  Heart  rate 80s. Creatinine elevated 1.7.  INR 2.1.  Chest x-ray clear. IV ceftriaxone started for possible SBP.  Hospitalist admit for alcoholic liver cirrhosis with ascites   Review of Systems: As per HPI all other systems reviewed and negative.   Hospital Course:   Assessment and Plan: 1) alcoholic liver cirrhosis with ascites  -  Has not had paracentesis in the past.   -No fever or leukocytosis -GI input appreciated S/p  diagnostic and therapeutic paracentesis on 07/22/2022 --INR, elevated bilirubin and thrombocytopenia due to underlying liver cirrhosis noted -No bleeding concerns -- Treated with empiric 2 g ceftriaxone daily for SBP prophylaxis -Adherence to complete abstinence from alcohol strongly encouraged   2)HTN (hypertension) -BP improved, BP medications and diuretics adjusted -Please see discharge med rec   3)COPD (chronic obstructive pulmonary disease) (HCC) Stable. -No acute exacerbation, continue bronchodilators   4)Thrombocytopenia (HCC) -Suspect due to combination of direct toxic effect of alcohol on bone marrow and also thrombocytopenia due to underlying liver cirrhosis -Repeat CBC with PCP and GI team as outpatient in 1 to 2 weeks   5)AKI----acute kidney injury-in the setting of hypertension on ACE and diuretic use -- Creatinine is down to 1.24 from 1.70 renally adjust medications, avoid nephrotoxic agents / dehydration  / hypotension -Repeat CMP with PCP/GI team in 1 to 2 weeks   6)Alcohol abuse Reports she quit drinking alcohol 3 weeks ago.  She plans to quit drinking alcohol permanently. -Benzos per CIWA as needed  - Thiamine, folate, multivitamins -No frank DTs symptoms    Disposition: The patient is from: Home              Anticipated d/c is to: Home  Discharge Condition: Hemodynamically more stable  Follow UP--- PCP and GI team   Follow-up Information     Dolores Frameastaneda Mayorga, Daniel, MD. Schedule an appointment as soon as possible for a visit in 2  week(s).   Specialty: Gastroenterology Contact information: [address] [place] [id number] [phone]                  Consults obtained -GI and interventional radiology  Diet and Activity recommendation:  As advised  Discharge Instructions    Discharge Instructions     Call MD for:  difficulty breathing, headache or visual disturbances   Complete by: As directed    Call MD for:  persistant dizziness or light-headedness   Complete by: As directed    Call MD for:  persistant nausea and vomiting   Complete by: As directed    Call MD for:  temperature >100.4   Complete  by: As directed    Diet - low sodium heart healthy   Complete by: As directed    Discharge instructions   Complete by: As directed    1)Complete Abstinence from alcohol advised 2)Repeat CMP, CBC and BMP blood test within a week advised 3)Please Follow up with Gastroenterologist Dr. Derwood Kaplan about 2-3 weeks for recheck and reevaluation  -address [address], [place] [address],,YQMVH Number [phone] 4)Very low-salt diet advised 5)Weigh yourself daily, call if you gain more than 3 pounds in 1 day or more than 5 pounds in 1 week as your diuretic medications may need to be adjusted 6)Take Lactulose as prescribed Goal is to have 2 to 3 Mushy/Loose stools per Day   Increase activity slowly   Complete by: As directed         Discharge Medications     Allergies as of 07/23/2022   No Known Allergies      Medication List     TAKE these medications    albuterol (2.5 MG/3ML) 0.083% nebulizer solution Commonly known as: PROVENTIL Take 3 mLs (2.5 mg total) by nebulization every 2 (two) hours as needed for wheezing.   albuterol 108 (90 Base) MCG/ACT inhaler Commonly known as: VENTOLIN HFA Inhale 2 puffs into the lungs every 6 (six) hours as needed for wheezing or shortness of breath.   ALPRAZolam 0.5 MG tablet Commonly known as: XANAX Take 0.5 mg by  mouth 2 (two) times daily as needed.   folic acid 1 MG tablet Commonly known as: FOLVITE Take 1 tablet (1 mg total) by mouth daily. Start taking on: July 24, 2022   furosemide 40 MG tablet Commonly known as: LASIX Take 1 tablet (40 mg total) by mouth daily.   hydrOXYzine 25 MG tablet Commonly known as: ATARAX Take 1 tablet (25 mg total) by mouth every 8 (eight) hours as needed for anxiety or nausea. What changed: reasons to take this   lactulose 10 GM/15ML solution Commonly known as: CHRONULAC Take 15 mLs (10 g total) by mouth daily. Goal is to have 2 to 3 Mushy/Loose stools per Day   levothyroxine 88 MCG tablet Commonly known as: SYNTHROID Take 1 tablet (88 mcg total) by mouth daily before breakfast.   Linzess 145 MCG Caps capsule Generic drug: linaclotide Take 145 mcg by mouth daily before breakfast.   lisinopril 5 MG tablet Commonly known as: ZESTRIL Take 1 tablet (5 mg total) by mouth daily. What changed: Another medication with the same name was removed. Continue taking this medication, and follow the directions you see here.   multivitamin with minerals tablet Take 1 tablet by mouth daily.   multivitamin with minerals Tabs tablet Take 1 tablet by mouth daily.   omeprazole 40 MG capsule Commonly known as: PRILOSEC Take 1 capsule (40 mg total) by mouth daily.   ondansetron 4 MG tablet Commonly known as: ZOFRAN Take 1 tablet (4 mg total) by mouth every 6 (six) hours as needed for nausea.   sertraline 50 MG tablet Commonly known as: ZOLOFT Take 1 tablet (50 mg total) by mouth daily.   spironolactone 100 MG tablet Commonly known as: ALDACTONE Take 1 tablet (100 mg total) by mouth daily.   thiamine 100 MG tablet Commonly known as: Vitamin B-1 Take 1 tablet (100 mg total) by mouth daily. Start taking on: July 24, 2022        Major procedures and Radiology Reports - PLEASE review detailed and final reports for all details, in  brief -    US  Paracentesis  Result Date: 07/22/2022 INDICATION: Cirrhosis, ascites EXAM: ULTRASOUND GUIDED RIGHT LOWER QUADRANT PARACENTESIS MEDICATIONS: None. COMPLICATIONS: None immediate. PROCEDURE: Informed written consent was obtained from the patient after a discussion of the risks, benefits and alternatives to treatment. A timeout was performed prior to the initiation of the procedure. Initial ultrasound scanning demonstrates a large amount of ascites within the right lower abdominal quadrant. The right lower abdomen was prepped and draped in the usual sterile fashion. 1% lidocaine was used for local anesthesia. Following this, a 6 Fr Safe-T-Centesis catheter was introduced. An ultrasound image was saved for documentation purposes. The paracentesis was performed. The catheter was removed and a dressing was applied. The patient tolerated the procedure well without immediate post procedural complication. FINDINGS: A total of approximately 4L of ascitic fluid was removed. Samples were sent to the laboratory as requested by the clinical team. IMPRESSION: Successful ultrasound-guided paracentesis yielding 4 liters of peritoneal fluid. PLAN: If the patient eventually requires >/=2 paracenteses in a 30 day period, candidacy for formal evaluation by the [facility] will be assessed. Electronically Signed   By: Ulyses Southward M.D.   On: 07/22/2022 13:00   DG Chest 2 View  Result Date: 07/21/2022 CLINICAL DATA:  Cough for 2 months. Shortness of breath for 2 weeks. EXAM: CHEST - 2 VIEW COMPARISON:  09/06/2007. FINDINGS: Trachea is midline. Heart size normal. No airspace consolidation or pleural fluid. Visualized abdomen is grossly unremarkable. IMPRESSION: No acute findings. Electronically Signed   By: Leanna Battles M.D.   On: 07/21/2022 14:57    Micro Results   Recent Results (from the past 240 hour(s))  Resp panel by RT-PCR (RSV, Flu A&B, Covid) Anterior Nasal Swab      Status: None   Collection Time: 07/21/22  6:08 PM   Specimen: Anterior Nasal Swab  Result Value Ref Range Status   SARS Coronavirus 2 by RT PCR NEGATIVE NEGATIVE Final    Comment: (NOTE) SARS-CoV-2 target nucleic acids are NOT DETECTED.  The SARS-CoV-2 RNA is generally detectable in upper respiratory specimens during the acute phase of infection. The lowest concentration of SARS-CoV-2 viral copies this assay can detect is 138 copies/mL. A negative result does not preclude SARS-Cov-2 infection and should not be used as the sole basis for treatment or other patient management decisions. A negative result may occur with  improper specimen collection/handling, submission of specimen other than nasopharyngeal swab, presence of viral mutation(s) within the areas targeted by this assay, and inadequate number of viral copies(<138 copies/mL). A negative result must be combined with clinical observations, patient history, and epidemiological information. The expected result is Negative.  Fact Sheet for Patients:  BloggerCourse.com  Fact Sheet for Healthcare Providers:  SeriousBroker.it  This test is no t yet approved or cleared by the [place] FDA and  has been authorized for detection and/or diagnosis of SARS-CoV-2 by FDA under an Emergency Use Authorization (EUA). This EUA will remain  in effect (meaning this test can be used) for the duration of the COVID-19 declaration under Section 564(b)(1) of the Act, 21 U.S.C.section 360bbb-3(b)(1), unless the authorization is terminated  or revoked sooner.       Influenza A by PCR NEGATIVE NEGATIVE Final   Influenza B by PCR NEGATIVE NEGATIVE Final    Comment: (NOTE) The Xpert Xpress SARS-CoV-2/FLU/RSV plus assay is intended as an aid in the diagnosis of influenza from Nasopharyngeal swab specimens and should not be used  as a sole basis for treatment. Nasal washings and aspirates are  unacceptable for Xpert Xpress SARS-CoV-2/FLU/RSV testing.  Fact Sheet for Patients: BloggerCourse.com  Fact Sheet for Healthcare Providers: SeriousBroker.it  This test is not yet approved or cleared by the [place] FDA and has been authorized for detection and/or diagnosis of SARS-CoV-2 by FDA under an Emergency Use Authorization (EUA). This EUA will remain in effect (meaning this test can be used) for the duration of the COVID-19 declaration under Section 564(b)(1) of the Act, 21 U.S.C. section 360bbb-3(b)(1), unless the authorization is terminated or revoked.     Resp Syncytial Virus by PCR NEGATIVE NEGATIVE Final    Comment: (NOTE) Fact Sheet for Patients: BloggerCourse.com  Fact Sheet for Healthcare Providers: SeriousBroker.it  This test is not yet approved or cleared by the [place] FDA and has been authorized for detection and/or diagnosis of SARS-CoV-2 by FDA under an Emergency Use Authorization (EUA). This EUA will remain in effect (meaning this test can be used) for the duration of the COVID-19 declaration under Section 564(b)(1) of the Act, 21 U.S.C. section 360bbb-3(b)(1), unless the authorization is terminated or revoked.  Performed at [facility], [address] [place], [place] [address]   Blood culture (routine x 2)     Status: None (Preliminary result)   Collection Time: 07/21/22  6:34 PM   Specimen: BLOOD RIGHT FOREARM  Result Value Ref Range Status   Specimen Description BLOOD RIGHT FOREARM  Final   Special Requests   Final    BOTTLES DRAWN AEROBIC ONLY Blood Culture adequate volume   Culture   Final    NO GROWTH 2 DAYS Performed at [facility], [address] [place], [place] [address]    Report Status PENDING  Incomplete  Blood culture (routine x 2)     Status: None (Preliminary result)   Collection Time: 07/21/22  6:34 PM   Specimen:  BLOOD  Result Value Ref Range Status   Specimen Description BLOOD BLOOD LEFT HAND  Final   Special Requests   Final    BOTTLES DRAWN AEROBIC AND ANAEROBIC Blood Culture results may not be optimal due to an inadequate volume of blood received in culture bottles   Culture   Final    NO GROWTH 2 DAYS Performed at [facility], [address] [place], [place] [address]    Report Status PENDING  Incomplete  Culture, body fluid w Gram Stain-bottle     Status: None (Preliminary result)   Collection Time: 07/22/22 10:00 AM   Specimen: Ascitic  Result Value Ref Range Status   Specimen Description ASCITIC  Final   Special Requests BOTTLES DRAWN AEROBIC AND ANAEROBIC 10CC  Final   Culture   Final    NO GROWTH < 24 HOURS Performed at [facility], [address], [place], [place] [address]    Report Status PENDING  Incomplete  Gram stain     Status: None   Collection Time: 07/22/22 10:00 AM   Specimen: Ascitic  Result Value Ref Range Status   Specimen Description ASCITIC  Final   Special Requests NONE  Final   Gram Stain   Final    NO ORGANISMS SEEN WBC PRESENT,BOTH PMN AND MONONUCLEAR CYTOSPIN SMEAR Performed at [facility], [address], [place], [place] [address]    Report Status 07/22/2022 FINAL  Final    Today   Subjective    Karla High today has no new complaints No fever  Or chills   No Nausea,  Vomiting or Diarrhea  -At patient's request I called and updated patient's daughter who is an Charity fundraiser by phone--- questions answered --patient's clinical condition and labs reviewed with patient's daughter who is an Charity fundraiser at patient's request    Patient has been seen and examined prior to discharge   Objective   Blood pressure 111/65, pulse 73, temperature 98.3 F (36.8 C), temperature source Oral, resp. rate 18, weight 74.6 kg, SpO2 100 %.   Intake/Output Summary (Last 24 hours) at 07/23/2022 1244 Last data filed at 07/23/2022 0500 Gross per 24 hour  Intake 1060 ml  Output --   Net 1060 ml    Exam Gen:- Awake Alert, no acute distress  HEENT:- [place].AT, +ve sclera icterus Neck-Supple Neck,No JVD,.  Lungs-  CTAB , good air movement bilaterally CV- S1, S2 normal, regular Abd-  +ve B.Sounds, Abd Soft, No tenderness, much improved abdominal distention/ascites Extremity/Skin:-Much improved edema,   good pulses Psych-affect is appropriate, oriented x3 Neuro-no new focal deficits, no tremors    Data Review   CBC w Diff:  Lab Results  Component Value Date   WBC 6.4 07/23/2022   HGB 10.7 (L) 07/23/2022   HCT 32.3 (L) 07/23/2022   PLT 67 (L) 07/23/2022   LYMPHOPCT 25 02/18/2015   MONOPCT 8.2 02/23/2022   EOSPCT 4.2 02/23/2022   BASOPCT 1.0 02/23/2022    CMP:  Lab Results  Component Value Date   NA 136 07/23/2022   K 4.2 07/23/2022   CL 99 07/23/2022   CO2 29 07/23/2022   BUN 23 07/23/2022   CREATININE 1.24 (H) 07/23/2022   CREATININE 0.67 03/23/2022   PROT 7.0 07/23/2022   ALBUMIN 2.6 (L) 07/23/2022   BILITOT 6.1 (H) 07/23/2022   ALKPHOS 74 07/23/2022   AST 80 (H) 07/23/2022   ALT 42 07/23/2022  .  Total Discharge time is about 33 minutes  Shon Hale M.D on 07/23/2022 at 12:44 PM  Go to [email or web address] -  for contact info  [facility] - Office  [phone]

## 2022-07-26 ENCOUNTER — Telehealth (INDEPENDENT_AMBULATORY_CARE_PROVIDER_SITE_OTHER): Payer: Self-pay | Admitting: *Deleted

## 2022-07-26 ENCOUNTER — Encounter (INDEPENDENT_AMBULATORY_CARE_PROVIDER_SITE_OTHER): Payer: Self-pay | Admitting: *Deleted

## 2022-07-26 ENCOUNTER — Other Ambulatory Visit (INDEPENDENT_AMBULATORY_CARE_PROVIDER_SITE_OTHER): Payer: Self-pay | Admitting: *Deleted

## 2022-07-26 DIAGNOSIS — K760 Fatty (change of) liver, not elsewhere classified: Secondary | ICD-10-CM

## 2022-07-26 DIAGNOSIS — K582 Mixed irritable bowel syndrome: Secondary | ICD-10-CM

## 2022-07-26 DIAGNOSIS — Z8619 Personal history of other infectious and parasitic diseases: Secondary | ICD-10-CM

## 2022-07-26 DIAGNOSIS — R188 Other ascites: Secondary | ICD-10-CM

## 2022-07-26 DIAGNOSIS — I10 Essential (primary) hypertension: Secondary | ICD-10-CM

## 2022-07-26 LAB — CULTURE, BLOOD (ROUTINE X 2)
Culture: NO GROWTH
Culture: NO GROWTH
Special Requests: ADEQUATE

## 2022-07-26 LAB — PH, BODY FLUID: pH, Body Fluid: 7.3

## 2022-07-26 NOTE — Telephone Encounter (Signed)
Patient's daughter [name] ( not on dpr ) called to get follow up lab orders. Reports she is due for labs one week from being discharged from hospital on 12/15. She states she was told she needs cbc, bmp and liver score. She has a hospital follow up on 08/11/22.

## 2022-07-26 NOTE — Telephone Encounter (Signed)
Discussed with patient and she wanted [facility] at [facility]. Orders put in and pt aware to pick up orders and do one week from hospital discharge at [facility].

## 2022-07-26 NOTE — Telephone Encounter (Signed)
Please send her an order for CBC, CMP and INR Thanks

## 2022-07-26 NOTE — Telephone Encounter (Signed)
Left message to return call to discuss with patient and to see which lab she wanted to go for labs.

## 2022-07-27 LAB — CULTURE, BODY FLUID W GRAM STAIN -BOTTLE: Culture: NO GROWTH

## 2022-07-27 LAB — CYTOLOGY - NON PAP

## 2022-07-28 ENCOUNTER — Encounter (INDEPENDENT_AMBULATORY_CARE_PROVIDER_SITE_OTHER): Payer: Self-pay | Admitting: *Deleted

## 2022-08-03 DIAGNOSIS — N179 Acute kidney failure, unspecified: Secondary | ICD-10-CM | POA: Diagnosis not present

## 2022-08-03 DIAGNOSIS — R0602 Shortness of breath: Secondary | ICD-10-CM | POA: Diagnosis not present

## 2022-08-03 DIAGNOSIS — E872 Acidosis, unspecified: Secondary | ICD-10-CM | POA: Diagnosis not present

## 2022-08-03 DIAGNOSIS — Z9049 Acquired absence of other specified parts of digestive tract: Secondary | ICD-10-CM | POA: Diagnosis not present

## 2022-08-03 DIAGNOSIS — R140 Abdominal distension (gaseous): Secondary | ICD-10-CM | POA: Diagnosis not present

## 2022-08-03 DIAGNOSIS — R161 Splenomegaly, not elsewhere classified: Secondary | ICD-10-CM | POA: Diagnosis not present

## 2022-08-03 DIAGNOSIS — K7460 Unspecified cirrhosis of liver: Secondary | ICD-10-CM | POA: Diagnosis not present

## 2022-08-03 DIAGNOSIS — I10 Essential (primary) hypertension: Secondary | ICD-10-CM | POA: Diagnosis not present

## 2022-08-03 DIAGNOSIS — D649 Anemia, unspecified: Secondary | ICD-10-CM | POA: Insufficient documentation

## 2022-08-03 DIAGNOSIS — K7290 Hepatic failure, unspecified without coma: Secondary | ICD-10-CM | POA: Diagnosis not present

## 2022-08-03 DIAGNOSIS — R188 Other ascites: Secondary | ICD-10-CM | POA: Diagnosis not present

## 2022-08-04 DIAGNOSIS — I8510 Secondary esophageal varices without bleeding: Secondary | ICD-10-CM | POA: Diagnosis not present

## 2022-08-04 DIAGNOSIS — K7031 Alcoholic cirrhosis of liver with ascites: Secondary | ICD-10-CM | POA: Diagnosis not present

## 2022-08-04 DIAGNOSIS — D638 Anemia in other chronic diseases classified elsewhere: Secondary | ICD-10-CM | POA: Diagnosis not present

## 2022-08-04 DIAGNOSIS — D6959 Other secondary thrombocytopenia: Secondary | ICD-10-CM | POA: Diagnosis not present

## 2022-08-04 DIAGNOSIS — E8729 Other acidosis: Secondary | ICD-10-CM | POA: Diagnosis not present

## 2022-08-04 DIAGNOSIS — N179 Acute kidney failure, unspecified: Secondary | ICD-10-CM | POA: Diagnosis not present

## 2022-08-04 DIAGNOSIS — E039 Hypothyroidism, unspecified: Secondary | ICD-10-CM | POA: Diagnosis not present

## 2022-08-04 DIAGNOSIS — J449 Chronic obstructive pulmonary disease, unspecified: Secondary | ICD-10-CM | POA: Diagnosis not present

## 2022-08-04 DIAGNOSIS — D684 Acquired coagulation factor deficiency: Secondary | ICD-10-CM | POA: Diagnosis not present

## 2022-08-11 ENCOUNTER — Telehealth: Payer: Self-pay | Admitting: *Deleted

## 2022-08-11 ENCOUNTER — Ambulatory Visit (INDEPENDENT_AMBULATORY_CARE_PROVIDER_SITE_OTHER): Payer: Medicaid Other | Admitting: Gastroenterology

## 2022-08-11 ENCOUNTER — Encounter: Payer: Self-pay | Admitting: Gastroenterology

## 2022-08-11 VITALS — BP 107/62 | HR 60 | Temp 97.8°F | Ht 63.0 in | Wt 155.0 lb

## 2022-08-11 DIAGNOSIS — K7460 Unspecified cirrhosis of liver: Secondary | ICD-10-CM | POA: Diagnosis not present

## 2022-08-11 DIAGNOSIS — R188 Other ascites: Secondary | ICD-10-CM

## 2022-08-11 DIAGNOSIS — K7031 Alcoholic cirrhosis of liver with ascites: Secondary | ICD-10-CM

## 2022-08-11 DIAGNOSIS — K219 Gastro-esophageal reflux disease without esophagitis: Secondary | ICD-10-CM

## 2022-08-11 DIAGNOSIS — K5900 Constipation, unspecified: Secondary | ICD-10-CM | POA: Diagnosis not present

## 2022-08-11 DIAGNOSIS — R69 Illness, unspecified: Secondary | ICD-10-CM | POA: Diagnosis not present

## 2022-08-11 NOTE — Patient Instructions (Addendum)
For cirrhosis: We are ordering a routine ultrasound that we will do every 6 months I am also ordering a special ultrasound at same time to check blood flow of your liver We are arranging an upper endoscopy with Dr. Jenetta Downer We are referring you to talk about the TIPS procedure in [facility] We are referring you to [facility] to get plugged in with Transplant services.  I am ordering a standing para for just TWO times. Please call our office to let us know if you are doing the para so we can be aware. Do not take the lasix or spironolactone on the day you have your procedure.   I am referring to a Nutritionist as well   For constipation: Start taking Linzess 1 capsule 30 minutes before breakfast daily.    We will see you in 6 weeks!  I enjoyed seeing you again today! As you know, I value our relationship and want to provide genuine, compassionate, and quality care. I welcome your feedback. If you receive a survey regarding your visit,  I greatly appreciate you taking time to fill this out. See you next time!  Annitta Needs, PhD, ANP-BC [facility] Gastroenterology

## 2022-08-11 NOTE — H&P (View-Only) (Signed)
Gastroenterology Office Note     Primary Care Physician:  Leonie Douglas, MD  Primary Gastroenterologist: Dr. Jenetta Downer    Chief Complaint   Chief Complaint  Patient presents with   Follow-up    Pt here for hospital follow up     History of Present Illness   Karla Young is a 62 y.o. female presenting today in follow-up with a history of decompensated liver cirrhosis secondary to hepatitis C (previously treated in 2022 and again in 2015 s/p eradication) and alcoholism complicated by ascites and nonbleeding grade 1 esophageal varices, prior alcohol abuse, IBS, fibromyalgia, COPD, anxiety, hypertension, hypothyroidism, GERD and IBS. Here for hospital follow-up.Last ETOH last November.   She was inpatient at [facility] Dec 2023 with ascites and AKI. Underwent para with 4 liters removed. Negative SBP. She then was admitted at [facility] end of December 12/26-12/30 with recurrent abdominal distension, acute renal failure with creatinine 2.3 but improved to 0.93 on day of discharge. She underwent another para with 5 liters removed.   Diuretics were adjusted to Lasix 20mg   and spironolactone 50 mg daily (down from 40 and 100) during Dec admission.    Out of pocket Linzess cost is 615$ a month. Linzess 145 mcg works well for her. She would like samples of this.  No overt GI bleeding. She is wanting standing para orders, as it was difficult to get into the office in between admissions. Daughter is present with her today. She denies any mental status changes or confusion. No abdominal pain. No jaundice or pruritus.     Last EGD: 09/02/2021 - Grade I esophageal varices. - Portal hypertensive gastropathy. - Normal examined duodenum. - No specimens collected.   Last Colonoscopy: 2016 Normal mucosa of terminal ileum. Mild changes of melanosis coli involving sigmoid colon. Scarring noted at 20 cm from anal margin secondary to prior surgery.   Recommendations: Repeat EGD in 1  year.      Past Medical History:  Diagnosis Date   Anxiety    COPD (chronic obstructive pulmonary disease) (HCC)    Fibromyalgia    GERD (gastroesophageal reflux disease)    Hepatitis C    Hypertension    Hypothyroid    Migraines    MRSA (methicillin resistant staph aureus) culture positive     Past Surgical History:  Procedure Laterality Date   ABDOMINAL HYSTERECTOMY     partial   BACK SURGERY     lumbar removal of disc   BIOPSY  09/06/2014   Procedure: BIOPSY;  Surgeon: Rogene Houston, MD;  Location: [facility];  Service: Endoscopy;;   CHOLECYSTECTOMY     COLONOSCOPY WITH PROPOFOL N/A 09/06/2014   Procedure: COLONOSCOPY WITH PROPOFOL (Procedure #2) At cecum at 0819; total withdrawal time=13 minutes;  Surgeon: Rogene Houston, MD;  Location: [facility];  Service: Endoscopy;  Laterality: N/A;   ESOPHAGEAL DILATION N/A 09/06/2014   Procedure: ESOPHAGEAL DILATION WITH MALONEY DILATOR 56MM;  Surgeon: Rogene Houston, MD;  Location: [facility];  Service: Endoscopy;  Laterality: N/A;   ESOPHAGOGASTRODUODENOSCOPY (EGD) WITH PROPOFOL N/A 09/06/2014   Procedure: ESOPHAGOGASTRODUODENOSCOPY (EGD) WITH PROPOFOL (Procedure #1) ;  Surgeon: Rogene Houston, MD;  Location: [facility];  Service: Endoscopy;  Laterality: N/A;   ESOPHAGOGASTRODUODENOSCOPY (EGD) WITH PROPOFOL N/A 09/02/2021   Procedure: ESOPHAGOGASTRODUODENOSCOPY (EGD) WITH PROPOFOL;  Surgeon: Harvel Quale, MD;  Location: [facility];  Service: Gastroenterology;  Laterality: N/A;  830   HEMORRHOID SURGERY     Dr  Jenkins-2004   PARTIAL HYSTERECTOMY      One ovary left   RECTAL PROLAPSE REPAIR      Current Outpatient Medications  Medication Sig Dispense Refill   albuterol (PROVENTIL) (2.5 MG/3ML) 0.083% nebulizer solution Take 3 mLs (2.5 mg total) by nebulization every 2 (two) hours as needed for wheezing. 75 mL 5   albuterol (VENTOLIN HFA) 108 (90 Base) MCG/ACT inhaler Inhale 2 puffs into the lungs every 6 (six) hours as  needed for wheezing or shortness of breath. 18 g 2   famotidine (PEPCID) 20 MG tablet Take 20 mg by mouth 2 (two) times daily.     folic acid (FOLVITE) 1 MG tablet Take 1 tablet (1 mg total) by mouth daily. 30 tablet 3   furosemide (LASIX) 40 MG tablet Take 1 tablet (40 mg total) by mouth daily. (Patient taking differently: Take 20 mg by mouth daily.) 90 tablet 1   hydrOXYzine (ATARAX) 25 MG tablet Take 1 tablet (25 mg total) by mouth every 8 (eight) hours as needed for anxiety or nausea. 30 tablet 1   lactulose (CHRONULAC) 10 GM/15ML solution Take 15 mLs (10 g total) by mouth daily. Goal is to have 2 to 3 Mushy/Loose stools per Day 473 mL 2   levothyroxine (SYNTHROID) 88 MCG tablet Take 1 tablet (88 mcg total) by mouth daily before breakfast. 30 tablet 3   linaclotide (LINZESS) 145 MCG CAPS capsule Take 145 mcg by mouth daily before breakfast.     midodrine (PROAMATINE) 5 MG tablet Take 5 mg by mouth 3 (three) times daily with meals.     Multiple Vitamin (MULTIVITAMIN WITH MINERALS) TABS tablet Take 1 tablet by mouth daily. 30 tablet 1   Multiple Vitamins-Minerals (MULTIVITAMIN WITH MINERALS) tablet Take 1 tablet by mouth daily. 120 tablet 2   ondansetron (ZOFRAN) 4 MG tablet Take 1 tablet (4 mg total) by mouth every 6 (six) hours as needed for nausea. 20 tablet 0   pantoprazole (PROTONIX) 40 MG tablet Take 40 mg by mouth 2 (two) times daily.     spironolactone (ALDACTONE) 100 MG tablet Take 1 tablet (100 mg total) by mouth daily. (Patient taking differently: Take 50 mg by mouth daily.) 90 tablet 1   thiamine (VITAMIN B-1) 100 MG tablet Take 1 tablet (100 mg total) by mouth daily. 100 tablet 2   No current facility-administered medications for this visit.    Allergies as of 08/11/2022   (No Known Allergies)    Family History  Problem Relation Age of Onset   Colon cancer Neg Hx    Colon polyps Neg Hx     Social History   Socioeconomic History   Marital status: Widowed    Spouse name:  Not on file   Number of children: Not on file   Years of education: Not on file   Highest education level: Not on file  Occupational History   Not on file  Tobacco Use   Smoking status: Former    Packs/day: 0.25    Years: 30.00    Total pack years: 7.50    Types: Cigarettes   Smokeless tobacco: Never   Tobacco comments:    3-4 cigs a day since age 24.  Vaping Use   Vaping Use: Never used  Substance and Sexual Activity   Alcohol use: Not Currently    Comment: used to beer nightly not sure of how many   Drug use: No   Sexual activity: Yes    Birth control/protection: Surgical  Other Topics Concern   Not on file  Social History Narrative   Not on file   Social Determinants of Health   Financial Resource Strain: Not on file  Food Insecurity: Not on file  Transportation Needs: Not on file  Physical Activity: Not on file  Stress: Not on file  Social Connections: Not on file  Intimate Partner Violence: Not on file     Review of Systems   Gen: Denies any fever, chills, fatigue, weight loss, lack of appetite.  CV: Denies chest pain, heart palpitations, peripheral edema, syncope.  Resp: Denies shortness of breath at rest or with exertion. Denies wheezing or cough.  GI: see HPI GU : Denies urinary burning, urinary frequency, urinary hesitancy MS: Denies joint pain, muscle weakness, cramps, or limitation of movement.  Derm: Denies rash, itching, dry skin Psych: Denies depression, anxiety, memory loss, and confusion Heme: Denies bruising, bleeding, and enlarged lymph nodes.   Physical Exam   BP 107/62   Pulse 60   Temp 97.8 F (36.6 C)   Ht 5\' 3"  (1.6 m)   Wt 155 lb (70.3 kg)   BMI 27.46 kg/m  General:   Alert and oriented. Pleasant and cooperative. Well-nourished and well-developed.  Head:  Normocephalic and atraumatic. Eyes:  Without icterus Abdomen:  +BS, soft, non-tender and non-distended. No HSM noted. No guarding or rebound. No masses appreciated.  Rectal:   Deferred  Msk:  Symmetrical without gross deformities. Normal posture. Extremities:  Without edema. Neurologic:  Alert and  oriented x4;  grossly normal neurologically. Skin:  Intact without significant lesions or rashes. Psych:  Alert and cooperative. Normal mood and affect.   Assessment   Karla Young is a 62 y.o. female presenting today in follow-up with a history of decompensated liver cirrhosis secondary to hepatitis C (previously treated in 2022 and again in 2015 s/p eradication) and alcohol complicated by ascites and nonbleeding grade 1 esophageal varices, prior alcohol abuse, IBS, fibromyalgia, COPD, anxiety, hypertension, hypothyroidism, GERD and IBS. Here for hospital follow-up.   Cirrhosis: secondary to ETOH/prior Hep C now eradicated. Recently inpatient Dec 2023 at [facility] with ascites and AKI, undergoing para with 4 liters removed. Subsequently presented to [facility] with recurrent ascites and AKI, s/p additional 5 liters removed. MELD on 12/27 at outside facility was 28. Fortunately, she has stopped drinking since Thanksgiving. Last AFP in July 2023 was 6.1, EGD last in Jan 2023 with grade 1 esophageal varices and portal gastropathy. She is due for surveillance now and may ultimately need non-selective beta blocker if renal function allows. Update [place] now for screening purposes.   AKI: now improved with supportive measures after hospitalization. Diuretics now Lasix 20 mg and spironolactone 50 mg daily (down from 40 and 100). May need to titrate this up in future.   Ascites: s/p para X 2 as noted. Will check doppler ultrasound due to recent new onset ascites. Due to TIPS protocol, also referring for evaluation although MELD may be too high. She is requesting a standing order for para. I discussed would not do indefinite orders but will put 2 on file; however, she is to call us if any recurrent abdominal distension so we are aware. She was concerned about feasibility of appt due to prior  experience trying to be seen with recurrent abdominal distension.   History of liver lesions on CT in 2022: MRA Aug 2023 without suspicious enhancement. Needs AFP at next visit. [place] upcoming  Constipation: continue Linzess 145 mcg daily.  PLAN    RUQ [place] with doppler ultrasound Proceed with upper endoscopy by Dr. Cherlynn June in near future: the risks, benefits, and alternatives have been discussed with the patient in detail. The patient states understanding and desires to proceed. ASA 3  TIPS referral per protocol (suspect may not be candidate in light of recent MELD) Referral to Transplant for evaluation (patient and family requesting [place] location, [employer]) Nutrition referral 6 weeks follow-up, check AFP at next visit Continue absolute ETOH cessation   Annitta Needs, PhD, ANP-BC [facility] Gastroenterology    Addendum: pre-op labs with INR 2.2. Dr. Jenetta Downer aware.    I have reviewed the note and agree with the APP's assessment as described in this progress note  Will proceed with EGD for further evaluation of esophageal varices.  If presence of esophageal varices will not perform banding but may start nonselective beta-blockers.  Updated MELD 3.0  score was 23.  Given her high MELD score it is likely she would not be a candidate for TIPS.  May need intermittent paracentesis but can try increasing her furosemide and Aldactone, will discuss with the patient tomorrow prior to her esophagogastroduodenospy.  Maylon Peppers, MD Gastroenterology and Hepatology [facility] Gastroenterology

## 2022-08-11 NOTE — Progress Notes (Unsigned)
Gastroenterology Office Note     Primary Care Physician:  Leonie Douglas, MD  Primary Gastroenterologist: Dr. Jenetta Downer    Chief Complaint   Chief Complaint  Patient presents with   Follow-up    Pt here for hospital follow up     History of Present Illness   Karla Young is a 62 y.o. female presenting today in follow-up with a history of decompensated liver cirrhosis secondary to hepatitis C (previously treated in 2022 and again in 2015 s/p eradication) and alcoholism complicated by ascites and nonbleeding grade 1 esophageal varices, prior alcohol abuse, IBS, fibromyalgia, COPD, anxiety, hypertension, hypothyroidism, GERD and IBS. Here in hospital follow-up.Last ETOH last November.   She was inpatient at [facility] Dec 2023 with ascites. Underwent para with 4 liters removed.   At [facility] end of December 12/26-12/30 with recurrent abdominal distension, acute renal failure with creatinine 2.3 but improved to 0.93 on day of discharge.   Lasix 20 and spironolactone 50 mg daily (down from 40 and 100)    Out of pocket Linzess cost is 615$ a month. Linzess 145 mcg works well for her.     Last EGD: 09/02/2021 - Grade I esophageal varices. - Portal hypertensive gastropathy. - Normal examined duodenum. - No specimens collected.   Last Colonoscopy: 2016 Normal mucosa of terminal ileum. Mild changes of melanosis coli involving sigmoid colon. Scarring noted at 20 cm from anal margin secondary to prior surgery.   Recommendations: Repeat EGD in 1 year.      Past Medical History:  Diagnosis Date   Anxiety    COPD (chronic obstructive pulmonary disease) (HCC)    Fibromyalgia    GERD (gastroesophageal reflux disease)    Hepatitis C    Hypertension    Hypothyroid    Migraines    MRSA (methicillin resistant staph aureus) culture positive     Past Surgical History:  Procedure Laterality Date   ABDOMINAL HYSTERECTOMY     partial   BACK SURGERY     lumbar  removal of disc   BIOPSY  09/06/2014   Procedure: BIOPSY;  Surgeon: Rogene Houston, MD;  Location: [facility];  Service: Endoscopy;;   CHOLECYSTECTOMY     COLONOSCOPY WITH PROPOFOL N/A 09/06/2014   Procedure: COLONOSCOPY WITH PROPOFOL (Procedure #2) At cecum at 0819; total withdrawal time=13 minutes;  Surgeon: Rogene Houston, MD;  Location: [facility];  Service: Endoscopy;  Laterality: N/A;   ESOPHAGEAL DILATION N/A 09/06/2014   Procedure: ESOPHAGEAL DILATION WITH MALONEY DILATOR 56MM;  Surgeon: Rogene Houston, MD;  Location: [facility];  Service: Endoscopy;  Laterality: N/A;   ESOPHAGOGASTRODUODENOSCOPY (EGD) WITH PROPOFOL N/A 09/06/2014   Procedure: ESOPHAGOGASTRODUODENOSCOPY (EGD) WITH PROPOFOL (Procedure #1) ;  Surgeon: Rogene Houston, MD;  Location: [facility];  Service: Endoscopy;  Laterality: N/A;   ESOPHAGOGASTRODUODENOSCOPY (EGD) WITH PROPOFOL N/A 09/02/2021   Procedure: ESOPHAGOGASTRODUODENOSCOPY (EGD) WITH PROPOFOL;  Surgeon: Harvel Quale, MD;  Location: [facility];  Service: Gastroenterology;  Laterality: N/A;  [place]     Dr Raelyn Ensign   PARTIAL HYSTERECTOMY      One ovary left   RECTAL PROLAPSE REPAIR      Current Outpatient Medications  Medication Sig Dispense Refill   albuterol (PROVENTIL) (2.5 MG/3ML) 0.083% nebulizer solution Take 3 mLs (2.5 mg total) by nebulization every 2 (two) hours as needed for wheezing. 75 mL 5   albuterol (VENTOLIN HFA) 108 (90 Base) MCG/ACT inhaler Inhale 2 puffs  into the lungs every 6 (six) hours as needed for wheezing or shortness of breath. 18 g 2   famotidine (PEPCID) 20 MG tablet Take 20 mg by mouth 2 (two) times daily.     folic acid (FOLVITE) 1 MG tablet Take 1 tablet (1 mg total) by mouth daily. 30 tablet 3   furosemide (LASIX) 40 MG tablet Take 1 tablet (40 mg total) by mouth daily. (Patient taking differently: Take 20 mg by mouth daily.) 90 tablet 1   hydrOXYzine (ATARAX) 25 MG tablet Take 1 tablet (25 mg total) by  mouth every 8 (eight) hours as needed for anxiety or nausea. 30 tablet 1   lactulose (CHRONULAC) 10 GM/15ML solution Take 15 mLs (10 g total) by mouth daily. Goal is to have 2 to 3 Mushy/Loose stools per Day 473 mL 2   levothyroxine (SYNTHROID) 88 MCG tablet Take 1 tablet (88 mcg total) by mouth daily before breakfast. 30 tablet 3   linaclotide (LINZESS) 145 MCG CAPS capsule Take 145 mcg by mouth daily before breakfast.     midodrine (PROAMATINE) 5 MG tablet Take 5 mg by mouth 3 (three) times daily with meals.     Multiple Vitamin (MULTIVITAMIN WITH MINERALS) TABS tablet Take 1 tablet by mouth daily. 30 tablet 1   Multiple Vitamins-Minerals (MULTIVITAMIN WITH MINERALS) tablet Take 1 tablet by mouth daily. 120 tablet 2   ondansetron (ZOFRAN) 4 MG tablet Take 1 tablet (4 mg total) by mouth every 6 (six) hours as needed for nausea. 20 tablet 0   pantoprazole (PROTONIX) 40 MG tablet Take 40 mg by mouth 2 (two) times daily.     spironolactone (ALDACTONE) 100 MG tablet Take 1 tablet (100 mg total) by mouth daily. (Patient taking differently: Take 50 mg by mouth daily.) 90 tablet 1   thiamine (VITAMIN B-1) 100 MG tablet Take 1 tablet (100 mg total) by mouth daily. 100 tablet 2   No current facility-administered medications for this visit.    Allergies as of 08/11/2022   (No Known Allergies)    Family History  Problem Relation Age of Onset   Colon cancer Neg Hx    Colon polyps Neg Hx     Social History   Socioeconomic History   Marital status: Widowed    Spouse name: Not on file   Number of children: Not on file   Years of education: Not on file   Highest education level: Not on file  Occupational History   Not on file  Tobacco Use   Smoking status: Former    Packs/day: 0.25    Years: 30.00    Total pack years: 7.50    Types: Cigarettes   Smokeless tobacco: Never   Tobacco comments:    3-4 cigs a day since age 52.  Vaping Use   Vaping Use: Never used  Substance and Sexual  Activity   Alcohol use: Not Currently    Comment: used to beer nightly not sure of how many   Drug use: No   Sexual activity: Yes    Birth control/protection: Surgical  Other Topics Concern   Not on file  Social History Narrative   Not on file   Social Determinants of Health   Financial Resource Strain: Not on file  Food Insecurity: Not on file  Transportation Needs: Not on file  Physical Activity: Not on file  Stress: Not on file  Social Connections: Not on file  Intimate Partner Violence: Not on file  Review of Systems   Gen: Denies any fever, chills, fatigue, weight loss, lack of appetite.  CV: Denies chest pain, heart palpitations, peripheral edema, syncope.  Resp: Denies shortness of breath at rest or with exertion. Denies wheezing or cough.  GI: Denies dysphagia or odynophagia. Denies jaundice, hematemesis, fecal incontinence. GU : Denies urinary burning, urinary frequency, urinary hesitancy MS: Denies joint pain, muscle weakness, cramps, or limitation of movement.  Derm: Denies rash, itching, dry skin Psych: Denies depression, anxiety, memory loss, and confusion Heme: Denies bruising, bleeding, and enlarged lymph nodes.   Physical Exam   BP 107/62   Pulse 60   Temp 97.8 F (36.6 C)   Ht 5\' 3"  (1.6 m)   Wt 155 lb (70.3 kg)   BMI 27.46 kg/m  General:   Alert and oriented. Pleasant and cooperative. Well-nourished and well-developed.  Head:  Normocephalic and atraumatic. Eyes:  Without icterus Abdomen:  +BS, soft, non-tender and non-distended. No HSM noted. No guarding or rebound. No masses appreciated.  Rectal:  Deferred  Msk:  Symmetrical without gross deformities. Normal posture. Extremities:  Without edema. Neurologic:  Alert and  oriented x4;  grossly normal neurologically. Skin:  Intact without significant lesions or rashes. Psych:  Alert and cooperative. Normal mood and affect.   Assessment   Joliana Claflin is a 62 y.o. female presenting today in  follow-up with a history of    MELD 3.0 was 28 on 12/27 at outside facility.    PLAN   *****    1/28, PhD, ANP-BC [facility] Gastroenterology

## 2022-08-11 NOTE — Telephone Encounter (Signed)
Called pt. Advised her she will be in contacted from another office for her TIPS and nutrition referral. Also made aware of her US/doppler appt for 1/16, arrival 8:15am, npo midnight prior Provided # to CS for her 2 orders for PARA if she needs them. Advised [facility] does not do liver transplants in [place]. Advised of other locations that do and she stated she wants to hold off on referral for now since she was told she had to be clean from alcohol x 6 months. I made Vicente Males aware of this. EGD with Dr. Jenetta Downer, asa 3 scheduled for 1/25 at 1030am. Aware will call her back with her pre-op appt. Discussed EGD instructions in details.

## 2022-08-12 ENCOUNTER — Encounter (INDEPENDENT_AMBULATORY_CARE_PROVIDER_SITE_OTHER): Payer: Self-pay | Admitting: *Deleted

## 2022-08-13 DIAGNOSIS — K7460 Unspecified cirrhosis of liver: Secondary | ICD-10-CM

## 2022-08-16 NOTE — Telephone Encounter (Signed)
Tammy/Mindy:  Please refer to [facility] for liver transplant evaluation. Thanks!

## 2022-08-17 ENCOUNTER — Ambulatory Visit: Payer: Medicaid Other | Admitting: Gastroenterology

## 2022-08-17 NOTE — Addendum Note (Signed)
Addended by: Cheron Every on: 08/17/2022 10:21 AM   Modules accepted: Orders

## 2022-08-17 NOTE — Telephone Encounter (Signed)
Referral faxed

## 2022-08-24 ENCOUNTER — Telehealth: Payer: Self-pay

## 2022-08-24 ENCOUNTER — Ambulatory Visit (HOSPITAL_COMMUNITY): Admission: RE | Admit: 2022-08-24 | Payer: 59 | Source: Ambulatory Visit

## 2022-08-24 NOTE — Telephone Encounter (Signed)
Tammy: I put orders back in. Can you make sure we are good on that end? Thanks!

## 2022-08-24 NOTE — Telephone Encounter (Signed)
noted 

## 2022-08-24 NOTE — Telephone Encounter (Signed)
Spoke with Lexi and she stated that pt is scheduled for tomorrow.

## 2022-08-24 NOTE — Telephone Encounter (Signed)
Karla Young,  Lexi phoned from the hospital and stated this pt cancelled both her procedures this am. She talked to someone in radiology. Pt's daughter called back wanting the procedures rescheduled for tomorrow. Lexi needs the orders put back in so she can call the daughter back . Lexi's number is [phone]

## 2022-08-25 ENCOUNTER — Ambulatory Visit (HOSPITAL_COMMUNITY)
Admission: RE | Admit: 2022-08-25 | Discharge: 2022-08-25 | Disposition: A | Discharge status: Home / Self Care | Payer: 59 | Source: Ambulatory Visit | Attending: Gastroenterology | Admitting: Gastroenterology

## 2022-08-25 DIAGNOSIS — K7460 Unspecified cirrhosis of liver: Secondary | ICD-10-CM | POA: Diagnosis not present

## 2022-08-25 DIAGNOSIS — R188 Other ascites: Secondary | ICD-10-CM

## 2022-08-26 ENCOUNTER — Ambulatory Visit (INDEPENDENT_AMBULATORY_CARE_PROVIDER_SITE_OTHER): Payer: 59 | Admitting: Gastroenterology

## 2022-08-26 ENCOUNTER — Ambulatory Visit
Admission: RE | Admit: 2022-08-26 | Discharge: 2022-08-26 | Disposition: A | Discharge status: Home / Self Care | Payer: Medicaid Other | Source: Ambulatory Visit | Attending: Gastroenterology | Admitting: Gastroenterology

## 2022-08-26 ENCOUNTER — Encounter: Payer: 59 | Attending: Gastroenterology | Admitting: Nutrition

## 2022-08-26 VITALS — Ht 63.0 in | Wt 141.0 lb

## 2022-08-26 DIAGNOSIS — K582 Mixed irritable bowel syndrome: Secondary | ICD-10-CM

## 2022-08-26 DIAGNOSIS — R188 Other ascites: Secondary | ICD-10-CM | POA: Diagnosis not present

## 2022-08-26 DIAGNOSIS — K7460 Unspecified cirrhosis of liver: Secondary | ICD-10-CM | POA: Diagnosis not present

## 2022-08-26 DIAGNOSIS — I10 Essential (primary) hypertension: Secondary | ICD-10-CM | POA: Insufficient documentation

## 2022-08-26 DIAGNOSIS — Z713 Dietary counseling and surveillance: Secondary | ICD-10-CM | POA: Insufficient documentation

## 2022-08-26 HISTORY — PX: IR RADIOLOGIST EVAL & MGMT: IMG5224

## 2022-08-26 NOTE — Patient Instructions (Addendum)
Goals  Avoid canned foods and choose fresh fruits, vegetables or frozen for less sodium intake Eat three meals per day and 3 small snacks for improved nutrient intake B) 9 , L)12-2  D) 5-7 Increase fresh fruits, vegetables and dried beans, Whole grains. Keep a food journal

## 2022-08-26 NOTE — Progress Notes (Signed)
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 PMHY: Ascites, non bleeding grade 1 esophageal varices, IBS, Fibromyalgia, COPD, Anxiety, HTN, Hypothroididm, GERD. Last ETOH consumption was November 2023.  She notes she has had these liver issues and nutrition issues for a long time. Doesn't feel the need for this nutrition visit. Bowels are good with medications.  GI MD is Dr. Montez Morita. Urine is fine  Lost 6 lbs. Had a paracentesis done recently. Current diet is vague and she wasn't interested in discussing much about what she is eating. Current information shared reveals her calorie, protein and nutrient intake is insuffient to meet her nutritional needs nor improve  her malnutrition from her history of ETOH abuse and cirrhosis. She is taking MVI daily and thiamin according to medication list. She notes she is going through a lot of stress right now. Sleep varies.  Anthropometrics  Wt Readings from Last 3 Encounters:  08/26/22 141 lb (64 kg)  08/11/22 155 lb (70.3 kg)  07/21/22 164 lb 7.4 oz (74.6 kg)   Ht Readings from Last 3 Encounters:  08/26/22 '5\' 3"'$  (1.6 m)  08/11/22 '5\' 3"'$  (1.6 m)  05/25/22 '5\' 3"'$  (1.6 m)   Body mass index is 24.98 kg/m. '@BMIFA'$ @ Facility age limit for growth %iles is 20 years. Facility age limit for growth %iles is 20 years.    Clinical Medical Hx: See chart Medications: see char Labs:     Latest Ref Rng & Units 08/31/2022    8:30 AM 07/23/2022   11:17 AM 07/22/2022    3:35 AM  CMP  Glucose 70 - 99 mg/dL 112  92  108   BUN 8 - 23 mg/dL '8  23  28   '$ Creatinine 0.44 - 1.00 mg/dL 0.88  1.24  1.67   Sodium 135 - 145 mmol/L 137  136  135   Potassium 3.5 - 5.1  mmol/L 3.5  4.2  4.0   Chloride 98 - 111 mmol/L 104  99  98   CO2 22 - 32 mmol/L '25  29  30   '$ Calcium 8.9 - 10.3 mg/dL 9.5  9.2  9.7   Total Protein 6.5 - 8.1 g/dL 7.6  7.0    Total Bilirubin 0.3 - 1.2 mg/dL 6.8  6.1    Alkaline Phos 38 - 126 U/L 75  74    AST 15 - 41 U/L 80  80    ALT 0 - 44 U/L 47  42     Lipid Panel    Notable Signs/Symptoms: Denies any  Lifestyle & Dietary Hx LIves alone. Eats out and at home.  Estimated daily fluid intake: 60-80 oz Supplements: MVI, Thiamin Sleep: Varies; 6-8 hrs  Stress / self-care: No Current average weekly physical activity: ADL  24-Hr Dietary Recall First Meal: 11 am Eggs, water, yogurt Snack:  Second Meal: Depends; not usually Snack: apple Third Meal: Roast, potatoes, green beans, Snack: none Beverages: Water  Estimated Energy Needs Calories: 1800 Carbohydrate: 200g Protein: 135g Fat: 50g   NUTRITION DIAGNOSIS  NI-1.6 Predicted suboptional energy  As related to Cirrhosis and malnutrition.  As evidenced by on going weight loss, ascites and diet recall.   NUTRITION INTERVENTION  Nutrition education (E-1) on the following topics:  High Calorie High Protein Diet Cirrhosis Medical Nutrition Therapy  Handouts Provided Include  Mailed High Calorie High Protein Diet Nutrition Therapy for Cirrhosis.   Learning Style & Readiness for Change Teaching method utilized: Visual & Auditory  Demonstrated degree of understanding via: Teach Back  Barriers to learning/adherence to lifestyle change: Willingness to comply with recommendations.  Goals Established by Pt Goals  Avoid canned foods and choose fresh fruits, vegetables or frozen for less sodium intake Eat three meals per day and 3 small snacks for improved nutrient intake B) 9 , L)12-2  D) 5-7 Increase fresh fruits, vegetables and dried beans, Whole grains. Keep a food journal   MONITORING & EVALUATION Dietary intake, weekly physical activity, and weight I PRN  Next  Steps  Patient is to work on eating more consistent balanced meals.Marland Kitchen

## 2022-08-26 NOTE — Consult Note (Signed)
Chief Complaint: Patient was seen in virtual telephone consultation today for recurrent ascites in the setting of cirrhosis  Referring Physician(s): Roseanne Kaufman W   History of Present Illness: Karla Young is a 62 y.o. female with history of alcoholic/hepatitis C cirrhosis and recurrent ascites.  She was first diagnosed with cirrhosis in in 2022, and had her first paracentesis in December of 2022.  She has been managed with diuretics, however recently has suffered some renal insufficiency so her diuretics have been reduced, now taking only furosemide 20 mg QD and spironolactone 50 mg QD.  Her care has been haphazard between [facility] and [facility] providers, but she has recently settled on receiving care with the [facility].  Over the last month she had a paracentesis on December 14th yielding 4 L and again earlier this month at an outside facility, reporting 5 L drained.  She has bene attempting to adhere to a low sodium diet.    She denies history of upper or lower GI bleeding.  Prior EGD on 09/02/21 demonstrated grade I esophageal varices and portal hypertensive gastropathy.  She denies any prior hepatic encephalopathy, however takes lactulose.  She has remained sober since Thanksgiving 2023 when she also gave up smoking.    She has a forthcoming appointment with [facility] for consideration of transplant workup in March.    She reports a history of having a heart murmur, but has never had an echocardiogram.    Past Medical History:  Diagnosis Date   Anxiety    COPD (chronic obstructive pulmonary disease) (HCC)    Fibromyalgia    GERD (gastroesophageal reflux disease)    Hepatitis C    Hypertension    Hypothyroid    Migraines    MRSA (methicillin resistant staph aureus) culture positive     Past Surgical History:  Procedure Laterality Date   ABDOMINAL HYSTERECTOMY     partial   BACK SURGERY     lumbar removal of disc   BIOPSY  09/06/2014   Procedure: BIOPSY;   Surgeon: Rogene Houston, MD;  Location: [facility];  Service: Endoscopy;;   CHOLECYSTECTOMY     COLONOSCOPY WITH PROPOFOL N/A 09/06/2014   Procedure: COLONOSCOPY WITH PROPOFOL (Procedure #2) At cecum at 0819; total withdrawal time=13 minutes;  Surgeon: Rogene Houston, MD;  Location: [facility];  Service: Endoscopy;  Laterality: N/A;   ESOPHAGEAL DILATION N/A 09/06/2014   Procedure: ESOPHAGEAL DILATION WITH MALONEY DILATOR 56MM;  Surgeon: Rogene Houston, MD;  Location: [facility];  Service: Endoscopy;  Laterality: N/A;   ESOPHAGOGASTRODUODENOSCOPY (EGD) WITH PROPOFOL N/A 09/06/2014   Procedure: ESOPHAGOGASTRODUODENOSCOPY (EGD) WITH PROPOFOL (Procedure #1) ;  Surgeon: Rogene Houston, MD;  Location: [facility];  Service: Endoscopy;  Laterality: N/A;   ESOPHAGOGASTRODUODENOSCOPY (EGD) WITH PROPOFOL N/A 09/02/2021   Procedure: ESOPHAGOGASTRODUODENOSCOPY (EGD) WITH PROPOFOL;  Surgeon: Harvel Quale, MD;  Location: [facility];  Service: Gastroenterology;  Laterality: N/A;  [place]     Dr Raelyn Ensign   PARTIAL HYSTERECTOMY      One ovary left   RECTAL PROLAPSE REPAIR      Allergies: Patient has no known allergies.  Medications: Prior to Admission medications   Medication Sig Start Date End Date Taking? Authorizing Provider  albuterol (PROVENTIL) (2.5 MG/3ML) 0.083% nebulizer solution Take 3 mLs (2.5 mg total) by nebulization every 2 (two) hours as needed for wheezing. 07/23/22   Roxan Hockey, MD  albuterol (VENTOLIN HFA) 108 (90 Base) MCG/ACT inhaler Inhale 2  puffs into the lungs every 6 (six) hours as needed for wheezing or shortness of breath. 07/23/22   Roxan Hockey, MD  famotidine (PEPCID) 20 MG tablet Take 20 mg by mouth at bedtime.    [provider]  folic acid (FOLVITE) 1 MG tablet Take 1 tablet (1 mg total) by mouth daily. 07/24/22   Roxan Hockey, MD  furosemide (LASIX) 40 MG tablet Take 1 tablet (40 mg total) by mouth daily. Patient taking  differently: Take 20 mg by mouth daily. 07/23/22   Roxan Hockey, MD  hydrOXYzine (ATARAX) 25 MG tablet Take 1 tablet (25 mg total) by mouth every 8 (eight) hours as needed for anxiety or nausea. 07/23/22   Roxan Hockey, MD  lactulose (CHRONULAC) 10 GM/15ML solution Take 15 mLs (10 g total) by mouth daily. Goal is to have 2 to 3 Mushy/Loose stools per Day 07/23/22   Roxan Hockey, MD  levothyroxine (SYNTHROID) 88 MCG tablet Take 1 tablet (88 mcg total) by mouth daily before breakfast. 07/23/22   Roxan Hockey, MD  linaclotide (LINZESS) 145 MCG CAPS capsule Take 145 mcg by mouth daily before breakfast.    [provider]  midodrine (PROAMATINE) 5 MG tablet Take 5 mg by mouth 3 (three) times daily with meals.    [provider]  Multiple Vitamins-Minerals (MULTIVITAMIN WITH MINERALS) tablet Take 1 tablet by mouth daily. 07/23/22 07/23/23  Roxan Hockey, MD  ondansetron (ZOFRAN) 4 MG tablet Take 1 tablet (4 mg total) by mouth every 6 (six) hours as needed for nausea. 07/23/22   Roxan Hockey, MD  pantoprazole (PROTONIX) 40 MG tablet Take 40 mg by mouth 2 (two) times daily.    [provider]  spironolactone (ALDACTONE) 100 MG tablet Take 1 tablet (100 mg total) by mouth daily. Patient taking differently: Take 50 mg by mouth daily. 07/23/22   Roxan Hockey, MD  thiamine (VITAMIN B-1) 100 MG tablet Take 1 tablet (100 mg total) by mouth daily. 07/24/22   Roxan Hockey, MD     Family History  Problem Relation Age of Onset   Colon cancer Neg Hx    Colon polyps Neg Hx     Social History   Socioeconomic History   Marital status: Widowed    Spouse name: Not on file   Number of children: Not on file   Years of education: Not on file   Highest education level: Not on file  Occupational History   Not on file  Tobacco Use   Smoking status: Former    Packs/day: 0.25    Years: 30.00    Total pack years: 7.50    Types: Cigarettes   Smokeless  tobacco: Never   Tobacco comments:    3-4 cigs a day since age 58.  Vaping Use   Vaping Use: Never used  Substance and Sexual Activity   Alcohol use: Not Currently    Comment: used to beer nightly not sure of how many   Drug use: No   Sexual activity: Yes    Birth control/protection: Surgical  Other Topics Concern   Not on file  Social History Narrative   Not on file   Social Determinants of Health   Financial Resource Strain: Not on file  Food Insecurity: Not on file  Transportation Needs: Not on file  Physical Activity: Not on file  Stress: Not on file  Social Connections: Not on file     Review of Systems: A 12 point ROS discussed and pertinent positives are indicated  in the HPI above.  All other systems are negative.   Vital Signs: There were no vitals taken for this visit.  No physical examination was performed in lieu of virtual telephone clinic visit.   Imaging: MR abdomen 03/16/22  Patent portal system, ascites.  No evidence of significant varices.  Favorable anatomy for TIPS creation.  Labs:  No recent labs.  CBC: Recent Labs    02/23/22 0746 07/21/22 1456 07/22/22 0335 07/23/22 1117  WBC 5.8 6.6 6.9 6.4  HGB 12.0 10.4* 8.9* 10.7*  HCT 33.1* 31.9* 26.7* 32.3*  PLT 78* 71* 65* 67*    COAGS: Recent Labs    08/27/21 0807 02/23/22 0746 07/21/22 1455 07/23/22 1117  INR 1.3* 1.9* 2.1* 2.1*  APTT  --   --  47*  --     BMP: Recent Labs    03/23/22 0725 07/21/22 1456 07/22/22 0335 07/23/22 1117  NA 135 133* 135 136  K 3.6 3.5 4.0 4.2  CL 98 96* 98 99  CO2 26 28 30 29   GLUCOSE 77 138* 108* 92  BUN 6* 26* 28* 23  CALCIUM 9.1 9.9 9.7 9.2  CREATININE 0.67 1.70* 1.67* 1.24*  GFRNONAA  --  34* 35* 50*    LIVER FUNCTION TESTS: Recent Labs    08/27/21 0807 02/23/22 0746 07/21/22 1456 07/23/22 1117  BILITOT 1.7* 5.0* 6.1* 6.1*  AST 166* 74* 71* 80*  ALT 77* 40* 38 42  ALKPHOS  --   --  81 74  PROT 7.4 7.0 6.7 7.0  ALBUMIN  --    --  2.4* 2.6*    TUMOR MARKERS: Recent Labs    02/23/22 0746  AFPTM 6.1*    Assessment and Plan: 62 year old female with history of HCV/EtOH cirrhosis (no recent labs to calculate MELD/CP scores) and recurrent ascites with limited diuretic use due to worsening renal failure.  We discussed the natural history of cirrhosis and portal hypertension in addition to periprocedural expectations and procedural details of TIPS creation.  She is interested in pursuing TIPS for ascites control.    -obtain CBC, CMP, and INR, she prefers to go to [facility] for labs -obtain echocardiogram  Once these are obtained, I will review data and we can discuss candidacy again for TIPS creation.  Thank you for this interesting consult.  I greatly enjoyed meeting Roshanda Balazs and look forward to participating in their care.  A copy of this report was sent to the requesting provider on this date.  Electronically Signed: [name], MD 08/26/2022, 11:52 AM   I spent a total of 40 Minutes  in virtual telephone clinical consultation, greater than 50% of which was counseling/coordinating care for portal hypertension.

## 2022-08-27 ENCOUNTER — Other Ambulatory Visit: Payer: Self-pay | Admitting: *Deleted

## 2022-08-27 ENCOUNTER — Other Ambulatory Visit: Payer: Self-pay | Admitting: Interventional Radiology

## 2022-08-27 DIAGNOSIS — Z01810 Encounter for preprocedural cardiovascular examination: Secondary | ICD-10-CM

## 2022-08-27 DIAGNOSIS — K7031 Alcoholic cirrhosis of liver with ascites: Secondary | ICD-10-CM

## 2022-08-27 NOTE — Patient Instructions (Signed)
Karla Young  08/27/2022     @PREFPERIOPPHARMACY @   Your procedure is scheduled on  09/02/2022.   Report to [facility] at  0830  A.M.   Call this number if you have problems the morning of surgery:  [phone]  If you experience any cold or flu symptoms such as cough, fever, chills, shortness of breath, etc. between now and your scheduled surgery, please notify us at the above number.   Remember:  Follow the diet instructions given to you by the office.       Use your nebulizer and your inhaler before you come and bring your rescue inhaler with you.     Take these medicines the morning of surgery with A SIP OF WATER   hydroxyzine(if needed), levothyroxine, zofran (if needed), protonix.     Do not wear jewelry, make-up or nail polish.  Do not wear lotions, powders, or perfumes, or deodorant.  Do not shave 48 hours prior to surgery.  Men may shave face and neck.  Do not bring valuables to the hospital.  [facility] is not responsible for any belongings or valuables.  Contacts, dentures or bridgework may not be worn into surgery.  Leave your suitcase in the car.  After surgery it may be brought to your room.  For patients admitted to the hospital, discharge time will be determined by your treatment team.  Patients discharged the day of surgery will not be allowed to drive home and must have someone with them for 24 hours.    Special instructions:   DO NOT smoke tobacco or vape for 24 hours before your procedure.  Please read over the following fact sheets that you were given. Anesthesia Post-op Instructions and Care and Recovery After Surgery      Upper Endoscopy, Adult, Care After After the procedure, it is common to have a sore throat. It is also common to have: Mild stomach pain or discomfort. Bloating. Nausea. Follow these instructions at home: The instructions below may help you care for yourself at home. Your health care provider may give you more  instructions. If you have questions, ask your health care provider. If you were given a sedative during the procedure, it can affect you for several hours. Do not drive or operate machinery until your health care provider says that it is safe. If you will be going home right after the procedure, plan to have a responsible adult: Take you home from the hospital or clinic. You will not be allowed to drive. Care for you for the time you are told. Follow instructions from your health care provider about what you may eat and drink. Return to your normal activities as told by your health care provider. Ask your health care provider what activities are safe for you. Take over-the-counter and prescription medicines only as told by your health care provider. Contact a health care provider if you: Have a sore throat that lasts longer than one day. Have trouble swallowing. Have a fever. Get help right away if you: Vomit blood or your vomit looks like coffee grounds. Have bloody, black, or tarry stools. Have a very bad sore throat or you cannot swallow. Have difficulty breathing or very bad pain in your chest or abdomen. These symptoms may be an emergency. Get help right away. Call 911. Do not wait to see if the symptoms will go away. Do not drive yourself to the hospital. Summary After the procedure, it is common  to have a sore throat, mild stomach discomfort, bloating, and nausea. If you were given a sedative during the procedure, it can affect you for several hours. Do not drive until your health care provider says that it is safe. Follow instructions from your health care provider about what you may eat and drink. Return to your normal activities as told by your health care provider. This information is not intended to replace advice given to you by your health care provider. Make sure you discuss any questions you have with your health care provider. Document Revised: 11/04/2021 Document Reviewed:  11/04/2021 Elsevier Patient Education  [place] After The following information offers guidance on how to care for yourself after your procedure. Your health care provider may also give you more specific instructions. If you have problems or questions, contact your health care provider. What can I expect after the procedure? After the procedure, it is common to have: Tiredness. Little or no memory about what happened during or after the procedure. Impaired judgment when it comes to making decisions. Nausea or vomiting. Some trouble with balance. Follow these instructions at home: For the time period you were told by your health care provider:  Rest. Do not participate in activities where you could fall or become injured. Do not drive or use machinery. Do not drink alcohol. Do not take sleeping pills or medicines that cause drowsiness. Do not make important decisions or sign legal documents. Do not take care of children on your own. Medicines Take over-the-counter and prescription medicines only as told by your health care provider. If you were prescribed antibiotics, take them as told by your health care provider. Do not stop using the antibiotic even if you start to feel better. Eating and drinking Follow instructions from your health care provider about what you may eat and drink. Drink enough fluid to keep your urine pale yellow. If you vomit: Drink clear fluids slowly and in small amounts as you are able. Clear fluids include water, ice chips, low-calorie sports drinks, and fruit juice that has water added to it (diluted fruit juice). Eat light and bland foods in small amounts as you are able. These foods include bananas, applesauce, rice, lean meats, toast, and crackers. General instructions  Have a responsible adult stay with you for the time you are told. It is important to have someone help care for you until you are awake and  alert. If you have sleep apnea, surgery and some medicines can increase your risk for breathing problems. Follow instructions from your health care provider about wearing your sleep device: When you are sleeping. This includes during daytime naps. While taking prescription pain medicines, sleeping medicines, or medicines that make you drowsy. Do not use any products that contain nicotine or tobacco. These products include cigarettes, chewing tobacco, and vaping devices, such as e-cigarettes. If you need help quitting, ask your health care provider. Contact a health care provider if: You feel nauseous or vomit every time you eat or drink. You feel light-headed. You are still sleepy or having trouble with balance after 24 hours. You get a rash. You have a fever. You have redness or swelling around the IV site. Get help right away if: You have trouble breathing. You have new confusion after you get home. These symptoms may be an emergency. Get help right away. Call 911. Do not wait to see if the symptoms will go away. Do not drive yourself to the hospital. This  information is not intended to replace advice given to you by your health care provider. Make sure you discuss any questions you have with your health care provider. Document Revised: 12/21/2021 Document Reviewed: 12/21/2021 Elsevier Patient Education  [place].

## 2022-08-30 ENCOUNTER — Telehealth (INDEPENDENT_AMBULATORY_CARE_PROVIDER_SITE_OTHER): Payer: Self-pay | Admitting: *Deleted

## 2022-08-30 ENCOUNTER — Other Ambulatory Visit: Payer: Self-pay | Admitting: Interventional Radiology

## 2022-08-30 DIAGNOSIS — R188 Other ascites: Secondary | ICD-10-CM

## 2022-08-30 DIAGNOSIS — Z01810 Encounter for preprocedural cardiovascular examination: Secondary | ICD-10-CM

## 2022-08-30 MED ORDER — ONDANSETRON HCL 4 MG PO TABS: 4.0000 mg | ORAL_TABLET | Freq: Four times a day (QID) | ORAL | 5 refills | Status: DC | PRN

## 2022-08-30 NOTE — Telephone Encounter (Signed)
I refilled Zofran. Thanks!

## 2022-08-30 NOTE — Telephone Encounter (Signed)
Fa from [facility] requesting refill on ondansetron 4mg  tab #20 one every 6 hours prn. This was prescribed by Dr. Joesph Fillers in hospital. She was last seen by you for cirrhosis on 08/11/22 and has upcoming appt with you on 09/23/22

## 2022-08-30 NOTE — Telephone Encounter (Signed)
See mychart.  

## 2022-08-31 ENCOUNTER — Encounter (HOSPITAL_COMMUNITY)
Admission: RE | Admit: 2022-08-31 | Discharge: 2022-08-31 | Disposition: A | Discharge status: Home / Self Care | Payer: 59 | Source: Ambulatory Visit | Attending: Gastroenterology | Admitting: Gastroenterology

## 2022-08-31 ENCOUNTER — Encounter (HOSPITAL_COMMUNITY): Payer: Self-pay

## 2022-08-31 VITALS — BP 134/73 | HR 76 | Temp 97.8°F | Resp 18 | Ht 63.0 in | Wt 141.0 lb

## 2022-08-31 DIAGNOSIS — K7031 Alcoholic cirrhosis of liver with ascites: Secondary | ICD-10-CM | POA: Insufficient documentation

## 2022-08-31 DIAGNOSIS — Z01812 Encounter for preprocedural laboratory examination: Secondary | ICD-10-CM | POA: Insufficient documentation

## 2022-08-31 DIAGNOSIS — R69 Illness, unspecified: Secondary | ICD-10-CM | POA: Diagnosis not present

## 2022-08-31 HISTORY — DX: Hypotension, unspecified: I95.9

## 2022-08-31 HISTORY — DX: Unspecified cirrhosis of liver: K74.60

## 2022-08-31 HISTORY — DX: Other specified postprocedural states: Z98.890

## 2022-08-31 HISTORY — DX: Cardiac murmur, unspecified: R01.1

## 2022-08-31 HISTORY — DX: Nausea with vomiting, unspecified: R11.2

## 2022-08-31 LAB — CBC WITH DIFFERENTIAL/PLATELET
Abs Immature Granulocytes: 0.03 10*3/uL (ref 0.00–0.07)
Basophils Absolute: 0.1 10*3/uL (ref 0.0–0.1)
Basophils Relative: 2 %
Eosinophils Absolute: 0.2 10*3/uL (ref 0.0–0.5)
Eosinophils Relative: 4 %
HCT: 31.5 % — ABNORMAL LOW (ref 36.0–46.0)
Hemoglobin: 10.5 g/dL — ABNORMAL LOW (ref 12.0–15.0)
Immature Granulocytes: 1 %
Lymphocytes Relative: 25 %
Lymphs Abs: 1.3 10*3/uL (ref 0.7–4.0)
MCH: 30.7 pg (ref 26.0–34.0)
MCHC: 33.3 g/dL (ref 30.0–36.0)
MCV: 92.1 fL (ref 80.0–100.0)
Monocytes Absolute: 0.6 10*3/uL (ref 0.1–1.0)
Monocytes Relative: 12 %
Neutro Abs: 3 10*3/uL (ref 1.7–7.7)
Neutrophils Relative %: 56 %
Platelets: 79 10*3/uL — ABNORMAL LOW (ref 150–400)
RBC: 3.42 MIL/uL — ABNORMAL LOW (ref 3.87–5.11)
RDW: 16.9 % — ABNORMAL HIGH (ref 11.5–15.5)
WBC: 5.2 10*3/uL (ref 4.0–10.5)
nRBC: 0 % (ref 0.0–0.2)

## 2022-08-31 LAB — COMPREHENSIVE METABOLIC PANEL
ALT: 47 U/L — ABNORMAL HIGH (ref 0–44)
AST: 80 U/L — ABNORMAL HIGH (ref 15–41)
Albumin: 3.7 g/dL (ref 3.5–5.0)
Alkaline Phosphatase: 75 U/L (ref 38–126)
Anion gap: 8 (ref 5–15)
BUN: 8 mg/dL (ref 8–23)
CO2: 25 mmol/L (ref 22–32)
Calcium: 9.5 mg/dL (ref 8.9–10.3)
Chloride: 104 mmol/L (ref 98–111)
Creatinine, Ser: 0.88 mg/dL (ref 0.44–1.00)
GFR, Estimated: >60 mL/min (ref >60)
Glucose, Bld: 112 mg/dL — ABNORMAL HIGH (ref 70–99)
Potassium: 3.5 mmol/L (ref 3.5–5.1)
Sodium: 137 mmol/L (ref 135–145)
Total Bilirubin: 6.8 mg/dL — ABNORMAL HIGH (ref 0.3–1.2)
Total Protein: 7.6 g/dL (ref 6.5–8.1)

## 2022-08-31 LAB — PROTIME-INR
INR: 2.2 — ABNORMAL HIGH (ref 0.8–1.2)
Prothrombin Time: 24.1 seconds — ABNORMAL HIGH (ref 11.4–15.2)

## 2022-08-31 NOTE — Telephone Encounter (Signed)
Mandy, can we get patient in for an appt, any APP in next 2 weeks? Thanks.

## 2022-09-01 NOTE — Pre-Procedure Instructions (Signed)
  RE: PT/INR Received: Today Montez Morita, Quillian Quince, MD  Encarnacion Chu, RN; [place], Wayne Both, CMA; Madelin Rear, LPN Hi, Yeah I received the message from her and [facility] with the INR, we will not band her but if she has varices start her on beta blockers, so no aggressive interventions. Good to go! Thanks       Previous Messages    ----- Message ----- From: Encarnacion Chu, RN Sent: 09/01/2022  10:40 AM EST To: Madelin Rear, LPN; Cheron Every, CMA; * Subject: PT/INR                                        Good morning to you all! Olivia Mackie said she notified Dr Jenetta Downer of Pt/ INR yesterday and she is not here today. Dr Charna Elizabeth wanted me to double check with you Dr Jenetta Downer to make sure you received that and were okay with proceeding without further intervention. Her results were 24.1/2.0. Thank you!

## 2022-09-01 NOTE — Pre-Procedure Instructions (Signed)
Messaged Dr Jenetta Downer about PT/INR.

## 2022-09-02 ENCOUNTER — Encounter (HOSPITAL_COMMUNITY): Payer: Self-pay | Admitting: Gastroenterology

## 2022-09-02 ENCOUNTER — Ambulatory Visit (HOSPITAL_COMMUNITY): Payer: 59 | Admitting: Anesthesiology

## 2022-09-02 ENCOUNTER — Ambulatory Visit (HOSPITAL_COMMUNITY)
Admission: RE | Admit: 2022-09-02 | Discharge: 2022-09-02 | Disposition: A | Discharge status: Home / Self Care | Payer: 59 | Attending: Gastroenterology | Admitting: Gastroenterology

## 2022-09-02 ENCOUNTER — Ambulatory Visit (HOSPITAL_BASED_OUTPATIENT_CLINIC_OR_DEPARTMENT_OTHER): Payer: 59 | Admitting: Anesthesiology

## 2022-09-02 ENCOUNTER — Encounter (HOSPITAL_COMMUNITY)
Admission: RE | Disposition: A | Discharge status: Home / Self Care | Payer: Self-pay | Source: Home / Self Care | Attending: Gastroenterology

## 2022-09-02 DIAGNOSIS — Z79899 Other long term (current) drug therapy: Secondary | ICD-10-CM | POA: Insufficient documentation

## 2022-09-02 DIAGNOSIS — K766 Portal hypertension: Secondary | ICD-10-CM | POA: Diagnosis not present

## 2022-09-02 DIAGNOSIS — K7031 Alcoholic cirrhosis of liver with ascites: Secondary | ICD-10-CM

## 2022-09-02 DIAGNOSIS — K219 Gastro-esophageal reflux disease without esophagitis: Secondary | ICD-10-CM | POA: Insufficient documentation

## 2022-09-02 DIAGNOSIS — K581 Irritable bowel syndrome with constipation: Secondary | ICD-10-CM | POA: Insufficient documentation

## 2022-09-02 DIAGNOSIS — Z87891 Personal history of nicotine dependence: Secondary | ICD-10-CM | POA: Insufficient documentation

## 2022-09-02 DIAGNOSIS — K7030 Alcoholic cirrhosis of liver without ascites: Secondary | ICD-10-CM | POA: Insufficient documentation

## 2022-09-02 DIAGNOSIS — K3189 Other diseases of stomach and duodenum: Secondary | ICD-10-CM

## 2022-09-02 DIAGNOSIS — K7689 Other specified diseases of liver: Secondary | ICD-10-CM | POA: Diagnosis not present

## 2022-09-02 DIAGNOSIS — I8510 Secondary esophageal varices without bleeding: Secondary | ICD-10-CM

## 2022-09-02 DIAGNOSIS — F1020 Alcohol dependence, uncomplicated: Secondary | ICD-10-CM | POA: Insufficient documentation

## 2022-09-02 DIAGNOSIS — R69 Illness, unspecified: Secondary | ICD-10-CM | POA: Diagnosis not present

## 2022-09-02 DIAGNOSIS — J449 Chronic obstructive pulmonary disease, unspecified: Secondary | ICD-10-CM | POA: Diagnosis not present

## 2022-09-02 DIAGNOSIS — E039 Hypothyroidism, unspecified: Secondary | ICD-10-CM | POA: Insufficient documentation

## 2022-09-02 DIAGNOSIS — I10 Essential (primary) hypertension: Secondary | ICD-10-CM | POA: Diagnosis not present

## 2022-09-02 DIAGNOSIS — Z8619 Personal history of other infectious and parasitic diseases: Secondary | ICD-10-CM | POA: Insufficient documentation

## 2022-09-02 DIAGNOSIS — I8500 Esophageal varices without bleeding: Secondary | ICD-10-CM

## 2022-09-02 DIAGNOSIS — K7460 Unspecified cirrhosis of liver: Secondary | ICD-10-CM

## 2022-09-02 HISTORY — PX: ESOPHAGOGASTRODUODENOSCOPY (EGD) WITH PROPOFOL: SHX5813

## 2022-09-02 SURGERY — ESOPHAGOGASTRODUODENOSCOPY (EGD) WITH PROPOFOL
Anesthesia: General

## 2022-09-02 MED ORDER — FUROSEMIDE 40 MG PO TABS: 20.0000 mg | ORAL_TABLET | Freq: Every day | ORAL | 1 refills | Status: DC

## 2022-09-02 MED ORDER — LACTATED RINGERS IV SOLN
INTRAVENOUS | Status: DC
  Administered 2022-09-02: 1000 mL via INTRAVENOUS

## 2022-09-02 MED ORDER — PROPOFOL 10 MG/ML IV BOLUS
INTRAVENOUS | Status: DC | PRN
  Administered 2022-09-02: 50 mg via INTRAVENOUS
  Administered 2022-09-02: 100 mg via INTRAVENOUS
  Administered 2022-09-02: 50 mg via INTRAVENOUS

## 2022-09-02 MED ORDER — SPIRONOLACTONE 100 MG PO TABS: 50.0000 mg | ORAL_TABLET | Freq: Every day | ORAL | 1 refills | Status: DC

## 2022-09-02 MED ORDER — PROPOFOL 500 MG/50ML IV EMUL: INTRAVENOUS | Status: DC | PRN

## 2022-09-02 MED ORDER — LIDOCAINE HCL 1 % IJ SOLN
INTRAMUSCULAR | Status: DC | PRN
  Administered 2022-09-02: 50 mg via INTRADERMAL

## 2022-09-02 NOTE — Interval H&P Note (Signed)
History and Physical Interval Note:  09/02/2022 8:57 AM  Karla Young  has presented today for surgery, with the diagnosis of VARICEAL SCREENING.  The various methods of treatment have been discussed with the patient and family. After consideration of risks, benefits and other options for treatment, the patient has consented to  Procedure(s) with comments: ESOPHAGOGASTRODUODENOSCOPY (EGD) WITH PROPOFOL (N/A) - 1030am, asa 3 as a surgical intervention.  The patient's history has been reviewed, patient examined, no change in status, stable for surgery.  I have reviewed the patient's chart and labs.  Questions were answered to the patient's satisfaction.     Maylon Peppers Mayorga

## 2022-09-02 NOTE — Anesthesia Postprocedure Evaluation (Signed)
Anesthesia Post Note  Patient: Scientist, forensic  Procedure(s) Performed: ESOPHAGOGASTRODUODENOSCOPY (EGD) WITH PROPOFOL  Patient location during evaluation: Short Stay Anesthesia Type: General Level of consciousness: awake and alert Pain management: pain level controlled Vital Signs Assessment: post-procedure vital signs reviewed and stable Respiratory status: spontaneous breathing Cardiovascular status: blood pressure returned to baseline and stable Postop Assessment: no apparent nausea or vomiting Anesthetic complications: no   No notable events documented.   Last Vitals:  Vitals:   09/02/22 0917 09/02/22 1056  BP: 127/74 (!) 90/54  Pulse:  85  Resp: 14 16  Temp: 36.9 C 36.8 C  SpO2: 100% 98%    Last Pain:  Vitals:   09/02/22 1056  TempSrc: Oral  PainSc: 0-No pain                 Yarixa Lightcap

## 2022-09-02 NOTE — Transfer of Care (Signed)
Immediate Anesthesia Transfer of Care Note  Patient: Karla Young  Procedure(s) Performed: ESOPHAGOGASTRODUODENOSCOPY (EGD) WITH PROPOFOL  Patient Location: Short Stay  Anesthesia Type:General  Level of Consciousness: awake  Airway & Oxygen Therapy: Patient Spontanous Breathing  Post-op Assessment: Report given to RN  Post vital signs: Reviewed and stable  Last Vitals:  Vitals Value Taken Time  BP 90/54 09/02/22 1056  Temp 36.8 C 09/02/22 1056  Pulse 85 09/02/22 1056  Resp 16 09/02/22 1056  SpO2 98 % 09/02/22 1056    Last Pain:  Vitals:   09/02/22 1056  TempSrc: Oral  PainSc: 0-No pain      Patients Stated Pain Goal: 8 (46/27/03 5009)  Complications: No notable events documented.

## 2022-09-02 NOTE — Op Note (Addendum)
[facility] Patient Name: Arpita Fentress Procedure Date: 09/02/2022 10:23 AM MRN: [id number] Date of Birth: 03/29/1961 Attending MD: [name] , , [id number] CSN: [id number] Age: 62 Admit Type: Outpatient Procedure:                Upper GI endoscopy Indications:              Follow-up of esophageal varices Providers:                [name], Sheran Fava, Dyann Ruddle Referring MD:              Medicines:                Monitored Anesthesia Care Complications:            No immediate complications. Estimated Blood Loss:     Estimated blood loss: none. Procedure:                Pre-Anesthesia Assessment:                           - Prior to the procedure, a History and Physical                            was performed, and patient medications, allergies                            and sensitivities were reviewed. The patient's                            tolerance of previous anesthesia was reviewed.                           - The risks and benefits of the procedure and the                            sedation options and risks were discussed with the                            patient. All questions were answered and informed                            consent was obtained.                           After obtaining informed consent, the endoscope was                            passed under direct vision. Throughout the                            procedure, the patient's blood pressure, pulse, and                            oxygen saturations were monitored continuously. The                            [id number]-[id number] ([id number])  scope was introduced through the                            mouth, and advanced to the second part of duodenum.                            The upper GI endoscopy was accomplished without                            difficulty. The patient tolerated the procedure                            well. Scope In: 10:44:21 AM Scope Out: 10:48:23 AM Total  Procedure Duration: 0 hours 4 minutes 2 seconds  Findings:      Grade I varices were found in the lower third of the esophagus.      Moderate portal hypertensive gastropathy was found in the entire       examined stomach.      The examined duodenum was normal. Impression:               - Grade I esophageal varices.                           - Portal hypertensive gastropathy.                           - Normal examined duodenum.                           - No specimens collected. Moderate Sedation:      Per Anesthesia Care Recommendation:           - Discharge patient to home (ambulatory).                           - Resume previous diet.                           - Repeat upper endoscopy in 2 years for                            surveillance.                           - Complete alcohol cessation Procedure Code(s):        --- Professional ---                           [phone], Esophagogastroduodenoscopy, flexible,                            transoral; diagnostic, including collection of                            specimen(s) by brushing or washing, when performed                            (separate procedure)  Diagnosis Code(s):        --- Professional ---                           I85.00, Esophageal varices without bleeding                           K76.6, Portal hypertension                           K31.89, Other diseases of stomach and duodenum CPT copyright 2022 American Medical Association. All rights reserved. The codes documented in this report are preliminary and upon coder review may  be revised to meet current compliance requirements. Maylon Peppers, MD Maylon Peppers,  09/02/2022 10:59:00 AM This report has been signed electronically. Number of Addenda: 0

## 2022-09-02 NOTE — Progress Notes (Signed)
Patient presenting flat abdomen without ascites.  No need to increase her diuretic at this moment, I advised her to call us back if she presents worsening abdominal distention and we can uptitrate her diuretics accordingly before proceed with a paracentesis. Poor candidate for TIPS given high MELD score.

## 2022-09-02 NOTE — Anesthesia Preprocedure Evaluation (Signed)
Anesthesia Evaluation  Patient identified by MRN, date of birth, ID band Patient awake    Reviewed: Allergy & Precautions, H&P , NPO status , Patient's Chart, lab work & pertinent test results  History of Anesthesia Complications (+) PONV and history of anesthetic complications  Airway Mallampati: II  TM Distance: >3 FB Neck ROM: Full    Dental  (+) Dental Advisory Given, Missing   Pulmonary COPD,  COPD inhaler, former smoker   Pulmonary exam normal breath sounds clear to auscultation       Cardiovascular Exercise Tolerance: Good hypertension, Pt. on medications Normal cardiovascular exam+ Valvular Problems/Murmurs  Rhythm:Regular Rate:Normal     Neuro/Psych  Headaches PSYCHIATRIC DISORDERS Anxiety      Neuromuscular disease    GI/Hepatic ,GERD  Medicated and Controlled,,(+) Cirrhosis   ascites  substance abuse  alcohol use, Hepatitis -, C  Endo/Other  Hypothyroidism    Renal/GU Renal InsufficiencyRenal disease  negative genitourinary   Musculoskeletal  (+)  Fibromyalgia -  Abdominal   Peds negative pediatric ROS (+)  Hematology negative hematology ROS (+)   Anesthesia Other Findings   Reproductive/Obstetrics negative OB ROS                             Anesthesia Physical Anesthesia Plan  ASA: 4  Anesthesia Plan: General   Post-op Pain Management: Minimal or no pain anticipated   Induction: Intravenous  PONV Risk Score and Plan: Propofol infusion  Airway Management Planned: Nasal Cannula and Natural Airway  Additional Equipment:   Intra-op Plan:   Post-operative Plan:   Informed Consent: I have reviewed the patients History and Physical, chart, labs and discussed the procedure including the risks, benefits and alternatives for the proposed anesthesia with the patient or authorized representative who has indicated his/her understanding and acceptance.     Dental  advisory given  Plan Discussed with: CRNA and Surgeon  Anesthesia Plan Comments:        Anesthesia Quick Evaluation

## 2022-09-02 NOTE — Discharge Instructions (Addendum)
You are being discharged to home.  Resume your previous diet.  Your physician has recommended a repeat upper endoscopy in two years for surveillance.  Complete alcohol cessation

## 2022-09-07 ENCOUNTER — Encounter (HOSPITAL_COMMUNITY): Payer: Self-pay | Admitting: Gastroenterology

## 2022-09-13 ENCOUNTER — Telehealth: Payer: Self-pay

## 2022-09-13 MED ORDER — FAMOTIDINE 20 MG PO TABS: 20.0000 mg | ORAL_TABLET | Freq: Every day | ORAL | 3 refills | Status: DC

## 2022-09-13 MED ORDER — PANTOPRAZOLE SODIUM 40 MG PO TBEC: 40.0000 mg | DELAYED_RELEASE_TABLET | Freq: Two times a day (BID) | ORAL | 2 refills | Status: DC

## 2022-09-13 NOTE — Telephone Encounter (Signed)
Pt approved through Cover My Meds for Pantoprazole Sodium 40 mg. Dx used: K21.9 and K58.1. Will contact the pharmacy and the pt. Will also give to Manuela Schwartz to scan in chart

## 2022-09-14 ENCOUNTER — Ambulatory Visit: Payer: Medicaid Other | Admitting: Gastroenterology

## 2022-09-14 ENCOUNTER — Ambulatory Visit (HOSPITAL_COMMUNITY)
Admission: RE | Admit: 2022-09-14 | Discharge: 2022-09-14 | Disposition: A | Discharge status: Home / Self Care | Payer: 59 | Source: Ambulatory Visit | Attending: Interventional Radiology | Admitting: Interventional Radiology

## 2022-09-14 DIAGNOSIS — Z01810 Encounter for preprocedural cardiovascular examination: Secondary | ICD-10-CM | POA: Diagnosis not present

## 2022-09-14 LAB — ECHOCARDIOGRAM COMPLETE
AR max vel: 2.6 cm2
AV Area VTI: 2.4 cm2
AV Area mean vel: 2.33 cm2
AV Mean grad: 15.7 mmHg
AV Peak grad: 24.1 mmHg
Ao pk vel: 2.45 m/s
Area-P 1/2: 2.48 cm2
Est EF: >75
MV M vel: 3.81 m/s
MV Peak grad: 58.1 mmHg
MV VTI: 3.45 cm2
S' Lateral: 2.3 cm

## 2022-09-14 NOTE — Progress Notes (Signed)
  Echocardiogram 2D Echocardiogram has been performed.  Karla Young 09/14/2022, 11:49 AM

## 2022-09-15 ENCOUNTER — Encounter: Payer: Self-pay | Admitting: Nutrition

## 2022-09-20 ENCOUNTER — Other Ambulatory Visit: Payer: Self-pay

## 2022-09-20 ENCOUNTER — Ambulatory Visit (HOSPITAL_COMMUNITY)
Admission: RE | Admit: 2022-09-20 | Discharge: 2022-09-20 | Disposition: A | Discharge status: Home / Self Care | Payer: 59 | Source: Ambulatory Visit | Attending: Gastroenterology | Admitting: Gastroenterology

## 2022-09-20 ENCOUNTER — Encounter (HOSPITAL_COMMUNITY): Payer: Self-pay

## 2022-09-20 ENCOUNTER — Other Ambulatory Visit: Payer: Self-pay | Admitting: Gastroenterology

## 2022-09-20 DIAGNOSIS — K5900 Constipation, unspecified: Secondary | ICD-10-CM

## 2022-09-20 DIAGNOSIS — K7460 Unspecified cirrhosis of liver: Secondary | ICD-10-CM

## 2022-09-20 DIAGNOSIS — R188 Other ascites: Secondary | ICD-10-CM

## 2022-09-20 DIAGNOSIS — K219 Gastro-esophageal reflux disease without esophagitis: Secondary | ICD-10-CM

## 2022-09-20 DIAGNOSIS — K7031 Alcoholic cirrhosis of liver with ascites: Secondary | ICD-10-CM

## 2022-09-20 NOTE — Telephone Encounter (Signed)
Labs released and faxed to [facility].

## 2022-09-20 NOTE — Telephone Encounter (Signed)
Karla Young has been made aware of this from my end

## 2022-09-20 NOTE — Telephone Encounter (Signed)
Spoke with daughter. 2 weeks ago, patient as 132. Now she is 146/147. When I saw her in clinic, she was 155.   Started to retain fluid last week. Abdomen is distended and looking 7-8 months pregnant per daughter. However, no significant ascites on ultrasound today. Unable to do para.  Currently on Lasix 20 mg and spironolactone 50 mg daily. We certainly have room to titrate up but have been doing this cautiously in light of prior AKI.   Continues to use added salt although trying to change habits. Discussed with daughter strict sodium restriction.   Will check CMP, INR now. Upcoming TIPS eval on the 14th. I am concerned her MELD may be too high for this.    Dena: please release labs and fax to [facility] laboratory. Thanks!

## 2022-09-20 NOTE — Telephone Encounter (Signed)
Patient daughter called in and is very concerned about her mom. She stated pt has gained 10 lbs in 1 week. Went for para this morning and they stated she didn't have enough fluid to be drawn off. She would like a call today

## 2022-09-20 NOTE — Addendum Note (Signed)
Addended by: Annitta Needs on: 09/20/2022 04:17 PM   Modules accepted: Orders

## 2022-09-20 NOTE — Telephone Encounter (Signed)
error 

## 2022-09-21 ENCOUNTER — Other Ambulatory Visit (HOSPITAL_COMMUNITY)
Admission: RE | Admit: 2022-09-21 | Discharge: 2022-09-21 | Disposition: A | Discharge status: Home / Self Care | Payer: 59 | Source: Ambulatory Visit | Attending: Gastroenterology | Admitting: Gastroenterology

## 2022-09-21 DIAGNOSIS — R188 Other ascites: Secondary | ICD-10-CM | POA: Insufficient documentation

## 2022-09-21 DIAGNOSIS — K7460 Unspecified cirrhosis of liver: Secondary | ICD-10-CM | POA: Diagnosis not present

## 2022-09-21 LAB — COMPREHENSIVE METABOLIC PANEL
ALT: 58 U/L — ABNORMAL HIGH (ref 0–44)
AST: 74 U/L — ABNORMAL HIGH (ref 15–41)
Albumin: 3.1 g/dL — ABNORMAL LOW (ref 3.5–5.0)
Alkaline Phosphatase: 75 U/L (ref 38–126)
Anion gap: 9 (ref 5–15)
BUN: 12 mg/dL (ref 8–23)
CO2: 26 mmol/L (ref 22–32)
Calcium: 9.4 mg/dL (ref 8.9–10.3)
Chloride: 99 mmol/L (ref 98–111)
Creatinine, Ser: 0.97 mg/dL (ref 0.44–1.00)
GFR, Estimated: >60 mL/min (ref >60)
Glucose, Bld: 99 mg/dL (ref 70–99)
Potassium: 3.8 mmol/L (ref 3.5–5.1)
Sodium: 134 mmol/L — ABNORMAL LOW (ref 135–145)
Total Bilirubin: 6.4 mg/dL — ABNORMAL HIGH (ref 0.3–1.2)
Total Protein: 6.6 g/dL (ref 6.5–8.1)

## 2022-09-22 ENCOUNTER — Other Ambulatory Visit: Payer: Medicaid Other

## 2022-09-22 ENCOUNTER — Ambulatory Visit
Admission: RE | Admit: 2022-09-22 | Discharge: 2022-09-22 | Disposition: A | Discharge status: Home / Self Care | Payer: Medicaid Other | Source: Ambulatory Visit | Attending: Interventional Radiology | Admitting: Interventional Radiology

## 2022-09-22 DIAGNOSIS — R188 Other ascites: Secondary | ICD-10-CM

## 2022-09-22 HISTORY — PX: IR RADIOLOGIST EVAL & MGMT: IMG5224

## 2022-09-22 NOTE — Progress Notes (Signed)
Reason for follow up: Patient was seen in virtual telephone consultation today for recurrent ascites in the setting of cirrhosis   Referring Physician(s): Roseanne Kaufman, NP, Maylon Peppers, MD   History of Present Illness: Initial consult HPI 08/26/22:  Karla Young is a 62 y.o. female with history of alcoholic/hepatitis C cirrhosis and recurrent ascites.  She was first diagnosed with cirrhosis in in 2022, and had her first paracentesis in December of 2022.  She has been managed with diuretics, however recently has suffered some renal insufficiency so her diuretics have been reduced, now taking only furosemide 20 mg QD and spironolactone 50 mg QD.  Her care has been haphazard between [place] and [place] providers, but she has recently settled on receiving care with the [place] group.  Over the last month she had a paracentesis on December 14th yielding 4 L and again earlier this month at an outside facility, reporting 5 L drained.  She has bene attempting to adhere to a low sodium diet.     She denies history of upper or lower GI bleeding.  Prior EGD on 09/02/21 demonstrated grade I esophageal varices and portal hypertensive gastropathy.  She denies any prior hepatic encephalopathy, however takes lactulose.  She has remained sober since Thanksgiving 2023 when she also gave up smoking.     She has a forthcoming appointment with [facility] for consideration of transplant workup in March.     She reports a history of having a heart murmur, but has never had an echocardiogram.   Since her last visit she reports presenting for a paracentesis but had scant ascites therefore this was not performed.  She has increased her diuretics.  She had labs and an echocardiogram performed recently.  Past Medical History:  Diagnosis Date   Anxiety    Cirrhosis ([place])    COPD (chronic obstructive pulmonary disease) (HCC)    Fibromyalgia    GERD (gastroesophageal reflux disease)    Heart murmur     Hepatitis C    Hypertension    Hypotension    Hypothyroid    Migraines    MRSA (methicillin resistant staph aureus) culture positive    PONV (postoperative nausea and vomiting)    difficulty waking up after anesthesia    Past Surgical History:  Procedure Laterality Date   ABDOMINAL HYSTERECTOMY     partial   BACK SURGERY     lumbar removal of disc   BIOPSY  09/06/2014   Procedure: BIOPSY;  Surgeon: Rogene Houston, MD;  Location: [facility];  Service: Endoscopy;;   CHOLECYSTECTOMY     COLONOSCOPY WITH PROPOFOL N/A 09/06/2014   Procedure: COLONOSCOPY WITH PROPOFOL (Procedure #2) At cecum at 0819; total withdrawal time=13 minutes;  Surgeon: Rogene Houston, MD;  Location: [facility];  Service: Endoscopy;  Laterality: N/A;   ESOPHAGEAL DILATION N/A 09/06/2014   Procedure: ESOPHAGEAL DILATION WITH MALONEY DILATOR 56MM;  Surgeon: Rogene Houston, MD;  Location: [facility];  Service: Endoscopy;  Laterality: N/A;   ESOPHAGOGASTRODUODENOSCOPY (EGD) WITH PROPOFOL N/A 09/06/2014   Procedure: ESOPHAGOGASTRODUODENOSCOPY (EGD) WITH PROPOFOL (Procedure #1) ;  Surgeon: Rogene Houston, MD;  Location: [facility];  Service: Endoscopy;  Laterality: N/A;   ESOPHAGOGASTRODUODENOSCOPY (EGD) WITH PROPOFOL N/A 09/02/2021   Procedure: ESOPHAGOGASTRODUODENOSCOPY (EGD) WITH PROPOFOL;  Surgeon: Harvel Quale, MD;  Location: [facility];  Service: Gastroenterology;  Laterality: N/A;  830   ESOPHAGOGASTRODUODENOSCOPY (EGD) WITH PROPOFOL N/A 09/02/2022   Procedure: ESOPHAGOGASTRODUODENOSCOPY (EGD) WITH PROPOFOL;  Surgeon: Jenetta Downer  Roney Marion, MD;  Location: [facility];  Service: Gastroenterology;  Laterality: N/A;  1030am, asa 3   HEMORRHOID SURGERY     Dr Raelyn Ensign   IR RADIOLOGIST EVAL & MGMT  08/26/2022   PARTIAL HYSTERECTOMY      One ovary left   RECTAL PROLAPSE REPAIR     WRIST SURGERY  1997    Allergies: Patient has no known allergies.  Medications: Prior to Admission medications    Medication Sig Start Date End Date Taking? Authorizing Provider  albuterol (PROVENTIL) (2.5 MG/3ML) 0.083% nebulizer solution Take 3 mLs (2.5 mg total) by nebulization every 2 (two) hours as needed for wheezing. 07/23/22   Roxan Hockey, MD  albuterol (VENTOLIN HFA) 108 (90 Base) MCG/ACT inhaler Inhale 2 puffs into the lungs every 6 (six) hours as needed for wheezing or shortness of breath. 07/23/22   Roxan Hockey, MD  famotidine (PEPCID) 20 MG tablet Take 1 tablet (20 mg total) by mouth at bedtime. 09/13/22   Annitta Needs, NP  folic acid (FOLVITE) 1 MG tablet Take 1 tablet (1 mg total) by mouth daily. 07/24/22   Roxan Hockey, MD  furosemide (LASIX) 40 MG tablet Take 0.5 tablets (20 mg total) by mouth daily. 09/02/22   Harvel Quale, MD  hydrOXYzine (ATARAX) 25 MG tablet Take 1 tablet (25 mg total) by mouth every 8 (eight) hours as needed for anxiety or nausea. 07/23/22   Roxan Hockey, MD  lactulose (CHRONULAC) 10 GM/15ML solution Take 15 mLs (10 g total) by mouth daily. Goal is to have 2 to 3 Mushy/Loose stools per Day 07/23/22   Roxan Hockey, MD  levothyroxine (SYNTHROID) 88 MCG tablet Take 1 tablet (88 mcg total) by mouth daily before breakfast. 07/23/22   Roxan Hockey, MD  linaclotide (LINZESS) 145 MCG CAPS capsule Take 145 mcg by mouth daily before breakfast.    [provider]  midodrine (PROAMATINE) 5 MG tablet Take 5 mg by mouth 3 (three) times daily with meals.    [provider]  Multiple Vitamins-Minerals (MULTIVITAMIN WITH MINERALS) tablet Take 1 tablet by mouth daily. 07/23/22 07/23/23  Roxan Hockey, MD  ondansetron (ZOFRAN) 4 MG tablet Take 1 tablet (4 mg total) by mouth every 6 (six) hours as needed for nausea. 08/30/22   Annitta Needs, NP  pantoprazole (PROTONIX) 40 MG tablet Take 1 tablet (40 mg total) by mouth 2 (two) times daily. 09/13/22   Annitta Needs, NP  spironolactone (ALDACTONE) 100 MG tablet Take 0.5 tablets (50 mg total)  by mouth daily. 09/02/22   Harvel Quale, MD  thiamine (VITAMIN B-1) 100 MG tablet Take 1 tablet (100 mg total) by mouth daily. 07/24/22   Roxan Hockey, MD     Family History  Problem Relation Age of Onset   Colon cancer Neg Hx    Colon polyps Neg Hx     Social History   Socioeconomic History   Marital status: Widowed    Spouse name: Not on file   Number of children: Not on file   Years of education: Not on file   Highest education level: Not on file  Occupational History   Not on file  Tobacco Use   Smoking status: Former    Packs/day: 0.25    Years: 30.00    Total pack years: 7.50    Types: Cigarettes   Smokeless tobacco: Never   Tobacco comments:    3-4 cigs a day since age 64.  Vaping Use  Vaping Use: Never used  Substance and Sexual Activity   Alcohol use: Not Currently    Comment: used to beer nightly not sure of how many   Drug use: No   Sexual activity: Yes    Birth control/protection: Surgical  Other Topics Concern   Not on file  Social History Narrative   Not on file   Social Determinants of Health   Financial Resource Strain: Not on file  Food Insecurity: Not on file  Transportation Needs: Not on file  Physical Activity: Not on file  Stress: Not on file  Social Connections: Not on file     Vital Signs: There were no vitals taken for this visit.  No physical examination was performed in lieu of virtual telephone clinic visit.  Imaging: [place] ASCITES (ABDOMEN LIMITED)  Result Date: 09/20/2022 CLINICAL DATA:  Hepatic cirrhosis, ascites. EXAM: LIMITED ABDOMEN ULTRASOUND FOR ASCITES TECHNIQUE: Limited ultrasound survey for ascites was performed in all four abdominal quadrants. COMPARISON:  August 25, 2022. FINDINGS: Mild ascites is noted in the right upper quadrant, with minimal ascites seen in the remaining portions of the abdomen. No adequate fluid pocket for paracentesis is noted. IMPRESSION: Minimal to mild ascites as described  above. No adequate fluid pocket for paracentesis. Electronically Signed   By: Marijo Conception M.D.   On: 09/20/2022 09:49    Echocardiogram:  09/14/22 IMPRESSIONS   1. There is mild SAM of the anterior MV leaflet in setting of very  hyperdynamic LV function in absence of significant hypertrophy. Visually  there is a mild dynamic gradient, from available Doppler not able to  calculate exact pressures. . Left  ventricular ejection fraction, by estimation, is >75%. The left ventricle  has hyperdynamic function. The left ventricle has no regional wall motion  abnormalities. Left ventricular diastolic parameters were normal. The  average left ventricular global  longitudinal strain is -22.5 %. The global longitudinal strain is normal.   2. Right ventricular systolic function is normal. The right ventricular  size is normal. There is severely elevated pulmonary artery systolic  pressure.   3. Left atrial size was moderately dilated.   4. The mitral valve is abnormal. Mild mitral valve regurgitation. No  evidence of mitral stenosis.   5. Mild mean gradient of 16 mmHg across the aortic valve. Valve opens  well, appears gradient primarily due to the hyperdynamic LV and mild SAM  creating a mild dynamic subvalvular gradient as opposed to valvular  disease. . The aortic valve is tricuspid.  Aortic valve regurgitation is not visualized. No aortic stenosis is  present.   6. The inferior vena cava is normal in size with greater than 50%  respiratory variability, suggesting right atrial pressure of 3 mmHg.    Labs: (09/21/22) Creatinine: 0.97 Total Bilirubin: 6.4 INR: 2.2 Sodium: 134 Albumin: 3.1  Child-Pugh = 10 points, class C MELD = 22 (19.6% 3 estimated 3 month mortality) [place] Index of Post-TIPS Survival (FIPS) = 0.80 (overall survival predicted at 1 month 90.3%, 3 months 70.5%, and 6 months 58.9%)    Assessment: Toby Andros is a 62 y.o. female with history of alcoholic/HCV  cirrhosis (Child Pugh C, MELD 22) with history of ascites, though not refractory.  She is a poor candidate for TIPS creation due to high MELD (specifically hyperbilirubinemia), and has no current indication for such.  If her hepatic function improves and ascites becomes refractory, she may be re-assessed for TIPS candidacy.  Plan: Follow up with IR as needed.  Ruthann Cancer, MD Pager: [phone] Clinic: [phone]    I spent a total of 25 Minutes in virtual telephone clinical consultation, greater than 50% of which was counseling/coordinating care for cirrhosis.

## 2022-09-23 ENCOUNTER — Other Ambulatory Visit (HOSPITAL_COMMUNITY)
Admission: RE | Admit: 2022-09-23 | Discharge: 2022-09-23 | Disposition: A | Discharge status: Home / Self Care | Payer: 59 | Source: Ambulatory Visit | Attending: Gastroenterology | Admitting: Gastroenterology

## 2022-09-23 ENCOUNTER — Ambulatory Visit (INDEPENDENT_AMBULATORY_CARE_PROVIDER_SITE_OTHER): Payer: Medicaid Other | Admitting: Gastroenterology

## 2022-09-23 ENCOUNTER — Ambulatory Visit: Payer: Medicaid Other | Admitting: Gastroenterology

## 2022-09-23 ENCOUNTER — Encounter: Payer: Self-pay | Admitting: Gastroenterology

## 2022-09-23 VITALS — BP 125/74 | HR 83 | Temp 98.4°F | Ht 63.0 in | Wt 147.8 lb

## 2022-09-23 DIAGNOSIS — I8510 Secondary esophageal varices without bleeding: Secondary | ICD-10-CM | POA: Diagnosis not present

## 2022-09-23 DIAGNOSIS — K7460 Unspecified cirrhosis of liver: Secondary | ICD-10-CM

## 2022-09-23 DIAGNOSIS — R188 Other ascites: Secondary | ICD-10-CM

## 2022-09-23 LAB — PROTIME-INR
INR: 1.9 — ABNORMAL HIGH (ref 0.8–1.2)
Prothrombin Time: 21.3 seconds — ABNORMAL HIGH (ref 11.4–15.2)

## 2022-09-23 NOTE — Patient Instructions (Addendum)
Take Lactulose two to three times a day, with the goal of 3 soft bowel movements.   Let's keep diuretics at Lasix 20 milligrams and spironolactone 50 milligrams right now. Weigh yourself daily. If you notice more lower extremity swelling or feeling tight, we can titrate this up.  I would like to recheck your kidney numbers one more time next week. Will also order the tumor marker that we check every 6 months to be done at that time. Please take the orders to the hospital lab next week.   We will see you back in April!  I enjoyed seeing you again today! At our first visit, I mentioned how I value our relationship and want to provide genuine, compassionate, and quality care. You may receive a survey regarding your visit with me, and I welcome your feedback! Thanks so much for taking the time to complete this. I look forward to seeing you again.   Annitta Needs, PhD, ANP-BC [facility]

## 2022-09-23 NOTE — Progress Notes (Addendum)
Gastroenterology Office Note     Primary Care Physician:  Patient, No Pcp Per  Primary Gastroenterologist: Dr. Jenetta Downer    Chief Complaint   Chief Complaint  Patient presents with   Follow-up    Here to discuss meds and fluctuating weight     History of Present Illness   Karla Young is a 62 y.o. female presenting today in follow-up with a history of decompensated liver cirrhosis secondary to hepatitis C (previously treated in 2022 and again in 2015 s/p eradication) and alcoholism complicated by ascites and nonbleeding grade 1 esophageal varices, prior alcohol abuse, IBS, fibromyalgia, COPD, anxiety, hypertension, hypothyroidism, GERD and IBS   She was inpatient at [facility] Dec 2023 with ascites and AKI. Underwent para with 4 liters removed. Negative SBP. She then was admitted at [facility] end of December 12/26-12/30 with recurrent abdominal distension, acute renal failure with creatinine 2.3 but improved to 0.93 on day of discharge. She underwent another para with 5 liters removed. Not much of an appetite. No alcohol since Nov.   Returns today in early interval follow-up. Recent US without ascites. We had increased Lasix to 40 mg and spironolactone to 100 mg daily last week with repeat BMP upcoming. She states she took Lasix 20 mg and aldactone 50 mg today. AFP also due. She tells me she is not drinking enough water. Daughter had messaged concerned about weight gain as she had lost down to 132 as outpatient then up to 146/147. However, when I last saw in January, she was 155. She is 147 today. She is taking lactulose just once per day.    New patient eval with Roosevelt Locks, NP, upcoming on 3/13. RUQ [place] due in July 2024.   She has been seen by IR and not a TIPS candidate.      EGD Jan 2024   Grade I esophageal varices.                           - Portal hypertensive gastropathy.                           - Normal examined duodenum.                           - No  specimens collected.   Past Medical History:  Diagnosis Date   Anxiety    Cirrhosis ([place])    COPD (chronic obstructive pulmonary disease) (HCC)    Fibromyalgia    GERD (gastroesophageal reflux disease)    Heart murmur    Hepatitis C    Hypertension    Hypotension    Hypothyroid    Migraines    MRSA (methicillin resistant staph aureus) culture positive    PONV (postoperative nausea and vomiting)    difficulty waking up after anesthesia    Past Surgical History:  Procedure Laterality Date   ABDOMINAL HYSTERECTOMY     partial   BACK SURGERY     lumbar removal of disc   BIOPSY  09/06/2014   Procedure: BIOPSY;  Surgeon: Rogene Houston, MD;  Location: [facility];  Service: Endoscopy;;   CHOLECYSTECTOMY     COLONOSCOPY WITH PROPOFOL N/A 09/06/2014   Procedure: COLONOSCOPY WITH PROPOFOL (Procedure #2) At cecum at 0819; total withdrawal time=13 minutes;  Surgeon: Rogene Houston, MD;  Location: [facility];  Service: Endoscopy;  Laterality: N/A;   ESOPHAGEAL DILATION N/A 09/06/2014   Procedure: ESOPHAGEAL DILATION WITH MALONEY DILATOR 56MM;  Surgeon: Rogene Houston, MD;  Location: [facility];  Service: Endoscopy;  Laterality: N/A;   ESOPHAGOGASTRODUODENOSCOPY (EGD) WITH PROPOFOL N/A 09/06/2014   Procedure: ESOPHAGOGASTRODUODENOSCOPY (EGD) WITH PROPOFOL (Procedure #1) ;  Surgeon: Rogene Houston, MD;  Location: [facility];  Service: Endoscopy;  Laterality: N/A;   ESOPHAGOGASTRODUODENOSCOPY (EGD) WITH PROPOFOL N/A 09/02/2021   Procedure: ESOPHAGOGASTRODUODENOSCOPY (EGD) WITH PROPOFOL;  Surgeon: Harvel Quale, MD;  Location: [facility];  Service: Gastroenterology;  Laterality: N/A;  830   ESOPHAGOGASTRODUODENOSCOPY (EGD) WITH PROPOFOL N/A 09/02/2022   Procedure: ESOPHAGOGASTRODUODENOSCOPY (EGD) WITH PROPOFOL;  Surgeon: Harvel Quale, MD;  Location: [facility];  Service: Gastroenterology;  Laterality: N/A;  1030am, asa 3   HEMORRHOID SURGERY     Dr Raelyn Ensign   IR  RADIOLOGIST EVAL & MGMT  08/26/2022   IR RADIOLOGIST EVAL & MGMT  09/22/2022   PARTIAL HYSTERECTOMY      One ovary left   RECTAL PROLAPSE REPAIR     WRIST SURGERY  1997    Current Outpatient Medications  Medication Sig Dispense Refill   albuterol (PROVENTIL) (2.5 MG/3ML) 0.083% nebulizer solution Take 3 mLs (2.5 mg total) by nebulization every 2 (two) hours as needed for wheezing. 75 mL 5   albuterol (VENTOLIN HFA) 108 (90 Base) MCG/ACT inhaler Inhale 2 puffs into the lungs every 6 (six) hours as needed for wheezing or shortness of breath. 18 g 2   famotidine (PEPCID) 20 MG tablet Take 1 tablet (20 mg total) by mouth at bedtime. 90 tablet 3   folic acid (FOLVITE) 1 MG tablet Take 1 tablet (1 mg total) by mouth daily. 30 tablet 3   furosemide (LASIX) 40 MG tablet Take 0.5 tablets (20 mg total) by mouth daily. 90 tablet 1   hydrOXYzine (ATARAX) 25 MG tablet Take 1 tablet (25 mg total) by mouth every 8 (eight) hours as needed for anxiety or nausea. 30 tablet 1   lactulose (CHRONULAC) 10 GM/15ML solution Take 15 mLs (10 g total) by mouth daily. Goal is to have 2 to 3 Mushy/Loose stools per Day 473 mL 2   levothyroxine (SYNTHROID) 88 MCG tablet Take 1 tablet (88 mcg total) by mouth daily before breakfast. 30 tablet 3   linaclotide (LINZESS) 145 MCG CAPS capsule Take 145 mcg by mouth daily before breakfast.     Multiple Vitamins-Minerals (MULTIVITAMIN WITH MINERALS) tablet Take 1 tablet by mouth daily. 120 tablet 2   ondansetron (ZOFRAN) 4 MG tablet Take 1 tablet (4 mg total) by mouth every 6 (six) hours as needed for nausea. 60 tablet 5   pantoprazole (PROTONIX) 40 MG tablet Take 1 tablet (40 mg total) by mouth 2 (two) times daily. 180 tablet 2   spironolactone (ALDACTONE) 100 MG tablet Take 0.5 tablets (50 mg total) by mouth daily. 90 tablet 1   thiamine (VITAMIN B-1) 100 MG tablet Take 1 tablet (100 mg total) by mouth daily. 100 tablet 2   No current facility-administered medications for this  visit.    Allergies as of 09/23/2022   (No Known Allergies)    Family History  Problem Relation Age of Onset   Colon cancer Neg Hx    Colon polyps Neg Hx     Social History   Socioeconomic History   Marital status: Widowed    Spouse name: Not on file   Number of children: Not on file  Years of education: Not on file   Highest education level: Not on file  Occupational History   Not on file  Tobacco Use   Smoking status: Former    Packs/day: 0.25    Years: 30.00    Total pack years: 7.50    Types: Cigarettes   Smokeless tobacco: Never   Tobacco comments:    3-4 cigs a day since age 25.  Vaping Use   Vaping Use: Never used  Substance and Sexual Activity   Alcohol use: Not Currently    Comment: used to beer nightly not sure of how many   Drug use: No   Sexual activity: Yes    Birth control/protection: Surgical  Other Topics Concern   Not on file  Social History Narrative   Not on file   Social Determinants of Health   Financial Resource Strain: Not on file  Food Insecurity: Not on file  Transportation Young: Not on file  Physical Activity: Not on file  Stress: Not on file  Social Connections: Not on file  Intimate Partner Violence: Not on file     Review of Systems   Gen: Denies any fever, chills, fatigue, weight loss, lack of appetite.  CV: Denies chest pain, heart palpitations, peripheral edema, syncope.  Resp: Denies shortness of breath at rest or with exertion. Denies wheezing or cough.  GI: Denies dysphagia or odynophagia. Denies jaundice, hematemesis, fecal incontinence. GU : Denies urinary burning, urinary frequency, urinary hesitancy MS: Denies joint pain, muscle weakness, cramps, or limitation of movement.  Derm: Denies rash, itching, dry skin Psych: Denies depression, anxiety, memory loss, and confusion Heme: Denies bruising, bleeding, and enlarged lymph nodes.   Physical Exam   BP 125/74 (BP Location: Right Arm, Patient Position:  Sitting, Cuff Size: Normal)   Pulse 83   Temp 98.4 F (36.9 C) (Oral)   Ht 5' 3"$  (1.6 m)   Wt 147 lb 12.8 oz (67 kg)   SpO2 100%   BMI 26.18 kg/m  General:   Alert and oriented. Pleasant and cooperative. Bronze complexion Head:  Normocephalic and atraumatic. Eyes:  With scleral icterus Abdomen:  +BS, soft, non-tender and non-distended. No HSM noted. No guarding or rebound. No masses appreciated.  Rectal:  Deferred  Msk:  Symmetrical without gross deformities. Normal posture. Extremities:  Without edema. Neurologic:  Alert and  oriented x4;  grossly normal neurologically. Skin:  Intact without significant lesions or rashes. Psych:  Alert and cooperative. Normal mood and affect.   Assessment   Chinda Shinabery is a 62 y.o. female presenting today in follow-up with a history of decompensated liver cirrhosis secondary to hepatitis C  (with successful treatment) and alcohol, complicated by ascites and nonbleeding grade 1 esophageal varices, alcohol abuse, IBS, fibromyalgia, COPD, anxiety, hypertension, hypothyroidism, GERD and IBS.  Returns today in close follow-up s/p EGD with Grade 1 esophageal varices. Due to concern for recurrent ascites, [place] was completed and showed insufficient ascites. Daughter had reached out regarding weight gain at home, and we briefly adjusted diuretics to Lasix 40 and Aldactone 100 mg for a few days; she decreased this on her own back to Lasix 20 mg and aldactone 50 mg daily. I do not see any evidence for tense ascites, and she has trace lower extremity edema.   Currently, she is not a TIPS candidate due to high MELD score. She does have an upcoming appt with Transplant in [place] on 3/13. She has been sober since November and was applauded on this.  PLAN    Will keep Lasix at 20 mg daily and spironolactone 50 mg daily BMP and AFP ordered (updating due to recent adjustment in diuretics) Increase lactulose to 2-3 times per day with goal of 3 soft  BMs Return in 2 months Keep upcoming appt with Transplant Absolute ETOH cessation Repeat EGD in 08/2024   Karla Needs, PhD, ANP-BC [facility]   I have reviewed the note and agree with the APP's assessment as described in this progress note  Maylon Peppers, MD Gastroenterology and [place] [facility] [facility]

## 2022-10-05 ENCOUNTER — Encounter: Payer: Self-pay | Admitting: Nutrition

## 2022-10-05 ENCOUNTER — Other Ambulatory Visit (HOSPITAL_COMMUNITY)
Admission: RE | Admit: 2022-10-05 | Discharge: 2022-10-05 | Disposition: A | Discharge status: Home / Self Care | Payer: 59 | Source: Ambulatory Visit | Attending: Gastroenterology | Admitting: Gastroenterology

## 2022-10-05 ENCOUNTER — Encounter: Payer: 59 | Attending: Gastroenterology | Admitting: Nutrition

## 2022-10-05 VITALS — Ht 63.0 in | Wt 145.0 lb

## 2022-10-05 DIAGNOSIS — R188 Other ascites: Secondary | ICD-10-CM | POA: Diagnosis not present

## 2022-10-05 DIAGNOSIS — I10 Essential (primary) hypertension: Secondary | ICD-10-CM | POA: Insufficient documentation

## 2022-10-05 DIAGNOSIS — K7460 Unspecified cirrhosis of liver: Secondary | ICD-10-CM | POA: Insufficient documentation

## 2022-10-05 DIAGNOSIS — E441 Mild protein-calorie malnutrition: Secondary | ICD-10-CM

## 2022-10-05 LAB — COMPREHENSIVE METABOLIC PANEL
ALT: 56 U/L — ABNORMAL HIGH (ref 0–44)
AST: 72 U/L — ABNORMAL HIGH (ref 15–41)
Albumin: 3.1 g/dL — ABNORMAL LOW (ref 3.5–5.0)
Alkaline Phosphatase: 71 U/L (ref 38–126)
Anion gap: 10 (ref 5–15)
BUN: 15 mg/dL (ref 8–23)
CO2: 23 mmol/L (ref 22–32)
Calcium: 9.1 mg/dL (ref 8.9–10.3)
Chloride: 96 mmol/L — ABNORMAL LOW (ref 98–111)
Creatinine, Ser: 1.03 mg/dL — ABNORMAL HIGH (ref 0.44–1.00)
GFR, Estimated: >60 mL/min (ref >60)
Glucose, Bld: 111 mg/dL — ABNORMAL HIGH (ref 70–99)
Potassium: 3.7 mmol/L (ref 3.5–5.1)
Sodium: 129 mmol/L — ABNORMAL LOW (ref 135–145)
Total Bilirubin: 5.6 mg/dL — ABNORMAL HIGH (ref 0.3–1.2)
Total Protein: 6.8 g/dL (ref 6.5–8.1)

## 2022-10-05 NOTE — Patient Instructions (Signed)
Goals   Increase more plant based foods Increase protein rich foods of dried beans, peas, lentils Try to set alarm to get up earlier to eat breakfast so  you can eat lunch around 12-2 and dinner between 5.-7 pm. Drink an ensure with 1/2 sandwich if you don't feel like eating a lot for a meal.

## 2022-10-05 NOTE — Progress Notes (Signed)
Medical Nutrition Therapy    [facility] visit Appointment Start time: [phone]  Appointment End time:  1645  Primary concerns today: Cirrhosis  Referral diagnosis: K21.8 Preferred learning style: see and hear Learning readiness: Ready    NUTRITION ASSESSMENT Follow up Cirrhosis and Malnutrition  62 yr old wfemale h ere for Cirrhosis of the Liver from alcohol abuse and  Hep C. PMHY: Ascites, non bleeding grade 1 esophageal varices, IBS, Fibromyalgia, COPD, Anxiety, HTN, Hypothyroidism, GERD. Last ETOH consumption was November 2023.  Stays tired and finds it hard to have energy to fix meals at times.  Just did blood work for GI today. Saw PCP today for a stye on her right eye and got an antibotic ointment. Hasn't had any fluid removed since December. Trying to avoid salty foods. Still eating only 2 meals per day. Does drink Ensure on occasion. Wt is stable for the most part. Gained 4 lbs since January but has coat and boots on. Still on the waiting list for transplant.  Sleep varies.  She notes she is working on trying to eat healthier and avoiding junk food and processed foods  Anthropometrics  Wt Readings from Last 3 Encounters:  10/05/22 145 lb (65.8 kg)  09/23/22 147 lb 12.8 oz (67 kg)  08/31/22 141 lb (64 kg)   Ht Readings from Last 3 Encounters:  10/05/22 '5\' 3"'$  (1.6 m)  09/23/22 '5\' 3"'$  (1.6 m)  08/31/22 '5\' 3"'$  (1.6 m)   Body mass index is 25.69 kg/m. '@BMIFA'$ @ Facility age limit for growth %iles is 20 years. Facility age limit for growth %iles is 20 years.    Clinical Medical Hx: See chart Medications: see char Labs:     Latest Ref Rng & Units 09/21/2022    9:00 AM 08/31/2022    8:30 AM 07/23/2022   11:17 AM  CMP  Glucose 70 - 99 mg/dL 99  112  92   BUN 8 - 23 mg/dL '12  8  23   '$ Creatinine 0.44 - 1.00 mg/dL 0.97  0.88  1.24   Sodium 135 - 145 mmol/L 134  137  136   Potassium 3.5 - 5.1 mmol/L 3.8  3.5  4.2   Chloride 98 - 111 mmol/L 99  104  99   CO2 22 - 32 mmol/L '26  25   29   '$ Calcium 8.9 - 10.3 mg/dL 9.4  9.5  9.2   Total Protein 6.5 - 8.1 g/dL 6.6  7.6  7.0   Total Bilirubin 0.3 - 1.2 mg/dL 6.4  6.8  6.1   Alkaline Phos 38 - 126 U/L 75  75  74   AST 15 - 41 U/L 74  80  80   ALT 0 - 44 U/L 58  47  42    Lipid Panel    Notable Signs/Symptoms: Denies any  Lifestyle & Dietary Hx LIves alone. Eats out and at home.  Estimated daily fluid intake: 60-80 oz Supplements: MVI, Thiamin Sleep: Varies; 6-8 hrs  Stress / self-care: No Current average weekly physical activity: ADL  24-Hr Dietary Recall First Meal: Boiled eggs or pancake, water, Sunny D Juice. Snack:  Second Meal: Skipped lunch yesterday , Chocolate Ensure Snack:  Third Meal: Can't remember Snack: none Beverages: Water  Estimated Energy Needs Calories: 1800 Carbohydrate: 200g Protein: 135g Fat: 50g   NUTRITION DIAGNOSIS  NI-1.6 Predicted suboptional energy  As related to Cirrhosis and malnutrition.  As evidenced by on going weight loss, ascites and diet recall.  NUTRITION INTERVENTION  Nutrition education (E-1) on the following topics:  High Calorie High Protein Diet Cirrhosis Medical Nutrition Therapy  Handouts Provided Include  Mailed High Calorie High Protein Diet Nutrition Therapy for Cirrhosis.   Learning Style & Readiness for Change Teaching method utilized: Visual & Auditory  Demonstrated degree of understanding via: Teach Back  Barriers to learning/adherence to lifestyle change: Willingness to comply with recommendations.  Goals Established by Pt  Increase more plant based foods Increase protein rich foods of dried beans, peas, lentils Try to set alarm to get up earlier to eat breakfast so  you can eat lunch around 12-2 and dinner between 5.-7 pm. Drink an ensure with 1/2 sandwich if you don't feel like eating a lot for a meal.  MONITORING & EVALUATION Dietary intake, weekly physical activity, and weight in 3 months.  Next Steps  Patient is to work on  eating more consistent balanced meals..   If you have any question regarding your patient's care, do not hesitate to call me at [phone]   Sincerely,  Jearld Fenton, RDN, CDE

## 2022-10-07 LAB — AFP TUMOR MARKER: AFP, Serum, Tumor Marker: 4.8 ng/mL (ref 0.0–9.2)

## 2022-10-12 ENCOUNTER — Telehealth (INDEPENDENT_AMBULATORY_CARE_PROVIDER_SITE_OTHER): Payer: Self-pay | Admitting: *Deleted

## 2022-10-12 ENCOUNTER — Other Ambulatory Visit (INDEPENDENT_AMBULATORY_CARE_PROVIDER_SITE_OTHER): Payer: Self-pay | Admitting: *Deleted

## 2022-10-12 DIAGNOSIS — K7460 Unspecified cirrhosis of liver: Secondary | ICD-10-CM

## 2022-10-12 NOTE — Telephone Encounter (Signed)
Received message from central scheduling that patient is wanting to have a PARA scheduled. Please Advise Karla Young as patient does not have standing orders

## 2022-10-12 NOTE — Telephone Encounter (Signed)
Thanks. Order placed. Tonya at central scheduling is aware.

## 2022-10-12 NOTE — Telephone Encounter (Signed)
We can do a para, max 5 liters. Albumin as per protocol. Cell count for fluid.  I doubt she will have fluid, but we can see!

## 2022-10-13 ENCOUNTER — Ambulatory Visit (HOSPITAL_COMMUNITY)
Admission: RE | Admit: 2022-10-13 | Discharge: 2022-10-13 | Disposition: A | Discharge status: Home / Self Care | Payer: 59 | Source: Ambulatory Visit | Attending: Gastroenterology | Admitting: Gastroenterology

## 2022-10-13 ENCOUNTER — Encounter (HOSPITAL_COMMUNITY): Payer: Self-pay

## 2022-10-13 DIAGNOSIS — K7460 Unspecified cirrhosis of liver: Secondary | ICD-10-CM

## 2022-10-13 DIAGNOSIS — R188 Other ascites: Secondary | ICD-10-CM | POA: Diagnosis not present

## 2022-10-13 LAB — BODY FLUID CELL COUNT WITH DIFFERENTIAL
Eos, Fluid: 0 %
Lymphs, Fluid: 70 %
Monocyte-Macrophage-Serous Fluid: 23 % — ABNORMAL LOW (ref 50–90)
Neutrophil Count, Fluid: 7 % (ref 0–25)
Total Nucleated Cell Count, Fluid: 126 cu mm (ref 0–1000)

## 2022-10-13 NOTE — Progress Notes (Signed)
Paracentesis complete, 4L ascites removed. No sings of distress.

## 2022-10-15 LAB — PATHOLOGIST SMEAR REVIEW

## 2022-10-18 ENCOUNTER — Telehealth: Payer: Self-pay | Admitting: Gastroenterology

## 2022-10-18 NOTE — Telephone Encounter (Signed)
Daughter Jinny Blossom, left a message saying that patient needed to get an appt ASAP to have fluid drawn off.  She said that Jesslynn was complaining of chest pain.  There are no appts available today as we only have 1 provider.  Please advise.

## 2022-10-18 NOTE — Telephone Encounter (Signed)
Phoned and spoke with the pt and advised to go to the ED to be evaluated (due to only one provider being here and she needs to be evaluated ), also advised to look in her MyChart message regarding Vicente Males Boone's instructions.

## 2022-10-18 NOTE — Telephone Encounter (Signed)
Tammy/Mindy/Tanya:  Can we arrange a para today? No more than 4 liters, need cell count.

## 2022-10-19 ENCOUNTER — Encounter (INDEPENDENT_AMBULATORY_CARE_PROVIDER_SITE_OTHER): Payer: Self-pay | Admitting: Gastroenterology

## 2022-10-19 ENCOUNTER — Ambulatory Visit (INDEPENDENT_AMBULATORY_CARE_PROVIDER_SITE_OTHER): Payer: 59 | Admitting: Gastroenterology

## 2022-10-19 VITALS — BP 106/63 | HR 86 | Temp 99.2°F | Ht 63.0 in | Wt 143.1 lb

## 2022-10-19 DIAGNOSIS — K7460 Unspecified cirrhosis of liver: Secondary | ICD-10-CM

## 2022-10-19 DIAGNOSIS — R188 Other ascites: Secondary | ICD-10-CM

## 2022-10-19 NOTE — Progress Notes (Signed)
Referring Provider: Coolidge Breeze, FNP Primary Care Physician:  Coolidge Breeze, FNP Primary GI Physician: Dr. Jenetta Downer   Chief Complaint  Patient presents with   Cirrhosis    Follow up on cirrhosis. Reports she is having swelling in her abdomen. Had paracentesis on 36/24. Has 2 -3 stools per day. Requesting samples of linzess 145 mcg. Has ran out and insurance does not cover. Reflux doing well with protonix and pepcid.    HPI:   Karla Young is a 62 y.o. female with past medical history of history of decompensated liver cirrhosis secondary to hepatitis C (previously treated in 2022 and again in 2015 s/p eradication) and alcoholism complicated by ascites and nonbleeding grade 1 esophageal varices, prior alcohol abuse, IBS, fibromyalgia, COPD, anxiety, hypertension, hypothyroidism, GERD and IBS   Patient presenting today for follow up of cirrhosis/ascites.  Last seen feb 2024, at that time, recent admission at [facility] Dec 2023 with ascites and AKI. Underwent para with 4 liters removed. Negative SBP. admitted at [facility] end of December 12/26-12/30 with recurrent abdominal distension, acute renal failure with creatinine 2.3 but improved to 0.93 on day of discharge. She underwent another para with 5 liters removed. No alcohol since Nov.    increased Lasix to 40 mg and spironolactone to 100 mg daily around 2/12 but patient decreased herself back to lasix '20mg'$  and spironolactone '50mg'$  daily.  Daughter had messaged concerned about weight gain as she had lost down to 132 as outpatient then up to 146/147. Upcoming new patient eval with Roosevelt Locks, NP, on 3/13. RUQ [place] due in July 2024.   She was contineud on lasix '20mg'$  and spironolactone '50mg'$  daily, increase lactulose to 2-3x/day, keep upcoming appt with transplant team, repeat EGD 08/2024  Last paracentesis on 3/6, yield 4L, no SBP Last AFP 2/27 4.8/INR 1.9 on 2/15  CMp on 2/27 with creat 1.03, sodium 129, potassium 3.7, albumin 3.1, ast 72, alt  56, t bili 5.6 [place] abd with doppler 08/25/22 with no focal lesions, patent portal veinous system  Patient's daughter called the office on 3/11 requesting another paracentesis as she was having ascites, patient set up for repeat paracentesis with no more than 4L to be drawn off.  Present:  She notes that she is doing lasix '20mg'$  and spironolactone '50mg'$  daily. She notes some improvement after paracentesis though felt ascites increasing again within a few days. Appetite is good. She notes tightness in her abdomen at times but no pain. She has occasional SOB. Is trying to do small meals throughout the day. She denies nausea or vomiting. No recent etoh, last was thanksgiving. Having 2-3 BMs per day most day though notes some issues with chronic constipation. Has been maintained on linzess 183mg daily along with her lactulose which she feels works well for her. She does note that she has some forgetfulness at times. She is doing lactulose usually twice per day, sometimes takes MOM in place of the lactulose.   She did speak with IR today who advised her they could see her for paracentesis today after her visit with u[place]if it was decided that paracentesis was warranted.     Last EGD:Jan 2024   Grade I esophageal varices.                           - Portal hypertensive gastropathy.                           -  Normal examined duodenum.                           - No specimens collected.  Recommendations:    Past Medical History:  Diagnosis Date   Anxiety    Cirrhosis ([place])    COPD (chronic obstructive pulmonary disease) (HCC)    Fibromyalgia    GERD (gastroesophageal reflux disease)    Heart murmur    Hepatitis C    Hypertension    Hypotension    Hypothyroid    Migraines    MRSA (methicillin resistant staph aureus) culture positive    PONV (postoperative nausea and vomiting)    difficulty waking up after anesthesia    Past Surgical History:  Procedure Laterality Date   ABDOMINAL  HYSTERECTOMY     partial   BACK SURGERY     lumbar removal of disc   BIOPSY  09/06/2014   Procedure: BIOPSY;  Surgeon: Rogene Houston, MD;  Location: [facility];  Service: Endoscopy;;   CHOLECYSTECTOMY     COLONOSCOPY WITH PROPOFOL N/A 09/06/2014   Procedure: COLONOSCOPY WITH PROPOFOL (Procedure #2) At cecum at 0819; total withdrawal time=13 minutes;  Surgeon: Rogene Houston, MD;  Location: [facility];  Service: Endoscopy;  Laterality: N/A;   ESOPHAGEAL DILATION N/A 09/06/2014   Procedure: ESOPHAGEAL DILATION WITH MALONEY DILATOR 56MM;  Surgeon: Rogene Houston, MD;  Location: [facility];  Service: Endoscopy;  Laterality: N/A;   ESOPHAGOGASTRODUODENOSCOPY (EGD) WITH PROPOFOL N/A 09/06/2014   Procedure: ESOPHAGOGASTRODUODENOSCOPY (EGD) WITH PROPOFOL (Procedure #1) ;  Surgeon: Rogene Houston, MD;  Location: [facility];  Service: Endoscopy;  Laterality: N/A;   ESOPHAGOGASTRODUODENOSCOPY (EGD) WITH PROPOFOL N/A 09/02/2021   Procedure: ESOPHAGOGASTRODUODENOSCOPY (EGD) WITH PROPOFOL;  Surgeon: Harvel Quale, MD;  Location: [facility];  Service: Gastroenterology;  Laterality: N/A;  830   ESOPHAGOGASTRODUODENOSCOPY (EGD) WITH PROPOFOL N/A 09/02/2022   Procedure: ESOPHAGOGASTRODUODENOSCOPY (EGD) WITH PROPOFOL;  Surgeon: Harvel Quale, MD;  Location: [facility];  Service: Gastroenterology;  Laterality: N/A;  1030am, asa 3   HEMORRHOID SURGERY     Dr Raelyn Ensign   IR RADIOLOGIST EVAL & MGMT  08/26/2022   IR RADIOLOGIST EVAL & MGMT  09/22/2022   PARTIAL HYSTERECTOMY      One ovary left   RECTAL PROLAPSE REPAIR     WRIST SURGERY  1997    Current Outpatient Medications  Medication Sig Dispense Refill   albuterol (PROVENTIL) (2.5 MG/3ML) 0.083% nebulizer solution Take 3 mLs (2.5 mg total) by nebulization every 2 (two) hours as needed for wheezing. 75 mL 5   albuterol (VENTOLIN HFA) 108 (90 Base) MCG/ACT inhaler Inhale 2 puffs into the lungs every 6 (six) hours as needed for wheezing or  shortness of breath. 18 g 2   famotidine (PEPCID) 20 MG tablet Take 1 tablet (20 mg total) by mouth at bedtime. 90 tablet 3   folic acid (FOLVITE) 1 MG tablet Take 1 tablet (1 mg total) by mouth daily. 30 tablet 3   furosemide (LASIX) 40 MG tablet Take 0.5 tablets (20 mg total) by mouth daily. 90 tablet 1   hydrOXYzine (ATARAX) 25 MG tablet Take 1 tablet (25 mg total) by mouth every 8 (eight) hours as needed for anxiety or nausea. 30 tablet 1   lactulose (CHRONULAC) 10 GM/15ML solution Take 15 mLs (10 g total) by mouth daily. Goal is to have 2 to 3 Mushy/Loose stools per Day 473 mL 2  levothyroxine (SYNTHROID) 88 MCG tablet Take 1 tablet (88 mcg total) by mouth daily before breakfast. 30 tablet 3   linaclotide (LINZESS) 145 MCG CAPS capsule Take 145 mcg by mouth daily before breakfast.     Multiple Vitamins-Minerals (MULTIVITAMIN WITH MINERALS) tablet Take 1 tablet by mouth daily. 120 tablet 2   ondansetron (ZOFRAN) 4 MG tablet Take 1 tablet (4 mg total) by mouth every 6 (six) hours as needed for nausea. 60 tablet 5   pantoprazole (PROTONIX) 40 MG tablet Take 1 tablet (40 mg total) by mouth 2 (two) times daily. 180 tablet 2   spironolactone (ALDACTONE) 100 MG tablet Take 0.5 tablets (50 mg total) by mouth daily. 90 tablet 1   thiamine (VITAMIN B-1) 100 MG tablet Take 1 tablet (100 mg total) by mouth daily. 100 tablet 2   No current facility-administered medications for this visit.    Allergies as of 10/19/2022   (No Known Allergies)    Family History  Problem Relation Age of Onset   Colon cancer Neg Hx    Colon polyps Neg Hx     Social History   Socioeconomic History   Marital status: Widowed    Spouse name: Not on file   Number of children: Not on file   Years of education: Not on file   Highest education level: Not on file  Occupational History   Not on file  Tobacco Use   Smoking status: Former    Packs/day: 0.25    Years: 30.00    Total pack years: 7.50    Types:  Cigarettes    Passive exposure: Past   Smokeless tobacco: Never   Tobacco comments:    3-4 cigs a day since age 23.  Vaping Use   Vaping Use: Never used  Substance and Sexual Activity   Alcohol use: Not Currently    Comment: used to beer nightly not sure of how many   Drug use: No   Sexual activity: Yes    Birth control/protection: Surgical  Other Topics Concern   Not on file  Social History Narrative   Not on file   Social Determinants of Health   Financial Resource Strain: Not on file  Food Insecurity: Not on file  Transportation Needs: Not on file  Physical Activity: Not on file  Stress: Not on file  Social Connections: Not on file    Review of systems General: negative for malaise, night sweats, fever, chills, weight loss Neck: Negative for lumps, goiter, pain and significant neck swelling Resp: Negative for cough, wheezing, dyspnea at rest CV: Negative for chest pain, leg swelling, palpitations, orthopnea GI: denies melena, hematochezia, nausea, vomiting, diarrhea, constipation, dysphagia, odyonophagia, early satiety or unintentional weight loss.  MSK: Negative for joint pain or swelling, back pain, and muscle pain. Derm: Negative for itching or rash Psych: Denies depression, anxiety, memory loss, confusion. No homicidal or suicidal ideation.  Heme: Negative for prolonged bleeding, bruising easily, and swollen nodes. Endocrine: Negative for cold or heat intolerance, polyuria, polydipsia and goiter. Neuro: negative for tremor, gait imbalance, syncope and seizures. The remainder of the review of systems is noncontributory.  Physical Exam: BP 106/63 (BP Location: Left Arm, Patient Position: Sitting, Cuff Size: Normal)   Pulse 86   Temp 99.2 F (37.3 C) (Oral)   Ht '5\' 3"'$  (1.6 m)   Wt 143 lb 1.6 oz (64.9 kg)   BMI 25.35 kg/m  General:   Alert and oriented. No distress noted. Pleasant and cooperative.  Head:  Normocephalic and atraumatic. Eyes:  scleral icterus   Mouth:  Oral mucosa pink and moist. Good dentition. No lesions. Heart: Normal rate and rhythm, s1 and s2 heart sounds present.  Lungs: Clear lung sounds in all lobes. Respirations equal and unlabored. Abdomen:  +BS. Abdomen is full but soft, no tense ascites.  Derm: +jaundice Msk:  Symmetrical without gross deformities. Normal posture. Extremities:  Without edema. Neurologic:  Alert and  oriented x4. No asterixis  Psych:  Alert and cooperative. Normal mood and affect.  Invalid input(s): "6 MONTHS"   ASSESSMENT: Karla Young is a 62 y.o. female presenting today for follow up of her cirrhosis with ascites.  Paracentesis done on 3/6, patient reports noting fluid accumulation over the past few days since para. On exam today her abdomen is full but soft, no tense ascites. She had previously spoken with IR and was advised to come over for paracentesis after her OV with me, however, I think we can hold off on paracentesis today as abdomen is full but soft, no tense ascites. She is not SOB. Will increase lasix to '40mg'$  and spironolactone to '100mg'$  daily with repeat BMP on 3/18. Discussed strict 2g sodium diet, continued abstinence from ETOH. She should call with an update in a few days on how her ascites is as she may require paracentesis if increase in diuretic therapy is not improving things. She has appt with transplant team tomorrow which she needs to keep.    She notes some episodes of forgetfulness, though taking lactulose usually only twice per day, recommend taking lactulose 14m TID. If she continues to have forgetfulness on TID dosing, may need to increase to 388m She is a/ox4 today with no asterixis.   She is requesting samples of linzess as she has been on the 14564mdaily dosing due to chronic constipation, she feels she needs this even with the lactulose. Will provide samples today as insurance does not cover this for her.   [facility] as well as  AFP, CBC and MELD labs. Last EGD 08/2022, due for repeat 08/2024  PLAN:  RUQ US [place] in July 2024.  2.  Increase lasix to '40mg'$  and spironolactone to '100mg'$  daily 3. Strict 2g sodium diet  4. Lactulose 61m80mD 5. BMP on 3/18 6. Keep appt with transplant team tomorrow 7. Pt to call with update on how ascites is in the next few days 8. Continued ETOH abstinence  All questions were answered, patient verbalized understanding and is in agreement with plan as outlined above.   Follow Up: Keep scheduled f/u in April 2024  Timotheus Salm L. CarlAlver SorrowN, APRN, AGNP-C Adult-Gerontology Nurse Practitioner [facility]

## 2022-10-19 NOTE — Patient Instructions (Addendum)
Please Increase lasix to '40mg'$  and spironolactone to '100mg'$  daily Strict 2 gram sodium diet, it is important you reduce salt intake  Take Lactulose 6m 3x/day, let me know if you are having episodes of confusion/forgetfulness We will hold off on paracentesis today, if fluid in your belly is not improving within the next few days with increase in fluid pills, please let u[place]oreaknow We will repeat blood work around 3/18 Keep appt with transplant team tomorrow  You have a follow up scheduled with ARoseanne Kaufmanin April

## 2022-10-20 DIAGNOSIS — K7469 Other cirrhosis of liver: Secondary | ICD-10-CM | POA: Diagnosis not present

## 2022-10-20 DIAGNOSIS — R188 Other ascites: Secondary | ICD-10-CM | POA: Diagnosis not present

## 2022-10-20 DIAGNOSIS — M797 Fibromyalgia: Secondary | ICD-10-CM | POA: Insufficient documentation

## 2022-10-20 DIAGNOSIS — F1010 Alcohol abuse, uncomplicated: Secondary | ICD-10-CM | POA: Diagnosis not present

## 2022-10-20 DIAGNOSIS — K7682 Hepatic encephalopathy: Secondary | ICD-10-CM | POA: Diagnosis not present

## 2022-10-20 DIAGNOSIS — E440 Moderate protein-calorie malnutrition: Secondary | ICD-10-CM | POA: Diagnosis not present

## 2022-10-20 DIAGNOSIS — I8510 Secondary esophageal varices without bleeding: Secondary | ICD-10-CM | POA: Diagnosis not present

## 2022-10-23 DIAGNOSIS — E440 Moderate protein-calorie malnutrition: Secondary | ICD-10-CM | POA: Insufficient documentation

## 2022-10-25 ENCOUNTER — Telehealth: Payer: Self-pay

## 2022-10-25 NOTE — Telephone Encounter (Signed)
Consultation note from [facility] scanned under media for review.

## 2022-10-26 ENCOUNTER — Other Ambulatory Visit (HOSPITAL_COMMUNITY)
Admission: RE | Admit: 2022-10-26 | Discharge: 2022-10-26 | Disposition: A | Discharge status: Home / Self Care | Payer: 59 | Source: Ambulatory Visit | Attending: Gastroenterology | Admitting: Gastroenterology

## 2022-10-26 ENCOUNTER — Ambulatory Visit (HOSPITAL_COMMUNITY)
Admission: RE | Admit: 2022-10-26 | Discharge: 2022-10-26 | Disposition: A | Discharge status: Home / Self Care | Payer: 59 | Source: Ambulatory Visit | Attending: Gastroenterology | Admitting: Gastroenterology

## 2022-10-26 ENCOUNTER — Encounter (HOSPITAL_COMMUNITY): Payer: Self-pay

## 2022-10-26 DIAGNOSIS — R188 Other ascites: Secondary | ICD-10-CM | POA: Insufficient documentation

## 2022-10-26 DIAGNOSIS — K7460 Unspecified cirrhosis of liver: Secondary | ICD-10-CM

## 2022-10-26 LAB — BASIC METABOLIC PANEL
Anion gap: 9 (ref 5–15)
BUN: 16 mg/dL (ref 8–23)
CO2: 23 mmol/L (ref 22–32)
Calcium: 9.3 mg/dL (ref 8.9–10.3)
Chloride: 97 mmol/L — ABNORMAL LOW (ref 98–111)
Creatinine, Ser: 0.99 mg/dL (ref 0.44–1.00)
GFR, Estimated: >60 mL/min (ref >60)
Glucose, Bld: 97 mg/dL (ref 70–99)
Potassium: 4.1 mmol/L (ref 3.5–5.1)
Sodium: 129 mmol/L — ABNORMAL LOW (ref 135–145)

## 2022-10-26 LAB — BODY FLUID CELL COUNT WITH DIFFERENTIAL
Eos, Fluid: 0 %
Lymphs, Fluid: 60 %
Monocyte-Macrophage-Serous Fluid: 32 % — ABNORMAL LOW (ref 50–90)
Neutrophil Count, Fluid: 8 % (ref 0–25)
Total Nucleated Cell Count, Fluid: 85 cu mm (ref 0–1000)

## 2022-10-26 NOTE — Procedures (Signed)
PreOperative Dx: Cirrhosis, ascites °Postoperative Dx: Cirrhosis, ascites °Procedure:   US guided paracentesis °Radiologist:  Tedric Leeth °Anesthesia:  10 ml of1% lidocaine °Specimen:  4 L of yellow ascitic fluid °EBL:   < 1 ml °Complications: None   °

## 2022-10-26 NOTE — Progress Notes (Signed)
Patient tolerated right sided paracentesis procedure well today and 4 Liters of clear yellow ascites removed with labs collected and sent for processing. Patient verbalized understanding of discharge instructions and ambulatory at departure with no acute distress noted.

## 2022-10-28 LAB — PATHOLOGIST SMEAR REVIEW

## 2022-11-04 NOTE — Telephone Encounter (Signed)
noted 

## 2022-11-10 ENCOUNTER — Telehealth (INDEPENDENT_AMBULATORY_CARE_PROVIDER_SITE_OTHER): Payer: Self-pay | Admitting: Gastroenterology

## 2022-11-10 DIAGNOSIS — K7469 Other cirrhosis of liver: Secondary | ICD-10-CM | POA: Diagnosis not present

## 2022-11-10 NOTE — Telephone Encounter (Signed)
Gabriel Rung, NP  Floria Raveling I, CMA; Vicente Males, LPN She should not need labs right now.  Lavella Lemons, can we get patient set up for screening colonoscopy with Dr. Jenetta Downer, this is part of her liver transplant workup, ASA III

## 2022-11-11 NOTE — Telephone Encounter (Signed)
Per Carolyn-You can put someone at the end of those cases (for 11/16/22)

## 2022-11-11 NOTE — Telephone Encounter (Signed)
Contacted pt to set up TCS-looked to be an opening on 11/16/22 but looking at [employer] there is not an opening. Sent message to endo about putting pt on for 11/16/22. Informed pt I would call her back after hearing from Endo.  No message from Endo at this time-next available will by May 7. Left message to return call to see if May 7 would work for her. From pt chart, pt has appt with transplant on 11/25/22

## 2022-11-15 ENCOUNTER — Telehealth (INDEPENDENT_AMBULATORY_CARE_PROVIDER_SITE_OTHER): Payer: Self-pay | Admitting: Gastroenterology

## 2022-11-15 ENCOUNTER — Encounter (INDEPENDENT_AMBULATORY_CARE_PROVIDER_SITE_OTHER): Payer: Self-pay

## 2022-11-15 DIAGNOSIS — K7469 Other cirrhosis of liver: Secondary | ICD-10-CM | POA: Diagnosis not present

## 2022-11-15 MED ORDER — PEG 3350-KCL-NA BICARB-NACL 420 G PO SOLR: 4000.0000 mL | Freq: Once | ORAL | 0 refills | Status: AC

## 2022-11-15 NOTE — Addendum Note (Signed)
Addended by: Marlowe Shores on: 11/15/2022 09:02 AM   Modules accepted: Orders

## 2022-11-15 NOTE — Telephone Encounter (Signed)
Moore, Mitzie S4 minutes ago (8:20 AM)   MM Patient returned your call    Note

## 2022-11-15 NOTE — Telephone Encounter (Signed)
Opening came available for colonoscopy on 11/22/22. Pt contacted and asked if she would like to move colonoscopy up. Pt states she would. New instructions printed and placed up front. Will let pt know of pre op time.

## 2022-11-15 NOTE — Telephone Encounter (Signed)
Patient returned your call.

## 2022-11-15 NOTE — Telephone Encounter (Signed)
Pt called in before I could return call. Pt was under impression that she had been scheduled for colonoscopy 11/16/22. Advised pt that she was not scheduled for tomorrow. (Pt did not return call so I did not put pt on schedule). Pt states she is needing the colonoscopy by 11/25/22 when she meets with transplant team. Advised pt only availability we have at the moment is early May but will put her on cancellation list.  Pt scheduled for 12/10/22. Advised pt that if the transplant team has any question/concerns they could give office a call. Instructions sent via mail and prep sent to pharmacy.  Will call pt with pre op appt.

## 2022-11-16 ENCOUNTER — Telehealth (INDEPENDENT_AMBULATORY_CARE_PROVIDER_SITE_OTHER): Payer: Self-pay | Admitting: Gastroenterology

## 2022-11-16 NOTE — Telephone Encounter (Signed)
Pt contacted and made aware of pre op appt. Pt also asked for Linzess samples. 3 boxes up front awaiting pickup from pt.

## 2022-11-17 ENCOUNTER — Telehealth: Payer: Self-pay | Admitting: Gastroenterology

## 2022-11-17 ENCOUNTER — Other Ambulatory Visit: Payer: Self-pay | Admitting: *Deleted

## 2022-11-17 DIAGNOSIS — R188 Other ascites: Secondary | ICD-10-CM

## 2022-11-17 NOTE — Telephone Encounter (Signed)
Pt informed para scheduled for 11/18/22 at 10 am, arrive at 9:45 am to check in. Verbalized understanding

## 2022-11-17 NOTE — Telephone Encounter (Signed)
Pt called once again wanting to know have the orders been put in for her para. Please call her

## 2022-11-17 NOTE — Telephone Encounter (Signed)
Pt called checking on status of the para. She thought she had standing orders.

## 2022-11-17 NOTE — Telephone Encounter (Signed)
Please arrange [place] para standing orders up to 3 times.   Max 5 liters removed, needs 25 grams IV albumin at start. Cell count requested.

## 2022-11-17 NOTE — Telephone Encounter (Signed)
Noted  

## 2022-11-17 NOTE — Telephone Encounter (Signed)
Pt called asking to have a para done today and needs standing orders. Please call [phone]

## 2022-11-17 NOTE — Patient Instructions (Signed)
Karla Young  11/17/2022     @PREFPERIOPPHARMACY @   Your procedure is scheduled on  11/22/2022.   Report to [facility] at  0710  A.M.   Call this number if you have problems the morning of surgery:  [phone]  If you experience any cold or flu symptoms such as cough, fever, chills, shortness of breath, etc. between now and your scheduled surgery, please notify us at the above number.   Remember:  Follow the diet and prep instructions given to you by the office.      Use your nebulizer and your inhaler before you come and brig your rescue inhaler with you.     Take these medicines the morning of surgery with A SIP OF WATER           hydroxyzine, levothyroxine, zofran (if needed), pantoprazole.    Do not wear jewelry, make-up or nail polish.  Do not wear lotions, powders, or perfumes, or deodorant.  Do not shave 48 hours prior to surgery.  Men may shave face and neck.  Do not bring valuables to the hospital.  [facility] is not responsible for any belongings or valuables.  Contacts, dentures or bridgework may not be worn into surgery.  Leave your suitcase in the car.  After surgery it may be brought to your room.  For patients admitted to the hospital, discharge time will be determined by your treatment team.  Patients discharged the day of surgery will not be allowed to drive home and must have someone with them for 24 hours.    Special instructions:   DO NOT smoke tobacco or vape for 24 hours before your procedure.  Please read over the following fact sheets that you were given. Anesthesia Post-op Instructions and Care and Recovery After Surgery      Colonoscopy, Adult, Care After The following information offers guidance on how to care for yourself after your procedure. Your health care provider may also give you more specific instructions. If you have problems or questions, contact your health care provider. What can I expect after the procedure? After the  procedure, it is common to have: A small amount of blood in your stool for 24 hours after the procedure. Some gas. Mild cramping or bloating of your abdomen. Follow these instructions at home: Eating and drinking  Drink enough fluid to keep your urine pale yellow. Follow instructions from your health care provider about eating or drinking restrictions. Resume your normal diet as told by your health care provider. Avoid heavy or fried foods that are hard to digest. Activity Rest as told by your health care provider. Avoid sitting for a long time without moving. Get up to take short walks every 1-2 hours. This is important to improve blood flow and breathing. Ask for help if you feel weak or unsteady. Return to your normal activities as told by your health care provider. Ask your health care provider what activities are safe for you. Managing cramping and bloating  Try walking around when you have cramps or feel bloated. If directed, apply heat to your abdomen as told by your health care provider. Use the heat source that your health care provider recommends, such as a moist heat pack or a heating pad. Place a towel between your skin and the heat source. Leave the heat on for 20-30 minutes. Remove the heat if your skin turns bright red. This is especially important if you are unable to feel pain,  heat, or cold. You have a greater risk of getting burned. General instructions If you were given a sedative during the procedure, it can affect you for several hours. Do not drive or operate machinery until your health care provider says that it is safe. For the first 24 hours after the procedure: Do not sign important documents. Do not drink alcohol. Do your regular daily activities at a slower pace than normal. Eat soft foods that are easy to digest. Take over-the-counter and prescription medicines only as told by your health care provider. Keep all follow-up visits. This is important. Contact  a health care provider if: You have blood in your stool 2-3 days after the procedure. Get help right away if: You have more than a small spotting of blood in your stool. You have large blood clots in your stool. You have swelling of your abdomen. You have nausea or vomiting. You have a fever. You have increasing pain in your abdomen that is not relieved with medicine. These symptoms may be an emergency. Get help right away. Call 911. Do not wait to see if the symptoms will go away. Do not drive yourself to the hospital. Summary After the procedure, it is common to have a small amount of blood in your stool. You may also have mild cramping and bloating of your abdomen. If you were given a sedative during the procedure, it can affect you for several hours. Do not drive or operate machinery until your health care provider says that it is safe. Get help right away if you have a lot of blood in your stool, nausea or vomiting, a fever, or increased pain in your abdomen. This information is not intended to replace advice given to you by your health care provider. Make sure you discuss any questions you have with your health care provider. Document Revised: 03/18/2021 Document Reviewed: 03/18/2021 Elsevier Patient Education  2023 [employer]    The following information offers guidance on how to care for yourself after your procedure. Your health care provider may also give you more specific instructions. If you have problems or questions, contact your health care provider. What can I expect after the procedure? After the procedure, it is common to have: Tiredness. Little or no memory about what happened during or after the procedure. Impaired judgment when it comes to making decisions. Nausea or vomiting. Some trouble with balance. Follow these instructions at home: For the time period you were told by your health care provider:  Rest. Do not participate in activities where you could  fall or become injured. Do not drive or use machinery. Do not drink alcohol. Do not take sleeping pills or medicines that cause drowsiness. Do not make important decisions or sign legal documents. Do not take care of children on your own. Medicines Take over-the-counter and prescription medicines only as told by your health care provider. If you were prescribed antibiotics, take them as told by your health care provider. Do not stop using the antibiotic even if you start to feel better. Eating and drinking Follow instructions from your health care provider about what you may eat and drink. Drink enough fluid to keep your urine pale yellow. If you vomit: Drink clear fluids slowly and in small amounts as you are able. Clear fluids include water, ice chips, low-calorie sports drinks, and fruit juice that has water added to it (diluted fruit juice). Eat light and bland foods in small amounts as you are able. These foods include bananas,  applesauce, rice, lean meats, toast, and crackers. General instructions  Have a responsible adult stay with you for the time you are told. It is important to have someone help care for you until you are awake and alert. If you have sleep apnea, surgery and some medicines can increase your risk for breathing problems. Follow instructions from your health care provider about wearing your sleep device: When you are sleeping. This includes during daytime naps. While taking prescription pain medicines, sleeping medicines, or medicines that make you drowsy. Do not use any products that contain nicotine or tobacco. These products include cigarettes, chewing tobacco, and vaping devices, such as e-cigarettes. If you need help quitting, ask your health care provider. Contact a health care provider if: You feel nauseous or vomit every time you eat or drink. You feel light-headed. You are still sleepy or having trouble with balance after 24 hours. You get a rash. You have  a fever. You have redness or swelling around the IV site. Get help right away if: You have trouble breathing. You have new confusion after you get home. These symptoms may be an emergency. Get help right away. Call 911. Do not wait to see if the symptoms will go away. Do not drive yourself to the hospital. This information is not intended to replace advice given to you by your health care provider. Make sure you discuss any questions you have with your health care provider. Document Revised: 12/21/2021 Document Reviewed: 12/21/2021 Elsevier Patient Education  2023 [employer].

## 2022-11-17 NOTE — Telephone Encounter (Signed)
Karla Young, Pt phoned needing a para. I thought you had them to do standing orders for this pt. Please advise. Pt wants you to put in standing orders for her

## 2022-11-18 ENCOUNTER — Other Ambulatory Visit: Payer: Self-pay

## 2022-11-18 ENCOUNTER — Encounter (HOSPITAL_COMMUNITY): Payer: Self-pay

## 2022-11-18 ENCOUNTER — Telehealth: Payer: Self-pay

## 2022-11-18 ENCOUNTER — Ambulatory Visit (HOSPITAL_COMMUNITY)
Admission: RE | Admit: 2022-11-18 | Discharge: 2022-11-18 | Disposition: A | Discharge status: Home / Self Care | Payer: 59 | Source: Ambulatory Visit | Attending: Gastroenterology | Admitting: Gastroenterology

## 2022-11-18 ENCOUNTER — Encounter (HOSPITAL_COMMUNITY)
Admission: RE | Admit: 2022-11-18 | Discharge: 2022-11-18 | Disposition: A | Discharge status: Home / Self Care | Payer: 59 | Source: Ambulatory Visit | Attending: Gastroenterology | Admitting: Gastroenterology

## 2022-11-18 VITALS — BP 97/58 | HR 57 | Temp 97.8°F | Resp 18 | Ht 63.0 in | Wt 143.1 lb

## 2022-11-18 DIAGNOSIS — K7460 Unspecified cirrhosis of liver: Secondary | ICD-10-CM | POA: Diagnosis not present

## 2022-11-18 DIAGNOSIS — R188 Other ascites: Secondary | ICD-10-CM | POA: Diagnosis not present

## 2022-11-18 DIAGNOSIS — I8510 Secondary esophageal varices without bleeding: Secondary | ICD-10-CM

## 2022-11-18 LAB — CBC WITH DIFFERENTIAL/PLATELET
Abs Immature Granulocytes: 0.02 10*3/uL (ref 0.00–0.07)
Basophils Absolute: 0.1 10*3/uL (ref 0.0–0.1)
Basophils Relative: 2 %
Eosinophils Absolute: 0.3 10*3/uL (ref 0.0–0.5)
Eosinophils Relative: 5 %
HCT: 33.4 % — ABNORMAL LOW (ref 36.0–46.0)
Hemoglobin: 11.2 g/dL — ABNORMAL LOW (ref 12.0–15.0)
Immature Granulocytes: 0 %
Lymphocytes Relative: 23 %
Lymphs Abs: 1.1 10*3/uL (ref 0.7–4.0)
MCH: 29.3 pg (ref 26.0–34.0)
MCHC: 33.5 g/dL (ref 30.0–36.0)
MCV: 87.4 fL (ref 80.0–100.0)
Monocytes Absolute: 0.5 10*3/uL (ref 0.1–1.0)
Monocytes Relative: 10 %
Neutro Abs: 3 10*3/uL (ref 1.7–7.7)
Neutrophils Relative %: 60 %
Platelets: 73 10*3/uL — ABNORMAL LOW (ref 150–400)
RBC: 3.82 MIL/uL — ABNORMAL LOW (ref 3.87–5.11)
RDW: 17.7 % — ABNORMAL HIGH (ref 11.5–15.5)
WBC: 5 10*3/uL (ref 4.0–10.5)
nRBC: 0 % (ref 0.0–0.2)

## 2022-11-18 LAB — BODY FLUID CELL COUNT WITH DIFFERENTIAL
Eos, Fluid: 1 %
Lymphs, Fluid: 67 %
Monocyte-Macrophage-Serous Fluid: 21 % — ABNORMAL LOW (ref 50–90)
Neutrophil Count, Fluid: 11 % (ref 0–25)
Total Nucleated Cell Count, Fluid: 144 cu mm (ref 0–1000)

## 2022-11-18 LAB — COMPREHENSIVE METABOLIC PANEL
ALT: 37 U/L (ref 0–44)
AST: 53 U/L — ABNORMAL HIGH (ref 15–41)
Albumin: 3.8 g/dL (ref 3.5–5.0)
Alkaline Phosphatase: 61 U/L (ref 38–126)
Anion gap: 10 (ref 5–15)
BUN: 19 mg/dL (ref 8–23)
CO2: 21 mmol/L — ABNORMAL LOW (ref 22–32)
Calcium: 9.5 mg/dL (ref 8.9–10.3)
Chloride: 103 mmol/L (ref 98–111)
Creatinine, Ser: 0.81 mg/dL (ref 0.44–1.00)
GFR, Estimated: >60 mL/min (ref >60)
Glucose, Bld: 80 mg/dL (ref 70–99)
Potassium: 3.8 mmol/L (ref 3.5–5.1)
Sodium: 134 mmol/L — ABNORMAL LOW (ref 135–145)
Total Bilirubin: 4.3 mg/dL — ABNORMAL HIGH (ref 0.3–1.2)
Total Protein: 6.8 g/dL (ref 6.5–8.1)

## 2022-11-18 MED ORDER — LACTULOSE 10 GM/15ML PO SOLN: 10.0000 g | Freq: Three times a day (TID) | ORAL | 1 refills | Status: DC

## 2022-11-18 MED ORDER — ALBUMIN HUMAN 25 % IV SOLN
0.0000 g | Freq: Once | INTRAVENOUS | Status: AC
  Administered 2022-11-18: 50 g via INTRAVENOUS
  Filled 2022-11-18: qty 400

## 2022-11-18 MED ORDER — ALBUMIN HUMAN 25 % IV SOLN
INTRAVENOUS | Status: AC
  Filled 2022-11-18: qty 200

## 2022-11-18 NOTE — Progress Notes (Signed)
Pt arrived for PAT visit and verbalized understanding for procedure and arrival time.  Prep instructions given to pt with explanation.

## 2022-11-18 NOTE — Telephone Encounter (Signed)
Chaya Jan, this patient last saw [facility] 10/19/22. Should go to provider who last saw patient. I sent in refill so patient did not go without.

## 2022-11-18 NOTE — Progress Notes (Signed)
Patient tolerated right sided paracentesis procedure and 50G of IV albumin well today and 3.6 Liters of ascites removed with labs collected and sent for processing. Patient verbalized understanding of discharge instructions and left ambulatory with no acute distress noted.

## 2022-11-18 NOTE — Addendum Note (Signed)
Addended by: Tiffany Kocher on: 11/18/2022 09:16 PM   Modules accepted: Orders

## 2022-11-18 NOTE — Procedures (Signed)
   RLQ US guided paracentesis  3.6 L yellow fluid obtained Sent for labs per MD  Tolerated well  EBL: scant

## 2022-11-18 NOTE — Telephone Encounter (Signed)
Pt is calling for a refill on her Lactulose to be sent to [facility] in [place]. Her last ov was 09-23-2022. Please advise

## 2022-11-19 ENCOUNTER — Encounter (HOSPITAL_COMMUNITY): Admission: RE | Admit: 2022-11-19 | Payer: Medicaid Other | Source: Ambulatory Visit

## 2022-11-21 ENCOUNTER — Encounter (HOSPITAL_COMMUNITY): Payer: Self-pay | Admitting: Anesthesiology

## 2022-11-22 ENCOUNTER — Encounter (HOSPITAL_COMMUNITY): Admission: RE | Payer: Self-pay | Source: Ambulatory Visit

## 2022-11-22 ENCOUNTER — Telehealth (INDEPENDENT_AMBULATORY_CARE_PROVIDER_SITE_OTHER): Payer: Self-pay | Admitting: Gastroenterology

## 2022-11-22 ENCOUNTER — Ambulatory Visit (HOSPITAL_COMMUNITY): Admission: RE | Admit: 2022-11-22 | Payer: 59 | Source: Ambulatory Visit | Admitting: Gastroenterology

## 2022-11-22 DIAGNOSIS — Z1211 Encounter for screening for malignant neoplasm of colon: Secondary | ICD-10-CM

## 2022-11-22 DIAGNOSIS — I8510 Secondary esophageal varices without bleeding: Secondary | ICD-10-CM

## 2022-11-22 SURGERY — COLONOSCOPY WITH PROPOFOL
Anesthesia: Monitor Anesthesia Care

## 2022-11-22 MED ORDER — PEG 3350-KCL-NA BICARB-NACL 420 G PO SOLR: 4000.0000 mL | Freq: Once | ORAL | 0 refills | Status: AC

## 2022-11-22 NOTE — Telephone Encounter (Signed)
Pt left voicemail stating she needs sooner appt for colonoscopy. Pt is going to transplant appt on 11/25/22 and states she need colonoscopy done before then. No sooner appts available. Pt has been scheduled/rescheduled twice already.

## 2022-11-22 NOTE — Telephone Encounter (Signed)
Pt left message on voicemail stating that she needed to cancel her procedure due to prep getting spilled.   Contact pt and she has rescheduled to 12/17/22. Updated instructions sent via mail and prep sent to pharmacy

## 2022-11-22 NOTE — Telephone Encounter (Signed)
Pt has been rescheduled and prep sent to pharmacy

## 2022-11-23 ENCOUNTER — Encounter: Payer: Self-pay | Admitting: Gastroenterology

## 2022-11-23 ENCOUNTER — Ambulatory Visit: Payer: Medicaid Other | Admitting: Gastroenterology

## 2022-11-25 DIAGNOSIS — K7469 Other cirrhosis of liver: Secondary | ICD-10-CM | POA: Diagnosis not present

## 2022-12-01 ENCOUNTER — Encounter (HOSPITAL_COMMUNITY): Payer: Self-pay

## 2022-12-01 ENCOUNTER — Ambulatory Visit (HOSPITAL_COMMUNITY)
Admission: RE | Admit: 2022-12-01 | Discharge: 2022-12-01 | Disposition: A | Discharge status: Home / Self Care | Payer: 59 | Source: Ambulatory Visit | Attending: Gastroenterology | Admitting: Gastroenterology

## 2022-12-01 DIAGNOSIS — K7460 Unspecified cirrhosis of liver: Secondary | ICD-10-CM | POA: Diagnosis not present

## 2022-12-01 DIAGNOSIS — R188 Other ascites: Secondary | ICD-10-CM | POA: Insufficient documentation

## 2022-12-01 LAB — BODY FLUID CELL COUNT WITH DIFFERENTIAL
Eos, Fluid: 0 %
Lymphs, Fluid: 57 %
Monocyte-Macrophage-Serous Fluid: 34 % — ABNORMAL LOW (ref 50–90)
Neutrophil Count, Fluid: 9 % (ref 0–25)
Total Nucleated Cell Count, Fluid: 116 cu mm (ref 0–1000)

## 2022-12-01 MED ORDER — ALBUMIN HUMAN 25 % IV SOLN
0.0000 g | Freq: Once | INTRAVENOUS | Status: AC
  Administered 2022-12-01: 25 g via INTRAVENOUS

## 2022-12-01 MED ORDER — ALBUMIN HUMAN 25 % IV SOLN
INTRAVENOUS | Status: AC
  Filled 2022-12-01: qty 100

## 2022-12-01 NOTE — Procedures (Signed)
PROCEDURE SUMMARY:  Successful ultrasound guided paracentesis from the right lower quadrant.  Yielded 1.4 L of clear yellow fluid.  No immediate complications.  The patient tolerated the procedure well.   Specimen sent for labs.  EBL < 2 mL  The patient has previously been evaluated by the [facility], and deemed not a candidate for intervention.   ** Not a candidate at this time due to high MELD, specifically hyperbilirubinemia.   Alwyn Ren, [place] [phone] 12/01/2022, 11:02 AM

## 2022-12-01 NOTE — Progress Notes (Signed)
Patient tolerated right sided paracentesis procedure and 25G of IV albumin well today and 1.4 Liters of yellow ascites removed with labs collected and sent for processing. Patient verbalized understanding of discharge instructions and ambulatory at departure with no acute distress noted.

## 2022-12-03 LAB — PATHOLOGIST SMEAR REVIEW

## 2022-12-14 ENCOUNTER — Telehealth (INDEPENDENT_AMBULATORY_CARE_PROVIDER_SITE_OTHER): Payer: Self-pay | Admitting: Gastroenterology

## 2022-12-14 NOTE — Telephone Encounter (Signed)
Pt called into office stating she had lost her instructions for colonoscopy.  Went over instructions multiple times with patient. Will send instructions via my chart. Pt is aware that she will have a telephone pre op appt.

## 2022-12-15 ENCOUNTER — Encounter (INDEPENDENT_AMBULATORY_CARE_PROVIDER_SITE_OTHER): Payer: Self-pay

## 2022-12-15 ENCOUNTER — Encounter (HOSPITAL_COMMUNITY)
Admission: RE | Admit: 2022-12-15 | Discharge: 2022-12-15 | Disposition: A | Discharge status: Home / Self Care | Payer: 59 | Source: Ambulatory Visit | Attending: Gastroenterology | Admitting: Gastroenterology

## 2022-12-15 ENCOUNTER — Ambulatory Visit: Payer: Medicaid Other | Admitting: Nutrition

## 2022-12-15 NOTE — Pre-Procedure Instructions (Signed)
Attempted pre-op phonecall. Left VM for her to call us back. 

## 2022-12-15 NOTE — Pre-Procedure Instructions (Signed)
  RE: contact patient please Received: Today Marlowe Shores, LPN  Elsie Amis, RN Good morning. I figured out what the problem is. She is suppose to be on for the 12/16/22. Her letter does say 12/17/22. I specifically remember the day I scheduled her was very busy. She says she can not do it tomorrow because she can't drink her gallon prep by tomorrow. I talked to her yesterday about her instructions and she said I thought I was on for the 9th but I was confused when you kept saying the 10th. I asked her why she did not correct me when she knew it was suppose to be the 9th. She said well I figured something had changed on yalls end. I'm going to call her and see if we can reschedule.       Previous Messages    ----- Message ----- From: Elsie Amis, RN Sent: 12/15/2022  11:36 AM EDT To: Mindy S Estudillo, CMA; Marlowe Shores, LPN Subject: contact patient please                        Good morning! I just spoke with Karla Young who  is scheduled for Dr Alto Denver on tomorrow. She did not stop eating yesterday at 2 and has eaten today. She said, "I was told by the GI nurse that my procedure was on the tenth and that I dont start my prep until the 9th. If you look on Mychart, that is what is says there." I explained that I did not have access to her my chart and that she was on our schedule for  12/16/2022. Will you please contact her and see where the mix up is. Her letter shows 5/10 as well. Thank you!

## 2022-12-15 NOTE — Telephone Encounter (Signed)
Elsie Amis, RN  Estudillo, Mindy S, CMA; Marlowe Shores, LPN Good morning! I just spoke with Karla Young who  is scheduled for Dr Alto Denver on tomorrow. She did not stop eating yesterday at 2 and has eaten today. She said, "I was told by the GI nurse that my procedure was on the tenth and that I dont start my prep until the 9th. If you look on Mychart, that is what is says there." I explained that I did not have access to her my chart and that she was on our schedule for  12/16/2022. Will you please contact her and see where the mix up is. Her letter shows 5/10 as well. Thank you!  There was a mix up on the date; the letter states 12/17/22 but booking was 12/16/22. That was a mistake on my end. I have called patient and made her aware and apologized for my mistake. Pt has been rescheduled to 12/30/22 at 2:15 pm. Will send updated instructions to my chart. Will also send letter to [facility] team letting them know that there was a mix up with the dates of TCS and that she has been rescheduled.

## 2022-12-25 ENCOUNTER — Emergency Department (HOSPITAL_COMMUNITY)
Admission: EM | Admit: 2022-12-25 | Discharge: 2022-12-26 | Disposition: A | Discharge status: Home / Self Care | Payer: 59 | Attending: Emergency Medicine | Admitting: Emergency Medicine

## 2022-12-25 DIAGNOSIS — Z87891 Personal history of nicotine dependence: Secondary | ICD-10-CM | POA: Insufficient documentation

## 2022-12-25 DIAGNOSIS — I10 Essential (primary) hypertension: Secondary | ICD-10-CM | POA: Insufficient documentation

## 2022-12-25 DIAGNOSIS — E039 Hypothyroidism, unspecified: Secondary | ICD-10-CM | POA: Insufficient documentation

## 2022-12-25 DIAGNOSIS — R188 Other ascites: Secondary | ICD-10-CM | POA: Diagnosis not present

## 2022-12-25 DIAGNOSIS — R109 Unspecified abdominal pain: Secondary | ICD-10-CM | POA: Diagnosis not present

## 2022-12-25 DIAGNOSIS — J449 Chronic obstructive pulmonary disease, unspecified: Secondary | ICD-10-CM | POA: Diagnosis not present

## 2022-12-26 ENCOUNTER — Encounter (HOSPITAL_COMMUNITY): Payer: Self-pay | Admitting: Emergency Medicine

## 2022-12-26 ENCOUNTER — Other Ambulatory Visit: Payer: Self-pay

## 2022-12-26 DIAGNOSIS — R188 Other ascites: Secondary | ICD-10-CM | POA: Diagnosis not present

## 2022-12-26 LAB — URINALYSIS, ROUTINE W REFLEX MICROSCOPIC
Glucose, UA: NEGATIVE mg/dL
Hgb urine dipstick: NEGATIVE
Ketones, ur: NEGATIVE mg/dL
Leukocytes,Ua: NEGATIVE
Nitrite: NEGATIVE
Protein, ur: NEGATIVE mg/dL
Specific Gravity, Urine: 1.025 (ref 1.005–1.030)
pH: 6 (ref 5.0–8.0)

## 2022-12-26 LAB — COMPREHENSIVE METABOLIC PANEL
ALT: 33 U/L (ref 0–44)
AST: 55 U/L — ABNORMAL HIGH (ref 15–41)
Albumin: 3 g/dL — ABNORMAL LOW (ref 3.5–5.0)
Alkaline Phosphatase: 101 U/L (ref 38–126)
Anion gap: 7 (ref 5–15)
BUN: 18 mg/dL (ref 8–23)
CO2: 24 mmol/L (ref 22–32)
Calcium: 9.2 mg/dL (ref 8.9–10.3)
Chloride: 101 mmol/L (ref 98–111)
Creatinine, Ser: 0.97 mg/dL (ref 0.44–1.00)
GFR, Estimated: >60 mL/min (ref >60)
Glucose, Bld: 99 mg/dL (ref 70–99)
Potassium: 4.1 mmol/L (ref 3.5–5.1)
Sodium: 132 mmol/L — ABNORMAL LOW (ref 135–145)
Total Bilirubin: 4.5 mg/dL — ABNORMAL HIGH (ref 0.3–1.2)
Total Protein: 6.5 g/dL (ref 6.5–8.1)

## 2022-12-26 LAB — CBC
HCT: 33.7 % — ABNORMAL LOW (ref 36.0–46.0)
Hemoglobin: 11.4 g/dL — ABNORMAL LOW (ref 12.0–15.0)
MCH: 29.9 pg (ref 26.0–34.0)
MCHC: 33.8 g/dL (ref 30.0–36.0)
MCV: 88.5 fL (ref 80.0–100.0)
Platelets: 90 10*3/uL — ABNORMAL LOW (ref 150–400)
RBC: 3.81 MIL/uL — ABNORMAL LOW (ref 3.87–5.11)
RDW: 18 % — ABNORMAL HIGH (ref 11.5–15.5)
WBC: 8 10*3/uL (ref 4.0–10.5)
nRBC: 0 % (ref 0.0–0.2)

## 2022-12-26 LAB — PROTIME-INR
INR: 1.7 — ABNORMAL HIGH (ref 0.8–1.2)
Prothrombin Time: 19.8 seconds — ABNORMAL HIGH (ref 11.4–15.2)

## 2022-12-26 LAB — LIPASE, BLOOD: Lipase: 53 U/L — ABNORMAL HIGH (ref 11–51)

## 2022-12-26 MED ORDER — OXYCODONE HCL 5 MG PO TABS: 5.0000 mg | ORAL_TABLET | Freq: Once | ORAL | Status: DC

## 2022-12-26 NOTE — ED Triage Notes (Signed)
Pt with c/o abdominal pain. States she has cirrhosis and has "too much fluid".

## 2022-12-26 NOTE — ED Notes (Signed)
Approx 3800 ml of fluid drained from pts abd by Bero MD.

## 2022-12-26 NOTE — Discharge Instructions (Signed)
You were evaluated in the Emergency Department and after careful evaluation, we did not find any emergent condition requiring admission or further testing in the hospital.  Your exam/testing today is overall reassuring.  Recommend close follow-up with your liver doctors.  Please return to the Emergency Department if you experience any worsening of your condition such as abdominal pain or fever.   Thank you for allowing [place] to be a part of your care.

## 2022-12-26 NOTE — ED Provider Notes (Signed)
[facility] Emergency Department Provider Note MRN:  [id number]  Arrival date & time: 12/26/22     Chief Complaint   Abdominal Pain   History of Present Illness   Karla Young is a 62 y.o. year-old female with a history of cirrhosis presenting to the ED with chief complaint of abdominal pain.  Patient feels like she is full of fluid, feels very tight.  She undergoes paracentesis regularly, scheduled for one Monday.  Felt to tight in the abdomen to wait that long, here for evaluation.  Denies fever, no other complaints.  Review of Systems  A thorough review of systems was obtained and all systems are negative except as noted in the HPI and PMH.   Patient's Health History    Past Medical History:  Diagnosis Date   Anxiety    Cirrhosis (HCC)    COPD (chronic obstructive pulmonary disease) (HCC)    Fibromyalgia    GERD (gastroesophageal reflux disease)    Heart murmur    Hepatitis C    Hypertension    Hypotension    Hypothyroid    Migraines    MRSA (methicillin resistant staph aureus) culture positive     Past Surgical History:  Procedure Laterality Date   ABDOMINAL HYSTERECTOMY     partial   BACK SURGERY     lumbar removal of disc   BIOPSY  09/06/2014   Procedure: BIOPSY;  Surgeon: Malissa Hippo, MD;  Location: [facility] ORS;  Service: Endoscopy;;   CHOLECYSTECTOMY     COLONOSCOPY WITH PROPOFOL N/A 09/06/2014   Procedure: COLONOSCOPY WITH PROPOFOL (Procedure #2) At cecum at 0819; total withdrawal time=13 minutes;  Surgeon: Malissa Hippo, MD;  Location: [facility] ORS;  Service: Endoscopy;  Laterality: N/A;   ESOPHAGEAL DILATION N/A 09/06/2014   Procedure: ESOPHAGEAL DILATION WITH MALONEY DILATOR ;  Surgeon: Malissa Hippo, MD;  Location: [facility] ORS;  Service: Endoscopy;  Laterality: N/A;   ESOPHAGOGASTRODUODENOSCOPY (EGD) WITH PROPOFOL N/A 09/06/2014   Procedure: ESOPHAGOGASTRODUODENOSCOPY (EGD) WITH PROPOFOL (Procedure #1) ;  Surgeon: Malissa Hippo, MD;   Location: [facility] ORS;  Service: Endoscopy;  Laterality: N/A;   ESOPHAGOGASTRODUODENOSCOPY (EGD) WITH PROPOFOL N/A 09/02/2021   Procedure: ESOPHAGOGASTRODUODENOSCOPY (EGD) WITH PROPOFOL;  Surgeon: Dolores Frame, MD;  Location: [facility] ENDO SUITE;  Service: Gastroenterology;  Laterality: N/A;  830   ESOPHAGOGASTRODUODENOSCOPY (EGD) WITH PROPOFOL N/A 09/02/2022   Procedure: ESOPHAGOGASTRODUODENOSCOPY (EGD) WITH PROPOFOL;  Surgeon: Dolores Frame, MD;  Location: [facility] END[facility] SUITE;  Service: Gastroenterology;  Laterality: N/A;  1030am, asa 3   HEMORRHOID SURGERY     Dr Vicenta Aly   IR RADIOLOGIST EVAL & MGMT  08/26/2022   IR RADIOLOGIST EVAL & MGMT  09/22/2022   PARTIAL HYSTERECTOMY      One ovary left   RECTAL PROLAPSE REPAIR     WRIST SURGERY  1997    Family History  Problem Relation Age of Onset   Colon cancer Neg Hx    Colon polyps Neg Hx     Social History   Socioeconomic History   Marital status: Widowed    Spouse name: Not on file   Number of children: Not on file   Years of education: Not on file   Highest education level: Not on file  Occupational History   Not on file  Tobacco Use   Smoking status: Former    Packs/day: 0.25    Years: 30.00    Additional pack years: 0.00    Total pack years: 7.50  Types: Cigarettes    Passive exposure: Past   Smokeless tobacco: Never   Tobacco comments:    3-4 cigs a day since age 63.  Vaping Use   Vaping Use: Never used  Substance and Sexual Activity   Alcohol use: Not Currently    Comment: used to beer nightly not sure of how many   Drug use: No   Sexual activity: Yes    Birth control/protection: Surgical  Other Topics Concern   Not on file  Social History Narrative   Not on file   Social Determinants of Health   Financial Resource Strain: Not on file  Food Insecurity: Not on file  Transportation Needs: Not on file  Physical Activity: Not on file  Stress: Not on file  Social Connections: Not on file   Intimate Partner Violence: Not on file     Physical Exam   Vitals:   12/26/22 0003 12/26/22 0128  BP: 112/71 (!) 103/58  Pulse: 71 68  Resp: 20 16  Temp: 98.1 F (36.7 C)   SpO2: 100% 100%    CONSTITUTIONAL: Chronically ill-appearing, NAD NEURO/PSYCH:  Alert and oriented x 3, no focal deficits EYES:  eyes equal and reactive ENT/NECK:  no LAD, no JVD CARDIO: Regular rate, well-perfused, normal S1 and S2 PULM:  CTAB no wheezing or rhonchi GI/GU: Protuberant distended abdomen with fluid wave, largely nontender MSK/SPINE:  No gross deformities, no edema SKIN:  no rash, atraumatic   *Additional and/or pertinent findings included in MDM below  Diagnostic and Interventional Summary    EKG Interpretation  Date/Time:    Ventricular Rate:    PR Interval:    QRS Duration:   QT Interval:    QTC Calculation:   R Axis:     Text Interpretation:         Labs Reviewed  LIPASE, BLOOD - Abnormal; Notable for the following components:      Result Value   Lipase 53 (*)    All other components within normal limits  COMPREHENSIVE METABOLIC PANEL - Abnormal; Notable for the following components:   Sodium 132 (*)    Albumin 3.0 (*)    AST 55 (*)    Total Bilirubin 4.5 (*)    All other components within normal limits  CBC - Abnormal; Notable for the following components:   RBC 3.81 (*)    Hemoglobin 11.4 (*)    HCT 33.7 (*)    RDW 18.0 (*)    Platelets 90 (*)    All other components within normal limits  URINALYSIS, ROUTINE W REFLEX MICROSCOPIC - Abnormal; Notable for the following components:   Bilirubin Urine SMALL (*)    All other components within normal limits  PROTIME-INR - Abnormal; Notable for the following components:   Prothrombin Time 19.8 (*)    INR 1.7 (*)    All other components within normal limits    No orders to display    Medications - No data to display   Procedures  /  Critical Care .Paracentesis  Date/Time: 12/26/2022 1:41 AM  Performed by:  Sabas Sous, MD Authorized by: Sabas Sous, MD   Consent:    Consent obtained:  Verbal   Consent given by:  Patient   Risks, benefits, and alternatives were discussed: yes     Risks discussed:  Bleeding, bowel perforation, infection and pain   Alternatives discussed:  Delayed treatment Universal protocol:    Procedure explained and questions answered to patient or proxy's satisfaction:  yes     Imaging studies available: yes     Site/side marked: yes     Immediately prior to procedure, a time out was called: yes     Patient identity confirmed:  Verbally with patient Pre-procedure details:    Procedure purpose:  Therapeutic   Preparation: Patient was prepped and draped in usual sterile fashion   Anesthesia:    Anesthesia method:  Local infiltration   Local anesthetic:  Lidocaine 1% w/o epi Procedure details:    Needle gauge:  18   Ultrasound guidance: yes     Puncture site:  R lower quadrant   Fluid removed amount:  3800cc   Fluid appearance:  Yellow   Dressing: Steri-Strip and Dermabond. Post-procedure details:    Procedure completion:  Tolerated well, no immediate complications   ED Course and Medical Decision Making  Initial Impression and Ddx Suspect symptomatic abdominal ascites, low concern for SBP.  Will consider therapeutic paracentesis.  Will also evaluate liver function testing.  Past medical/surgical history that increases complexity of ED encounter: Cirrhosis  Interpretation of Diagnostics I personally reviewed the laboratory assessment and my interpretation is as follows: No significant blood count or electrolyte disturbance with regard to patient's baseline levels    Patient Reassessment and Ultimate Disposition/Management     See procedural details above, patient feeling so much better after removal of the fluid.  No signs of decompensated cirrhosis with regard to the laboratory evaluation, no leukocytosis, no fever and so highly doubt SBP.   Appropriate for discharge.  Patient management required discussion with the following services or consulting groups:  None  Complexity of Problems Addressed Acute illness or injury that poses threat of life of bodily function  Additional Data Reviewed and Analyzed Further history obtained from: Prior labs/imaging results  Additional Factors Impacting ED Encounter Risk Major procedures  Elmer Sow. Pilar Plate, MD [facility] Emergency Medicine [facility] [email or web address]  Final Clinical Impressions(s) / ED Diagnoses     ICD-10-CM   1. Other ascites  R18.8       ED Discharge Orders     None        Discharge Instructions Discussed with and Provided to Patient:     Discharge Instructions      You were evaluated in the Emergency Department and after careful evaluation, we did not find any emergent condition requiring admission or further testing in the hospital.  Your exam/testing today is overall reassuring.  Recommend close follow-up with your liver doctors.  Please return to the Emergency Department if you experience any worsening of your condition such as abdominal pain or fever.   Thank you for allowing [place] to be a part of your care.       Sabas Sous, MD 12/26/22 [phone]

## 2022-12-27 ENCOUNTER — Telehealth (INDEPENDENT_AMBULATORY_CARE_PROVIDER_SITE_OTHER): Payer: Self-pay | Admitting: Gastroenterology

## 2022-12-27 ENCOUNTER — Ambulatory Visit (HOSPITAL_COMMUNITY): Admission: RE | Admit: 2022-12-27 | Payer: 59 | Source: Ambulatory Visit

## 2022-12-27 NOTE — Telephone Encounter (Signed)
Pt called in and needed to verify time of procedure on 12/30/22. Advised pt we have her down for 2:15 pm but she will be told exact arrival time at pre op appt. Advised her that her pre op appt is tomorrow via phone at 11:30am

## 2022-12-27 NOTE — Telephone Encounter (Signed)
Pt contacted office and states she is needing to cancel US Paracentesis for this morning at 11. Pt states she had to go to ER on Saturday and not feeling well. Contacted Central Scheduling and pt has been cancelled.

## 2022-12-28 ENCOUNTER — Other Ambulatory Visit: Payer: Self-pay

## 2022-12-28 ENCOUNTER — Encounter (HOSPITAL_COMMUNITY)
Admission: RE | Admit: 2022-12-28 | Discharge: 2022-12-28 | Disposition: A | Discharge status: Home / Self Care | Payer: 59 | Source: Ambulatory Visit | Attending: Gastroenterology | Admitting: Gastroenterology

## 2022-12-28 ENCOUNTER — Encounter (HOSPITAL_COMMUNITY): Payer: Self-pay

## 2022-12-29 ENCOUNTER — Ambulatory Visit (HOSPITAL_COMMUNITY): Payer: 59

## 2022-12-30 ENCOUNTER — Encounter (INDEPENDENT_AMBULATORY_CARE_PROVIDER_SITE_OTHER): Payer: Self-pay | Admitting: *Deleted

## 2022-12-30 ENCOUNTER — Ambulatory Visit (HOSPITAL_BASED_OUTPATIENT_CLINIC_OR_DEPARTMENT_OTHER): Payer: 59 | Admitting: Anesthesiology

## 2022-12-30 ENCOUNTER — Encounter (HOSPITAL_COMMUNITY)
Admission: RE | Disposition: A | Discharge status: Home / Self Care | Payer: Self-pay | Source: Ambulatory Visit | Attending: Gastroenterology

## 2022-12-30 ENCOUNTER — Encounter (HOSPITAL_COMMUNITY): Payer: Self-pay | Admitting: Gastroenterology

## 2022-12-30 ENCOUNTER — Ambulatory Visit (HOSPITAL_COMMUNITY)
Admission: RE | Admit: 2022-12-30 | Discharge: 2022-12-30 | Disposition: A | Discharge status: Home / Self Care | Payer: 59 | Source: Ambulatory Visit | Attending: Gastroenterology | Admitting: Gastroenterology

## 2022-12-30 ENCOUNTER — Other Ambulatory Visit: Payer: Self-pay

## 2022-12-30 ENCOUNTER — Ambulatory Visit (HOSPITAL_COMMUNITY): Payer: 59 | Admitting: Anesthesiology

## 2022-12-30 DIAGNOSIS — J449 Chronic obstructive pulmonary disease, unspecified: Secondary | ICD-10-CM | POA: Insufficient documentation

## 2022-12-30 DIAGNOSIS — R188 Other ascites: Secondary | ICD-10-CM | POA: Insufficient documentation

## 2022-12-30 DIAGNOSIS — Z1211 Encounter for screening for malignant neoplasm of colon: Secondary | ICD-10-CM | POA: Diagnosis not present

## 2022-12-30 DIAGNOSIS — I8500 Esophageal varices without bleeding: Secondary | ICD-10-CM | POA: Diagnosis not present

## 2022-12-30 DIAGNOSIS — D123 Benign neoplasm of transverse colon: Secondary | ICD-10-CM | POA: Diagnosis not present

## 2022-12-30 DIAGNOSIS — D124 Benign neoplasm of descending colon: Secondary | ICD-10-CM | POA: Diagnosis not present

## 2022-12-30 DIAGNOSIS — D126 Benign neoplasm of colon, unspecified: Secondary | ICD-10-CM | POA: Diagnosis not present

## 2022-12-30 DIAGNOSIS — E039 Hypothyroidism, unspecified: Secondary | ICD-10-CM | POA: Diagnosis not present

## 2022-12-30 DIAGNOSIS — Z8619 Personal history of other infectious and parasitic diseases: Secondary | ICD-10-CM | POA: Insufficient documentation

## 2022-12-30 DIAGNOSIS — F419 Anxiety disorder, unspecified: Secondary | ICD-10-CM

## 2022-12-30 DIAGNOSIS — K7460 Unspecified cirrhosis of liver: Secondary | ICD-10-CM | POA: Insufficient documentation

## 2022-12-30 DIAGNOSIS — K589 Irritable bowel syndrome without diarrhea: Secondary | ICD-10-CM | POA: Insufficient documentation

## 2022-12-30 DIAGNOSIS — K219 Gastro-esophageal reflux disease without esophagitis: Secondary | ICD-10-CM | POA: Diagnosis not present

## 2022-12-30 DIAGNOSIS — Z87891 Personal history of nicotine dependence: Secondary | ICD-10-CM | POA: Diagnosis not present

## 2022-12-30 DIAGNOSIS — Z09 Encounter for follow-up examination after completed treatment for conditions other than malignant neoplasm: Secondary | ICD-10-CM | POA: Diagnosis not present

## 2022-12-30 DIAGNOSIS — D122 Benign neoplasm of ascending colon: Secondary | ICD-10-CM | POA: Insufficient documentation

## 2022-12-30 DIAGNOSIS — Z79899 Other long term (current) drug therapy: Secondary | ICD-10-CM | POA: Insufficient documentation

## 2022-12-30 DIAGNOSIS — I10 Essential (primary) hypertension: Secondary | ICD-10-CM | POA: Insufficient documentation

## 2022-12-30 DIAGNOSIS — M797 Fibromyalgia: Secondary | ICD-10-CM | POA: Diagnosis not present

## 2022-12-30 DIAGNOSIS — D128 Benign neoplasm of rectum: Secondary | ICD-10-CM | POA: Diagnosis not present

## 2022-12-30 DIAGNOSIS — K635 Polyp of colon: Secondary | ICD-10-CM

## 2022-12-30 DIAGNOSIS — K621 Rectal polyp: Secondary | ICD-10-CM | POA: Diagnosis not present

## 2022-12-30 HISTORY — PX: HEMOSTASIS CLIP PLACEMENT: SHX6857

## 2022-12-30 HISTORY — PX: COLONOSCOPY WITH PROPOFOL: SHX5780

## 2022-12-30 HISTORY — PX: POLYPECTOMY: SHX5525

## 2022-12-30 LAB — HM COLONOSCOPY

## 2022-12-30 SURGERY — COLONOSCOPY WITH PROPOFOL
Anesthesia: General

## 2022-12-30 MED ORDER — PHENYLEPHRINE 80 MCG/ML (10ML) SYRINGE FOR IV PUSH (FOR BLOOD PRESSURE SUPPORT)
PREFILLED_SYRINGE | INTRAVENOUS | Status: AC
  Filled 2022-12-30: qty 10

## 2022-12-30 MED ORDER — EPHEDRINE 5 MG/ML INJ
INTRAVENOUS | Status: AC
  Filled 2022-12-30: qty 5

## 2022-12-30 MED ORDER — PROPOFOL 500 MG/50ML IV EMUL
INTRAVENOUS | Status: DC | PRN
  Administered 2022-12-30: 150 ug/kg/min via INTRAVENOUS

## 2022-12-30 MED ORDER — PHENYLEPHRINE 80 MCG/ML (10ML) SYRINGE FOR IV PUSH (FOR BLOOD PRESSURE SUPPORT)
PREFILLED_SYRINGE | INTRAVENOUS | Status: DC | PRN
  Administered 2022-12-30 (×8): 160 ug via INTRAVENOUS

## 2022-12-30 MED ORDER — LIDOCAINE HCL (CARDIAC) PF 100 MG/5ML IV SOSY
PREFILLED_SYRINGE | INTRAVENOUS | Status: DC | PRN
  Administered 2022-12-30: 50 mg via INTRAVENOUS

## 2022-12-30 MED ORDER — EPHEDRINE SULFATE-NACL 50-0.9 MG/10ML-% IV SOSY
PREFILLED_SYRINGE | INTRAVENOUS | Status: DC | PRN
  Administered 2022-12-30: 5 mg via INTRAVENOUS
  Administered 2022-12-30 (×2): 10 mg via INTRAVENOUS

## 2022-12-30 MED ORDER — PROPOFOL 10 MG/ML IV BOLUS
INTRAVENOUS | Status: DC | PRN
  Administered 2022-12-30 (×2): 40 mg via INTRAVENOUS
  Administered 2022-12-30: 100 mg via INTRAVENOUS

## 2022-12-30 MED ORDER — LACTATED RINGERS IV SOLN: INTRAVENOUS | Status: DC

## 2022-12-30 MED ORDER — DEXMEDETOMIDINE HCL IN NACL 80 MCG/20ML IV SOLN
INTRAVENOUS | Status: DC | PRN
  Administered 2022-12-30: 8 ug via INTRAVENOUS
  Administered 2022-12-30: 12 ug via INTRAVENOUS

## 2022-12-30 NOTE — Op Note (Signed)
[facility] Patient Name: Karla Young Procedure Date: 12/30/2022 1:24 PM MRN: [id number] Date of Birth: 08-24-1960 Attending MD: [name] , , [id number] CSN: [id number] Age: 62 Admit Type: Outpatient Procedure:                Colonoscopy Indications:              Screening for colorectal malignant neoplasm Providers:                [name], Jannett Celestine, RN, Lennice Sites                            Technician, Technician Referring MD:              Medicines:                Monitored Anesthesia Care Complications:            No immediate complications. Estimated Blood Loss:     Estimated blood loss: none. Procedure:                Pre-Anesthesia Assessment:                           - Prior to the procedure, a History and Physical                            was performed, and patient medications, allergies                            and sensitivities were reviewed. The patient's                            tolerance of previous anesthesia was reviewed.                           - The risks and benefits of the procedure and the                            sedation options and risks were discussed with the                            patient. All questions were answered and informed                            consent was obtained.                           - After reviewing the risks and benefits, the                            patient was deemed in satisfactory condition to                            undergo the procedure.                           - ASA Grade Assessment: III - A patient with  severe                            systemic disease.                           After obtaining informed consent, the colonoscope                            was passed under direct vision. Throughout the                            procedure, the patient's blood pressure, pulse, and                            oxygen saturations were monitored continuously. The                             [id number]-[id number] ([id number]) scope was introduced through                            the anus and advanced to the the cecum, identified                            by appendiceal orifice and ileocecal valve. The                            colonoscopy was performed without difficulty. The                            patient tolerated the procedure well. The quality                            of the bowel preparation was good. Scope In: 1:34:42 PM Scope Out: 2:06:57 PM Scope Withdrawal Time: 0 hours 25 minutes 43 seconds  Total Procedure Duration: 0 hours 32 minutes 15 seconds  Findings:      The perianal and digital rectal examinations were normal.      A 2 mm polyp was found in the ascending colon. The polyp was sessile.       The polyp was removed with a cold biopsy forceps. Resection and       retrieval were complete.      A 14 mm polyp was found in the descending colon. The polyp was       multi-lobulated. The polyp was removed with a hot snare. Resection and       retrieval were complete. To prevent bleeding after the polypectomy, two       hemostatic clips were successfully placed. Clip manufacturer: [employer]. There was no bleeding at the end of the procedure.      A 4 mm polyp was found in the rectum. The polyp was sessile. The polyp       was removed with a cold snare. Resection and retrieval were complete.      The retroflexed view of the distal rectum and anal verge was normal and       showed no anal  or rectal abnormalities. Impression:               - One 2 mm polyp in the ascending colon, removed                            with a cold biopsy forceps. Resected and retrieved.                           - One 14 mm polyp in the descending colon, removed                            with a hot snare. Resected and retrieved. Clips                            were placed. Clip manufacturer: [employer].                           - One 4 mm polyp in the rectum, removed  with a cold                            snare. Resected and retrieved.                           - The distal rectum and anal verge are normal on                            retroflexion view. Moderate Sedation:      Per Anesthesia Care Recommendation:           - Discharge patient to home (ambulatory).                           - Resume previous diet.                           - Await pathology results.                           - Repeat colonoscopy for surveillance based on                            pathology results. Procedure Code(s):        --- Professional ---                           [phone], RT, Colonoscopy, flexible; with removal of                            tumor(s), polyp(s), or other lesion(s) by snare                            technique                           45380, 59, Colonoscopy, flexible; with biopsy,  single or multiple Diagnosis Code(s):        --- Professional ---                           Z12.11, Encounter for screening for malignant                            neoplasm of colon                           D12.2, Benign neoplasm of ascending colon                           D12.4, Benign neoplasm of descending colon                           D12.8, Benign neoplasm of rectum CPT copyright 2022 [employer]. All rights reserved. The codes documented in this report are preliminary and upon coder review may  be revised to meet current compliance requirements. [name], MD [name],  12/30/2022 2:19:28 PM This report has been signed electronically. Number of Addenda: 0

## 2022-12-30 NOTE — Discharge Instructions (Signed)
You are being discharged to home.  Resume your previous diet.  We are waiting for your pathology results.  Your physician has recommended a repeat colonoscopy for surveillance based on pathology results.  

## 2022-12-30 NOTE — Anesthesia Preprocedure Evaluation (Addendum)
Anesthesia Evaluation  Patient identified by MRN, date of birth, ID band Patient awake    Reviewed: Allergy & Precautions, H&P , NPO status , Patient's Chart, lab work & pertinent test results  History of Anesthesia Complications (+) PONV and history of anesthetic complications  Airway Mallampati: II  TM Distance: >3 FB Neck ROM: Full    Dental  (+) Dental Advisory Given, Upper Dentures, Missing   Pulmonary COPD,  COPD inhaler, former smoker   Pulmonary exam normal breath sounds clear to auscultation       Cardiovascular Exercise Tolerance: Good hypertension, Pt. on medications + Valvular Problems/Murmurs  Rhythm:Regular Rate:Normal + Systolic murmurs  1. There is mild SAM of the anterior MV leaflet in setting of very  hyperdynamic LV function in absence of significant hypertrophy. Visually  there is a mild dynamic gradient, from available Doppler not able to  calculate exact pressures. . Left  ventricular ejection fraction, by estimation, is >75%. The left ventricle  has hyperdynamic function. The left ventricle has no regional wall motion  abnormalities. Left ventricular diastolic parameters were normal. The  average left ventricular global  longitudinal strain is -22.5 %. The global longitudinal strain is normal.   2. Right ventricular systolic function is normal. The right ventricular  size is normal. There is severely elevated pulmonary artery systolic  pressure.   3. Left atrial size was moderately dilated.   4. The mitral valve is abnormal. Mild mitral valve regurgitation. No  evidence of mitral stenosis.   5. Mild mean gradient of 16 mmHg across the aortic valve. Valve opens  well, appears gradient primarily due to the hyperdynamic LV and mild SAM  creating a mild dynamic subvalvular gradient as opposed to valvular  disease. . The aortic valve is tricuspid.  Aortic valve regurgitation is not visualized. No aortic  stenosis is  present.   6. The inferior vena cava is normal in size with greater than 50%  respiratory variability, suggesting right atrial pressure of 3 mmHg.     Neuro/Psych  Headaches PSYCHIATRIC DISORDERS Anxiety      Neuromuscular disease    GI/Hepatic ,GERD  Medicated and Controlled,,(+) Cirrhosis   ascites  substance abuse  alcohol use, Hepatitis -, CParacentesis - 12/24/22, took out 4 liters of fluid     Endo/Other  Hypothyroidism    Renal/GU Renal InsufficiencyRenal disease  negative genitourinary   Musculoskeletal  (+)  Fibromyalgia -  Abdominal   Peds negative pediatric ROS (+)  Hematology negative hematology ROS (+)   Anesthesia Other Findings   Reproductive/Obstetrics negative OB ROS                             Anesthesia Physical Anesthesia Plan  ASA: 4  Anesthesia Plan: General   Post-op Pain Management: Minimal or no pain anticipated   Induction: Intravenous  PONV Risk Score and Plan: 1 and Propofol infusion  Airway Management Planned: Nasal Cannula and Natural Airway  Additional Equipment:   Intra-op Plan:   Post-operative Plan:   Informed Consent: I have reviewed the patients History and Physical, chart, labs and discussed the procedure including the risks, benefits and alternatives for the proposed anesthesia with the patient or authorized representative who has indicated his/her understanding and acceptance.     Dental advisory given  Plan Discussed with: CRNA and Surgeon  Anesthesia Plan Comments:        Anesthesia Quick Evaluation

## 2022-12-30 NOTE — Transfer of Care (Signed)
Immediate Anesthesia Transfer of Care Note  Patient: Karla Young  Procedure(s) Performed: COLONOSCOPY WITH PROPOFOL POLYPECTOMY HEMOSTASIS CLIP PLACEMENT  Patient Location: Short Stay  Anesthesia Type:General  Level of Consciousness: awake  Airway & Oxygen Therapy: Patient Spontanous Breathing  Post-op Assessment: Report given to RN and Post -op Vital signs reviewed and stable  Post vital signs: Reviewed and stable  Last Vitals:  Vitals Value Taken Time  BP 77/39 12/30/22 1410  Temp    Pulse 70 12/30/22 1410  Resp 15 12/30/22 1410  SpO2 100 % 12/30/22 1410    Last Pain:  Vitals:   12/30/22 1410  TempSrc:   PainSc: 0-No pain      Patients Stated Pain Goal: 8 (12/30/22 1255)  Complications: No notable events documented.

## 2022-12-30 NOTE — Anesthesia Procedure Notes (Signed)
Date/Time: 12/30/2022 1:36 PM  Performed by: Julian Reil, CRNAPre-anesthesia Checklist: Patient identified, Emergency Drugs available, Suction available and Patient being monitored Patient Re-evaluated:Patient Re-evaluated prior to induction Oxygen Delivery Method: Nasal cannula Induction Type: IV induction Placement Confirmation: positive ETCO2

## 2022-12-30 NOTE — Anesthesia Postprocedure Evaluation (Signed)
Anesthesia Post Note  Patient: Karla Young  Procedure(s) Performed: COLONOSCOPY WITH PROPOFOL POLYPECTOMY HEMOSTASIS CLIP PLACEMENT  Patient location during evaluation: Phase II Anesthesia Type: General Level of consciousness: awake and alert and oriented Pain management: pain level controlled Vital Signs Assessment: post-procedure vital signs reviewed and stable Respiratory status: spontaneous breathing, nonlabored ventilation and respiratory function stable Cardiovascular status: blood pressure returned to baseline and stable Postop Assessment: no apparent nausea or vomiting Anesthetic complications: no  No notable events documented.   Last Vitals:  Vitals:   12/30/22 1410 12/30/22 1414  BP: (!) 77/39 (!) 96/53  Pulse: 70   Resp: 15   Temp:  36.4 C  SpO2: 100%     Last Pain:  Vitals:   12/30/22 1414  TempSrc: Oral  PainSc:                  [facility] [place] [facility] Keithen Capo

## 2022-12-30 NOTE — H&P (Signed)
Karla Young is an 62 y.o. female.   Chief Complaint: CRC screening HPI: Karla Young is a 62 y.o. 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  The patient denies having any complaints such as melena, hematochezia, abdominal pain or distention, change in her bowel movement consistency or frequency, no changes in weight recently.  No family history of colorectal cancer.   Past Medical History:  Diagnosis Date   Anxiety    Cirrhosis (HCC)    COPD (chronic obstructive pulmonary disease) (HCC)    Fibromyalgia    GERD (gastroesophageal reflux disease)    Heart murmur    Hepatitis C    Hypertension    Hypotension    Hypothyroid    Migraines    MRSA (methicillin resistant staph aureus) culture positive     Past Surgical History:  Procedure Laterality Date   ABDOMINAL HYSTERECTOMY     partial   BACK SURGERY     lumbar removal of disc   BIOPSY  09/06/2014   Procedure: BIOPSY;  Surgeon: Malissa Hippo, MD;  Location: [facility];  Service: Endoscopy;;   CHOLECYSTECTOMY     COLONOSCOPY WITH PROPOFOL N/A 09/06/2014   Procedure: COLONOSCOPY WITH PROPOFOL (Procedure #2) At cecum at 0819; total withdrawal time=13 minutes;  Surgeon: Malissa Hippo, MD;  Location: [facility];  Service: Endoscopy;  Laterality: N/A;   ESOPHAGEAL DILATION N/A 09/06/2014   Procedure: ESOPHAGEAL DILATION WITH MALONEY DILATOR ;  Surgeon: Malissa Hippo, MD;  Location: [facility];  Service: Endoscopy;  Laterality: N/A;   ESOPHAGOGASTRODUODENOSCOPY (EGD) WITH PROPOFOL N/A 09/06/2014   Procedure: ESOPHAGOGASTRODUODENOSCOPY (EGD) WITH PROPOFOL (Procedure #1) ;  Surgeon: Malissa Hippo, MD;  Location: [facility];   Service: Endoscopy;  Laterality: N/A;   ESOPHAGOGASTRODUODENOSCOPY (EGD) WITH PROPOFOL N/A 09/02/2021   Procedure: ESOPHAGOGASTRODUODENOSCOPY (EGD) WITH PROPOFOL;  Surgeon: Dolores Frame, MD;  Location: [facility];  Service: Gastroenterology;  Laterality: N/A;  830   ESOPHAGOGASTRODUODENOSCOPY (EGD) WITH PROPOFOL N/A 09/02/2022   Procedure: ESOPHAGOGASTRODUODENOSCOPY (EGD) WITH PROPOFOL;  Surgeon: Dolores Frame, MD;  Location: [facility];  Service: Gastroenterology;  Laterality: N/A;  1030am, asa 3   HEMORRHOID SURGERY     Dr Vicenta Aly   IR RADIOLOGIST EVAL & MGMT  08/26/2022   IR RADIOLOGIST EVAL & MGMT  09/22/2022   PARTIAL HYSTERECTOMY      One ovary left   RECTAL PROLAPSE REPAIR     WRIST SURGERY  1997    Family History  Problem Relation Age of Onset   Colon cancer Neg Hx    Colon polyps Neg Hx    Social History:  reports that she has quit smoking. Her smoking use included cigarettes. She has a 7.50 pack-year smoking history. She has been exposed to tobacco smoke. She has never used smokeless tobacco. She reports that she does not currently use alcohol. She reports that she does not use drugs.  Allergies: No Known Allergies  Medications Prior to Admission  Medication Sig Dispense Refill   albuterol (PROVENTIL) (2.5 MG/3ML) 0.083% nebulizer solution Take 3 mLs (2.5 mg total) by nebulization every 2 (two) hours as needed for wheezing. 75 mL 5   famotidine (PEPCID) 20 MG tablet Take 1 tablet (20 mg  total) by mouth at bedtime. 90 tablet 3   folic acid (FOLVITE) 1 MG tablet Take 1 tablet (1 mg total) by mouth daily. 30 tablet 3   furosemide (LASIX) 40 MG tablet Take 0.5 tablets (20 mg total) by mouth daily. (Patient taking differently: Take 20-40 mg by mouth daily.) 90 tablet 1   hydrOXYzine (ATARAX) 25 MG tablet Take 1 tablet (25 mg total) by mouth every 8 (eight) hours as needed for anxiety or nausea. 30 tablet 1   levothyroxine (SYNTHROID) 88 MCG  tablet Take 1 tablet (88 mcg total) by mouth daily before breakfast. 30 tablet 3   Multiple Vitamins-Minerals (MULTIVITAMIN WITH MINERALS) tablet Take 1 tablet by mouth daily. 120 tablet 2   ondansetron (ZOFRAN) 4 MG tablet Take 1 tablet (4 mg total) by mouth every 6 (six) hours as needed for nausea. 60 tablet 5   pantoprazole (PROTONIX) 40 MG tablet Take 1 tablet (40 mg total) by mouth 2 (two) times daily. 180 tablet 2   spironolactone (ALDACTONE) 100 MG tablet Take 0.5 tablets (50 mg total) by mouth daily. (Patient taking differently: Take 50-100 mg by mouth daily.) 90 tablet 1   thiamine (VITAMIN B-1) 100 MG tablet Take 1 tablet (100 mg total) by mouth daily. 100 tablet 2   albuterol (VENTOLIN HFA) 108 (90 Base) MCG/ACT inhaler Inhale 2 puffs into the lungs every 6 (six) hours as needed for wheezing or shortness of breath. 18 g 2   lactulose (CHRONULAC) 10 GM/15ML solution Take 15 mLs (10 g total) by mouth 3 (three) times daily. Goal is to have 2 to 3 Mushy/Loose stools per Day 1350 mL 1   linaclotide (LINZESS) 145 MCG CAPS capsule Take 145 mcg by mouth daily before breakfast.      No results found for this or any previous visit (from the past 48 hour(s)). No results found.  Review of Systems  All other systems reviewed and are negative.   Pulse (!) 53, temperature 98.1 F (36.7 C), temperature source Oral, resp. rate 16, height 5\' 3"  (1.6 m), weight 65.8 kg, SpO2 100 %. Physical Exam  GENERAL: The patient is AO x3, in no acute distress. HEENT: Head is normocephalic and atraumatic. EOMI are intact. Mouth is well hydrated and without lesions. NECK: Supple. No masses LUNGS: Clear to auscultation. No presence of rhonchi/wheezing/rales. Adequate chest expansion HEART: RRR, normal s1 and s2. ABDOMEN: Soft, nontender, no guarding, no peritoneal signs, and nondistended. BS +. No masses. EXTREMITIES: Without any cyanosis, clubbing, rash, lesions or edema. NEUROLOGIC: AOx3, no focal motor  deficit. SKIN: no jaundice, no rashes  Assessment/Plan  Karla Young is a 62 y.o. 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 The patient is at average risk for colorectal cancer.  We will proceed with colonoscopy today.   Dolores Frame, MD 12/30/2022, 1:31 PM

## 2022-12-31 LAB — SURGICAL PATHOLOGY

## 2023-01-01 ENCOUNTER — Emergency Department (HOSPITAL_COMMUNITY): Payer: 59

## 2023-01-01 ENCOUNTER — Other Ambulatory Visit: Payer: Self-pay

## 2023-01-01 ENCOUNTER — Encounter (HOSPITAL_COMMUNITY): Payer: Self-pay | Admitting: *Deleted

## 2023-01-01 ENCOUNTER — Emergency Department (HOSPITAL_COMMUNITY)
Admission: EM | Admit: 2023-01-01 | Discharge: 2023-01-01 | Disposition: A | Discharge status: Home / Self Care | Payer: 59 | Attending: Emergency Medicine | Admitting: Emergency Medicine

## 2023-01-01 DIAGNOSIS — Z79899 Other long term (current) drug therapy: Secondary | ICD-10-CM | POA: Diagnosis not present

## 2023-01-01 DIAGNOSIS — J449 Chronic obstructive pulmonary disease, unspecified: Secondary | ICD-10-CM | POA: Insufficient documentation

## 2023-01-01 DIAGNOSIS — I10 Essential (primary) hypertension: Secondary | ICD-10-CM | POA: Diagnosis not present

## 2023-01-01 DIAGNOSIS — R188 Other ascites: Secondary | ICD-10-CM | POA: Diagnosis not present

## 2023-01-01 DIAGNOSIS — R109 Unspecified abdominal pain: Secondary | ICD-10-CM | POA: Diagnosis not present

## 2023-01-01 DIAGNOSIS — R0602 Shortness of breath: Secondary | ICD-10-CM | POA: Diagnosis not present

## 2023-01-01 DIAGNOSIS — E039 Hypothyroidism, unspecified: Secondary | ICD-10-CM | POA: Insufficient documentation

## 2023-01-01 LAB — COMPREHENSIVE METABOLIC PANEL
ALT: 31 U/L (ref 0–44)
AST: 53 U/L — ABNORMAL HIGH (ref 15–41)
Albumin: 2.7 g/dL — ABNORMAL LOW (ref 3.5–5.0)
Alkaline Phosphatase: 78 U/L (ref 38–126)
Anion gap: 7 (ref 5–15)
BUN: 16 mg/dL (ref 8–23)
CO2: 24 mmol/L (ref 22–32)
Calcium: 8.9 mg/dL (ref 8.9–10.3)
Chloride: 100 mmol/L (ref 98–111)
Creatinine, Ser: 0.87 mg/dL (ref 0.44–1.00)
GFR, Estimated: >60 mL/min (ref >60)
Glucose, Bld: 78 mg/dL (ref 70–99)
Potassium: 3.9 mmol/L (ref 3.5–5.1)
Sodium: 131 mmol/L — ABNORMAL LOW (ref 135–145)
Total Bilirubin: 3.9 mg/dL — ABNORMAL HIGH (ref 0.3–1.2)
Total Protein: 6 g/dL — ABNORMAL LOW (ref 6.5–8.1)

## 2023-01-01 LAB — CBC WITH DIFFERENTIAL/PLATELET
Abs Immature Granulocytes: 0.02 10*3/uL (ref 0.00–0.07)
Basophils Absolute: 0.1 10*3/uL (ref 0.0–0.1)
Basophils Relative: 2 %
Eosinophils Absolute: 0.5 10*3/uL (ref 0.0–0.5)
Eosinophils Relative: 8 %
HCT: 32.1 % — ABNORMAL LOW (ref 36.0–46.0)
Hemoglobin: 10.9 g/dL — ABNORMAL LOW (ref 12.0–15.0)
Immature Granulocytes: 0 %
Lymphocytes Relative: 22 %
Lymphs Abs: 1.4 10*3/uL (ref 0.7–4.0)
MCH: 30.1 pg (ref 26.0–34.0)
MCHC: 34 g/dL (ref 30.0–36.0)
MCV: 88.7 fL (ref 80.0–100.0)
Monocytes Absolute: 0.8 10*3/uL (ref 0.1–1.0)
Monocytes Relative: 13 %
Neutro Abs: 3.6 10*3/uL (ref 1.7–7.7)
Neutrophils Relative %: 55 %
Platelets: 69 10*3/uL — ABNORMAL LOW (ref 150–400)
RBC: 3.62 MIL/uL — ABNORMAL LOW (ref 3.87–5.11)
RDW: 17.2 % — ABNORMAL HIGH (ref 11.5–15.5)
WBC: 6.4 10*3/uL (ref 4.0–10.5)
nRBC: 0 % (ref 0.0–0.2)

## 2023-01-01 LAB — PROTIME-INR
INR: 1.7 — ABNORMAL HIGH (ref 0.8–1.2)
Prothrombin Time: 20.1 seconds — ABNORMAL HIGH (ref 11.4–15.2)

## 2023-01-01 LAB — LIPASE, BLOOD: Lipase: 42 U/L (ref 11–51)

## 2023-01-01 NOTE — ED Provider Notes (Signed)
[place]  EMERGENCY DEPARTMENT AT [facility] Provider Note   CSN: [id number] Arrival date & time: 01/01/23  1041     History  Chief Complaint  Patient presents with   Abdominal Pain    Karla Young is a 62 y.o. female history of cirrhosis with ascites, hypothyroidism, hypertension, COPD, AKI, esophageal varices, alcohol use presented with ascites for the past 6 days.  Patient was seen in the emergency department 6 days ago for the same chief concern and had a paracentesis and was discharged.  Patient states since then her ascites is coming back and she is becoming short of breath which is on par for her in terms of her ascites.  Patient denies any fevers, nausea/vomiting, hematemesis, changes in bowel or urinary habits, chest pain, skin color changes.  Patient states she is currently on liver transplant list.   Home Medications Prior to Admission medications   Medication Sig Start Date End Date Taking? Authorizing Provider  albuterol (PROVENTIL) (2.5 MG/3ML) 0.083% nebulizer solution Take 3 mLs (2.5 mg total) by nebulization every 2 (two) hours as needed for wheezing. 07/23/22   Shon Hale, MD  albuterol (VENTOLIN HFA) 108 (90 Base) MCG/ACT inhaler Inhale 2 puffs into the lungs every 6 (six) hours as needed for wheezing or shortness of breath. 07/23/22   Shon Hale, MD  famotidine (PEPCID) 20 MG tablet Take 1 tablet (20 mg total) by mouth at bedtime. 09/13/22   Gelene Mink, NP  folic acid (FOLVITE) 1 MG tablet Take 1 tablet (1 mg total) by mouth daily. 07/24/22   Shon Hale, MD  furosemide (LASIX) 40 MG tablet Take 0.5 tablets (20 mg total) by mouth daily. Patient taking differently: Take 20-40 mg by mouth daily. 09/02/22   Dolores Frame, MD  hydrOXYzine (ATARAX) 25 MG tablet Take 1 tablet (25 mg total) by mouth every 8 (eight) hours as needed for anxiety or nausea. 07/23/22   Shon Hale, MD  lactulose (CHRONULAC) 10 GM/15ML solution Take 15  mLs (10 g total) by mouth 3 (three) times daily. Goal is to have 2 to 3 Mushy/Loose stools per Day 11/18/22   Tiffany Kocher, PA-C  levothyroxine (SYNTHROID) 88 MCG tablet Take 1 tablet (88 mcg total) by mouth daily before breakfast. 07/23/22   Shon Hale, MD  linaclotide (LINZESS) 145 MCG CAPS capsule Take 145 mcg by mouth daily before breakfast.    [provider]  Multiple Vitamins-Minerals (MULTIVITAMIN WITH MINERALS) tablet Take 1 tablet by mouth daily. 07/23/22 07/23/23  Shon Hale, MD  ondansetron (ZOFRAN) 4 MG tablet Take 1 tablet (4 mg total) by mouth every 6 (six) hours as needed for nausea. 08/30/22   Gelene Mink, NP  pantoprazole (PROTONIX) 40 MG tablet Take 1 tablet (40 mg total) by mouth 2 (two) times daily. 09/13/22   Gelene Mink, NP  spironolactone (ALDACTONE) 100 MG tablet Take 0.5 tablets (50 mg total) by mouth daily. Patient taking differently: Take 50-100 mg by mouth daily. 09/02/22   Dolores Frame, MD  thiamine (VITAMIN B-1) 100 MG tablet Take 1 tablet (100 mg total) by mouth daily. 07/24/22   Shon Hale, MD      Allergies    Patient has no known allergies.    Review of Systems   Review of Systems  Gastrointestinal:  Positive for abdominal pain.    Physical Exam Updated Vital Signs BP 102/70   Pulse 67   Temp 98.8 F (37.1 C) (Oral)   Resp 19  Wt 66.4 kg   SpO2 100%   BMI 25.92 kg/m  Physical Exam Constitutional:      General: She is not in acute distress.    Appearance: She is not toxic-appearing.  Cardiovascular:     Rate and Rhythm: Normal rate and regular rhythm.     Pulses: Normal pulses.     Heart sounds: Normal heart sounds.  Pulmonary:     Effort: Pulmonary effort is normal. No respiratory distress.     Breath sounds: Normal breath sounds.  Abdominal:     Palpations: Abdomen is soft. There is fluid wave.     Comments: Ascites noted but not tense No peritoneal signs  Musculoskeletal:        General:  Normal range of motion.     Right lower leg: No edema.     Left lower leg: No edema.  Skin:    General: Skin is warm and dry.     Capillary Refill: Capillary refill takes less than 2 seconds.     Comments: No overlying skin color changes  Neurological:     Mental Status: She is alert.     Comments: Sensation intact in all 4 limbs     ED Results / Procedures / Treatments   Labs (all labs ordered are listed, but only abnormal results are displayed) Labs Reviewed  CBC WITH DIFFERENTIAL/PLATELET - Abnormal; Notable for the following components:      Result Value   RBC 3.62 (*)    Hemoglobin 10.9 (*)    HCT 32.1 (*)    RDW 17.2 (*)    Platelets 69 (*)    All other components within normal limits  COMPREHENSIVE METABOLIC PANEL - Abnormal; Notable for the following components:   Sodium 131 (*)    Total Protein 6.0 (*)    Albumin 2.7 (*)    AST 53 (*)    Total Bilirubin 3.9 (*)    All other components within normal limits  PROTIME-INR - Abnormal; Notable for the following components:   Prothrombin Time 20.1 (*)    INR 1.7 (*)    All other components within normal limits  LIPASE, BLOOD  URINALYSIS, ROUTINE W REFLEX MICROSCOPIC    EKG None  Radiology [place] ASCITES (ABDOMEN LIMITED)  Result Date: 01/01/2023 CLINICAL DATA:  History of cirrhosis and ascites. The patient reports paracentesis EXAM: LIMITED ABDOMEN ULTRASOUND FOR ASCITES TECHNIQUE: Limited ultrasound survey for ascites was performed in all four abdominal quadrants. COMPARISON:  None Available. FINDINGS: Ascites is noted in all 4 quadrants of the abdomen. IMPRESSION: Ascites. Electronically Signed   By: Romona Curls M.D.   On: 01/01/2023 13:09   DG Chest 2 View  Result Date: 01/01/2023 CLINICAL DATA:  Shortness of breath, ascites EXAM: CHEST - 2 VIEW COMPARISON:  07/21/2022 FINDINGS: The heart size and mediastinal contours are within normal limits. Mild diffuse interstitial pulmonary opacity. The visualized skeletal  structures are unremarkable. IMPRESSION: Mild diffuse interstitial pulmonary opacity, consistent with edema or infection. No focal airspace opacity. Electronically Signed   By: Jearld Lesch M.D.   On: 01/01/2023 12:41    Procedures Procedures    Medications Ordered in ED Medications - No data to display  ED Course/ Medical Decision Making/ A&P Clinical Course as of 01/01/23 1513  Sat Jan 01, 2023  1510 She has a history of liver disease and ascites.  She recently had a paracentesis done in the ED.  She tells me she feels the fluid is accumulating.  Her  lab work is about baseline for her and she does not have tense ascites or respiratory distress.  No indications for emergent paracentesis at this time.  Recommended she reach out to her GI doctor to schedule paracentesis. [MB]    Clinical Course User Index [MB] Terrilee Files, MD                             Medical Decision Making Amount and/or Complexity of Data Reviewed Labs: ordered.   [name] 62 y.o. presented today for ascites. Working DDx that I considered at this time includes, but not limited to, ascites, SBP, cellulitis, pleural effusion, ruptured esophageal varices.  R/o DDx: SBP, cellulitis, pleural effusion, ruptured esophageal varices: These are considered less likely due to history of present illness and physical exam findings.  Review of prior external notes: 12/26/2022 ED  Unique Tests and My Interpretation:  CBC with differential: Unremarkable PT/INR: Similar to baseline CMP: Similar to baseline Lipase: Negative Chest x-ray: Mild diffuse interstitial opacity suggestive of edema or infection [place] ascites: ascites noted in all 4 quads  Discussion with Independent Historian: None  Discussion of Management of Tests: None  Risk: Low: based on diagnostic testing/clinical impression and treatment plan  Risk Stratification Score: None  Staffed with Charm Barges, MD   Plan: Patient presented for ascites. On  exam patient was in no acute distress and stable vitals.  Patient did have ascites noted with positive fluid wave however it did not endorse any fevers or infectious symptoms and did not have any guarding or peritoneal signs indicative of SBP.  Patient states she feels this way normally when she needs to have a paracentesis.  Labs and chest x-ray ordered.  Patient stable this time.  Patient's labs were at baseline for her.  Patient states that she normally does have a scheduled paracentesis in which she has standing orders through her GI specialist but now she is out of orders which is why she returned to the ED. Attending was notified and evaluated patient. Attending and I both agree patient does not need emergent paracentesis at this time and can follow up with her GI specialist to have a paracentesis scheduled.  Patient was educated to take her spironolactone and Lasix as prescribed.  Patient was given return precautions.patient stable for discharge at this time.  Patient verbalized understanding of plan.         Final Clinical Impression(s) / ED Diagnoses Final diagnoses:  Other ascites    Rx / DC Orders ED Discharge Orders     None         Remi Deter 01/01/23 1514    Terrilee Files, MD 01/01/23 [phone]

## 2023-01-01 NOTE — ED Notes (Signed)
Pt given blanket.

## 2023-01-01 NOTE — Discharge Instructions (Addendum)
Please follow-up with your GI doctor so that you may schedule a paracentesis.  Today your labs are reassuring and you did not show signs for an emergent paracentesis today.  Please continue to use your Lasix and spironolactone as prescribed.  If symptoms worsen please return to ER.

## 2023-01-01 NOTE — ED Triage Notes (Signed)
Pt with with swelling to abd and some pain.  Pt states she is having hard catching her breath as well.  Pt with cirrhosis of liver. Pt with last paracentesis last Saturday.

## 2023-01-04 ENCOUNTER — Ambulatory Visit (HOSPITAL_COMMUNITY)
Admission: RE | Admit: 2023-01-04 | Discharge: 2023-01-04 | Disposition: A | Discharge status: Home / Self Care | Payer: 59 | Source: Ambulatory Visit | Attending: Gastroenterology | Admitting: Gastroenterology

## 2023-01-04 ENCOUNTER — Telehealth (INDEPENDENT_AMBULATORY_CARE_PROVIDER_SITE_OTHER): Payer: Self-pay | Admitting: Gastroenterology

## 2023-01-04 ENCOUNTER — Encounter (HOSPITAL_COMMUNITY): Payer: Self-pay

## 2023-01-04 DIAGNOSIS — K7460 Unspecified cirrhosis of liver: Secondary | ICD-10-CM | POA: Diagnosis not present

## 2023-01-04 DIAGNOSIS — R188 Other ascites: Secondary | ICD-10-CM | POA: Insufficient documentation

## 2023-01-04 LAB — BODY FLUID CELL COUNT WITH DIFFERENTIAL
Eos, Fluid: 1 %
Lymphs, Fluid: 65 %
Monocyte-Macrophage-Serous Fluid: 20 % — ABNORMAL LOW (ref 50–90)
Neutrophil Count, Fluid: 14 % (ref 0–25)
Total Nucleated Cell Count, Fluid: 98 cu mm (ref 0–1000)

## 2023-01-04 LAB — GRAM STAIN: Gram Stain: NONE SEEN

## 2023-01-04 NOTE — Telephone Encounter (Signed)
Left detailed message.   

## 2023-01-04 NOTE — Telephone Encounter (Signed)
Pt has Para completed this morning. Pt is aware that we will go from there once para is resulted.  (See earlier message)

## 2023-01-04 NOTE — Telephone Encounter (Signed)
Last seen by [facility]. Forwarding to her as she has been involved with this earlier this morning.

## 2023-01-04 NOTE — Progress Notes (Signed)
Right sided paracentesis procedure tolerated well today and 4.8 L of yellow ascites removed with labs sent for processing. PT verbalized understanding of discharge instructions and ambulatory at departure with no acute distress noted.

## 2023-01-04 NOTE — Telephone Encounter (Signed)
Pt called and LMOVM regarding orders for paras to be placed for her . She went to ED 5/18 and 5/25 . She was advised to have orders put in by GI dr. Please advise

## 2023-01-04 NOTE — Procedures (Signed)
PROCEDURE SUMMARY:  Successful ultrasound guided paracentesis from the right  lower quadrant.  Yielded 4.8 of straw fluid.  No immediate complications.  The patient tolerated the procedure well.   Specimen was sent for labs.  EBL < 2mL  The patient has previously been evaluated by the [facility], and deemed not a candidate for intervention.  Due to high MELD

## 2023-01-04 NOTE — Telephone Encounter (Signed)
Pt left voicemail stating that she needed to have paracentesis ordered. Pt has appt this morning for Paracentesis. Please advise. Thank you.

## 2023-01-04 NOTE — Telephone Encounter (Signed)
noted 

## 2023-01-05 LAB — CULTURE, BODY FLUID W GRAM STAIN -BOTTLE

## 2023-01-06 ENCOUNTER — Other Ambulatory Visit: Payer: Self-pay | Admitting: *Deleted

## 2023-01-06 ENCOUNTER — Encounter (HOSPITAL_COMMUNITY): Payer: Self-pay | Admitting: Gastroenterology

## 2023-01-06 ENCOUNTER — Telehealth: Payer: Self-pay | Admitting: Gastroenterology

## 2023-01-06 DIAGNOSIS — K7031 Alcoholic cirrhosis of liver with ascites: Secondary | ICD-10-CM

## 2023-01-06 LAB — CULTURE, BODY FLUID W GRAM STAIN -BOTTLE

## 2023-01-06 NOTE — Telephone Encounter (Signed)
Noted  Forwarding to [employer]

## 2023-01-06 NOTE — Telephone Encounter (Signed)
I called patient to get her scheduled and she said she went Tuesday for a paracentsis and there were no orders she said.  She asked if you could send orders .Marland KitchenMarland Kitchen

## 2023-01-06 NOTE — Telephone Encounter (Signed)
Orders of para x 3 placed in epic.

## 2023-01-06 NOTE — Telephone Encounter (Signed)
Pt sent a message in here MyChart that advises of only 3 standing orders, none needed at this time, and only 5 liters to be drawn off.

## 2023-01-06 NOTE — Telephone Encounter (Signed)
Please arrange [place] para standing orders up to 3 times. She doesn't need one just yet. This is to have on file.    Max 5 liters removed, needs 25 grams IV albumin at start. Cell count and culture requested.

## 2023-01-09 LAB — CULTURE, BODY FLUID W GRAM STAIN -BOTTLE: Culture: NO GROWTH

## 2023-01-13 ENCOUNTER — Other Ambulatory Visit (HOSPITAL_COMMUNITY): Payer: 59

## 2023-01-17 ENCOUNTER — Other Ambulatory Visit (HOSPITAL_COMMUNITY): Payer: 59

## 2023-01-17 ENCOUNTER — Encounter (HOSPITAL_COMMUNITY): Payer: Self-pay

## 2023-01-17 ENCOUNTER — Ambulatory Visit (HOSPITAL_COMMUNITY)
Admission: RE | Admit: 2023-01-17 | Discharge: 2023-01-17 | Disposition: A | Discharge status: Home / Self Care | Payer: 59 | Source: Ambulatory Visit | Attending: Gastroenterology | Admitting: Gastroenterology

## 2023-01-17 DIAGNOSIS — K7031 Alcoholic cirrhosis of liver with ascites: Secondary | ICD-10-CM | POA: Diagnosis not present

## 2023-01-17 DIAGNOSIS — R188 Other ascites: Secondary | ICD-10-CM | POA: Diagnosis not present

## 2023-01-17 LAB — BODY FLUID CELL COUNT WITH DIFFERENTIAL
Eos, Fluid: 0 %
Lymphs, Fluid: 60 %
Monocyte-Macrophage-Serous Fluid: 32 % — ABNORMAL LOW (ref 50–90)
Neutrophil Count, Fluid: 8 % (ref 0–25)
Total Nucleated Cell Count, Fluid: 107 cu mm (ref 0–1000)

## 2023-01-17 LAB — GRAM STAIN

## 2023-01-17 MED ORDER — ALBUMIN HUMAN 25 % IV SOLN
INTRAVENOUS | Status: AC
  Filled 2023-01-17: qty 100

## 2023-01-17 MED ORDER — ALBUMIN HUMAN 25 % IV SOLN
0.0000 g | Freq: Once | INTRAVENOUS | Status: AC
  Administered 2023-01-17: 25 g via INTRAVENOUS
  Filled 2023-01-17: qty 400

## 2023-01-17 NOTE — Progress Notes (Signed)
Patient tolerated right sided paracentesis and 25G of IV albumin well today and 3.5 Liters of clear yellow ascites removed with labs collected/sent for processing. Patient verbalized understanding of discharge instructions and ambulatory at departure with no acute distress noted. Patient reminded to please take her medicines as prescribed.

## 2023-01-17 NOTE — Procedures (Signed)
INDICATION: Ascites EXAM: ULTRASOUND GUIDED RIGHT LOWER QUADRANT PARACENTESIS GRIP-IR: Category: Fluids  Subcategory: Paracentesis  Follow-Up: None  MEDICATIONS: None. COMPLICATIONS: None immediate. PROCEDURE: Informed written consent was obtained from the patient after a discussion of the risks, benefits and alternatives to treatment. A timeout was performed prior to the initiation of the procedure.  Initial ultrasound scanning demonstrates a large amount of ascites within the right lower abdominal quadrant. The right lower abdomen was prepped and draped in the usual sterile fashion. 1% lidocaine  was used for local anesthesia.   Following this, a Yueh catheter was introduced. An ultrasound image was saved for documentation purposes. The paracentesis was performed. The catheter was removed and a dressing was applied. The patient tolerated the procedure well without immediate post procedural complication.   FINDINGS: A total of approximately 3.5 L of straw-colored fluid was removed. Samples were sent to the laboratory as requested by the clinical team. IMPRESSION:  Successful ultrasound-guided paracentesis yielding 3.5 liters of peritoneal fluid.

## 2023-01-18 ENCOUNTER — Ambulatory Visit: Payer: 59 | Admitting: Gastroenterology

## 2023-01-19 LAB — PATHOLOGIST SMEAR REVIEW

## 2023-01-21 LAB — CULTURE, BODY FLUID W GRAM STAIN -BOTTLE: Special Requests: ADEQUATE

## 2023-01-22 LAB — CULTURE, BODY FLUID W GRAM STAIN -BOTTLE: Culture: NO GROWTH

## 2023-01-25 DIAGNOSIS — Z8619 Personal history of other infectious and parasitic diseases: Secondary | ICD-10-CM | POA: Diagnosis not present

## 2023-01-25 DIAGNOSIS — M6284 Sarcopenia: Secondary | ICD-10-CM | POA: Diagnosis not present

## 2023-01-25 DIAGNOSIS — E440 Moderate protein-calorie malnutrition: Secondary | ICD-10-CM | POA: Diagnosis not present

## 2023-01-25 DIAGNOSIS — K7031 Alcoholic cirrhosis of liver with ascites: Secondary | ICD-10-CM | POA: Diagnosis not present

## 2023-01-25 DIAGNOSIS — K7682 Hepatic encephalopathy: Secondary | ICD-10-CM | POA: Diagnosis not present

## 2023-01-25 DIAGNOSIS — Z01818 Encounter for other preprocedural examination: Secondary | ICD-10-CM | POA: Diagnosis not present

## 2023-01-25 DIAGNOSIS — I8500 Esophageal varices without bleeding: Secondary | ICD-10-CM | POA: Diagnosis not present

## 2023-01-26 ENCOUNTER — Encounter (HOSPITAL_COMMUNITY): Payer: Self-pay

## 2023-01-26 ENCOUNTER — Ambulatory Visit (HOSPITAL_COMMUNITY)
Admission: RE | Admit: 2023-01-26 | Discharge: 2023-01-26 | Disposition: A | Discharge status: Home / Self Care | Payer: 59 | Source: Ambulatory Visit | Attending: Gastroenterology | Admitting: Gastroenterology

## 2023-01-26 DIAGNOSIS — R188 Other ascites: Secondary | ICD-10-CM | POA: Diagnosis not present

## 2023-01-26 DIAGNOSIS — K7031 Alcoholic cirrhosis of liver with ascites: Secondary | ICD-10-CM | POA: Insufficient documentation

## 2023-01-26 DIAGNOSIS — K7460 Unspecified cirrhosis of liver: Secondary | ICD-10-CM | POA: Diagnosis not present

## 2023-01-26 LAB — BODY FLUID CELL COUNT WITH DIFFERENTIAL
Eos, Fluid: 0 %
Lymphs, Fluid: 69 %
Monocyte-Macrophage-Serous Fluid: 27 % — ABNORMAL LOW (ref 50–90)
Neutrophil Count, Fluid: 4 % (ref 0–25)
Total Nucleated Cell Count, Fluid: 132 cu mm (ref 0–1000)

## 2023-01-26 LAB — GRAM STAIN: Gram Stain: NONE SEEN

## 2023-01-26 MED ORDER — ALBUMIN HUMAN 25 % IV SOLN
0.0000 g | Freq: Once | INTRAVENOUS | Status: AC
  Administered 2023-01-26: 25 g via INTRAVENOUS

## 2023-01-26 MED ORDER — ALBUMIN HUMAN 25 % IV SOLN
INTRAVENOUS | Status: AC
  Filled 2023-01-26: qty 100

## 2023-01-26 NOTE — Progress Notes (Signed)
Patient tolerated right sided paracentesis and 25G of IV albumin well today and 3.2 Liters of yellow ascites with labs collected and sent for processing. Patient verbalized understanding of discharge instructions and ambulatory at departure with no acute distress noted.

## 2023-01-26 NOTE — Procedures (Signed)
PROCEDURE SUMMARY:  Successful ultrasound guided paracentesis from the right lower quadrant.  Yielded 3.2 L of hazy yellow fluid.  No immediate complications.  The patient tolerated the procedure well.   Specimen was sent for labs.  EBL < 5mL  The patient has previously been evaluated by the [facility], and deemed not a candidate for intervention.   [name], PA-C

## 2023-01-27 DIAGNOSIS — Z87891 Personal history of nicotine dependence: Secondary | ICD-10-CM | POA: Diagnosis not present

## 2023-01-27 DIAGNOSIS — Z01419 Encounter for gynecological examination (general) (routine) without abnormal findings: Secondary | ICD-10-CM | POA: Diagnosis not present

## 2023-01-27 DIAGNOSIS — Z713 Dietary counseling and surveillance: Secondary | ICD-10-CM | POA: Diagnosis not present

## 2023-01-27 DIAGNOSIS — J449 Chronic obstructive pulmonary disease, unspecified: Secondary | ICD-10-CM | POA: Diagnosis not present

## 2023-01-28 LAB — CULTURE, BODY FLUID W GRAM STAIN -BOTTLE: Culture: NO GROWTH

## 2023-01-31 DIAGNOSIS — K7031 Alcoholic cirrhosis of liver with ascites: Secondary | ICD-10-CM | POA: Diagnosis not present

## 2023-01-31 DIAGNOSIS — K7469 Other cirrhosis of liver: Secondary | ICD-10-CM | POA: Diagnosis not present

## 2023-01-31 DIAGNOSIS — Z13220 Encounter for screening for lipoid disorders: Secondary | ICD-10-CM | POA: Diagnosis not present

## 2023-01-31 DIAGNOSIS — F1020 Alcohol dependence, uncomplicated: Secondary | ICD-10-CM | POA: Diagnosis not present

## 2023-01-31 DIAGNOSIS — Z01818 Encounter for other preprocedural examination: Secondary | ICD-10-CM | POA: Diagnosis not present

## 2023-01-31 DIAGNOSIS — K7290 Hepatic failure, unspecified without coma: Secondary | ICD-10-CM | POA: Diagnosis not present

## 2023-01-31 DIAGNOSIS — R188 Other ascites: Secondary | ICD-10-CM | POA: Diagnosis not present

## 2023-01-31 DIAGNOSIS — K7460 Unspecified cirrhosis of liver: Secondary | ICD-10-CM | POA: Diagnosis not present

## 2023-01-31 DIAGNOSIS — I6523 Occlusion and stenosis of bilateral carotid arteries: Secondary | ICD-10-CM | POA: Diagnosis not present

## 2023-01-31 DIAGNOSIS — K7040 Alcoholic hepatic failure without coma: Secondary | ICD-10-CM | POA: Diagnosis not present

## 2023-01-31 DIAGNOSIS — I517 Cardiomegaly: Secondary | ICD-10-CM | POA: Diagnosis not present

## 2023-01-31 DIAGNOSIS — R161 Splenomegaly, not elsewhere classified: Secondary | ICD-10-CM | POA: Diagnosis not present

## 2023-01-31 DIAGNOSIS — K766 Portal hypertension: Secondary | ICD-10-CM | POA: Diagnosis not present

## 2023-01-31 LAB — CULTURE, BODY FLUID W GRAM STAIN -BOTTLE

## 2023-02-01 ENCOUNTER — Ambulatory Visit (HOSPITAL_COMMUNITY)
Admission: RE | Admit: 2023-02-01 | Discharge: 2023-02-01 | Disposition: A | Discharge status: Home / Self Care | Payer: 59 | Source: Ambulatory Visit | Attending: Gastroenterology | Admitting: Gastroenterology

## 2023-02-01 ENCOUNTER — Encounter (HOSPITAL_COMMUNITY): Payer: Self-pay

## 2023-02-01 DIAGNOSIS — K7460 Unspecified cirrhosis of liver: Secondary | ICD-10-CM | POA: Diagnosis not present

## 2023-02-01 DIAGNOSIS — R188 Other ascites: Secondary | ICD-10-CM | POA: Diagnosis not present

## 2023-02-01 DIAGNOSIS — K7031 Alcoholic cirrhosis of liver with ascites: Secondary | ICD-10-CM | POA: Diagnosis not present

## 2023-02-01 LAB — BODY FLUID CELL COUNT WITH DIFFERENTIAL
Eos, Fluid: 1 %
Lymphs, Fluid: 69 %
Monocyte-Macrophage-Serous Fluid: 13 % — ABNORMAL LOW (ref 50–90)
Neutrophil Count, Fluid: 17 % (ref 0–25)
Total Nucleated Cell Count, Fluid: 177 cu mm (ref 0–1000)

## 2023-02-01 LAB — GRAM STAIN

## 2023-02-01 MED ORDER — ALBUMIN HUMAN 25 % IV SOLN
INTRAVENOUS | Status: AC
  Filled 2023-02-01: qty 100

## 2023-02-01 NOTE — Progress Notes (Signed)
Patient tolerated right sided paracentesis well today and 2.7 Liters of clear yellow ascites removed with labs collected and sent for processing. Patient verbalized understanding of discharge instructions and ambulatory at departure with no acute distress noted.

## 2023-02-02 LAB — CULTURE, BODY FLUID W GRAM STAIN -BOTTLE

## 2023-02-03 ENCOUNTER — Telehealth (INDEPENDENT_AMBULATORY_CARE_PROVIDER_SITE_OTHER): Payer: Self-pay

## 2023-02-03 LAB — CULTURE, BODY FLUID W GRAM STAIN -BOTTLE

## 2023-02-03 NOTE — Telephone Encounter (Signed)
Patient having fluid leaking form Paracentesis site.

## 2023-02-03 NOTE — Telephone Encounter (Signed)
I spoke with Rich in IR and he says the patient must have picked the glue off the sight and the patient should roll to the opposite side take a 4X4 gauze press hard with her finger hold sight for 20-30 minutes and place tape over the sight. Per Rich they can not reseal the wound as it will trap in bacteria in and cause an infection. I called the patient and the daughter both and explained the above, also advised if she felt she needed to go to the Ed not to hesitate. Patient and daughter state understanding.

## 2023-02-03 NOTE — Telephone Encounter (Signed)
Thanks for the update, good to know

## 2023-02-04 LAB — CULTURE, BODY FLUID W GRAM STAIN -BOTTLE

## 2023-02-06 LAB — CULTURE, BODY FLUID W GRAM STAIN -BOTTLE: Culture: NO GROWTH

## 2023-02-08 ENCOUNTER — Telehealth (INDEPENDENT_AMBULATORY_CARE_PROVIDER_SITE_OTHER): Payer: Self-pay | Admitting: Gastroenterology

## 2023-02-08 ENCOUNTER — Encounter (INDEPENDENT_AMBULATORY_CARE_PROVIDER_SITE_OTHER): Payer: Self-pay

## 2023-02-08 ENCOUNTER — Other Ambulatory Visit (INDEPENDENT_AMBULATORY_CARE_PROVIDER_SITE_OTHER): Payer: Self-pay | Admitting: Gastroenterology

## 2023-02-08 DIAGNOSIS — K7031 Alcoholic cirrhosis of liver with ascites: Secondary | ICD-10-CM

## 2023-02-08 NOTE — Telephone Encounter (Signed)
Paracentesis ordered ( I ordered 2). Contacted [facility] and pt is scheduled for 02/11/23 at 11am arrive at 10:45 am. [facility] states they do not have their regular NP there today and tomorrow and an outside provider is coming in on Friday. Pt contacted and verbalized understanding.

## 2023-02-08 NOTE — Telephone Encounter (Signed)
I had ordered standing paras in the past, and they were continuing to be under my name. However, I didn't see her last (was seen by [facility] March 2024).   Patient was due for an office visit. It appears she is having weekly paras completed. Last para was 6/25 with 2.7 liters removed.   She can have a para, max 5 liters. Needs Albumin IV 25 g at start (regardless of amount drawn off). Cell count for fluid. No diuretics day of para.  What diuretic regimen is she on now? We need to ger her in for an office visit.

## 2023-02-08 NOTE — Telephone Encounter (Signed)
Pt calling in and needing Paracentesis ordered. Please advise. Thank you

## 2023-02-08 NOTE — Telephone Encounter (Signed)
Pt states she is currently taking 1/2 Lasix per day.

## 2023-02-11 ENCOUNTER — Other Ambulatory Visit: Payer: Self-pay

## 2023-02-11 ENCOUNTER — Inpatient Hospital Stay (HOSPITAL_COMMUNITY)
Admission: EM | Admit: 2023-02-11 | Discharge: 2023-02-22 | DRG: 441 | Disposition: A | Discharge status: Other Acute Inpatient Hospital | Payer: 59 | Attending: Family Medicine | Admitting: Family Medicine

## 2023-02-11 ENCOUNTER — Encounter (HOSPITAL_COMMUNITY): Payer: Self-pay | Admitting: Internal Medicine

## 2023-02-11 ENCOUNTER — Encounter (HOSPITAL_COMMUNITY): Payer: Self-pay

## 2023-02-11 ENCOUNTER — Ambulatory Visit (HOSPITAL_COMMUNITY): Admission: RE | Admit: 2023-02-11 | Payer: 59 | Source: Ambulatory Visit

## 2023-02-11 ENCOUNTER — Emergency Department (HOSPITAL_COMMUNITY): Payer: 59

## 2023-02-11 DIAGNOSIS — R400 Somnolence: Principal | ICD-10-CM

## 2023-02-11 DIAGNOSIS — F17210 Nicotine dependence, cigarettes, uncomplicated: Secondary | ICD-10-CM | POA: Diagnosis not present

## 2023-02-11 DIAGNOSIS — J449 Chronic obstructive pulmonary disease, unspecified: Secondary | ICD-10-CM | POA: Diagnosis not present

## 2023-02-11 DIAGNOSIS — I10 Essential (primary) hypertension: Secondary | ICD-10-CM | POA: Diagnosis not present

## 2023-02-11 DIAGNOSIS — Z79890 Hormone replacement therapy (postmenopausal): Secondary | ICD-10-CM | POA: Diagnosis not present

## 2023-02-11 DIAGNOSIS — R159 Full incontinence of feces: Secondary | ICD-10-CM | POA: Diagnosis not present

## 2023-02-11 DIAGNOSIS — M797 Fibromyalgia: Secondary | ICD-10-CM | POA: Diagnosis present

## 2023-02-11 DIAGNOSIS — R410 Disorientation, unspecified: Secondary | ICD-10-CM | POA: Diagnosis not present

## 2023-02-11 DIAGNOSIS — Z79899 Other long term (current) drug therapy: Secondary | ICD-10-CM | POA: Diagnosis not present

## 2023-02-11 DIAGNOSIS — G9341 Metabolic encephalopathy: Secondary | ICD-10-CM | POA: Diagnosis not present

## 2023-02-11 DIAGNOSIS — E8770 Fluid overload, unspecified: Secondary | ICD-10-CM | POA: Diagnosis present

## 2023-02-11 DIAGNOSIS — K5909 Other constipation: Secondary | ICD-10-CM | POA: Diagnosis not present

## 2023-02-11 DIAGNOSIS — Z23 Encounter for immunization: Secondary | ICD-10-CM | POA: Diagnosis not present

## 2023-02-11 DIAGNOSIS — E860 Dehydration: Secondary | ICD-10-CM | POA: Diagnosis not present

## 2023-02-11 DIAGNOSIS — K219 Gastro-esophageal reflux disease without esophagitis: Secondary | ICD-10-CM | POA: Diagnosis present

## 2023-02-11 DIAGNOSIS — D638 Anemia in other chronic diseases classified elsewhere: Secondary | ICD-10-CM | POA: Diagnosis not present

## 2023-02-11 DIAGNOSIS — E039 Hypothyroidism, unspecified: Secondary | ICD-10-CM

## 2023-02-11 DIAGNOSIS — Z90711 Acquired absence of uterus with remaining cervical stump: Secondary | ICD-10-CM

## 2023-02-11 DIAGNOSIS — N289 Disorder of kidney and ureter, unspecified: Secondary | ICD-10-CM | POA: Diagnosis not present

## 2023-02-11 DIAGNOSIS — K766 Portal hypertension: Secondary | ICD-10-CM | POA: Diagnosis present

## 2023-02-11 DIAGNOSIS — Z743 Need for continuous supervision: Secondary | ICD-10-CM | POA: Diagnosis not present

## 2023-02-11 DIAGNOSIS — K7682 Hepatic encephalopathy: Principal | ICD-10-CM | POA: Diagnosis present

## 2023-02-11 DIAGNOSIS — Z7682 Awaiting organ transplant status: Secondary | ICD-10-CM | POA: Diagnosis not present

## 2023-02-11 DIAGNOSIS — F29 Unspecified psychosis not due to a substance or known physiological condition: Secondary | ICD-10-CM | POA: Diagnosis not present

## 2023-02-11 DIAGNOSIS — D6959 Other secondary thrombocytopenia: Secondary | ICD-10-CM | POA: Diagnosis not present

## 2023-02-11 DIAGNOSIS — K7210 Chronic hepatic failure without coma: Secondary | ICD-10-CM | POA: Diagnosis present

## 2023-02-11 DIAGNOSIS — K7290 Hepatic failure, unspecified without coma: Secondary | ICD-10-CM | POA: Diagnosis not present

## 2023-02-11 DIAGNOSIS — K652 Spontaneous bacterial peritonitis: Secondary | ICD-10-CM

## 2023-02-11 DIAGNOSIS — E871 Hypo-osmolality and hyponatremia: Secondary | ICD-10-CM | POA: Diagnosis present

## 2023-02-11 DIAGNOSIS — R32 Unspecified urinary incontinence: Secondary | ICD-10-CM | POA: Diagnosis not present

## 2023-02-11 DIAGNOSIS — R4182 Altered mental status, unspecified: Secondary | ICD-10-CM | POA: Diagnosis not present

## 2023-02-11 DIAGNOSIS — D696 Thrombocytopenia, unspecified: Secondary | ICD-10-CM

## 2023-02-11 DIAGNOSIS — R188 Other ascites: Secondary | ICD-10-CM | POA: Diagnosis not present

## 2023-02-11 DIAGNOSIS — K7460 Unspecified cirrhosis of liver: Secondary | ICD-10-CM | POA: Diagnosis present

## 2023-02-11 DIAGNOSIS — I9589 Other hypotension: Secondary | ICD-10-CM | POA: Diagnosis present

## 2023-02-11 DIAGNOSIS — D649 Anemia, unspecified: Secondary | ICD-10-CM | POA: Diagnosis not present

## 2023-02-11 DIAGNOSIS — I959 Hypotension, unspecified: Secondary | ICD-10-CM | POA: Diagnosis not present

## 2023-02-11 DIAGNOSIS — N179 Acute kidney failure, unspecified: Secondary | ICD-10-CM

## 2023-02-11 DIAGNOSIS — R161 Splenomegaly, not elsewhere classified: Secondary | ICD-10-CM | POA: Diagnosis not present

## 2023-02-11 DIAGNOSIS — K7031 Alcoholic cirrhosis of liver with ascites: Secondary | ICD-10-CM | POA: Diagnosis not present

## 2023-02-11 DIAGNOSIS — R0902 Hypoxemia: Secondary | ICD-10-CM | POA: Diagnosis not present

## 2023-02-11 DIAGNOSIS — R0600 Dyspnea, unspecified: Secondary | ICD-10-CM | POA: Diagnosis not present

## 2023-02-11 LAB — BLOOD GAS, VENOUS
Acid-base deficit: 4 mmol/L — ABNORMAL HIGH (ref 0.0–2.0)
Bicarbonate: 20.9 mmol/L (ref 20.0–28.0)
Drawn by: 3508
O2 Saturation: 49.4 %
Patient temperature: 36.4
pCO2, Ven: 36 mmHg — ABNORMAL LOW (ref 44–60)
pH, Ven: 7.37 (ref 7.25–7.43)
pO2, Ven: 33 mmHg (ref 32–45)

## 2023-02-11 LAB — COMPREHENSIVE METABOLIC PANEL
ALT: 35 U/L (ref 0–44)
AST: 48 U/L — ABNORMAL HIGH (ref 15–41)
Albumin: 2.9 g/dL — ABNORMAL LOW (ref 3.5–5.0)
Alkaline Phosphatase: 68 U/L (ref 38–126)
Anion gap: 11 (ref 5–15)
BUN: 77 mg/dL — ABNORMAL HIGH (ref 8–23)
CO2: 19 mmol/L — ABNORMAL LOW (ref 22–32)
Calcium: 9.4 mg/dL (ref 8.9–10.3)
Chloride: 103 mmol/L (ref 98–111)
Creatinine, Ser: 2.56 mg/dL — ABNORMAL HIGH (ref 0.44–1.00)
GFR, Estimated: 21 mL/min — ABNORMAL LOW (ref >60)
Glucose, Bld: 88 mg/dL (ref 70–99)
Potassium: 4.8 mmol/L (ref 3.5–5.1)
Sodium: 133 mmol/L — ABNORMAL LOW (ref 135–145)
Total Bilirubin: 8.4 mg/dL — ABNORMAL HIGH (ref 0.3–1.2)
Total Protein: 6 g/dL — ABNORMAL LOW (ref 6.5–8.1)

## 2023-02-11 LAB — CBC
HCT: 33.7 % — ABNORMAL LOW (ref 36.0–46.0)
Hemoglobin: 11.4 g/dL — ABNORMAL LOW (ref 12.0–15.0)
MCH: 30 pg (ref 26.0–34.0)
MCHC: 33.8 g/dL (ref 30.0–36.0)
MCV: 88.7 fL (ref 80.0–100.0)
Platelets: 75 10*3/uL — ABNORMAL LOW (ref 150–400)
RBC: 3.8 MIL/uL — ABNORMAL LOW (ref 3.87–5.11)
RDW: 16.8 % — ABNORMAL HIGH (ref 11.5–15.5)
WBC: 9.5 10*3/uL (ref 4.0–10.5)
nRBC: 0 % (ref 0.0–0.2)

## 2023-02-11 LAB — URINALYSIS, ROUTINE W REFLEX MICROSCOPIC
Bilirubin Urine: NEGATIVE
Glucose, UA: NEGATIVE mg/dL
Hgb urine dipstick: NEGATIVE
Ketones, ur: NEGATIVE mg/dL
Leukocytes,Ua: NEGATIVE
Nitrite: NEGATIVE
Protein, ur: NEGATIVE mg/dL
Specific Gravity, Urine: 1.016 (ref 1.005–1.030)
pH: 5 (ref 5.0–8.0)

## 2023-02-11 LAB — AMMONIA: Ammonia: 44 umol/L — ABNORMAL HIGH (ref 9–35)

## 2023-02-11 LAB — GLUCOSE, CAPILLARY: Glucose-Capillary: 78 mg/dL (ref 70–99)

## 2023-02-11 LAB — PROTIME-INR
INR: 2.1 — ABNORMAL HIGH (ref 0.8–1.2)
Prothrombin Time: 23.8 seconds — ABNORMAL HIGH (ref 11.4–15.2)

## 2023-02-11 LAB — ETHANOL: Alcohol, Ethyl (B): <10 mg/dL (ref <10)

## 2023-02-11 LAB — MRSA NEXT GEN BY PCR, NASAL: MRSA by PCR Next Gen: NOT DETECTED

## 2023-02-11 LAB — CBG MONITORING, ED: Glucose-Capillary: 76 mg/dL (ref 70–99)

## 2023-02-11 MED ORDER — ACETAMINOPHEN 650 MG RE SUPP: 650.0000 mg | Freq: Four times a day (QID) | RECTAL | Status: DC | PRN

## 2023-02-11 MED ORDER — FOLIC ACID 1 MG PO TABS
1.0000 mg | ORAL_TABLET | Freq: Every day | ORAL | Status: DC
  Administered 2023-02-12 – 2023-02-21 (×10): 1 mg via ORAL
  Filled 2023-02-11 (×10): qty 1

## 2023-02-11 MED ORDER — ACETAMINOPHEN 325 MG PO TABS
650.0000 mg | ORAL_TABLET | Freq: Four times a day (QID) | ORAL | Status: DC | PRN
  Administered 2023-02-12 – 2023-02-22 (×9): 650 mg via ORAL
  Filled 2023-02-11 (×8): qty 2

## 2023-02-11 MED ORDER — LACTULOSE 10 GM/15ML PO SOLN
10.0000 g | Freq: Three times a day (TID) | ORAL | Status: DC
  Administered 2023-02-11 – 2023-02-15 (×12): 10 g via ORAL
  Filled 2023-02-11 (×13): qty 30

## 2023-02-11 MED ORDER — MIDODRINE HCL 5 MG PO TABS
5.0000 mg | ORAL_TABLET | Freq: Every day | ORAL | Status: DC | PRN
  Administered 2023-02-12 – 2023-02-20 (×2): 5 mg via ORAL
  Filled 2023-02-11 (×2): qty 1

## 2023-02-11 MED ORDER — ONDANSETRON HCL 4 MG PO TABS
4.0000 mg | ORAL_TABLET | Freq: Four times a day (QID) | ORAL | Status: DC | PRN
  Administered 2023-02-17 – 2023-02-19 (×2): 4 mg via ORAL
  Filled 2023-02-11 (×3): qty 1

## 2023-02-11 MED ORDER — LACTATED RINGERS IV BOLUS
1000.0000 mL | Freq: Once | INTRAVENOUS | Status: AC
  Administered 2023-02-11: 1000 mL via INTRAVENOUS

## 2023-02-11 MED ORDER — ONDANSETRON HCL 4 MG PO TABS: 4.0000 mg | ORAL_TABLET | Freq: Four times a day (QID) | ORAL | Status: DC | PRN

## 2023-02-11 MED ORDER — PANTOPRAZOLE SODIUM 40 MG PO TBEC
40.0000 mg | DELAYED_RELEASE_TABLET | Freq: Two times a day (BID) | ORAL | Status: DC
  Administered 2023-02-11 – 2023-02-21 (×21): 40 mg via ORAL
  Filled 2023-02-11 (×21): qty 1

## 2023-02-11 MED ORDER — HYDROXYZINE HCL 25 MG PO TABS
25.0000 mg | ORAL_TABLET | Freq: Three times a day (TID) | ORAL | Status: DC | PRN
  Administered 2023-02-12 – 2023-02-21 (×10): 25 mg via ORAL
  Filled 2023-02-11 (×10): qty 1

## 2023-02-11 MED ORDER — LACTULOSE 10 GM/15ML PO SOLN
30.0000 g | ORAL | Status: AC
  Administered 2023-02-11: 30 g via ORAL
  Filled 2023-02-11: qty 60

## 2023-02-11 MED ORDER — DEXTROSE-SODIUM CHLORIDE 5-0.9 % IV SOLN: INTRAVENOUS | Status: DC

## 2023-02-11 MED ORDER — ADULT MULTIVITAMIN W/MINERALS CH
1.0000 | ORAL_TABLET | Freq: Every day | ORAL | Status: DC
  Administered 2023-02-12 – 2023-02-21 (×10): 1 via ORAL
  Filled 2023-02-11 (×10): qty 1

## 2023-02-11 MED ORDER — CHLORHEXIDINE GLUCONATE CLOTH 2 % EX PADS
6.0000 | MEDICATED_PAD | Freq: Every day | CUTANEOUS | Status: DC
  Administered 2023-02-11 – 2023-02-22 (×11): 6 via TOPICAL

## 2023-02-11 MED ORDER — ONDANSETRON HCL 4 MG/2ML IJ SOLN
4.0000 mg | Freq: Four times a day (QID) | INTRAMUSCULAR | Status: DC | PRN
  Administered 2023-02-12 – 2023-02-19 (×3): 4 mg via INTRAVENOUS
  Filled 2023-02-11 (×2): qty 2

## 2023-02-11 MED ORDER — TETANUS-DIPHTH-ACELL PERTUSSIS 5-2.5-18.5 LF-MCG/0.5 IM SUSY
0.5000 mL | PREFILLED_SYRINGE | Freq: Once | INTRAMUSCULAR | Status: AC
  Administered 2023-02-11: 0.5 mL via INTRAMUSCULAR
  Filled 2023-02-11: qty 0.5

## 2023-02-11 MED ORDER — ALBUTEROL SULFATE (2.5 MG/3ML) 0.083% IN NEBU
2.5000 mg | INHALATION_SOLUTION | RESPIRATORY_TRACT | Status: DC | PRN
  Administered 2023-02-12 – 2023-02-16 (×4): 2.5 mg via RESPIRATORY_TRACT
  Filled 2023-02-11 (×4): qty 3

## 2023-02-11 MED ORDER — THIAMINE MONONITRATE 100 MG PO TABS
100.0000 mg | ORAL_TABLET | Freq: Every day | ORAL | Status: DC
  Administered 2023-02-12 – 2023-02-21 (×10): 100 mg via ORAL
  Filled 2023-02-11 (×10): qty 1

## 2023-02-11 MED ORDER — LEVOTHYROXINE SODIUM 88 MCG PO TABS
88.0000 ug | ORAL_TABLET | Freq: Every day | ORAL | Status: DC
  Administered 2023-02-12 – 2023-02-22 (×11): 88 ug via ORAL
  Filled 2023-02-11 (×11): qty 1

## 2023-02-11 MED ORDER — LINACLOTIDE 145 MCG PO CAPS
145.0000 ug | ORAL_CAPSULE | Freq: Every day | ORAL | Status: DC
  Administered 2023-02-12 – 2023-02-20 (×9): 145 ug via ORAL
  Filled 2023-02-11 (×10): qty 1

## 2023-02-11 MED ORDER — FAMOTIDINE 20 MG PO TABS
20.0000 mg | ORAL_TABLET | Freq: Every day | ORAL | Status: DC
  Administered 2023-02-11 – 2023-02-21 (×11): 20 mg via ORAL
  Filled 2023-02-11 (×11): qty 1

## 2023-02-11 MED ORDER — ENOXAPARIN SODIUM 30 MG/0.3ML IJ SOSY
30.0000 mg | PREFILLED_SYRINGE | INTRAMUSCULAR | Status: DC
  Administered 2023-02-11 – 2023-02-14 (×4): 30 mg via SUBCUTANEOUS
  Filled 2023-02-11 (×4): qty 0.3

## 2023-02-11 NOTE — ED Notes (Signed)
Patient transported to CT 

## 2023-02-11 NOTE — Assessment & Plan Note (Addendum)
Pre renal renal failure.  Hyponatremia.   Plan to continue IV fluids with isotonic saline at 100 ml per hr Follow up renal function in am. Check renal US.  Monitor urine output.

## 2023-02-11 NOTE — Assessment & Plan Note (Addendum)
-  Likely multifactorial including dehydration, uremia from AKI and elevated ammonia level. -Continue the use of lactulose at adjusted dose and xifaxan as recommended by GI service. -Continue to maintain adequate hydration -Continue to follow response and continue constant reorientation. -Patient received treatment with midodrine and albumin; no hepatorenal syndrome presence demonstrated.

## 2023-02-11 NOTE — Progress Notes (Signed)
Pt daughter [name] called concerning CT results, she would like to speak with the Dr. In the morning.

## 2023-02-11 NOTE — Assessment & Plan Note (Signed)
Patient with hypotension at home has been on midodrine that will be continued in the hospital.

## 2023-02-11 NOTE — ED Notes (Signed)
Pt with 2 small skin tears to R arm, covered with gauze.  Small skin tear noted to L lower leg, also covered with gauze.

## 2023-02-11 NOTE — Assessment & Plan Note (Signed)
Anemia of chronic disease.  Related to chronic liver failure.  No current indication for blood products transfusion.  Continue close monitoring of cell counts.

## 2023-02-11 NOTE — ED Provider Notes (Signed)
[place] EMERGENCY DEPARTMENT AT [facility] Provider Note   CSN: [id number] Arrival date & time: 02/11/23  1047     History {Add pertinent medical, surgical, social history, OB history to HPI:1} Chief Complaint  Patient presents with   Altered Mental Status    Karla Young is a 62 y.o. female.  62 year old female with a history of alcoholic cirrhosis, hepatitis C status posttreatment, and COPD who presents to the emergency department with altered mental status.  Limited history available since patient is altered.  Initial history given to providers by her sister and daughter who are no longer with the patient.  Patient had a fall yesterday and was reportedly doing well until today when her sister came to pick her up for paracentesis and was very altered.  Per daughter, had a fall last Wednesday. Father was with her yesterday was incontinent of bowel and bladder in her bed. Unclear if she has been taking her lactulose. Had paracentesis last Wednesday where they drained 3.7 L. Being evulated for liver transplant. No etoh since thanksgiving of last year. No drug use recently.        Home Medications Prior to Admission medications   Medication Sig Start Date End Date Taking? Authorizing Provider  albuterol (PROVENTIL) (2.5 MG/3ML) 0.083% nebulizer solution Take 3 mLs (2.5 mg total) by nebulization every 2 (two) hours as needed for wheezing. 07/23/22   Shon Hale, MD  albuterol (VENTOLIN HFA) 108 (90 Base) MCG/ACT inhaler Inhale 2 puffs into the lungs every 6 (six) hours as needed for wheezing or shortness of breath. 07/23/22   Shon Hale, MD  famotidine (PEPCID) 20 MG tablet Take 1 tablet (20 mg total) by mouth at bedtime. 09/13/22   Gelene Mink, NP  folic acid (FOLVITE) 1 MG tablet Take 1 tablet (1 mg total) by mouth daily. 07/24/22   Shon Hale, MD  furosemide (LASIX) 40 MG tablet Take 0.5 tablets (20 mg total) by mouth daily. Patient taking differently:  Take 20-40 mg by mouth daily. 09/02/22   Dolores Frame, MD  hydrOXYzine (ATARAX) 25 MG tablet Take 1 tablet (25 mg total) by mouth every 8 (eight) hours as needed for anxiety or nausea. 07/23/22   Shon Hale, MD  lactulose (CHRONULAC) 10 GM/15ML solution Take 15 mLs (10 g total) by mouth 3 (three) times daily. Goal is to have 2 to 3 Mushy/Loose stools per Day 11/18/22   Tiffany Kocher, PA-C  levothyroxine (SYNTHROID) 88 MCG tablet Take 1 tablet (88 mcg total) by mouth daily before breakfast. 07/23/22   Shon Hale, MD  linaclotide (LINZESS) 145 MCG CAPS capsule Take 145 mcg by mouth daily before breakfast.    [provider]  midodrine (PROAMATINE) 5 MG tablet Take 5 mg by mouth daily as needed (for low BP).    [provider]  Multiple Vitamins-Minerals (MULTIVITAMIN WITH MINERALS) tablet Take 1 tablet by mouth daily. 07/23/22 07/23/23  Shon Hale, MD  ondansetron (ZOFRAN) 4 MG tablet Take 1 tablet (4 mg total) by mouth every 6 (six) hours as needed for nausea. 08/30/22   Gelene Mink, NP  pantoprazole (PROTONIX) 40 MG tablet Take 1 tablet (40 mg total) by mouth 2 (two) times daily. 09/13/22   Gelene Mink, NP  spironolactone (ALDACTONE) 100 MG tablet Take 0.5 tablets (50 mg total) by mouth daily. Patient taking differently: Take 50-100 mg by mouth daily. 09/02/22   Dolores Frame, MD  thiamine (VITAMIN B-1) 100 MG tablet Take 1  tablet (100 mg total) by mouth daily. 07/24/22   Shon Hale, MD      Allergies    Patient has no known allergies.    Review of Systems   Review of Systems  Physical Exam Updated Vital Signs BP (!) 102/54   Pulse 72   Temp (!) 97.5 F (36.4 C) (Oral)   Resp 17   Ht 5\' 7"  (1.702 m)   Wt 61.2 kg   SpO2 96%   BMI 21.14 kg/m  Physical Exam Vitals and nursing note reviewed.  Constitutional:      General: She is not in acute distress.    Appearance: She is well-developed. She is ill-appearing.      Comments: Alert and oriented to self only.  Intermittently following commands.  HENT:     Head: Normocephalic and atraumatic.     Right Ear: External ear normal.     Left Ear: External ear normal.     Nose: Nose normal.  Eyes:     General: Scleral icterus present.     Extraocular Movements: Extraocular movements intact.     Conjunctiva/sclera: Conjunctivae normal.     Pupils: Pupils are equal, round, and reactive to light.     Comments: Pupils 3 mm bilaterally  Cardiovascular:     Rate and Rhythm: Normal rate and regular rhythm.     Heart sounds: No murmur heard. Pulmonary:     Effort: Pulmonary effort is normal. No respiratory distress.     Breath sounds: Normal breath sounds.  Abdominal:     General: Abdomen is flat. There is distension.     Palpations: Abdomen is soft. There is no mass.     Tenderness: There is no abdominal tenderness. There is no guarding.  Musculoskeletal:     Cervical back: Normal range of motion and neck supple.  Skin:    General: Skin is warm and dry.  Neurological:     Mental Status: She is alert.     Comments: Cranial nerves II through XII grossly intact.  Moving all 4 extremities equally.  Unable to comply with remainder of neurologic exam.     ED Results / Procedures / Treatments   Labs (all labs ordered are listed, but only abnormal results are displayed) Labs Reviewed  CBC - Abnormal; Notable for the following components:      Result Value   RBC 3.80 (*)    Hemoglobin 11.4 (*)    HCT 33.7 (*)    RDW 16.8 (*)    All other components within normal limits  COMPREHENSIVE METABOLIC PANEL  AMMONIA  PROTIME-INR  ETHANOL  RAPID URINE DRUG SCREEN, HOSP PERFORMED  CBG MONITORING, ED    EKG None  Radiology No results found.  Procedures Procedures  {Document cardiac monitor, telemetry assessment procedure when appropriate:1}  Medications Ordered in ED Medications - No data to display  ED Course/ Medical Decision Making/ A&P   {    Click here for ABCD2, HEART and other calculatorsREFRESH Note before signing :1}                          Medical Decision Making Amount and/or Complexity of Data Reviewed Labs: ordered. Radiology: ordered.  Risk Prescription drug management.   ***  {Document critical care time when appropriate:1} {Document review of labs and clinical decision tools ie heart score, Chads2Vasc2 etc:1}  {Document your independent review of radiology images, and any outside records:1} {Document your discussion with family  members, caretakers, and with consultants:1} {Document social determinants of health affecting pt's care:1} {Document your decision making why or why not admission, treatments were needed:1} Final Clinical Impression(s) / ED Diagnoses Final diagnoses:  None    Rx / DC Orders ED Discharge Orders     None

## 2023-02-11 NOTE — ED Notes (Signed)
Attempted to call report x 1  

## 2023-02-11 NOTE — ED Notes (Signed)
NT assisted pt to the restroom, unable to collect urine specimen due to pt inability to wait for collection container to be placed in toilet. Will attempt again once pt expresses need to void.

## 2023-02-11 NOTE — ED Triage Notes (Signed)
Pt sister wne to her house to take pt to [facility] to get a paracentesis and noticed pt was confused and altered. Pt lives alone and daughter who was on the phone said pt fell yesterday sometime. Bruises noted to pt in different areas.

## 2023-02-11 NOTE — Assessment & Plan Note (Signed)
Her liver failure is advanced.  MELD score is 33.  Continue neuro checks and current lactulose dose.  Her family wants to avoid narcotics, patient being in the transplant list. Continue supportive medical therapy.

## 2023-02-11 NOTE — ED Notes (Signed)
Pt back from CT

## 2023-02-11 NOTE — Assessment & Plan Note (Signed)
Continue with levothyroxine.  Will check TSH.

## 2023-02-11 NOTE — H&P (Signed)
History and Physical    Patient: Karla Young ZOX:[id number] DOB: [birth year] DOA: 02/11/2023 DOS: the patient was seen and examined on 02/11/2023 PCP: Wilmon Pali, FNP  Patient coming from: Home  Chief Complaint:  Chief Complaint  Patient presents with   Altered Mental Status   HPI: Karla Young is a 62 y.o. female with medical history significant of cirrhosis, COPD, hypotension, hypothyroid, hepatitis C, anxiety and fibromyalgia who presented with altered mental status.  Patient currently is in a waiting list for liver transplant, she is getting outpatient paracentesis every week per GI recommendations. Last paracentesis 06.25.24 removing 2,7 L.  Yesterday afternoon patient was noted to be somnolent and less reactive. This morning when her family went to pick her up to bring her for outpatient paracentesis she was noted to be poorly responsive, very weak and deconditioned. Apparently she had falls at home that were unwitnessed.  Because of her severe change in mentation, EMS was called and she was transported to the ED.  Per her daughter, who is providing the history, it is possible that patient was not taking her medications or having adequate po intake over last 24 hrs.  Patient is not able to give any information due to somnolence.    Review of Systems: unable to review all systems due to the inability of the patient to answer questions. Past Medical History:  Diagnosis Date   Anxiety    Cirrhosis (HCC)    COPD (chronic obstructive pulmonary disease) (HCC)    Fibromyalgia    GERD (gastroesophageal reflux disease)    Heart murmur    Hepatitis C    Hypertension    Hypotension    Hypothyroid    Migraines    MRSA (methicillin resistant staph aureus) culture positive    Past Surgical History:  Procedure Laterality Date   ABDOMINAL HYSTERECTOMY     partial   BACK SURGERY     lumbar removal of disc   BIOPSY  09/06/2014   Procedure: BIOPSY;  Surgeon: Malissa Hippo, MD;   Location: [facility];  Service: Endoscopy;;   CHOLECYSTECTOMY     COLONOSCOPY WITH PROPOFOL N/A 09/06/2014   Procedure: COLONOSCOPY WITH PROPOFOL (Procedure #2) At cecum at 0819; total withdrawal time=13 minutes;  Surgeon: Malissa Hippo, MD;  Location: [facility];  Service: Endoscopy;  Laterality: N/A;   COLONOSCOPY WITH PROPOFOL N/A 12/30/2022   Procedure: COLONOSCOPY WITH PROPOFOL;  Surgeon: Dolores Frame, MD;  Location: [facility];  Service: Gastroenterology;  Laterality: N/A;  11:00am;asa 3   ESOPHAGEAL DILATION N/A 09/06/2014   Procedure: ESOPHAGEAL DILATION WITH MALONEY DILATOR ;  Surgeon: Malissa Hippo, MD;  Location: [facility];  Service: Endoscopy;  Laterality: N/A;   ESOPHAGOGASTRODUODENOSCOPY (EGD) WITH PROPOFOL N/A 09/06/2014   Procedure: ESOPHAGOGASTRODUODENOSCOPY (EGD) WITH PROPOFOL (Procedure #1) ;  Surgeon: Malissa Hippo, MD;  Location: [facility];  Service: Endoscopy;  Laterality: N/A;   ESOPHAGOGASTRODUODENOSCOPY (EGD) WITH PROPOFOL N/A 09/02/2021   Procedure: ESOPHAGOGASTRODUODENOSCOPY (EGD) WITH PROPOFOL;  Surgeon: Dolores Frame, MD;  Location: [facility];  Service: Gastroenterology;  Laterality: N/A;  830   ESOPHAGOGASTRODUODENOSCOPY (EGD) WITH PROPOFOL N/A 09/02/2022   Procedure: ESOPHAGOGASTRODUODENOSCOPY (EGD) WITH PROPOFOL;  Surgeon: Dolores Frame, MD;  Location: [facility];  Service: Gastroenterology;  Laterality: N/A;  1030am, asa 3   HEMORRHOID SURGERY     Dr Vicenta Aly   HEMOSTASIS CLIP PLACEMENT  12/30/2022   Procedure: HEMOSTASIS CLIP PLACEMENT;  Surgeon: Dolores Frame, MD;  Location: [facility]  END[facility] SU[facility];  Service: Gastroenterology;;   IR RADIOLOGIST EVAL & MGMT  08/26/2022   IR RADIOLOGIST EVAL & MGMT  09/22/2022   PARTIAL HYSTERECTOMY      One ovary left   POLYPECTOMY  12/30/2022   Procedure: POLYPECTOMY;  Surgeon: Dolores Frame, MD;  Location: [facility];  Service: Gastroenterology;;   RECTAL PROLAPSE  REPAIR     WRIST SURGERY  1997   Social History:  reports that she has quit smoking. Her smoking use included cigarettes. She has a 7.50 pack-year smoking history. She has been exposed to tobacco smoke. She has never used smokeless tobacco. She reports that she does not currently use alcohol. She reports that she does not use drugs.  No Known Allergies  Family History  Problem Relation Age of Onset   Colon cancer Neg Hx    Colon polyps Neg Hx     Prior to Admission medications   Medication Sig Start Date End Date Taking? Authorizing Provider  famotidine (PEPCID) 20 MG tablet Take 1 tablet (20 mg total) by mouth at bedtime. 09/13/22  Yes Gelene Mink, NP  folic acid (FOLVITE) 1 MG tablet Take 1 tablet (1 mg total) by mouth daily. 07/24/22  Yes Emokpae, Courage, MD  furosemide (LASIX) 40 MG tablet Take 0.5 tablets (20 mg total) by mouth daily. Patient taking differently: Take 20-40 mg by mouth daily. 09/02/22  Yes Dolores Frame, MD  hydrOXYzine (ATARAX) 25 MG tablet Take 1 tablet (25 mg total) by mouth every 8 (eight) hours as needed for anxiety or nausea. 07/23/22  Yes Emokpae, Courage, MD  lactulose (CHRONULAC) 10 GM/15ML solution Take 15 mLs (10 g total) by mouth 3 (three) times daily. Goal is to have 2 to 3 Mushy/Loose stools per Day 11/18/22  Yes Tiffany Kocher, PA-C  levothyroxine (SYNTHROID) 88 MCG tablet Take 1 tablet (88 mcg total) by mouth daily before breakfast. 07/23/22  Yes Emokpae, Courage, MD  linaclotide (LINZESS) 145 MCG CAPS capsule Take 145 mcg by mouth daily before breakfast.   Yes [provider]  midodrine (PROAMATINE) 5 MG tablet Take 5 mg by mouth daily as needed (for low BP).   Yes [provider]  Multiple Vitamins-Minerals (MULTIVITAMIN WITH MINERALS) tablet Take 1 tablet by mouth daily. 07/23/22 07/23/23 Yes Emokpae, Courage, MD  ondansetron (ZOFRAN) 4 MG tablet Take 1 tablet (4 mg total) by mouth every 6 (six) hours as needed for nausea.  08/30/22  Yes Gelene Mink, NP  pantoprazole (PROTONIX) 40 MG tablet Take 1 tablet (40 mg total) by mouth 2 (two) times daily. 09/13/22  Yes Gelene Mink, NP  spironolactone (ALDACTONE) 100 MG tablet Take 0.5 tablets (50 mg total) by mouth daily. Patient taking differently: Take 50-100 mg by mouth daily. 09/02/22  Yes Dolores Frame, MD  thiamine (VITAMIN B-1) 100 MG tablet Take 1 tablet (100 mg total) by mouth daily. 07/24/22  Yes Emokpae, Courage, MD  albuterol (PROVENTIL) (2.5 MG/3ML) 0.083% nebulizer solution Take 3 mLs (2.5 mg total) by nebulization every 2 (two) hours as needed for wheezing. Patient not taking: Reported on 02/11/2023 07/23/22   Shon Hale, MD  XIFAXAN 550 MG TABS tablet Take 550 mg by mouth 2 (two) times daily.    [provider]    Physical Exam: Vitals:   02/11/23 1100 02/11/23 1109 02/11/23 1509  BP: (!) 102/54    Pulse: 72    Resp: 17    Temp: (!) 97.5 F (36.4 C)  TempSrc: Oral    SpO2: 96%    Weight:  61.2 kg 63.5 kg  Height:  5\' 7"  (1.702 m) 5\' 1"  (1.549 m)   Neurology patient is somnolent but opens eyes to voice and light touch, she is able to respond to simple questions with yes and no answers. To weak to follow motor commands.  No asterixis or nystagmus.  ENT positive pallor with no icterus, dry mucous membranes Cardiovascular with S1 and S2 present and rhythmic with no gallops, rubs or murmurs Respiratory anterior auscultation with no wheezing rales or rhonchi Abdomen with mild distention, non tender to palpation, no rebound or guarding, no significant fluid wave. Lower extremities with trace pitting edema,  No rashes  Data Reviewed:   Na 133, K 4,8 Cl 103, bicarbonate 19, glucose 88, bun 77, cr 2,56  AST 48, ALT 35, Total bilirubin 8,4  Ammonia 44 Wbc 9,5 hgb 11.4 plt 75  INR 2,5   Head CT with no acute changes. Patchy low attenuation in the deep hemispheric and periventricular white matter.   EKG 68 bpm, normal  axis, normal intervals, sinus rhythm with no significant ST segment or  T wave changes.   62 yo female with advance chronic liver failure due to cirrhosis, presents with altered mental status. No focal deficits. She is hemodynamically stable, but has signs of hypovolemia.  Her serum cr and BUN are elevated.  Mild elevation of ammonia.   Dx hypovolemic acute renal failure, in the setting of chronic liver failure.   Assessment and Plan: * Acute metabolic encephalopathy Likely multifactorial including dehydration and uremia from AKI. Ammonia is elevated but she has not asterixis or signs of hepatic encephalopathy. Follow up on urine analysis.   AKI (acute kidney injury) (HCC) Pre renal renal failure.  Hyponatremia.   Plan to continue IV fluids with isotonic saline at 100 ml per hr Follow up renal function in am. Check renal US.  Monitor urine output.   Cirrhosis of liver with ascites (HCC) Her liver failure is advanced.  MELD score is 33.  Continue neuro checks and current lactulose dose.  Her family wants to avoid narcotics, patient being in the transplant list. Continue supportive medical therapy.    COPD (chronic obstructive pulmonary disease) (HCC) No signs of exacerbation.   Hypothyroidism Continue with levothyroxine.  Will check TSH.   HTN (hypertension) Patient with hypotension at home has been on midodrine that will be continued in the hospital.   Thrombocytopenia (HCC) Anemia of chronic disease.  Related to chronic liver failure.  No current indication for blood products transfusion.  Continue close monitoring of cell counts.       Advance Care Planning:   Code Status: Full Code   Consults: none   Family Communication: I spoke with patient's daughter over the phone , we talked in detail about patient's condition, plan of care and prognosis and all questions were addressed.   Severity of Illness: The appropriate patient status for this patient is  INPATIENT. Inpatient status is judged to be reasonable and necessary in order to provide the required intensity of service to ensure the patient's safety. The patient's presenting symptoms, physical exam findings, and initial radiographic and laboratory data in the context of their chronic comorbidities is felt to place them at high risk for further clinical deterioration. Furthermore, it is not anticipated that the patient will be medically stable for discharge from the hospital within 2 midnights of admission.   * I certify that at the  point of admission it is my clinical judgment that the patient will require inpatient hospital care spanning beyond 2 midnights from the point of admission due to high intensity of service, high risk for further deterioration and high frequency of surveillance required.*  Author: Coralie Keens, MD 02/11/2023 3:30 PM  For on call review [email or web address].

## 2023-02-11 NOTE — Assessment & Plan Note (Signed)
No signs of exacerbation.

## 2023-02-12 DIAGNOSIS — K7460 Unspecified cirrhosis of liver: Secondary | ICD-10-CM

## 2023-02-12 DIAGNOSIS — D696 Thrombocytopenia, unspecified: Secondary | ICD-10-CM | POA: Diagnosis not present

## 2023-02-12 DIAGNOSIS — G9341 Metabolic encephalopathy: Secondary | ICD-10-CM | POA: Diagnosis not present

## 2023-02-12 DIAGNOSIS — I10 Essential (primary) hypertension: Secondary | ICD-10-CM | POA: Diagnosis not present

## 2023-02-12 DIAGNOSIS — N179 Acute kidney failure, unspecified: Secondary | ICD-10-CM | POA: Diagnosis not present

## 2023-02-12 DIAGNOSIS — J449 Chronic obstructive pulmonary disease, unspecified: Secondary | ICD-10-CM

## 2023-02-12 DIAGNOSIS — R188 Other ascites: Secondary | ICD-10-CM

## 2023-02-12 LAB — CBC
HCT: 27.9 % — ABNORMAL LOW (ref 36.0–46.0)
Hemoglobin: 9.8 g/dL — ABNORMAL LOW (ref 12.0–15.0)
MCH: 30.2 pg (ref 26.0–34.0)
MCHC: 35.1 g/dL (ref 30.0–36.0)
MCV: 86.1 fL (ref 80.0–100.0)
Platelets: 68 10*3/uL — ABNORMAL LOW (ref 150–400)
RBC: 3.24 MIL/uL — ABNORMAL LOW (ref 3.87–5.11)
RDW: 16.7 % — ABNORMAL HIGH (ref 11.5–15.5)
WBC: 7.6 10*3/uL (ref 4.0–10.5)
nRBC: 0 % (ref 0.0–0.2)

## 2023-02-12 LAB — BASIC METABOLIC PANEL
Anion gap: 9 (ref 5–15)
BUN: 71 mg/dL — ABNORMAL HIGH (ref 8–23)
CO2: 18 mmol/L — ABNORMAL LOW (ref 22–32)
Calcium: 8.7 mg/dL — ABNORMAL LOW (ref 8.9–10.3)
Chloride: 106 mmol/L (ref 98–111)
Creatinine, Ser: 1.95 mg/dL — ABNORMAL HIGH (ref 0.44–1.00)
GFR, Estimated: 29 mL/min — ABNORMAL LOW (ref >60)
Glucose, Bld: 98 mg/dL (ref 70–99)
Potassium: 4 mmol/L (ref 3.5–5.1)
Sodium: 133 mmol/L — ABNORMAL LOW (ref 135–145)

## 2023-02-12 LAB — HIV ANTIBODY (ROUTINE TESTING W REFLEX): HIV Screen 4th Generation wRfx: NONREACTIVE

## 2023-02-12 MED ORDER — CARMEX CLASSIC LIP BALM EX OINT: 1.0000 | TOPICAL_OINTMENT | CUTANEOUS | Status: DC | PRN

## 2023-02-12 MED ORDER — ALBUMIN HUMAN 25 % IV SOLN
25.0000 g | Freq: Three times a day (TID) | INTRAVENOUS | Status: AC
  Administered 2023-02-12 – 2023-02-13 (×3): 25 g via INTRAVENOUS
  Filled 2023-02-12 (×3): qty 100

## 2023-02-12 MED ORDER — MIDODRINE HCL 5 MG PO TABS
2.5000 mg | ORAL_TABLET | Freq: Two times a day (BID) | ORAL | Status: DC
  Administered 2023-02-12 – 2023-02-20 (×16): 2.5 mg via ORAL
  Filled 2023-02-12 (×16): qty 1

## 2023-02-12 MED ORDER — LACTATED RINGERS IV SOLN: INTRAVENOUS | Status: AC

## 2023-02-12 NOTE — Progress Notes (Signed)
Progress Note   Patient: Karla Young ZOX:[id number] DOB: [birth year] DOA: 02/11/2023     1 DOS: the patient was seen and examined on 02/12/2023   Brief hospital admission narrative : As per H&P written by Dr. Ella Jubilee on 02/11/2023 Karla Young is a 62 y.o. female with medical history significant of cirrhosis, COPD, hypotension, hypothyroid, hepatitis C, anxiety and fibromyalgia who presented with altered mental status.  Patient currently is in a waiting list for liver transplant, she is getting outpatient paracentesis every week per GI recommendations. Last paracentesis 06.25.24 removing 2,7 L.  Yesterday afternoon patient was noted to be somnolent and less reactive. This morning when her family went to pick her up to bring her for outpatient paracentesis she was noted to be poorly responsive, very weak and deconditioned. Apparently she had falls at home that were unwitnessed.  Because of her severe change in mentation, EMS was called and she was transported to the ED.   Per her daughter, who is providing the history, it is possible that patient was not taking her medications or having adequate po intake over last 24 hrs.  Patient is not able to give any information due to somnolence.   Assessment and Plan: * Acute metabolic encephalopathy -Likely multifactorial including dehydration, uremia from AKI and elevated ammonia level. -Continue the use of lactulose -Continue to maintain adequate hydration -Continue to follow response to her renal function -Albumin and midodrine will be provided -GI service contacted with concerns of hepatorenal syndrome development.  AKI (acute kidney injury) (HCC) -Most likely prerenal; but there is concerns for hepatorenal syndrome -Continue with gentle fluid resuscitation -Stop the use of diuretics -Midodrine and albumin will be provided -Follow renal function trend.   -Patient denies dysuria and hematuria.  Cirrhosis of liver with ascites (HCC) -Her liver  failure is advanced.  -Actively following with [facility] transplant list in [place] -Continue PPI and lactulose -GI service consulted and will follow any further recommendations. -Holding diuretics and paracentesis in the setting of acute kidney injury.   COPD (chronic obstructive pulmonary disease) (HCC) -No signs of exacerbation.  -Continue as needed bronchodilator management.  Hypothyroidism -TSH within normal limit -Continue Synthroid.  HTN (hypertension) -Stable/soft -Low-dose midodrine will be started with intention to keep patient systolic blood pressure above 811 and guarantee MAP above 65 at all times.  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     Subjective:  After discussing with patient's daughter I was made aware that patient is still not back to baseline mentation; demonstrating some improvement in her renal function and electrolytes.  Still with concerns of hepatorenal syndrome.  Positive icterus appreciated.  Physical Exam: Vitals:   02/12/23 0900 02/12/23 1100 02/12/23 1120 02/12/23 1621  BP: (!) 94/46 (!) 100/53    Pulse: 61 62    Resp: 14 13    Temp:   98.3 F (36.8 C) 98.1 F (36.7 C)  TempSrc:   Oral Oral  SpO2: 100% 100%    Weight:      Height:       General exam: Alert, awake, oriented x2; still demonstrating decreased insight from baseline.  No fever, no chest pain, no nausea or vomiting.  Improving.  Positive icterus Respiratory system: Clear to auscultation. Respiratory effort normal.  Good saturation on room air. Cardiovascular system:RRR. No rubs or gallops; no JVD. Gastrointestinal system: Abdomen is distended with positive ascites; positive bowel sounds, no guarding, no tenderness. Central nervous  system: No focal neurological deficits. Extremities: No cyanosis or clubbing. Skin: No petechiae. Psychiatry: Judgement and insight impaired secondary to  acute encephalopathic process.  Data Reviewed: Basic metabolic panel: Sodium 133, potassium 4.0, chloride 106, bicarb 15, glucose 98, BUN 71, creatinine 1.95 and GFR 29 CBC: WBC 7.6, hemoglobin 9.8, platelet count 68 K  Family Communication: Daughter over the phone.  Disposition: Status is: Inpatient Remains inpatient appropriate because: Continue acute IV therapy and management for acute kidney injury and concerns for hepatorenal syndrome.   Planned Discharge Destination: Home  Time spent: 50 minutes  Author: Vassie Loll, MD 02/12/2023 5:33 PM  For on call review [email or web address].

## 2023-02-13 DIAGNOSIS — D696 Thrombocytopenia, unspecified: Secondary | ICD-10-CM | POA: Diagnosis not present

## 2023-02-13 DIAGNOSIS — N179 Acute kidney failure, unspecified: Secondary | ICD-10-CM | POA: Diagnosis not present

## 2023-02-13 DIAGNOSIS — I10 Essential (primary) hypertension: Secondary | ICD-10-CM | POA: Diagnosis not present

## 2023-02-13 DIAGNOSIS — N289 Disorder of kidney and ureter, unspecified: Secondary | ICD-10-CM

## 2023-02-13 DIAGNOSIS — G9341 Metabolic encephalopathy: Secondary | ICD-10-CM | POA: Diagnosis not present

## 2023-02-13 DIAGNOSIS — K7682 Hepatic encephalopathy: Secondary | ICD-10-CM | POA: Diagnosis not present

## 2023-02-13 LAB — URINALYSIS, ROUTINE W REFLEX MICROSCOPIC
Bilirubin Urine: NEGATIVE
Glucose, UA: NEGATIVE mg/dL
Hgb urine dipstick: NEGATIVE
Ketones, ur: NEGATIVE mg/dL
Leukocytes,Ua: NEGATIVE
Nitrite: NEGATIVE
Protein, ur: NEGATIVE mg/dL
Specific Gravity, Urine: 1.012 (ref 1.005–1.030)
pH: 6 (ref 5.0–8.0)

## 2023-02-13 LAB — COMPREHENSIVE METABOLIC PANEL
ALT: 25 U/L (ref 0–44)
AST: 31 U/L (ref 15–41)
Albumin: 3 g/dL — ABNORMAL LOW (ref 3.5–5.0)
Alkaline Phosphatase: 49 U/L (ref 38–126)
Anion gap: 7 (ref 5–15)
BUN: 53 mg/dL — ABNORMAL HIGH (ref 8–23)
CO2: 19 mmol/L — ABNORMAL LOW (ref 22–32)
Calcium: 8.9 mg/dL (ref 8.9–10.3)
Chloride: 107 mmol/L (ref 98–111)
Creatinine, Ser: 1.42 mg/dL — ABNORMAL HIGH (ref 0.44–1.00)
GFR, Estimated: 42 mL/min — ABNORMAL LOW (ref >60)
Glucose, Bld: 91 mg/dL (ref 70–99)
Potassium: 4 mmol/L (ref 3.5–5.1)
Sodium: 133 mmol/L — ABNORMAL LOW (ref 135–145)
Total Bilirubin: 6.3 mg/dL — ABNORMAL HIGH (ref 0.3–1.2)
Total Protein: 5.2 g/dL — ABNORMAL LOW (ref 6.5–8.1)

## 2023-02-13 MED ORDER — RIFAXIMIN 550 MG PO TABS
550.0000 mg | ORAL_TABLET | Freq: Two times a day (BID) | ORAL | Status: DC
  Administered 2023-02-13 – 2023-02-21 (×18): 550 mg via ORAL
  Filled 2023-02-13 (×18): qty 1

## 2023-02-13 MED ORDER — LACTATED RINGERS IV SOLN: INTRAVENOUS | Status: AC

## 2023-02-13 NOTE — Plan of Care (Signed)
  Problem: Clinical Measurements: Goal: Will remain free from infection Outcome: Progressing Goal: Diagnostic test results will improve Outcome: Progressing Goal: Respiratory complications will improve Outcome: Progressing Goal: Cardiovascular complication will be avoided Outcome: Progressing   Problem: Activity: Goal: Risk for activity intolerance will decrease Outcome: Progressing   Problem: Elimination: Goal: Will not experience complications related to bowel motility Outcome: Progressing Goal: Will not experience complications related to urinary retention Outcome: Progressing   Problem: Safety: Goal: Ability to remain free from injury will improve Outcome: Progressing   Problem: Skin Integrity: Goal: Risk for impaired skin integrity will decrease Outcome: Progressing   Problem: Education: Goal: Knowledge of General Education information will improve Description: Including pain rating scale, medication(s)/side effects and non-pharmacologic comfort measures Outcome: Not Progressing   Problem: Health Behavior/Discharge Planning: Goal: Ability to manage health-related needs will improve Outcome: Not Progressing   Problem: Clinical Measurements: Goal: Ability to maintain clinical measurements within normal limits will improve Outcome: Not Progressing   Problem: Nutrition: Goal: Adequate nutrition will be maintained Outcome: Not Progressing   Problem: Coping: Goal: Level of anxiety will decrease Outcome: Not Progressing   Problem: Pain Managment: Goal: General experience of comfort will improve Outcome: Not Progressing

## 2023-02-13 NOTE — Progress Notes (Addendum)
   02/13/23 1123  TOC Brief Assessment  Insurance and Status Reviewed  Patient has primary care physician Yes  Home environment has been reviewed Home alone  Prior level of function: Independent  Prior/Current Home Services No current home services  Social Determinants of Health Reivew SDOH reviewed needs interventions  Readmission risk has been reviewed Yes (Yellow)  Transition of care needs transition of care needs identified, TOC will continue to follow   Active consult.

## 2023-02-13 NOTE — Consult Note (Signed)
Referring Provider: No ref. provider found Primary Care Physician:  Wilmon Pali, FNP Primary Gastroenterologist:  Dr. 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  Presented to the ED via EMS overtly encephalopathic but not in coma.  Acute kidney injury.  Admission MELD labs = 33. Outpatient regimen includes Aldactone and Lasix.  On Daily lactulose; Xifaxan added by transplant folks last week but patient did not get prescription filled yet.  Frequent paracentesis.  Last 6/25  - 2 L removed; SBP negative by cell count.  Alphonse Guild, her daughter who is a Engineer, civil (consulting), is very attentive and provides the bulk of the history. Over the couple days prior to admission decline in mental status.  There may have been a decline in oral intake.  Meds at home may not have been taken as prescribed during this period.  Midodrine at home as needed for episodes of low blood pressure. Ms. Phineas Real notes patient was at her neurological baseline last weekend.  She also notes patient continues to drive. She is able to take oral lactulose. No evidence of GI bleed.  Review of the medical record reveals undergoing transplant evaluation and is about to being listed.  She is supposed to see the transplant surgeon about a week from now.  CT of the head negative for bleed, tumor. Patient's renal function has markedly improved with gentle hydration and holding diuretics.   Past Medical History:  Diagnosis Date   Anxiety    Cirrhosis (HCC)    COPD (chronic obstructive pulmonary disease) (HCC)    Fibromyalgia    GERD (gastroesophageal reflux disease)    Heart murmur    Hepatitis C    Hypertension    Hypotension    Hypothyroid    Migraines    MRSA (methicillin  resistant staph aureus) culture positive     Past Surgical History:  Procedure Laterality Date   ABDOMINAL HYSTERECTOMY     partial   BACK SURGERY     lumbar removal of disc   BIOPSY  09/06/2014   Procedure: BIOPSY;  Surgeon: Malissa Hippo, MD;  Location: [facility];  Service: Endoscopy;;   CHOLECYSTECTOMY     COLONOSCOPY WITH PROPOFOL N/A 09/06/2014   Procedure: COLONOSCOPY WITH PROPOFOL (Procedure #2) At cecum at 0819; total withdrawal time=13 minutes;  Surgeon: Malissa Hippo, MD;  Location: [facility];  Service: Endoscopy;  Laterality: N/A;   COLONOSCOPY WITH PROPOFOL N/A 12/30/2022   Procedure: COLONOSCOPY WITH PROPOFOL;  Surgeon: Dolores Frame, MD;  Location: [facility];  Service: Gastroenterology;  Laterality: N/A;  11:00am;asa 3   ESOPHAGEAL DILATION N/A 09/06/2014   Procedure: ESOPHAGEAL DILATION WITH MALONEY DILATOR ;  Surgeon: Malissa Hippo, MD;  Location: [facility];  Service: Endoscopy;  Laterality: N/A;   ESOPHAGOGASTRODUODENOSCOPY (EGD) WITH PROPOFOL N/A 09/06/2014   Procedure: ESOPHAGOGASTRODUODENOSCOPY (EGD) WITH PROPOFOL (Procedure #1) ;  Surgeon: Malissa Hippo, MD;  Location: [facility];  Service: Endoscopy;  Laterality: N/A;   ESOPHAGOGASTRODUODENOSCOPY (EGD) WITH PROPOFOL N/A 09/02/2021   Procedure: ESOPHAGOGASTRODUODENOSCOPY (EGD) WITH PROPOFOL;  Surgeon: Dolores Frame, MD;  Location: [facility];  Service: Gastroenterology;  Laterality: N/A;  830   ESOPHAGOGASTRODUODENOSCOPY (EGD) WITH PROPOFOL N/A 09/02/2022   Procedure: ESOPHAGOGASTRODUODENOSCOPY (EGD) WITH PROPOFOL;  Surgeon: Dolores Frame, MD;  Location:  [facility];  Service: Gastroenterology;  Laterality: N/A;  1030am, asa 3   HEMORRHOID SURGERY     Dr Vicenta Aly   HEMOSTASIS CLIP PLACEMENT  12/30/2022   Procedure: HEMOSTASIS CLIP PLACEMENT;  Surgeon: Dolores Frame, MD;  Location: [facility];  Service: Gastroenterology;;   IR RADIOLOGIST EVAL & MGMT   08/26/2022   IR RADIOLOGIST EVAL & MGMT  09/22/2022   PARTIAL HYSTERECTOMY      One ovary left   POLYPECTOMY  12/30/2022   Procedure: POLYPECTOMY;  Surgeon: Dolores Frame, MD;  Location: [facility];  Service: Gastroenterology;;   RECTAL PROLAPSE REPAIR     WRIST SURGERY  1997    Prior to Admission medications   Medication Sig Start Date End Date Taking? Authorizing Provider  famotidine (PEPCID) 20 MG tablet Take 1 tablet (20 mg total) by mouth at bedtime. 09/13/22  Yes Gelene Mink, NP  folic acid (FOLVITE) 1 MG tablet Take 1 tablet (1 mg total) by mouth daily. 07/24/22  Yes Emokpae, Courage, MD  furosemide (LASIX) 40 MG tablet Take 0.5 tablets (20 mg total) by mouth daily. Patient taking differently: Take 20-40 mg by mouth daily. 09/02/22  Yes Dolores Frame, MD  hydrOXYzine (ATARAX) 25 MG tablet Take 1 tablet (25 mg total) by mouth every 8 (eight) hours as needed for anxiety or nausea. 07/23/22  Yes Emokpae, Courage, MD  lactulose (CHRONULAC) 10 GM/15ML solution Take 15 mLs (10 g total) by mouth 3 (three) times daily. Goal is to have 2 to 3 Mushy/Loose stools per Day 11/18/22  Yes Tiffany Kocher, PA-C  levothyroxine (SYNTHROID) 88 MCG tablet Take 1 tablet (88 mcg total) by mouth daily before breakfast. 07/23/22  Yes Emokpae, Courage, MD  linaclotide (LINZESS) 145 MCG CAPS capsule Take 145 mcg by mouth daily before breakfast.   Yes [provider]  midodrine (PROAMATINE) 5 MG tablet Take 5 mg by mouth daily as needed (for low BP).   Yes [provider]  Multiple Vitamins-Minerals (MULTIVITAMIN WITH MINERALS) tablet Take 1 tablet by mouth daily. 07/23/22 07/23/23 Yes Emokpae, Courage, MD  ondansetron (ZOFRAN) 4 MG tablet Take 1 tablet (4 mg total) by mouth every 6 (six) hours as needed for nausea. 08/30/22  Yes Gelene Mink, NP  pantoprazole (PROTONIX) 40 MG tablet Take 1 tablet (40 mg total) by mouth 2 (two) times daily. 09/13/22  Yes Gelene Mink, NP   spironolactone (ALDACTONE) 100 MG tablet Take 0.5 tablets (50 mg total) by mouth daily. Patient taking differently: Take 50-100 mg by mouth daily. 09/02/22  Yes Dolores Frame, MD  thiamine (VITAMIN B-1) 100 MG tablet Take 1 tablet (100 mg total) by mouth daily. 07/24/22  Yes Emokpae, Courage, MD  albuterol (PROVENTIL) (2.5 MG/3ML) 0.083% nebulizer solution Take 3 mLs (2.5 mg total) by nebulization every 2 (two) hours as needed for wheezing. Patient not taking: Reported on 02/11/2023 07/23/22   Shon Hale, MD  XIFAXAN 550 MG TABS tablet Take 550 mg by mouth 2 (two) times daily.    [provider]    Current Facility-Administered Medications  Medication Dose Route Frequency Provider Last Rate Last Admin   acetaminophen (TYLENOL) tablet 650 mg  650 mg Oral Q6H PRN Vassie Loll, MD   650 mg at 02/12/23 0981   Or   acetaminophen (TYLENOL) suppository 650 mg  650 mg Rectal Q6H PRN Vassie Loll, MD       albuterol (PROVENTIL) (2.5 MG/3ML) 0.083% nebulizer solution 2.5  mg  2.5 mg Nebulization Q2H PRN Vassie Loll, MD   2.5 mg at 02/12/23 1823   Chlorhexidine Gluconate Cloth 2 % PADS 6 each  6 each Topical Daily Vassie Loll, MD   6 each at 02/13/23 0917   enoxaparin (LOVENOX) injection 30 mg  30 mg Subcutaneous Q24H Vassie Loll, MD   30 mg at 02/12/23 2135   famotidine (PEPCID) tablet 20 mg  20 mg Oral QHS Vassie Loll, MD   20 mg at 02/12/23 2131   folic acid (FOLVITE) tablet 1 mg  1 mg Oral Daily Vassie Loll, MD   1 mg at 02/13/23 0900   hydrOXYzine (ATARAX) tablet 25 mg  25 mg Oral Q8H PRN Vassie Loll, MD   25 mg at 02/12/23 1252   lactulose (CHRONULAC) 10 GM/15ML solution 10 g  10 g Oral TID Vassie Loll, MD   10 g at 02/13/23 0900   levothyroxine (SYNTHROID) tablet 88 mcg  88 mcg Oral QAC breakfast Vassie Loll, MD   88 mcg at 02/13/23 4403   linaclotide (LINZESS) capsule 145 mcg  145 mcg Oral QAC breakfast Vassie Loll, MD   145 mcg at 02/13/23  0900   lip balm (CARMEX) ointment 1 Application  1 Application Topical PRN Vassie Loll, MD       midodrine (PROAMATINE) tablet 2.5 mg  2.5 mg Oral BID WC Vassie Loll, MD   2.5 mg at 02/13/23 0900   midodrine (PROAMATINE) tablet 5 mg  5 mg Oral Daily PRN Vassie Loll, MD   5 mg at 02/12/23 1252   multivitamin with minerals tablet 1 tablet  1 tablet Oral Daily Vassie Loll, MD   1 tablet at 02/13/23 0901   ondansetron (ZOFRAN) tablet 4 mg  4 mg Oral Q6H PRN Vassie Loll, MD       Or   ondansetron [facility]) injection 4 mg  4 mg Intravenous Q6H PRN Vassie Loll, MD   4 mg at 02/12/23 1252   pantoprazole (PROTONIX) EC tablet 40 mg  40 mg Oral BID Vassie Loll, MD   40 mg at 02/13/23 0901   thiamine (VITAMIN B1) tablet 100 mg  100 mg Oral Daily Vassie Loll, MD   100 mg at 02/13/23 0901    Allergies as of 02/11/2023   (No Known Allergies)    Family History  Problem Relation Age of Onset   Colon cancer Neg Hx    Colon polyps Neg Hx     Social History   Socioeconomic History   Marital status: Widowed    Spouse name: Not on file   Number of children: Not on file   Years of education: Not on file   Highest education level: Not on file  Occupational History   Not on file  Tobacco Use   Smoking status: Former    Packs/day: 0.25    Years: 30.00    Additional pack years: 0.00    Total pack years: 7.50    Types: Cigarettes    Passive exposure: Past   Smokeless tobacco: Never   Tobacco comments:    3-4 cigs a day since age 62.  Vaping Use   Vaping Use: Never used  Substance and Sexual Activity   Alcohol use: Not Currently    Comment: used to beer nightly not sure of how many   Drug use: No   Sexual activity: Yes    Birth control/protection: Surgical  Other Topics Concern   Not on file  Social History Narrative  Not on file   Social Determinants of Health   Financial Resource Strain: Not on file  Food Insecurity: No Food Insecurity (02/11/2023)   Hunger  Vital Sign    Worried About Running Out of Food in the Last Year: Never true    Ran Out of Food in the Last Year: Never true  Transportation Needs: No Transportation Needs (02/11/2023)   PRAPARE - Administrator, Civil Service (Medical): No    Lack of Transportation (Non-Medical): No  Physical Activity: Not on file  Stress: Not on file  Social Connections: Not on file  Intimate Partner Violence: Not At Risk (02/11/2023)   Humiliation, Afraid, Rape, and Kick questionnaire    Fear of Current or Ex-Partner: No    Emotionally Abused: No    Physically Abused: No    Sexually Abused: No    Review of Systems: Limited obtained by patient and daughter Physical Exam: Vital signs in last 24 hours: Temp:  [97.6 F (36.4 C)-99.1 F (37.3 C)] 99.1 F (37.3 C) (07/07 1107) Pulse Rate:  [70-87] 87 (07/07 1107) Resp:  [13-14] 14 (07/07 0646) BP: (93-118)/(48-75) 93/48 (07/07 0646) SpO2:  [100 %] 100 % (07/07 0646) Last BM Date : 02/11/23 General:   Awake and conversant.  Slow to respond.  Disoriented to place.  She has gross asterixis. Lungs:  Clear throughout to auscultation.   No wheezes, crackles, or rhonchi. No acute distress. Heart:  Regular rate and rhythm; no murmurs, clicks, rubs,  or gallops. Abdomen: Full.  Positive bowel sounds soft and nontender.  No obvious fluid wave.   Extremities: No LE edema. Intake/Output from previous day: 07/06 0701 - 07/07 0700 In: 1250.6 [P.O.:840; I.V.:382.3; IV Piggyback:28.4] Out: 500 [Urine:500] Intake/Output this shift: Total I/O In: 360 [P.O.:360] Out: -   Lab Results: Recent Labs    02/11/23 1146 02/12/23 0517  WBC 9.5 7.6  HGB 11.4* 9.8*  HCT 33.7* 27.9*  PLT 75* 68*   BMET Recent Labs    02/11/23 1146 02/12/23 0333 02/13/23 0437  NA 133* 133* 133*  K 4.8 4.0 4.0  CL 103 106 107  CO2 19* 18* 19*  GLUCOSE 88 98 91  BUN 77* 71* 53*  CREATININE 2.56* 1.95* 1.42*  CALCIUM 9.4 8.7* 8.9   LFT Recent Labs     02/13/23 0437  PROT 5.2*  ALBUMIN 3.0*  AST 31  ALT 25  ALKPHOS 49  BILITOT 6.3*   PT/INR Recent Labs    02/11/23 1202  LABPROT 23.8*  INR 2.1*   Hepatitis Panel No results for input(s): "HEPBSAG", "HCVAB", "HEPAIGM", "HEPBIGM" in the last 72 hours. C-Diff No results for input(s): "CDIFFTOX" in the last 72 hours.  Studies/Results: CT Head Wo Contrast  Result Date: 02/11/2023 CLINICAL DATA:  Fall.  Altered mental status. EXAM: CT HEAD WITHOUT CONTRAST TECHNIQUE: Contiguous axial images were obtained from the base of the skull through the vertex without intravenous contrast. RADIATION DOSE REDUCTION: This exam was performed according to the departmental dose-optimization program which includes automated exposure control, adjustment of the mA and/or kV according to patient size and/or use of iterative reconstruction technique. COMPARISON:  None Available. FINDINGS: Brain: There is no evidence for acute hemorrhage, hydrocephalus, mass lesion, or abnormal extra-axial fluid collection. No definite CT evidence for acute infarction. Patchy low attenuation in the deep hemispheric and periventricular white matter is nonspecific, but likely reflects chronic microvascular ischemic demyelination. Vascular: No hyperdense vessel or unexpected calcification. Skull: No evidence for fracture. No  worrisome lytic or sclerotic lesion. Sinuses/Orbits: Left mastoid effusion. Visualized portions of the globes and intraorbital fat are unremarkable. Other: None. IMPRESSION: 1. No acute intracranial abnormality. 2. Patchy low attenuation in the deep hemispheric and periventricular white matter is nonspecific, but likely reflects chronic microvascular ischemic demyelination. 3. Left mastoid effusion. Electronically Signed   By: Kennith Center M.D.   On: 02/11/2023 13:06    Impression: This lady has end-stage liver disease with a history of recurrent hepatic encephalopathy and ascites admitted to the hospital with  worsening encephalopathy and acute kidney disease. With gentle hydration her renal function has dramatically improved and she has become more lucid but still grossly encephalopathic. I am suspicious that the patient has become volume contracted at home with diuretic therapy and an aggressive paracentesis regimen.  Volume contraction/dehydration precipitated hepatic encephalopathy further exacerbated by diminished oral intake and significant concern for a medication "misadventure" during this episode.  No evident evidence of GI bleeding.  Paracentesis fluid analysis is negative for SBP on 6/25. It is reassuring that her renal function has dramatically improved with holding diuretics and gentle hydration.  Worse not consistent with hepatorenal syndrome at this time. Patient is near listing for liver transplant through [facility]. She continues to drive!  Recommendations:  -Continue lactulose  -Add Xifaxan which was recommended by [facility] but not yet started  -Another 12 hours of gentle hydration with LR at 75 cc an hour  -Repeat labs in the a.m.; repeat patient weight-discussed with nursing staff (admission weight 61.2 kg)  -This patient should absolutely not drive, operate a stove or any other activity where intact mental acuity is required (discussed with daughter)  -Reassess tomorrow morning; update [facility] as appropriate.  -She may need a higher level of care upon discharge.       Notice:  This dictation was prepared with Dragon dictation along with smaller phrase technology. Any transcriptional errors that result from this process are unintentional and may not be corrected upon review.

## 2023-02-13 NOTE — Progress Notes (Signed)
Progress Note   Patient: Karla Young NFA:[id number] DOB: 12-19-1960 DOA: 02/11/2023     2 DOS: the patient was seen and examined on 02/13/2023   Brief hospital admission narrative : As per H&P written by Dr. Ella Jubilee on 02/11/2023 Karla Young is a 62 y.o. female with medical history significant of cirrhosis, COPD, hypotension, hypothyroid, hepatitis C, anxiety and fibromyalgia who presented with altered mental status.  Patient currently is in a waiting list for liver transplant, she is getting outpatient paracentesis every week per GI recommendations. Last paracentesis 06.25.24 removing 2,7 L.  Yesterday afternoon patient was noted to be somnolent and less reactive. This morning when her family went to pick her up to bring her for outpatient paracentesis she was noted to be poorly responsive, very weak and deconditioned. Apparently she had falls at home that were unwitnessed.  Because of her severe change in mentation, EMS was called and she was transported to the ED.   Per her daughter, who is providing the history, it is possible that patient was not taking her medications or having adequate po intake over last 24 hrs.  Patient is not able to give any information due to somnolence.   Assessment and Plan: * Acute metabolic encephalopathy -Likely multifactorial including dehydration, uremia from AKI and elevated ammonia level. -Continue the use of lactulose -Continue to maintain adequate hydration -Continue to follow response to her renal function -Albumin and midodrine will be provided -GI service contacted with concerns of hepatorenal syndrome development.  AKI (acute kidney injury) (HCC) -Most likely prerenal; but there is concerns for hepatorenal syndrome -Continue with gentle fluid resuscitation -Stop the use of diuretics -Midodrine and albumin will be provided -Follow renal function trend.   -Patient denies dysuria and hematuria.  Cirrhosis of liver with ascites (HCC) -Her liver  failure is advanced.  -Actively following with [facility] list in [place] -Continue PPI and lactulose -Following GI service recommendations patient will be also started on xifaxan -GI service consultation is appreciated; will follow any further input/recommendations. -Holding diuretics and paracentesis in the setting of acute kidney injury.   COPD (chronic obstructive pulmonary disease) (HCC) -No signs of exacerbation.  -Continue as needed bronchodilator management.  Hypothyroidism -TSH within normal limit -Continue Synthroid.  HTN (hypertension) -Stable/soft -Low-dose midodrine will be started with intention to keep patient systolic blood pressure above 469 and guarantee MAP above 65 at all times.  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     Subjective:  Low-grade temp appreciated; no chest pain, no nausea, no vomiting, no dysuria or hematuria.  Per family member at bedside patient mentation is still not back to baseline.  Proceeded to be very somnolent and intermittently confused.  Physical Exam: Vitals:   02/13/23 0646 02/13/23 1000 02/13/23 1107 02/13/23 1402  BP: (!) 93/48  112/80 (!) 106/59  Pulse: 70  87 76  Resp: 14  18 16   Temp: 97.6 F (36.4 C) 98.6 F (37 C) 99.1 F (37.3 C) 99 F (37.2 C)  TempSrc: Oral Oral Oral Oral  SpO2: 100%  100% 100%  Weight:      Height:       General exam: Still somnolent and intermittently confused; mentation is back to baseline according to daughter at bedside.  Low-grade temperature appreciated. Respiratory system: Clear to auscultation. Respiratory effort normal.  Good saturation on room air. Cardiovascular system:RRR. No rubs or gallops; no JVD. Gastrointestinal system: Abdomen is distended, nontender  to palpation, positive bowel sounds appreciated.  Positive ascites.  Central nervous system: No focal neurological  deficits. Extremities: No cyanosis or clubbing. Skin: No petechiae. Psychiatry: Judgement and insight appear impaired secondary to acute encephalopathic process.  Data Reviewed: Comprehensive metabolic panel: Sodium 133, potassium 4.0, chloride 107, bicarb 19, BUN 53, creatinine 1.42, total protein 5.2, albumin 3.0, alkaline phosphatase 49, ALT 25, AST 31, total bilirubin 6.3 and GFR 42.  Family Communication: Daughter updated at bedside.  Disposition: Status is: Inpatient Remains inpatient appropriate because: Continue acute IV therapy and management for acute kidney injury and concerns for hepatorenal syndrome.   Planned Discharge Destination: Home  Time spent: 40 minutes  Author: Vassie Loll, MD 02/13/2023 5:08 PM  For on call review [email or web address].

## 2023-02-14 ENCOUNTER — Inpatient Hospital Stay (HOSPITAL_COMMUNITY): Payer: 59

## 2023-02-14 DIAGNOSIS — D696 Thrombocytopenia, unspecified: Secondary | ICD-10-CM | POA: Diagnosis not present

## 2023-02-14 DIAGNOSIS — K652 Spontaneous bacterial peritonitis: Secondary | ICD-10-CM | POA: Diagnosis not present

## 2023-02-14 DIAGNOSIS — I10 Essential (primary) hypertension: Secondary | ICD-10-CM | POA: Diagnosis not present

## 2023-02-14 DIAGNOSIS — N179 Acute kidney failure, unspecified: Secondary | ICD-10-CM | POA: Diagnosis not present

## 2023-02-14 DIAGNOSIS — G9341 Metabolic encephalopathy: Secondary | ICD-10-CM | POA: Diagnosis not present

## 2023-02-14 DIAGNOSIS — K7682 Hepatic encephalopathy: Secondary | ICD-10-CM | POA: Diagnosis not present

## 2023-02-14 DIAGNOSIS — K7031 Alcoholic cirrhosis of liver with ascites: Secondary | ICD-10-CM | POA: Diagnosis not present

## 2023-02-14 LAB — CBC WITH DIFFERENTIAL/PLATELET
Abs Immature Granulocytes: 0.3 10*3/uL — ABNORMAL HIGH (ref 0.00–0.07)
Basophils Absolute: 0.1 10*3/uL (ref 0.0–0.1)
Basophils Relative: 1 %
Eosinophils Absolute: 0.4 10*3/uL (ref 0.0–0.5)
Eosinophils Relative: 3 %
HCT: 29.9 % — ABNORMAL LOW (ref 36.0–46.0)
Hemoglobin: 10.2 g/dL — ABNORMAL LOW (ref 12.0–15.0)
Immature Granulocytes: 2 %
Lymphocytes Relative: 9 %
Lymphs Abs: 1.1 10*3/uL (ref 0.7–4.0)
MCH: 30.1 pg (ref 26.0–34.0)
MCHC: 34.1 g/dL (ref 30.0–36.0)
MCV: 88.2 fL (ref 80.0–100.0)
Monocytes Absolute: 1.6 10*3/uL — ABNORMAL HIGH (ref 0.1–1.0)
Monocytes Relative: 13 %
Neutro Abs: 8.9 10*3/uL — ABNORMAL HIGH (ref 1.7–7.7)
Neutrophils Relative %: 72 %
Platelets: 77 10*3/uL — ABNORMAL LOW (ref 150–400)
RBC: 3.39 MIL/uL — ABNORMAL LOW (ref 3.87–5.11)
RDW: 17.8 % — ABNORMAL HIGH (ref 11.5–15.5)
WBC: 12.4 10*3/uL — ABNORMAL HIGH (ref 4.0–10.5)
nRBC: 0 % (ref 0.0–0.2)

## 2023-02-14 LAB — PROTIME-INR
INR: 2 — ABNORMAL HIGH (ref 0.8–1.2)
Prothrombin Time: 22.8 s — ABNORMAL HIGH (ref 11.4–15.2)

## 2023-02-14 LAB — COMPREHENSIVE METABOLIC PANEL WITH GFR
ALT: 26 U/L (ref 0–44)
AST: 34 U/L (ref 15–41)
Albumin: 3.4 g/dL — ABNORMAL LOW (ref 3.5–5.0)
Alkaline Phosphatase: 59 U/L (ref 38–126)
Anion gap: 7 (ref 5–15)
BUN: 38 mg/dL — ABNORMAL HIGH (ref 8–23)
CO2: 19 mmol/L — ABNORMAL LOW (ref 22–32)
Calcium: 9.3 mg/dL (ref 8.9–10.3)
Chloride: 105 mmol/L (ref 98–111)
Creatinine, Ser: 1.06 mg/dL — ABNORMAL HIGH (ref 0.44–1.00)
GFR, Estimated: 60 mL/min — ABNORMAL LOW
Glucose, Bld: 103 mg/dL — ABNORMAL HIGH (ref 70–99)
Potassium: 3.6 mmol/L (ref 3.5–5.1)
Sodium: 131 mmol/L — ABNORMAL LOW (ref 135–145)
Total Bilirubin: 7.9 mg/dL — ABNORMAL HIGH (ref 0.3–1.2)
Total Protein: 5.9 g/dL — ABNORMAL LOW (ref 6.5–8.1)

## 2023-02-14 LAB — BODY FLUID CELL COUNT WITH DIFFERENTIAL
Eos, Fluid: 0 %
Lymphs, Fluid: 9 %
Monocyte-Macrophage-Serous Fluid: 11 % — ABNORMAL LOW (ref 50–90)
Neutrophil Count, Fluid: 80 % — ABNORMAL HIGH (ref 0–25)
Total Nucleated Cell Count, Fluid: 4725 cu mm — ABNORMAL HIGH (ref 0–1000)

## 2023-02-14 LAB — GRAM STAIN: Gram Stain: NONE SEEN

## 2023-02-14 MED ORDER — ALBUMIN HUMAN 25 % IV SOLN
INTRAVENOUS | Status: AC
  Filled 2023-02-14: qty 50

## 2023-02-14 MED ORDER — ALBUMIN HUMAN 25 % IV SOLN
25.0000 g | INTRAVENOUS | Status: AC
  Administered 2023-02-14: 25 g via INTRAVENOUS

## 2023-02-14 MED ORDER — SODIUM CHLORIDE 0.9 % IV SOLN
2.0000 g | INTRAVENOUS | Status: DC
  Administered 2023-02-14 – 2023-02-16 (×3): 2 g via INTRAVENOUS
  Filled 2023-02-14 (×3): qty 20

## 2023-02-14 NOTE — Progress Notes (Signed)
Patient off of unit to get paracentesis.

## 2023-02-14 NOTE — Progress Notes (Addendum)
Gastroenterology Progress Note    Primary Care Physician:  Wilmon Pali, FNP Primary Gastroenterologist:  Dr. Levon Hedger   Patient ID: Karla Young; [id number]; Sep 03, 1960    Subjective   Lasix 20 mg and spironolactone 50 mg daily as outpatient. States she followed a low sodium diet. Did not always take lactulose as recommended. States it is difficult to have results with lactulose despite taking many doses. No alcohol since thanksgiving. Appt upcoming July 12th. . No overt GI bleeding. Feels much improved from admission. Alert and oriented X 4. She feels very tense in abdomen. Difficult to take in a deep breath. Last para 6/25 with 2.7 liters removed. Not a TIPS candidate due to increased MELD.    Objective   Vital signs in last 24 hours Temp:  [98.4 F (36.9 C)-99 F (37.2 C)] 98.6 F (37 C) (07/08 0435) Pulse Rate:  [72-88] 88 (07/08 0435) Resp:  [16-18] 18 (07/08 0435) BP: (104-113)/(56-64) 113/64 (07/08 0435) SpO2:  [98 %-100 %] 99 % (07/08 1048) Weight:  [66.4 kg] 66.4 kg (07/08 0500) Last BM Date : 02/13/23  Physical Exam General:   Alert and oriented, pleasant, sallow-appearing Head:  Normocephalic and atraumatic. Eyes: scleral icterus Mouth:  Without lesions, mucosa pink and moist.  Abdomen:  Bowel sounds present, tight, distended, TTP Extremities:  Without edema. Neurologic:  Alert and  oriented x4; negative asterixis  Psych:  Alert and cooperative. Normal mood and affect.  Intake/Output from previous day: 07/07 0701 - 07/08 0700 In: 840 [P.O.:840] Out: -  Intake/Output this shift: No intake/output data recorded.  Lab Results  Recent Labs    02/11/23 1146 02/12/23 0517 02/14/23 1010  WBC 9.5 7.6 12.4*  HGB 11.4* 9.8* 10.2*  HCT 33.7* 27.9* 29.9*  PLT 75* 68* 77*   BMET Recent Labs    02/12/23 0333 02/13/23 0437 02/14/23 1010  NA 133* 133* 131*  K 4.0 4.0 3.6  CL 106 107 105  CO2 18* 19* 19*  GLUCOSE 98 91 103*  BUN 71* 53* 38*   CREATININE 1.95* 1.42* 1.06*  CALCIUM 8.7* 8.9 9.3   LFT Recent Labs    02/11/23 1146 02/13/23 0437 02/14/23 1010  PROT 6.0* 5.2* 5.9*  ALBUMIN 2.9* 3.0* 3.4*  AST 48* 31 34  ALT 35 25 26  ALKPHOS 68 49 59  BILITOT 8.4* 6.3* 7.9*   PT/INR Recent Labs    02/11/23 1202 02/14/23 1010  LABPROT 23.8* 22.8*  INR 2.1* 2.0*      Assessment  62 y.o. 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 Now admitted with worsening encephalopathy and acute renal failure in setting of multiple paras for recurrent ascites. We have been unable to titrate oral diuretic therapy as outpatient without bumps in creatinine (outpatient dosage furosemide 20 mg and spironolactone 50 mg daily).   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 She is nearing her baseline mental status. Receiving lactulose 10g TID and Xifaxan BID. Linzess 145 mcg daily as well due to notable constipation historically. She does admit to not being compliant with oral lactulose at home, sometimes missing doses due to the taste.   Cirrhosis: MELD 3.0 is 26 today. End stage disease.  Notable uptick in need for paras as outpatient. TIPS candidacy assessed earlier this year and was found not appropriate in light of Young MELD. Currently undergoing process for liver transplantation with [facility]  and actually has upcoming appointment 7/12. CT abd/pelvis with and without contrast June 24 through [facility] showed cirrhosis, moderate volume ascites, findings of portal hypertension, and no suspicious hepatic lesions. Jan 2024 US liver doppler negative for PVT. Abdomen is diffusely distended and tense on exam. Last para 6/25. Diuretic therapy remains on hold. Thankfully, renal function is improving with gentle hydration, currently creatinine 1.06 (2.56 on admission).     Plan / Recommendations  Continue  to follow creatinine, gentle hydration Continue to hold diuretics Advance as tolerated to 2 gram sodium diet Continue lactulose and Xifaxan  Due to significantly tense ascites and discomfort, will order [place] para with IV albumin at start and no more than 4 liters at absolute most removed.  AFP tumor marker pending Will update [facility] as well: I have reached out to Annamarie Major, NP I personally called and updated Alphonse Guild, daughter.  I have also reached out to social work about assisting with home health arrangements per daughter's request [facility]).     LOS: 3 days    02/14/2023, 11:09 AM  Gelene Mink, PhD, ANP-BC [facility] Gastroenterology

## 2023-02-14 NOTE — Progress Notes (Signed)
Pt transported to [place] 1 per stretcher, no obvious distress. Procedure explained, consent obtaine. Prepped and draped in steril manner. Albumin started IV. Access obtained at RLQ without difficulty. 4L withdrawn under vacutainer suction. Access removed, hemostasis achieved, dermabond applied, along with bandage. Transported to floor , in no acute distress.

## 2023-02-14 NOTE — Progress Notes (Signed)
   02/14/23 1515  Spiritual Encounters  Type of Visit Initial  Care provided to: Patient  Conversation partners present during encounter Nurse  Referral source Family  Reason for visit Advance directives  OnCall Visit No  Spiritual Framework  Presenting Themes Other (comment)  Community/Connection Other (comment)  Patient Stress Factors None identified (Patient not present)  Family Stress Factors None identified (Patient not present)  Interventions  Spiritual Care Interventions Made Other (comment) (Patient was not present)  Intervention Outcomes  Outcomes Awareness of support  Spiritual Care Plan  Spiritual Care Issues Still Outstanding Chaplain will continue to follow  Advance Directives (For Healthcare)  Does Patient Have a Medical Advance Directive? No  Would patient like information on creating a medical advance directive? Yes (Inpatient - patient defers creating a medical advance directive at this time - Information given)   Referred to bedside by family member during admission that they would like more information about Advanced Directive. When this writer arrived on unit, patient was in a procedure and would not be available for some time. This writer left the Advanced Care packet on patient tray table with her name on it. Will return the following day to provide education if patient/family is amiable. Will continue to follow in order to provide spiritual support and to assess for spiritual need.   Rev. Jolyn Lent, M.Div Chaplain

## 2023-02-14 NOTE — Procedures (Signed)
INDICATION: Ascites EXAM: ULTRASOUND GUIDED RIGHT PARACENTESIS GRIP-IR: Category: Fluids  Subcategory: Paracentesis  Follow-Up: None  MEDICATIONS: None. COMPLICATIONS: None immediate. PROCEDURE: Informed written consent was obtained from the patient after a discussion of the risks, benefits and alternatives to treatment. A timeout was performed prior to the initiation of the procedure.  Initial ultrasound scanning demonstrates a large amount of ascites within the right lower abdominal quadrant.  The right lower abdomen was prepped and draped in the usual sterile fashion. 1% lidocaine  was used for local anesthesia.   Following this, a 19 gauge, 7-cm, Yueh catheter was introduced. An ultrasound image was saved for documentation purposes. The paracentesis was performed. The catheter was removed and a dressing was applied. The patient tolerated the procedure well without immediate post procedural complication.  Patient received post-procedure intravenous albumin; see nursing notes for details. FINDINGS: A total of approximately 4 L of amber fluid was removed. Samples were sent to the laboratory as requested by the clinical team. IMPRESSION:  Successful ultrasound-guided paracentesis yielding 4 liters of peritoneal fluid.

## 2023-02-14 NOTE — Plan of Care (Signed)
  Problem: Education: Goal: Knowledge of General Education information will improve Description: Including pain rating scale, medication(s)/side effects and non-pharmacologic comfort measures Outcome: Progressing   Problem: Health Behavior/Discharge Planning: Goal: Ability to manage health-related needs will improve Outcome: Progressing   Problem: Clinical Measurements: Goal: Ability to maintain clinical measurements within normal limits will improve Outcome: Progressing Goal: Will remain free from infection Outcome: Progressing Goal: Diagnostic test results will improve Outcome: Progressing Goal: Cardiovascular complication will be avoided Outcome: Progressing   Problem: Coping: Goal: Level of anxiety will decrease Outcome: Progressing   Problem: Elimination: Goal: Will not experience complications related to bowel motility Outcome: Progressing Goal: Will not experience complications related to urinary retention Outcome: Progressing   Problem: Pain Managment: Goal: General experience of comfort will improve Outcome: Progressing   Problem: Safety: Goal: Ability to remain free from injury will improve Outcome: Progressing   Problem: Skin Integrity: Goal: Risk for impaired skin integrity will decrease Outcome: Progressing   Problem: Clinical Measurements: Goal: Respiratory complications will improve Outcome: Not Progressing   Problem: Activity: Goal: Risk for activity intolerance will decrease Outcome: Not Progressing   Problem: Nutrition: Goal: Adequate nutrition will be maintained Outcome: Not Progressing   

## 2023-02-14 NOTE — Progress Notes (Signed)
I spoke with daughter, Alphonse Guild, and informed of SBP diagnosis. Rocephin 2 g IV has been ordered for every 24 hours. Patient has received weight-based dosing for IV albumin. She will need 1g/kg of IV Albumin on 7/10. I also spoke to Annamarie Major, NP with [facility]. No need to transfer patient currently. Continue supportive care. If patient were to decline, we would reach out again.   Gelene Mink, PhD, ANP-BC [facility] Gastroenterology

## 2023-02-14 NOTE — Progress Notes (Signed)
Progress Note   Patient: Karla Young ZOX:[id number] DOB: 1961-04-05 DOA: 02/11/2023     3 DOS: the patient was seen and examined on 02/14/2023   Brief hospital admission narrative : As per H&P written by Dr. Ella Jubilee on 02/11/2023 Karla Young is a 62 y.o. female with medical history significant of cirrhosis, COPD, hypotension, hypothyroid, hepatitis C, anxiety and fibromyalgia who presented with altered mental status.  Patient currently is in a waiting list for liver transplant, she is getting outpatient paracentesis every week per GI recommendations. Last paracentesis 06.25.24 removing 2,7 L.  Yesterday afternoon patient was noted to be somnolent and less reactive. This morning when her family went to pick her up to bring her for outpatient paracentesis she was noted to be poorly responsive, very weak and deconditioned. Apparently she had falls at home that were unwitnessed.  Because of her severe change in mentation, EMS was called and she was transported to the ED.   Per her daughter, who is providing the history, it is possible that patient was not taking her medications or having adequate po intake over last 24 hrs.  Patient is not able to give any information due to somnolence.   Assessment and Plan: * Hepatic encephalopathy (HCC) -Likely multifactorial including dehydration, uremia from AKI and elevated ammonia level. -Continue the use of lactulose -Continue to maintain adequate hydration -Continue to follow response to her renal function -Albumin and midodrine will be provided -GI service contacted with concerns of hepatorenal syndrome development.  AKI (acute kidney injury) (HCC) -Most likely prerenal; but there is concerns for hepatorenal syndrome -Continue to hold the use of diuretics -Midodrine and albumin provided -Continue to follow renal function trend.   -Patient denies dysuria and hematuria. -Repeat urinalysis not demonstrating acute UTI -Creatinine down to 1.06. -Hold on  further IV fluids.  Cirrhosis of liver with ascites (HCC) -Her liver failure is advanced.  -Actively following with [facility] transplant list in [place] -Continue PPI and lactulose -Following GI service recommendations patient will be also started on xifaxan -GI service consultation is appreciated; will follow any further input/recommendations. -Continue to hold diuretics; following GI recommendation after improvement in renal function, paracentesis provided a result suggesting the presence of SBP -Rocephin started.   COPD (chronic obstructive pulmonary disease) (HCC) -No signs of exacerbation.  -Continue as needed bronchodilator management.  Hypothyroidism -TSH within normal limit -Continue Synthroid.  HTN (hypertension) -Stable/soft -Low-dose midodrine will be started with intention to keep patient systolic blood pressure above 811 and guarantee MAP above 65 at all times.  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     Subjective:  Afebrile, no chest pain, no nausea or vomiting.  Intermittently confused and is still somnolent overall more clear.  Reporting difficulty taking deep breath secondary to ascites and also having mild abdominal pain.  Physical Exam: Vitals:   02/14/23 1037 02/14/23 1048 02/14/23 1334 02/14/23 1456  BP:   (!) 110/59 (!) 111/59  Pulse:    80  Resp:      Temp:    98.3 F (36.8 C)  TempSrc:    Oral  SpO2: 100% 99%  100%  Weight:      Height:       General exam: Alert, awake, oriented x 2; afebrile, no chest pain, no nausea vomiting. Respiratory system: Reporting difficulties taking full breath secondary to ascites; good saturation on room air, no tachypnea, no requiring oxygen supplementation and no having  any wheezing. Cardiovascular system:RRR. No rubs or gallops Gastrointestinal system: Abdomen is distended, positive bowel sounds; reporting mild tenderness  and positive ascites appreciated. Central nervous system: No focal neurological deficits. Extremities: No cyanosis or clubbing. Skin: No petechiae. Psychiatry: Mood & affect appropriate.   Data Reviewed: CBC: White blood cell 12.4, hemoglobin 10.2 and platelet count 77 K Comprehensive metabolic panel: Sodium 131, potassium 3.6, chloride 105, bicarb 19, BUN 38, creatinine 1.06, normal LFTs and total bilirubin 7.9; GFR 60. Body fluid Calphron paracentesis demonstrating total cell count of 4725 with a neutrophil count of 80.  Family Communication: Daughter updated at bedside.  Disposition: Status is: Inpatient Remains inpatient appropriate because: Continue acute IV therapy and management for acute kidney injury and concerns for SBP.   Planned Discharge Destination: Home  Time spent: 40 minutes  Author: Vassie Loll, MD 02/14/2023 4:09 PM  For on call review [email or web address].

## 2023-02-15 ENCOUNTER — Ambulatory Visit: Payer: 59 | Admitting: Gastroenterology

## 2023-02-15 ENCOUNTER — Encounter: Payer: Self-pay | Admitting: Gastroenterology

## 2023-02-15 DIAGNOSIS — K7682 Hepatic encephalopathy: Secondary | ICD-10-CM | POA: Diagnosis not present

## 2023-02-15 DIAGNOSIS — K652 Spontaneous bacterial peritonitis: Secondary | ICD-10-CM | POA: Diagnosis not present

## 2023-02-15 DIAGNOSIS — D696 Thrombocytopenia, unspecified: Secondary | ICD-10-CM | POA: Diagnosis not present

## 2023-02-15 DIAGNOSIS — N179 Acute kidney failure, unspecified: Secondary | ICD-10-CM | POA: Diagnosis not present

## 2023-02-15 DIAGNOSIS — K7031 Alcoholic cirrhosis of liver with ascites: Secondary | ICD-10-CM | POA: Diagnosis not present

## 2023-02-15 DIAGNOSIS — I10 Essential (primary) hypertension: Secondary | ICD-10-CM | POA: Diagnosis not present

## 2023-02-15 DIAGNOSIS — G9341 Metabolic encephalopathy: Secondary | ICD-10-CM | POA: Diagnosis not present

## 2023-02-15 LAB — CBC
HCT: 28.2 % — ABNORMAL LOW (ref 36.0–46.0)
Hemoglobin: 9.8 g/dL — ABNORMAL LOW (ref 12.0–15.0)
MCH: 30.7 pg (ref 26.0–34.0)
MCHC: 34.8 g/dL (ref 30.0–36.0)
MCV: 88.4 fL (ref 80.0–100.0)
Platelets: 68 10*3/uL — ABNORMAL LOW (ref 150–400)
RBC: 3.19 MIL/uL — ABNORMAL LOW (ref 3.87–5.11)
RDW: 18 % — ABNORMAL HIGH (ref 11.5–15.5)
WBC: 10.2 10*3/uL (ref 4.0–10.5)
nRBC: 0 % (ref 0.0–0.2)

## 2023-02-15 LAB — COMPREHENSIVE METABOLIC PANEL
ALT: 25 U/L (ref 0–44)
AST: 38 U/L (ref 15–41)
Albumin: 3.2 g/dL — ABNORMAL LOW (ref 3.5–5.0)
Alkaline Phosphatase: 53 U/L (ref 38–126)
Anion gap: 8 (ref 5–15)
BUN: 32 mg/dL — ABNORMAL HIGH (ref 8–23)
CO2: 19 mmol/L — ABNORMAL LOW (ref 22–32)
Calcium: 9 mg/dL (ref 8.9–10.3)
Chloride: 105 mmol/L (ref 98–111)
Creatinine, Ser: 0.83 mg/dL (ref 0.44–1.00)
GFR, Estimated: >60 mL/min (ref >60)
Glucose, Bld: 90 mg/dL (ref 70–99)
Potassium: 4.1 mmol/L (ref 3.5–5.1)
Sodium: 132 mmol/L — ABNORMAL LOW (ref 135–145)
Total Bilirubin: 6.5 mg/dL — ABNORMAL HIGH (ref 0.3–1.2)
Total Protein: 5.7 g/dL — ABNORMAL LOW (ref 6.5–8.1)

## 2023-02-15 LAB — PROTIME-INR
INR: 1.7 — ABNORMAL HIGH (ref 0.8–1.2)
Prothrombin Time: 20.5 seconds — ABNORMAL HIGH (ref 11.4–15.2)

## 2023-02-15 LAB — AFP TUMOR MARKER: AFP, Serum, Tumor Marker: 2.3 ng/mL (ref 0.0–9.2)

## 2023-02-15 MED ORDER — ENOXAPARIN SODIUM 40 MG/0.4ML IJ SOSY
40.0000 mg | PREFILLED_SYRINGE | INTRAMUSCULAR | Status: DC
  Administered 2023-02-15 – 2023-02-20 (×6): 40 mg via SUBCUTANEOUS
  Filled 2023-02-15 (×6): qty 0.4

## 2023-02-15 MED ORDER — SPIRONOLACTONE 25 MG PO TABS
50.0000 mg | ORAL_TABLET | Freq: Every day | ORAL | Status: DC
  Administered 2023-02-15 – 2023-02-20 (×6): 50 mg via ORAL
  Filled 2023-02-15 (×6): qty 2

## 2023-02-15 MED ORDER — FUROSEMIDE 20 MG PO TABS
20.0000 mg | ORAL_TABLET | Freq: Every day | ORAL | Status: DC
  Administered 2023-02-15 – 2023-02-20 (×6): 20 mg via ORAL
  Filled 2023-02-15 (×6): qty 1

## 2023-02-15 MED ORDER — LACTULOSE 10 GM/15ML PO SOLN
20.0000 g | Freq: Three times a day (TID) | ORAL | Status: DC
  Administered 2023-02-15 – 2023-02-16 (×3): 20 g via ORAL
  Filled 2023-02-15 (×3): qty 30

## 2023-02-15 NOTE — Progress Notes (Addendum)
Subjective: Reports she is tired today.  Denies any confusion/difficulty thinking.  Reports mild abdominal discomfort related to abdominal distention, but no real pain.  Overall, her abdomen feels better since paracentesis.  No longer tense.  Denies nausea, vomiting, BRBPR, melena.  She reports only having 1 bowel movement yesterday.  Objective: Vital signs in last 24 hours: Temp:  [98.3 F (36.8 C)-98.8 F (37.1 C)] 98.6 F (37 C) (07/09 1610) Pulse Rate:  [80-92] 81 (07/09 0822) BP: (94-111)/(50-62) 101/57 (07/09 0822) SpO2:  [99 %-100 %] 100 % (07/09 0822) Weight:  [62.4 kg] 62.4 kg (07/09 0100) Last BM Date : 02/13/23 General:   Alert and oriented, tired, closing eyes frequently during ,y visit with her today.  Head:  Normocephalic and atraumatic. Eyes:  + scleral icterus.  Abdomen:  Bowel sounds present, distended, but soft and non-tender. Extremities:  Without edema. Neurologic:  Alert and  oriented x4. + Asterixis. Skin:  Warm and dry, intact without significant lesions.  Psych:  Normal mood and affect.  Intake/Output from previous day: 07/08 0701 - 07/09 0700 In: 101 [IV Piggyback:101] Out: -  Intake/Output this shift: Total I/O In: 240 [P.O.:240] Out: -   Lab Results: Recent Labs    02/14/23 1010 02/15/23 0845  WBC 12.4* 10.2  HGB 10.2* 9.8*  HCT 29.9* 28.2*  PLT 77* 68*   BMET Recent Labs    02/13/23 0437 02/14/23 1010 02/15/23 0845  NA 133* 131* 132*  K 4.0 3.6 4.1  CL 107 105 105  CO2 19* 19* 19*  GLUCOSE 91 103* 90  BUN 53* 38* 32*  CREATININE 1.42* 1.06* 0.83  CALCIUM 8.9 9.3 9.0   LFT Recent Labs    02/13/23 0437 02/14/23 1010 02/15/23 0845  PROT 5.2* 5.9* 5.7*  ALBUMIN 3.0* 3.4* 3.2*  AST 31 34 38  ALT 25 26 25   ALKPHOS 49 59 53  BILITOT 6.3* 7.9* 6.5*   PT/INR Recent Labs    02/14/23 1010 02/15/23 0845  LABPROT 22.8* 20.5*  INR 2.0* 1.7*    Studies/Results: US Paracentesis  Result Date: 02/14/2023 INDICATION:  Ascites EXAM: ULTRASOUND GUIDED RIGHT PARACENTESIS MEDICATIONS: None. COMPLICATIONS: None immediate. PROCEDURE: Informed written consent was obtained from the patient after a discussion of the risks, benefits and alternatives to treatment. A timeout was performed prior to the initiation of the procedure. Initial ultrasound scanning demonstrates a large amount of ascites within the right lower abdominal quadrant. The right lower abdomen was prepped and draped in the usual sterile fashion. 1% lidocaine was used for local anesthesia. Following this, a 19 gauge, 7-cm, Yueh catheter was introduced. An ultrasound image was saved for documentation purposes. The paracentesis was performed. The catheter was removed and a dressing was applied. The patient tolerated the procedure well without immediate post procedural complication. Patient received post-procedure intravenous albumin; see nursing notes for details. FINDINGS: A total of approximately 4 L of amber fluid was removed. Samples were sent to the laboratory as requested by the clinical team. IMPRESSION: Successful ultrasound-guided paracentesis yielding 4 liters of peritoneal fluid. Electronically Signed   By: Gaylyn Rong M.D.   On: 02/14/2023 14:25    Assessment: 62 y.o. 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 Now admitted with worsening encephalopathy and acute renal failure in setting of multiple paras for recurrent ascites.   Hepatic encephalopathy: Was improving, but  she is more tired and has slight asterixis on exam today though still A&O x 4.  Suspect this is related to SBP diagnosed yesterday and inadequate lactulose dosing as she only had 1 BM yesterday. She is receiving lactulose 10 g TID and Xifaxan twice daily. She does have chronic constipation and is also taking Linzess 145 mcg daily.  Will increase lactulose to 20 g TID.   Cirrhosis:  MELD 3.0 is 23 today.  Decompensated/end-stage disease complicated by significant ascites requiring frequent paracentesis.  Prior Ultrasound Doppler negative for PVT.  No suspicious hepatic lesions noted on prior CT.  Unfortunately, we have been unable to titrate oral diuretic therapy as outpatient without bumps in her creatinine.  She was on furosemide 20 mg and spironolactone 50 mg daily outpatient, but diuretic therapy has been on hold this admission due to AKI which has now resolved, creatinine 0.83 today (creatinine 2.56 on admission).  Underwent paracentesis 7/8 yielding 4 L (max limit).  PMNs were greater than 250 consistent with SBP, gram-negative rods on Gram stain, no growth on cultures so far. She has been started on rocephin and received albumin yesterday.  TIPS candidacy assessed earlier this year and found not appropriate in light of high MELD.  She is currently undergoing process for liver transplantation with [facility] and is scheduled for an appointment on 7/12.  Lewie Loron, NP spoke with Sudie Bailey, NP with [facility] yesterday.  No need to transfer patient currently.  If patient were to decline, we would reach out again.  She has had no overt GI bleeding.  EGD up-to-date in January 2024 with grade 1 esophageal varices, portal hypertensive gastropathy.  SBP:  Paracentesis 7/8 with PMNs 3780.  Gram-negative rods on Gram stain.  No growth on culture so far.  Started on Rocephin on 7/8 and received IV albumin.  She will need to complete 5 days of Rocephin and weight-based IV albumin on 7/10.   Plan: Increase lactulose to 20 g 3 times daily. Continue Xifaxan 550 mg twice daily. 5-day course of Rocephin 2 g daily Will need weight-based 1 g/kg IV albumin on 7/10. Continue to follow culture.  She will need Ciprofloxacin daily for SBP prophylaxis moving forward.   Consider resuming low-dose diuretics.  Will discuss with Dr. Levon Hedger.  Daily  MELD labs Advance diet as tolerated to 2 g sodium diet. Continue with outpatient evaluation for liver transplant with [facility].      LOS: 4 days    02/15/2023, 10:35 AM   Ermalinda Memos, PA-C [facility] Gastroenterology

## 2023-02-15 NOTE — Progress Notes (Signed)
Met with Karla Young and daughter Karla Young this afternoon. This writer provided education regarding HCPOA/LIVING WILL documents. Patient has only one living biological daughter and no spouse, therefore, daughter Karla Young is the only legal health care proxy by law. Karla Young stated that in the event that she has a condition that is incurable and it is determined that to a high degree of certainty that it will result in her death within a relatively short period of time that she DOES NOT want life prolonging measures taken. Daughter Karla Young heard this and understands her wishes. Patient will not fill out paperwork given the automatic legality of her daughter being Healthcare proxy.   Rev. Kyrel Leighton Tensley Wery, M.Div Chaplain 

## 2023-02-15 NOTE — Progress Notes (Signed)
Progress Note   Patient: Karla Young WUJ:[id number] DOB: 1961/07/08 DOA: 02/11/2023     4 DOS: the patient was seen and examined on 02/15/2023   Brief hospital admission narrative : As per H&P written by Dr. Ella Jubilee on 02/11/2023 Karla Young is a 62 y.o. female with medical history significant of cirrhosis, COPD, hypotension, hypothyroid, hepatitis C, anxiety and fibromyalgia who presented with altered mental status.  Patient currently is in a waiting list for liver transplant, she is getting outpatient paracentesis every week per GI recommendations. Last paracentesis 06.25.24 removing 2,7 L.  Yesterday afternoon patient was noted to be somnolent and less reactive. This morning when her family went to pick her up to bring her for outpatient paracentesis she was noted to be poorly responsive, very weak and deconditioned. Apparently she had falls at home that were unwitnessed.  Because of her severe change in mentation, EMS was called and she was transported to the ED.   Per her daughter, who is providing the history, it is possible that patient was not taking her medications or having adequate po intake over last 24 hrs.  Patient is not able to give any information due to somnolence.   Assessment and Plan: * Hepatic encephalopathy (HCC) -Likely multifactorial including dehydration, uremia from AKI and elevated ammonia level. -Continue the use of lactulose at adjusted dose and xifaxan as recommended by GI service. -Continue to maintain adequate hydration -Continue to follow response and continue constant reorientation. -Patient received treatment with midodrine and albumin; no hepatorenal syndrome presence demonstrated.  AKI (acute kidney injury) (HCC) -Most likely prerenal; hepatorenal presents has been rule out. -Midodrine and albumin provided; currently receiving midodrine to maintain MAP above 65 at all times -Renal function has stabilized and most recent creatinine 0.83 -Continue to follow  renal function trend and maintain adequate hydration..   -Patient denies dysuria and hematuria.   Cirrhosis of liver with ascites (HCC) -Her liver failure is advanced.  -Actively following with [facility] transplant list in [place] -Continue PPI and lactulose -Following GI service recommendations patient will be also started on xifaxan -GI service consultation is appreciated; will follow any further input/recommendations. -Continue to hold diuretics; following GI recommendation after improvement in renal function, paracentesis provided a result suggesting the presence of SBP -Rocephin started.   COPD (chronic obstructive pulmonary disease) (HCC) -No signs of exacerbation.  -Continue as needed bronchodilator management.  Hypothyroidism -TSH within normal limit -Continue Synthroid.  HTN (hypertension) -Stable/soft -Low-dose midodrine will be started with intention to keep patient systolic blood pressure above 956 and guarantee MAP above 65 at all times.  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     Subjective:  No fever, no chest pain, no nausea, no vomiting.  Overall demonstrating improvement in her mentation although still not completely back to baseline.  Reports feeling weak, tired and there was presence of mild asterixis.  Physical Exam: Vitals:   02/14/23 2033 02/15/23 0100 02/15/23 0335 02/15/23 0822  BP: 108/62  (!) 94/50 (!) 101/57  Pulse: 82  92 81  Resp:      Temp: 98.8 F (37.1 C)  98.8 F (37.1 C) 98.6 F (37 C)  TempSrc: Oral  Oral Oral  SpO2: 100%  100% 100%  Weight:  62.4 kg    Height:       General exam: More alert and interactive; reported improvement in her breathing and wellbeing after 4 L removal with paracentesis on  05/17/2023 provided.  No fever, no nausea, no vomiting. Respiratory system: Clear to auscultation. Respiratory effort normal.  Good saturation on  room air. Cardiovascular system:RRR. No rubs or gallops. Gastrointestinal system: Abdomen is distended with positive fluid wave and no guarding; no warm sensation on palpation.  Positive bowel sounds.  Patient expressed mild discomfort with pressure during auscultation. Central nervous system: Alert and oriented. No focal neurological deficits. Extremities: No cyanosis or clubbing; no edema. Skin: No petechiae. Psychiatry: Judgement and insight appear slight impair secondary to encephalopathy.   General exam: Alert, awake, oriented x 2; afebrile, no chest pain, no nausea vomiting. Respiratory system: Reporting difficulties taking full breath secondary to ascites; good saturation on room air, no tachypnea, no requiring oxygen supplementation and no having any wheezing. Cardiovascular system:RRR. No rubs or gallops Gastrointestinal system: Abdomen is distended, positive bowel sounds; reporting mild tenderness and positive ascites appreciated. Central nervous system: No focal neurological deficits. Extremities: No cyanosis or clubbing. Skin: No petechiae. Psychiatry: Mood & affect appropriate.   Data Reviewed: CBC: White blood cell 10.2, hemoglobin 9.8 and platelet count 68 K. Comprehensive metabolic panel: Sodium 132, potassium 4.1, chloride 105, bicarb 19, BUN 32, creatinine 0.83, albumin 3.2, AST 38, ALT 25, alkaline phosphatase 53 and total bilirubin 6.5.  Patient GFR >60 Body fluid count from paracentesis demonstrating total cell count of 4725 with a neutrophil count of 80.  Family Communication: Daughter updated at bedside.  Disposition: Status is: Inpatient Remains inpatient appropriate because: Continue acute IV therapy and management for acute kidney injury and concerns for SBP.   Planned Discharge Destination: Home  Time spent: 40 minutes  Author: Vassie Loll, MD 02/15/2023 1:00 PM  For on call review [email or web address].

## 2023-02-15 NOTE — ACP (Advance Care Planning) (Signed)
Met with [name] and daughter Karla Young this afternoon. This Clinical research associate provided education regarding HCPOA/LIVING WILL documents. Patient has only one living biological daughter and no spouse, therefore, daughter Karla Young is the only legal health care proxy by law. Ms. Chaput stated that in the event that she has a condition that is incurable and it is determined that to a high degree of certainty that it will result in her death within a relatively short period of time that she DOES NOT want life prolonging measures taken. Daughter Karla Young heard this and understands her wishes. Patient will not fill out paperwork given the automatic legality of her daughter being Healthcare proxy.   Rev. Jolyn Lent, M.Div Chaplain

## 2023-02-16 DIAGNOSIS — K652 Spontaneous bacterial peritonitis: Secondary | ICD-10-CM | POA: Diagnosis not present

## 2023-02-16 DIAGNOSIS — K7682 Hepatic encephalopathy: Secondary | ICD-10-CM | POA: Diagnosis not present

## 2023-02-16 DIAGNOSIS — K7031 Alcoholic cirrhosis of liver with ascites: Secondary | ICD-10-CM | POA: Diagnosis not present

## 2023-02-16 LAB — CBC
HCT: 27.7 % — ABNORMAL LOW (ref 36.0–46.0)
Hemoglobin: 9.5 g/dL — ABNORMAL LOW (ref 12.0–15.0)
MCH: 30.7 pg (ref 26.0–34.0)
MCHC: 34.3 g/dL (ref 30.0–36.0)
MCV: 89.6 fL (ref 80.0–100.0)
Platelets: 81 10*3/uL — ABNORMAL LOW (ref 150–400)
RBC: 3.09 MIL/uL — ABNORMAL LOW (ref 3.87–5.11)
RDW: 18.5 % — ABNORMAL HIGH (ref 11.5–15.5)
WBC: 10.8 10*3/uL — ABNORMAL HIGH (ref 4.0–10.5)
nRBC: 0 % (ref 0.0–0.2)

## 2023-02-16 LAB — COMPREHENSIVE METABOLIC PANEL
ALT: 24 U/L (ref 0–44)
AST: 34 U/L (ref 15–41)
Albumin: 2.8 g/dL — ABNORMAL LOW (ref 3.5–5.0)
Alkaline Phosphatase: 52 U/L (ref 38–126)
Anion gap: 5 (ref 5–15)
BUN: 35 mg/dL — ABNORMAL HIGH (ref 8–23)
CO2: 22 mmol/L (ref 22–32)
Calcium: 8.8 mg/dL — ABNORMAL LOW (ref 8.9–10.3)
Chloride: 103 mmol/L (ref 98–111)
Creatinine, Ser: 1.03 mg/dL — ABNORMAL HIGH (ref 0.44–1.00)
GFR, Estimated: >60 mL/min (ref >60)
Glucose, Bld: 104 mg/dL — ABNORMAL HIGH (ref 70–99)
Potassium: 4 mmol/L (ref 3.5–5.1)
Sodium: 130 mmol/L — ABNORMAL LOW (ref 135–145)
Total Bilirubin: 5 mg/dL — ABNORMAL HIGH (ref 0.3–1.2)
Total Protein: 5.3 g/dL — ABNORMAL LOW (ref 6.5–8.1)

## 2023-02-16 LAB — PROTIME-INR
INR: 2.2 — ABNORMAL HIGH (ref 0.8–1.2)
Prothrombin Time: 24.3 seconds — ABNORMAL HIGH (ref 11.4–15.2)

## 2023-02-16 MED ORDER — LACTULOSE 10 GM/15ML PO SOLN
30.0000 g | Freq: Three times a day (TID) | ORAL | Status: DC
  Administered 2023-02-16 – 2023-02-20 (×11): 30 g via ORAL
  Filled 2023-02-16 (×13): qty 60

## 2023-02-16 MED ORDER — ALBUMIN HUMAN 25 % IV SOLN
25.0000 g | Freq: Three times a day (TID) | INTRAVENOUS | Status: AC
  Administered 2023-02-16 (×2): 25 g via INTRAVENOUS
  Filled 2023-02-16 (×2): qty 100

## 2023-02-16 MED ORDER — ALBUMIN HUMAN 25 % IV SOLN
12.5000 g | Freq: Once | INTRAVENOUS | Status: AC
  Administered 2023-02-17: 12.5 g via INTRAVENOUS
  Filled 2023-02-16: qty 50

## 2023-02-16 NOTE — Progress Notes (Signed)
Subjective: Patient alert and oriented this morning x4. She denies abdominal pain. Having some nausea, no vomiting. Thinks maybe a small BM yesterday but none today. Abdomen is still feeling very distended, patient is requesting to have a paracentesis today.   Objective: Vital signs in last 24 hours: Temp:  [98.3 F (36.8 C)-100.4 F (38 C)] 98.3 F (36.8 C) (07/10 0451) Pulse Rate:  [68-85] 68 (07/10 0451) Resp:  [16-20] 16 (07/10 0451) BP: (94-108)/(50-65) 104/60 (07/10 0451) SpO2:  [100 %] 100 % (07/10 0451) Weight:  [64 kg] 64 kg (07/10 0500) Last BM Date : 02/15/23 General:   Alert and oriented, pleasant Head:  Normocephalic and atraumatic. Eyes:  scleral icterus  Mouth:  Without lesions, mucosa pink and moist.  Heart:  S1, S2 present, no murmurs noted.  Lungs: Clear to auscultation bilaterally, without wheezing, rales, or rhonchi.  Abdomen:  Bowel sounds present. Abdomen is quite distended but still fairly soft, not tense. No HSM or hernias noted. No rebound or guarding. No masses appreciated  Msk:  Symmetrical without gross deformities. Normal posture. Pulses:  Normal pulses noted. Extremities:  Without clubbing or edema. Neurologic:  Alert and  oriented x4;  grossly normal neurologically. No asterixis  Skin:  Warm and dry, intact without significant lesions. Jaundice  Psych:  Alert and cooperative. Normal mood and affect.  Intake/Output from previous day: 07/09 0701 - 07/10 0700 In: 960 [P.O.:960] Out: -  Intake/Output this shift: No intake/output data recorded.  Lab Results: Recent Labs    02/14/23 1010 02/15/23 0845 02/16/23 0457  WBC 12.4* 10.2 10.8*  HGB 10.2* 9.8* 9.5*  HCT 29.9* 28.2* 27.7*  PLT 77* 68* 81*   BMET Recent Labs    02/14/23 1010 02/15/23 0845 02/16/23 0457  NA 131* 132* 130*  K 3.6 4.1 4.0  CL 105 105 103  CO2 19* 19* 22  GLUCOSE 103* 90 104*  BUN 38* 32* 35*  CREATININE 1.06* 0.83 1.03*  CALCIUM 9.3 9.0 8.8*   LFT Recent  Labs    02/14/23 1010 02/15/23 0845 02/16/23 0457  PROT 5.9* 5.7* 5.3*  ALBUMIN 3.4* 3.2* 2.8*  AST 34 38 34  ALT 26 25 24   ALKPHOS 59 53 52  BILITOT 7.9* 6.5* 5.0*   PT/INR Recent Labs    02/15/23 0845 02/16/23 0457  LABPROT 20.5* 24.3*  INR 1.7* 2.2*    Studies/Results: US Paracentesis  Result Date: 02/14/2023 INDICATION: Ascites EXAM: ULTRASOUND GUIDED RIGHT PARACENTESIS MEDICATIONS: None. COMPLICATIONS: None immediate. PROCEDURE: Informed written consent was obtained from the patient after a discussion of the risks, benefits and alternatives to treatment. A timeout was performed prior to the initiation of the procedure. Initial ultrasound scanning demonstrates a large amount of ascites within the right lower abdominal quadrant. The right lower abdomen was prepped and draped in the usual sterile fashion. 1% lidocaine was used for local anesthesia. Following this, a 19 gauge, 7-cm, Yueh catheter was introduced. An ultrasound image was saved for documentation purposes. The paracentesis was performed. The catheter was removed and a dressing was applied. The patient tolerated the procedure well without immediate post procedural complication. Patient received post-procedure intravenous albumin; see nursing notes for details. FINDINGS: A total of approximately 4 L of amber fluid was removed. Samples were sent to the laboratory as requested by the clinical team. IMPRESSION: Successful ultrasound-guided paracentesis yielding 4 liters of peritoneal fluid. Electronically Signed   By: Gaylyn Rong M.D.   On: 02/14/2023 14:25    Assessment: Karla Young  is a 62 year old female with a history of decompensated cirrhosis secondary to Hep C (s/p treatment in 2015 and 2022 with eradication), alcohol use with last drink around Thanksgiving, complicated by ascites, EVs.  admitted with worsening HE and acute renal failure in setting of multiple paras for recurrent ascites.   Hepatic encephalopathy:  improving. taking linzess 145mg  daily for chronic constipation, lactulose increased yesterday to 20g TID as she was still having minimal BMs yesterday, has not had any BMs this morning. She is on xifaxan 550mg  BID. She is a/ox4 with no asterixis. Will increase lactulose to 30g TID.   Cirrhosis:MELD 3.0 today is 30. INR up slightly to 2.2 from 1.7 yesterday.  decompensated, end stage, disease has been compliated by ascites requiring multiple paracentesis. Recent US liver doppler without PVT. No suspicious hepatic lesions on recent CT exam. Unable to tolerate titration of diuretics due to renal function. Outpatient lasix 20mg /aldactone 50mg  had been on hold due to AKI which is improving, creatnine 1.03 today. Lasix 20mg  daily and aldactone 50mg  daily restarted yesterday. Paracentesis on 7/8 with SBP. She has ascites today but abdomen is not tense. May consider repeat paracentesis tomorrow.   Previously evaluated for TIPS candidacy but due to Young MELD, not a candidate. Currently being evaluated by [facility] for liver transplant. [facility] is aware of patient's current status, case was discussed by our team member Lewie Loron, NP with Sudie Bailey NP of [facility], Monday, no transfer necessary at this time unless patient has decline in status.   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 She has continued ascites, but non tense, may consider repeat para tomorrow. Can check cell count again at that time to ensure SBP responding to antibiotic therapy at that time as well.    Plan: Increase lactulose to 30g TID today  Continue xifaxan 550mg  BID Rocephin 2g daily x5 days Weight based albumin IV today( 25g x2, 12.5g x1, 8 hours apart) Continue with lasix 20mg /aldactone 50mg  daily, close monitoring of renal function Ciprofloxacin daily  for SBP prophylaxis Daily MELD labs Advance diet as tolerated, 2 g sodium/2L fluid restriction Consider repeat Paracentesis tomorrow  Continue with outpatient transplant evaluation at [facility]   LOS: 5 days    02/16/2023, 9:49 AM   Larwence Tu L. Jeanmarie Hubert, MSN, APRN, AGNP-C Adult-Gerontology Nurse Practitioner [facility]

## 2023-02-16 NOTE — Plan of Care (Signed)
  Problem: Acute Rehab PT Goals(only PT should resolve) Goal: Pt Will Go Supine/Side To Sit Outcome: Progressing Flowsheets (Taken 02/16/2023 1337) Pt will go Supine/Side to Sit: with supervision Goal: Pt Will Go Sit To Supine/Side Outcome: Progressing Flowsheets (Taken 02/16/2023 1337) Pt will go Sit to Supine/Side: with supervision Goal: Patient Will Transfer Sit To/From Stand Outcome: Progressing Flowsheets (Taken 02/16/2023 1337) Patient will transfer sit to/from stand: with supervision Goal: Pt Will Transfer Bed To Chair/Chair To Bed Outcome: Progressing Flowsheets (Taken 02/16/2023 1337) Pt will Transfer Bed to Chair/Chair to Bed: with supervision Goal: Pt Will Ambulate Outcome: Progressing Flowsheets (Taken 02/16/2023 1337) Pt will Ambulate:  > 125 feet  with least restrictive assistive device  with min guard assist Goal: Pt Will Go Up/Down Stairs Outcome: Progressing Flowsheets (Taken 02/16/2023 1337) Pt will Go Up / Down Stairs:  Flight  with min guard assist  with rail(s)  with least restrictive assistive device   [place] D. Hartnett-Rands, MS, PT [facility] PT [facility] [phone] 02/16/2023

## 2023-02-16 NOTE — Evaluation (Signed)
Physical Therapy Evaluation Patient Details Name: Karla Young MRN: [id number] DOB: 02/23/1961 Today's Date: 02/16/2023  History of Present Illness  As per H&P written by Dr. Ella Jubilee on 02/11/2023  Karla Young is a 62 y.o. female with medical history significant of cirrhosis, COPD, hypotension, hypothyroid, hepatitis C, anxiety and fibromyalgia who presented with altered mental status.   Patient currently is in a waiting list for liver transplant, she is getting outpatient paracentesis every week per GI recommendations. Last paracentesis 06.25.24 removing 2,7 L.   Yesterday afternoon patient was noted to be somnolent and less reactive. This morning when her family went to pick her up to bring her for outpatient paracentesis she was noted to be poorly responsive, very weak and deconditioned. Apparently she had falls at home that were unwitnessed.   Because of her severe change in mentation, EMS was called and she was transported to the ED.   Clinical Impression  Pt admitted with above diagnosis. Patient supine in bed in abdominal discomfort but agreeable to participating in evaluation today. Patient required extra time to come from supine to sit with HOB elevated. Patient able to physically come from sit to stand but required cues on sequencing of steps and hand placement with use of RW. Patient demonstrated a somewhat slow, labored cadence using RW with complaints of dizziness. Patient given cues to breathe through pain to deter dizziness. Patient limited by pain and fatigue. Patient on room air throughout. Patient reports she has 12 stairs with left railing to access her bedroom and full bath. Pt currently with functional limitations due to the deficits listed below (see PT Problem List). Pt will benefit from acute skilled PT to increase their independence and safety with mobility to allow discharge.           Assistance Recommended at Discharge    If plan is discharge home, recommend the  following:  Can travel by private vehicle  A little help with walking and/or transfers;A little help with bathing/dressing/bathroom;Assistance with cooking/housework;Help with stairs or ramp for entrance;Assist for transportation        Equipment Recommendations Rolling walker (2 wheels);BSC/3in1  Recommendations for Other Services       Functional Status Assessment Patient has had a recent decline in their functional status and demonstrates the ability to make significant improvements in function in a reasonable and predictable amount of time.     Precautions / Restrictions Precautions Precautions: Fall Precaution Comments: family told her she fell prior to admission Restrictions Weight Bearing Restrictions: No      Mobility  Bed Mobility Overal bed mobility: Needs Assistance Bed Mobility: Supine to Sit     Supine to sit: Supervision, HOB elevated     General bed mobility comments: increased time, use of bedrails    Transfers Overall transfer level: Needs assistance Equipment used: Rolling walker (2 wheels) Transfers: Sit to/from Stand, Bed to chair/wheelchair/BSC Sit to Stand: Min guard   Step pivot transfers: Min guard       General transfer comment: cues for step sequencing and hand placement with RW use    Ambulation/Gait Ambulation/Gait assistance: Min guard Gait Distance (Feet): 150 Feet Assistive device: Rolling walker (2 wheels) Gait Pattern/deviations: Step-through pattern, Decreased step length - right, Decreased step length - left, Decreased stride length Gait velocity: decreased     General Gait Details: somewhat slow, labored cadence using RW with complaints of dizziness; cues to breathe through pain to deter dizziness; limited by pain and fatigue; on room air throughout  Stairs            Wheelchair Mobility     Tilt Bed    Modified Rankin (Stroke Patients Only)       Balance Overall balance assessment: Needs  assistance Sitting-balance support: Bilateral upper extremity supported, Feet supported Sitting balance-Leahy Scale: Good     Standing balance support: No upper extremity supported, During functional activity, Reliant on assistive device for balance Standing balance-Leahy Scale: Poor Standing balance comment: fair/poor without assistive device; fair with RW         Pertinent Vitals/Pain Pain Assessment Pain Assessment: 0-10 Pain Score: 10-Worst pain ever Pain Location: abdomen Pain Descriptors / Indicators: Pressure Pain Intervention(s): Limited activity within patient's tolerance, Monitored during session, Repositioned    Home Living Family/patient expects to be discharged to:: Private residence Living Arrangements: Alone Available Help at Discharge: Family;Available PRN/intermittently Type of Home: House Home Access: Stairs to enter Entrance Stairs-Rails: Left Entrance Stairs-Number of Steps: 12   Home Layout: Two level;Bed/bath upstairs Home Equipment: Hand held shower head;Cane - single point      Prior Function Prior Level of Function : Independent/Modified Independent;History of Falls (last six months)             Mobility Comments: primarily household distances without assistive device ADLs Comments: independent prior to admission     Hand Dominance        Extremity/Trunk Assessment   Upper Extremity Assessment Upper Extremity Assessment: Generalized weakness    Lower Extremity Assessment Lower Extremity Assessment: Generalized weakness    Cervical / Trunk Assessment Cervical / Trunk Assessment: Normal  Communication   Communication: No difficulties  Cognition Arousal/Alertness: Awake/alert Behavior During Therapy: WFL for tasks assessed/performed Overall Cognitive Status: Within Functional Limits for tasks assessed        General Comments      Exercises     Assessment/Plan    PT Assessment Patient needs continued PT services  PT  Problem List Decreased strength;Decreased balance;Decreased mobility;Decreased knowledge of use of DME;Pain;Decreased activity tolerance       PT Treatment Interventions DME instruction;Functional mobility training;Balance training;Patient/family education;Gait training;Therapeutic activities;Stair training;Therapeutic exercise    PT Goals (Current goals can be found in the Care Plan section)  Acute Rehab PT Goals PT Goal Formulation: With patient Time For Goal Achievement: 03/02/23 Potential to Achieve Goals: Fair    Frequency Min 3X/week        AM-PAC PT "6 Clicks" Mobility  Outcome Measure Help needed turning from your back to your side while in a flat bed without using bedrails?: A Little Help needed moving from lying on your back to sitting on the side of a flat bed without using bedrails?: A Little Help needed moving to and from a bed to a chair (including a wheelchair)?: A Little Help needed standing up from a chair using your arms (e.g., wheelchair or bedside chair)?: A Little Help needed to walk in hospital room?: A Little Help needed climbing 3-5 steps with a railing? : A Little 6 Click Score: 18    End of Session   Activity Tolerance: Patient tolerated treatment well;Patient limited by fatigue;Patient limited by pain Patient left: in chair;with call bell/phone within reach Nurse Communication: Mobility status PT Visit Diagnosis: Unsteadiness on feet (R26.81);Muscle weakness (generalized) (M62.81);Other abnormalities of gait and mobility (R26.89);History of falling (Z91.81)    Time: 1610-9604 PT Time Calculation (min) (ACUTE ONLY): 28 min   Charges:   PT Evaluation $PT Eval Low Complexity: 1 Low PT Treatments $Therapeutic  Activity: 8-22 mins PT General Charges $$ ACUTE PT VISIT: 1 Visit         Katina Dung. Hartnett-Rands, MS, PT Per Diem PT [facility] [place] [phone]  Britta Mccreedy  Hartnett-Rands 02/16/2023, 1:33 PM

## 2023-02-16 NOTE — Progress Notes (Signed)
Progress Note   Patient: Karla Young WUJ:[id number] DOB: 1960-08-27 DOA: 02/11/2023     5 DOS: the patient was seen and examined on 02/16/2023   Brief hospital admission narrative : As per H&P written by Dr. Ella Jubilee on 02/11/2023 Karla Young is a 62 y.o. female with medical history significant of cirrhosis, COPD, hypotension, hypothyroid, hepatitis C, anxiety and fibromyalgia who presented with altered mental status.  Patient currently is in a waiting list for liver transplant, she is getting outpatient paracentesis every week per GI recommendations. Last paracentesis 06.25.24 removing 2,7 L.  Yesterday afternoon patient was noted to be somnolent and less reactive. This morning when her family went to pick her up to bring her for outpatient paracentesis she was noted to be poorly responsive, very weak and deconditioned. Apparently she had falls at home that were unwitnessed.  Because of her severe change in mentation, EMS was called and she was transported to the ED.   Per her daughter, who is providing the history, it is possible that patient was not taking her medications or having adequate po intake over last 24 hrs.  Patient is not able to give any information due to somnolence.   Assessment and Plan: 1) Hepatic encephalopathy (HCC) -Likely multifactorial including dehydration, uremia from AKI and elevated ammonia level. -Continue  xifaxan as recommended by GI service. -No significant BM so increase lactulose on 02/16/2023 -Patient received treatment with midodrine and albumin; no hepatorenal syndrome presence demonstrated. -Mentation improved significantly  AKI (acute kidney injury) (HCC) -Most likely prerenal; hepatorenal presents has been rule out. -Midodrine and albumin provided;   -Renal function has stabilized and most recent creatinine 0.83 -Avoid hypotension   Cirrhosis of liver with ascites (HCC) -Her liver failure is advanced.  -Actively following with [facility] transplant list  in [place] -Continue PPI and lactulose -Continue xifaxan -GI service consultation is appreciated; will follow any further input/recommendations. -Continue to hold diuretics; following GI recommendation after improvement in renal function, paracentesis provided a result suggesting the presence of SBP -Continue Rocephin for SBP Rx -Possible partial transition 02/17/2023 per GI team   COPD (chronic obstructive pulmonary disease) (HCC) -No signs of exacerbation.  -Continue as needed bronchodilator management.  Hypothyroidism -TSH within normal limit -Continue Synthroid.  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     Subjective:  -A friend bed is at bedside, -Oral intake is fair -No significant BM -Mentation apparently much better according to friend Karla Young) who is at bedside  Physical Exam: Vitals:   02/16/23 0451 02/16/23 0500 02/16/23 1229 02/16/23 1527  BP: 104/60   (!) 118/52  Pulse: 68   81  Resp: 16   17  Temp: 98.3 F (36.8 C)   98.7 F (37.1 C)  TempSrc: Oral   Oral  SpO2: 100%  99% 99%  Weight:  64 kg    Height:        Physical Exam  Gen:- Awake Alert, in no acute distress  HEENT:- [place].AT, +ve sclera icterus Neck-Supple Neck,No JVD,.  Lungs-  CTAB , fair air movement bilaterally  CV- S1, S2 normal, RRR Abd-  +ve B.Sounds, Abd Soft, No tenderness, ascites Extremity/Skin:- No  edema,   good pedal pulses  Psych-affect is appropriate, oriented x3 Neuro-generalized weakness, no new focal deficits, no tremors      Family Communication:  daughter previously updated at bedside.  Disposition: Status is:  Inpatient Remains inpatient appropriate because: Continue acute IV therapy and management for acute kidney injury and concerns for SBP.   Planned Discharge Destination: Home  Time spent: 40  minutes  Author: Shon Hale, MD 02/16/2023 7:25 PM  For on call review [email or web address].

## 2023-02-17 ENCOUNTER — Inpatient Hospital Stay (HOSPITAL_COMMUNITY): Payer: 59

## 2023-02-17 ENCOUNTER — Telehealth: Payer: Self-pay | Admitting: Gastroenterology

## 2023-02-17 DIAGNOSIS — K7682 Hepatic encephalopathy: Secondary | ICD-10-CM | POA: Diagnosis not present

## 2023-02-17 LAB — COMPREHENSIVE METABOLIC PANEL
ALT: 25 U/L (ref 0–44)
AST: 43 U/L — ABNORMAL HIGH (ref 15–41)
Albumin: 3.3 g/dL — ABNORMAL LOW (ref 3.5–5.0)
Alkaline Phosphatase: 55 U/L (ref 38–126)
Anion gap: 6 (ref 5–15)
BUN: 33 mg/dL — ABNORMAL HIGH (ref 8–23)
CO2: 21 mmol/L — ABNORMAL LOW (ref 22–32)
Calcium: 8.9 mg/dL (ref 8.9–10.3)
Chloride: 101 mmol/L (ref 98–111)
Creatinine, Ser: 1.07 mg/dL — ABNORMAL HIGH (ref 0.44–1.00)
GFR, Estimated: 59 mL/min — ABNORMAL LOW (ref >60)
Glucose, Bld: 126 mg/dL — ABNORMAL HIGH (ref 70–99)
Potassium: 4.2 mmol/L (ref 3.5–5.1)
Sodium: 128 mmol/L — ABNORMAL LOW (ref 135–145)
Total Bilirubin: 5.6 mg/dL — ABNORMAL HIGH (ref 0.3–1.2)
Total Protein: 5.9 g/dL — ABNORMAL LOW (ref 6.5–8.1)

## 2023-02-17 LAB — BODY FLUID CELL COUNT WITH DIFFERENTIAL
Eos, Fluid: 0 %
Lymphs, Fluid: 7 %
Monocyte-Macrophage-Serous Fluid: 18 % — ABNORMAL LOW (ref 50–90)
Neutrophil Count, Fluid: 75 % — ABNORMAL HIGH (ref 0–25)
Total Nucleated Cell Count, Fluid: 1250 cu mm — ABNORMAL HIGH (ref 0–1000)

## 2023-02-17 LAB — PROTIME-INR
INR: 2 — ABNORMAL HIGH (ref 0.8–1.2)
Prothrombin Time: 23.2 seconds — ABNORMAL HIGH (ref 11.4–15.2)

## 2023-02-17 MED ORDER — ALBUMIN HUMAN 25 % IV SOLN
25.0000 g | Freq: Once | INTRAVENOUS | Status: AC
  Administered 2023-02-17: 25 g via INTRAVENOUS

## 2023-02-17 MED ORDER — SODIUM CHLORIDE 0.9 % IV SOLN
2.0000 g | Freq: Two times a day (BID) | INTRAVENOUS | Status: DC
  Administered 2023-02-17 – 2023-02-21 (×8): 2 g via INTRAVENOUS
  Filled 2023-02-17 (×8): qty 12.5

## 2023-02-17 MED ORDER — ALBUMIN HUMAN 25 % IV SOLN
INTRAVENOUS | Status: AC
  Filled 2023-02-17: qty 100

## 2023-02-17 NOTE — Progress Notes (Signed)
Patient completed abd para today with removal of 4 liters. Cell count with ANC of 937.5, down from 3780 prior to antibiotics.  Antibiotics switched from Rocephin today to Cefepime given culture/sensitivities.   Leanna Battles. Dixon Boos [facility] [phone] 7/11/20243:14 PM

## 2023-02-17 NOTE — Progress Notes (Signed)
Pain medicine requested for pain in stomach rated to be 9 of 10 PRN administered

## 2023-02-17 NOTE — Progress Notes (Signed)
   Received referral for pt to go home with hospice care. I was able to speak with the daughter Alphonse Guild. We had discussion about pt goals.  The pt is not currently on a transplant list but is still undergoing a workup to be approved for transplant list. The daughter states that they do want to continue to move forward with the workup to see if she is eligible for a liver transplant. Discussion was had about hospice care and comfort care approach which would not be in line with there current goals at this time.   Please reach back out if the pt declines or if the goals transition to comfort care. We would be happy to assist the pt and family. Norm Parcel RN [phone]

## 2023-02-17 NOTE — Telephone Encounter (Signed)
Patient currently inpatient but to be discharged likely today per Dr. Tasia Catchings, she has pending appt tomorrow with hepatology transplant team.   Dr. Tasia Catchings recommends labs on Monday, please arrange. CBC, CMET, PT/INR

## 2023-02-17 NOTE — TOC Progression Note (Signed)
Transition of Care [facility]) - Progression Note    Patient Details  Name: Karla Young MRN: [id number] Date of Birth: 03-07-1961  Transition of Care [facility]) CM/SW Contact  Annice Needy, LCSW Phone Number: 02/17/2023, 10:49 AM  Clinical Narrative:    At request of daughter, [name], referral made to [facility], for hospice services at their home. Referral sent.      Barriers to Discharge: Continued Medical Work up  Expected Discharge Plan and Services                                               Social Determinants of Health (SDOH) Interventions SDOH Screenings   Food Insecurity: No Food Insecurity (02/11/2023)  Housing: Low Risk  (02/11/2023)  Transportation Needs: No Transportation Needs (02/11/2023)  Utilities: Not At Risk (02/11/2023)  Depression (PHQ2-9): Low Risk  (08/26/2022)  Financial Resource Strain: Low Risk  (08/03/2022)   Received from [facility]  Social Connections: Unknown (08/03/2022)   Received from [facility]  Stress: No Stress Concern Present (08/03/2022)   Received from [facility]  Tobacco Use: Medium Risk (02/11/2023)    Readmission Risk Interventions    08/06/2021   11:05 AM  Readmission Risk Prevention Plan  Medication Screening Complete  Transportation Screening Complete

## 2023-02-17 NOTE — Progress Notes (Signed)
Pt transported to [place] per stretcher, c/o acute pain in abdomen. Some distention noted. Procedure explained, consent obtained. Access obtained at RLQ without difficulty. Clear dark amber serous fluid retrieved under vacutainer suction. $ L total removed. Access removed from RLQ without difficulty. Dermabond and bandage applied, no bleeding or hematoma evident.   Transported back to floor in no obvious distress.

## 2023-02-17 NOTE — Progress Notes (Addendum)
Progress Note  Patient: Karla Young ZOX:[id number] DOB: [birth year] DOA: 02/11/2023     6 DOS: the patient was seen and examined on 02/17/2023   Brief hospital admission narrative : As per H&P written by Dr. Ella Jubilee on 02/11/2023 Karla Young is a 62 y.o. female with medical history significant of cirrhosis, COPD, hypotension, hypothyroid, hepatitis C, anxiety and fibromyalgia who presented with altered mental status.  Patient currently is in a waiting list for liver transplant, she is getting outpatient paracentesis every week per GI recommendations. Last paracentesis 06.25.24 removing 2,7 L.  Yesterday afternoon patient was noted to be somnolent and less reactive. This morning when her family went to pick her up to bring her for outpatient paracentesis she was noted to be poorly responsive, very weak and deconditioned. Apparently she had falls at home that were unwitnessed.  Because of her severe change in mentation, EMS was called and she was transported to the ED.   Per her daughter, who is providing the history, it is possible that patient was not taking her medications or having adequate po intake over last 24 hrs.  Patient is not able to give any information due to somnolence.   Assessment and Plan: 1) Hepatic encephalopathy (HCC) -Likely multifactorial including dehydration, uremia from AKI and elevated ammonia level. -Continue  xifaxan as recommended by GI service. -Having BMs after increasing dose of lactulose on 02/16/2023 -Patient received treatment with midodrine and albumin; no hepatorenal syndrome presence demonstrated. -Mentation improved significantly  2) Enterobacter cloacae SBP--- ascitic fluid culture from 02/14/2023 with Enterobacter cloacae -Stop IV Rocephin -Start IV cefepime -Repeat paracentesis with removal of 4 L of ascitic fluid on 02/17/2023---ascitic fluid studies are pending -Possible discharge home on oral Cipro in 1 to 2 days if continues to improve  3)AKI (acute  kidney injury) (HCC) -Most likely prerenal;  -Midodrine and albumin provided;   -Renal function has stabilized  -Avoid hypotension  4)Alcoholic liver cirrhosis with ascites----patient reports abstinence from alcohol since 06/2022 -Her liver failure is advanced.  -Actively following with [facility] transplant team to see if she is a candidate for possible transplant in the near future in [place]---she is not yet on the transplant list -Continue PPI and lactulose -Continue xifaxan -GI service consultation is appreciated; will follow any further input/recommendations. -Continue to hold diuretics;  -Repeat paracentesis on 02/17/2023 as above #2 INR 2.0  5)COPD (chronic obstructive pulmonary disease) (HCC) -No signs of exacerbation.  -Continue as needed bronchodilator management.  6)Hypothyroidism -TSH within normal limit -Continue Synthroid.  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   INR is 2.0  Subjective:  - Patient would like to eat more -Reports abdominal distention requesting paracentesis for palliative purposes -Had BMs with lactulose  Physical Exam: Vitals:   02/17/23 0724 02/17/23 1100 02/17/23 1200 02/17/23 1315  BP:  105/62 (!) 91/55 (!) 107/59  Pulse:    76  Resp:  16  18  Temp:    98.2 F (36.8 C)  TempSrc:    Oral  SpO2:  99%  100%  Weight: 65.3 kg     Height:       Physical Exam Gen:- Awake Alert, in no acute distress  HEENT:- [place].AT, +ve sclera icterus Neck-Supple Neck,No JVD,.  Lungs-  CTAB , fair air movement bilaterally  CV- S1, S2 normal, RRR Abd-  +ve B.Sounds, Abd Soft, No tenderness, improved abdominal distention  after repeat paracentesis on 02/17/2023 Extremity/Skin:- No  edema,   good pedal pulses  Psych-affect is appropriate, oriented x3 Neuro-generalized weakness, no new focal  deficits, no tremors  Family Communication: I called and updated patient's daughter Karla Young who is an Charity fundraiser on 02/17/2023    Procedures:- Paracentensis 02/14/23---- 4 Liters removed--cx with Enterobacter Cloacae Paracentensis 02/17/23----4 Liters removed  Disposition:--- home with [facility] Status is: Inpatient Remains inpatient appropriate because: Continue acute IV therapy and management for acute kidney injury and concerns for SBP.   Planned Discharge Destination: Home  Author: Shon Hale, MD 02/17/2023 2:15 PM  For on call review [email or web address].

## 2023-02-17 NOTE — Progress Notes (Addendum)
Gastroenterology Progress Note   Referring Provider: No ref. provider found Primary Care Physician:  Wilmon Pali, FNP Primary Gastroenterologist:  Dr. Tommy Medal Liver  Patient ID: Karla Young; [id number]; 29-Sep-1960   Subjective:    Feels like abdomen more swollen. Wants a tap if possible. Hard to take deep breath. Abdominal pain due to distention. +BMs. Desires advanced diet.   Objective:   Vital signs in last 24 hours: Temp:  [98.5 F (36.9 C)-99.4 F (37.4 C)] 99.4 F (37.4 C) (07/11 0426) Pulse Rate:  [72-81] 74 (07/11 0426) Resp:  [16-17] 16 (07/10 2133) BP: (102-118)/(52-65) 102/65 (07/11 0426) SpO2:  [99 %-100 %] 99 % (07/11 0426) Weight:  [65.3 kg] 65.3 kg (07/11 0724) Last BM Date : 02/16/23 General:   Alert,  chronically ill appearing female sitting up in chair. Pleasant and cooperative in NAD Head:  Normocephalic and atraumatic. Eyes:  Sclera clear, + icterus.   Abdomen:  Soft, distended, moderately tense. Normal bowel sounds, without guarding, and without rebound.   Extremities:  Without clubbing, deformity. Trace bilateral lower ext edema. Neurologic:  Alert and  oriented x4;  grossly normal neurologically. Psych:  Alert and cooperative. Normal mood and affect.  Intake/Output from previous day: 07/10 0701 - 07/11 0700 In: 1091 [P.O.:720; IV Piggyback:371] Out: -  Intake/Output this shift: No intake/output data recorded.  Lab Results: CBC Recent Labs    02/15/23 0845 02/16/23 0457  WBC 10.2 10.8*  HGB 9.8* 9.5*  HCT 28.2* 27.7*  MCV 88.4 89.6  PLT 68* 81*   BMET Recent Labs    02/15/23 0845 02/16/23 0457  NA 132* 130*  K 4.1 4.0  CL 105 103  CO2 19* 22  GLUCOSE 90 104*  BUN 32* 35*  CREATININE 0.83 1.03*  CALCIUM 9.0 8.8*   LFTs Recent Labs    02/15/23 0845 02/16/23 0457  BILITOT 6.5* 5.0*  ALKPHOS 53 52  AST 38 34  ALT 25 24  PROT 5.7* 5.3*  ALBUMIN 3.2* 2.8*   No results for input(s): "LIPASE" in the last 72  hours. PT/INR Recent Labs    02/15/23 0845 02/16/23 0457  LABPROT 20.5* 24.3*  INR 1.7* 2.2*         Imaging Studies: US Paracentesis  Result Date: 02/14/2023 INDICATION: Ascites EXAM: ULTRASOUND GUIDED RIGHT PARACENTESIS MEDICATIONS: None. COMPLICATIONS: None immediate. PROCEDURE: Informed written consent was obtained from the patient after a discussion of the risks, benefits and alternatives to treatment. A timeout was performed prior to the initiation of the procedure. Initial ultrasound scanning demonstrates a large amount of ascites within the right lower abdominal quadrant. The right lower abdomen was prepped and draped in the usual sterile fashion. 1% lidocaine was used for local anesthesia. Following this, a 19 gauge, 7-cm, Yueh catheter was introduced. An ultrasound image was saved for documentation purposes. The paracentesis was performed. The catheter was removed and a dressing was applied. The patient tolerated the procedure well without immediate post procedural complication. Patient received post-procedure intravenous albumin; see nursing notes for details. FINDINGS: A total of approximately 4 L of amber fluid was removed. Samples were sent to the laboratory as requested by the clinical team. IMPRESSION: Successful ultrasound-guided paracentesis yielding 4 liters of peritoneal fluid. Electronically Signed   By: Gaylyn Rong M.D.   On: 02/14/2023 14:25   CT Head Wo Contrast  Result Date: 02/11/2023 CLINICAL DATA:  Fall.  Altered mental status. EXAM: CT HEAD WITHOUT CONTRAST TECHNIQUE: Contiguous axial images were obtained from the  base of the skull through the vertex without intravenous contrast. RADIATION DOSE REDUCTION: This exam was performed according to the departmental dose-optimization program which includes automated exposure control, adjustment of the mA and/or kV according to patient size and/or use of iterative reconstruction technique. COMPARISON:  None Available.  FINDINGS: Brain: There is no evidence for acute hemorrhage, hydrocephalus, mass lesion, or abnormal extra-axial fluid collection. No definite CT evidence for acute infarction. Patchy low attenuation in the deep hemispheric and periventricular white matter is nonspecific, but likely reflects chronic microvascular ischemic demyelination. Vascular: No hyperdense vessel or unexpected calcification. Skull: No evidence for fracture. No worrisome lytic or sclerotic lesion. Sinuses/Orbits: Left mastoid effusion. Visualized portions of the globes and intraorbital fat are unremarkable. Other: None. IMPRESSION: 1. No acute intracranial abnormality. 2. Patchy low attenuation in the deep hemispheric and periventricular white matter is nonspecific, but likely reflects chronic microvascular ischemic demyelination. 3. Left mastoid effusion. Electronically Signed   By: Kennith Center M.D.   On: 02/11/2023 13:06   US Paracentesis  Result Date: 02/01/2023 INDICATION: History of cirrhosis and recurrent ascites. EXAM: ULTRASOUND GUIDED DIAGNOSTIC AND THERAPEUTIC PARACENTESIS MEDICATIONS: None. COMPLICATIONS: None immediate. PROCEDURE: Informed written consent was obtained from the patient after a discussion of the risks, benefits and alternatives to treatment. A timeout was performed prior to the initiation of the procedure. Initial ultrasound scanning demonstrates a moderate amount of ascites within the right lower abdominal quadrant. The right lower abdomen was prepped and draped in the usual sterile fashion. 1% lidocaine was used for local anesthesia. Following this, a 19 gauge, 7-cm, Yueh catheter was introduced. An ultrasound image was saved for documentation purposes. The paracentesis was performed. The catheter was removed and a dressing was applied. The patient tolerated the procedure well without immediate post procedural complication. FINDINGS: A total of approximately 2.7 L of hazy yellow fluid was removed. Samples were  sent to the laboratory as requested by the clinical team. IMPRESSION: Successful ultrasound-guided paracentesis yielding 2.7 liters of peritoneal fluid. Electronically Signed   By: Obie Dredge M.D.   On: 02/01/2023 13:51   US Paracentesis  Result Date: 01/26/2023 INDICATION: Patient with history of cirrhosis, recurrent ascites. Request for diagnostic and therapeutic paracentesis. EXAM: ULTRASOUND GUIDED DIAGNOSTIC AND THERAPEUTIC PARACENTESIS MEDICATIONS: 6 mL 1% lidocaine. COMPLICATIONS: None immediate. PROCEDURE: Informed written consent was obtained from the patient after a discussion of the risks, benefits and alternatives to treatment. A timeout was performed prior to the initiation of the procedure. Initial ultrasound scanning demonstrates a large amount of ascites within the right lower abdominal quadrant. The right lower abdomen was prepped and draped in the usual sterile fashion. 1% lidocaine was used for local anesthesia. Following this, a 19 gauge, 7-cm, Yueh catheter was introduced. An ultrasound image was saved for documentation purposes. The paracentesis was performed. The catheter was removed and a dressing was applied. The patient tolerated the procedure well without immediate post procedural complication. Patient received post-procedure intravenous albumin; see nursing notes for details. FINDINGS: A total of approximately 3.2 L of hazy yellow fluid was removed. Samples were sent to the laboratory as requested by the clinical team. IMPRESSION: Successful ultrasound-guided paracentesis yielding 3.2 liters of peritoneal fluid. Performed by [name], PA-C PLAN: The patient has previously been evaluated by the [facility], and deemed not a candidate for intervention. Electronically Signed   By: Simonne Come M.D.   On: 01/26/2023 11:42  [2 weeks]  Assessment:    Karla Young is a  62 year old female with a history of decompensated  cirrhosis secondary to Hep C (s/p treatment in 2015 and 2022 with eradication), alcohol use with last drink around Thanksgiving, complicated by ascites, EVs, admitted with worsening HE and acute renal failure in setting of multiple paras for recurrent ascites.    Hepatic encephalopathy: improved. taking linzess 145mg  daily for chronic constipation, lactulose increased to 30g TID, patient states she has had increased stools, none recorded. She is on xifaxan 550mg  BID. She is a/ox4 with no asterixis.    Cirrhosis:MELD 3.0 yesterday 26. INR up slightly to 2.2 yesterday. No labs drawn today. Recent US liver doppler without PVT. No suspicious hepatic lesions on recent CT exam. Unable to tolerate titration of diuretics due to renal function. Outpatient lasix 20mg /aldactone 50mg  had been on hold due to AKI. Restarted 02/15/23. Initial Creatinine on admission was 2.56 (up from 0.87). Two days ago 0.83 and Creatine 1.03 yesterday. Paracentesis on 7/8 with SBP. She has recurrent tense ascites desiring paracentesis. .    Previously evaluated for TIPS candidacy but due to Young MELD, not a candidate. Currently being evaluated by [facility] for liver transplant. [facility] is aware of patient's current status, case was discussed by our team member Lewie Loron, NP with Sudie Bailey NP of [facility], Monday, no transfer necessary at this time unless patient has decline in status.    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 Culture grew Enterobacter cloacae sensitive to cefepime, ceftazidime. She has continued ascites, progressed and complains of discomfort and difficulty taking deep breaths. Consider repeat therapeutic paracentesis but limit to 4 liters due to issues with recent AKI. Check cell count.     Plan:   Titrate lactulose to 3 soft BMs daily. Continue Xifaxan 550mg  BID for  now. Complete five days of Rocephin 2g daily. Continue diuretics, currently on lasix 20mg /aldactone 50mg . Watch renal function closely. She will require chronic SBP prophylaxis moving forward, Cipro or Bactrim.  Paracentesis today (if available) with no more than 4 liters. Will provide albumin. Check cell count.  Continue with outpatient liver transplant evaluation at [facility]. Two gram sodium restricted diet.  CMET, PT/INR today.   LOS: 6 days   Leanna Battles. Dixon Boos [facility] [phone] 7/11/202410:12 AM

## 2023-02-18 ENCOUNTER — Telehealth: Payer: Self-pay | Admitting: Gastroenterology

## 2023-02-18 ENCOUNTER — Other Ambulatory Visit (INDEPENDENT_AMBULATORY_CARE_PROVIDER_SITE_OTHER): Payer: Self-pay | Admitting: *Deleted

## 2023-02-18 DIAGNOSIS — K652 Spontaneous bacterial peritonitis: Secondary | ICD-10-CM | POA: Diagnosis not present

## 2023-02-18 DIAGNOSIS — K7031 Alcoholic cirrhosis of liver with ascites: Secondary | ICD-10-CM

## 2023-02-18 DIAGNOSIS — K7682 Hepatic encephalopathy: Secondary | ICD-10-CM | POA: Diagnosis not present

## 2023-02-18 LAB — COMPREHENSIVE METABOLIC PANEL
ALT: 22 U/L (ref 0–44)
AST: 35 U/L (ref 15–41)
Albumin: 3 g/dL — ABNORMAL LOW (ref 3.5–5.0)
Alkaline Phosphatase: 55 U/L (ref 38–126)
Anion gap: 6 (ref 5–15)
BUN: 31 mg/dL — ABNORMAL HIGH (ref 8–23)
CO2: 20 mmol/L — ABNORMAL LOW (ref 22–32)
Calcium: 8.5 mg/dL — ABNORMAL LOW (ref 8.9–10.3)
Chloride: 102 mmol/L (ref 98–111)
Creatinine, Ser: 1.14 mg/dL — ABNORMAL HIGH (ref 0.44–1.00)
GFR, Estimated: 55 mL/min — ABNORMAL LOW (ref >60)
Glucose, Bld: 104 mg/dL — ABNORMAL HIGH (ref 70–99)
Potassium: 4 mmol/L (ref 3.5–5.1)
Sodium: 128 mmol/L — ABNORMAL LOW (ref 135–145)
Total Bilirubin: 4.6 mg/dL — ABNORMAL HIGH (ref 0.3–1.2)
Total Protein: 5.3 g/dL — ABNORMAL LOW (ref 6.5–8.1)

## 2023-02-18 LAB — CBC
HCT: 25 % — ABNORMAL LOW (ref 36.0–46.0)
Hemoglobin: 8.6 g/dL — ABNORMAL LOW (ref 12.0–15.0)
MCH: 30.2 pg (ref 26.0–34.0)
MCHC: 34.4 g/dL (ref 30.0–36.0)
MCV: 87.7 fL (ref 80.0–100.0)
Platelets: 81 10*3/uL — ABNORMAL LOW (ref 150–400)
RBC: 2.85 MIL/uL — ABNORMAL LOW (ref 3.87–5.11)
RDW: 18.3 % — ABNORMAL HIGH (ref 11.5–15.5)
WBC: 14.1 10*3/uL — ABNORMAL HIGH (ref 4.0–10.5)
nRBC: 0 % (ref 0.0–0.2)

## 2023-02-18 LAB — PATHOLOGIST SMEAR REVIEW

## 2023-02-18 LAB — PROTIME-INR
INR: 2.4 — ABNORMAL HIGH (ref 0.8–1.2)
Prothrombin Time: 26 seconds — ABNORMAL HIGH (ref 11.4–15.2)

## 2023-02-18 MED ORDER — PHYTONADIONE 5 MG PO TABS
5.0000 mg | ORAL_TABLET | Freq: Once | ORAL | Status: AC
  Administered 2023-02-18: 5 mg via ORAL
  Filled 2023-02-18: qty 1

## 2023-02-18 NOTE — Progress Notes (Signed)
Pt had multiple bowel movements during the night. BP soft this a.m. Severe pain reported, PRN Tylenol given for lower pain score than stated, per pt request.

## 2023-02-18 NOTE — Progress Notes (Addendum)
Physical Therapy Treatment Patient Details Name: Karla Young MRN: [id number] DOB: [birth year] Today's Date: 02/18/2023   History of Present Illness As per H&P written by Dr. Ella Jubilee on 02/11/2023  Karla Young is a 62 y.o. female with medical history significant of cirrhosis, COPD, hypotension, hypothyroid, hepatitis C, anxiety and fibromyalgia who presented with altered mental status.   Patient currently is in a waiting list for liver transplant, she is getting outpatient paracentesis every week per GI recommendations. Last paracentesis 06.25.24 removing 2,7 L.   Yesterday afternoon patient was noted to be somnolent and less reactive. This morning when her family went to pick her up to bring her for outpatient paracentesis she was noted to be poorly responsive, very weak and deconditioned. Apparently she had falls at home that were unwitnessed.   Because of her severe change in mentation, EMS was called and she was transported to the ED.    PT Comments  Patient tolerated transfer from sit to stand and ambulation in hallways well using RW. Ambulation distance limited due to fatigue. Few verbal cues used. Patient will benefit from continued skilled physical therapy in hospital and recommended venue below to increase strength, balance, endurance for safe ADLs and gait.      Assistance Recommended at Discharge    If plan is discharge home, recommend the following:  Can travel by private vehicle    A little help with walking and/or transfers;A little help with bathing/dressing/bathroom;Assistance with cooking/housework;Help with stairs or ramp for entrance;Assist for transportation      Equipment Recommendations  Rolling walker (2 wheels);BSC/3in1    Recommendations for Other Services       Precautions / Restrictions Precautions Precautions: Fall Restrictions Weight Bearing Restrictions: No     Mobility  Bed Mobility Overal bed mobility: Modified Independent Bed Mobility: Supine to Sit      Supine to sit: HOB elevated, Modified independent (Device/Increase time)     General bed mobility comments: increased time, use of bedrails    Transfers Overall transfer level: Modified independent Equipment used: Rolling walker (2 wheels) Transfers: Sit to/from Stand, Bed to chair/wheelchair/BSC Sit to Stand: Min guard   Step pivot transfers: Min guard       General transfer comment: cues for step sequencing    Ambulation/Gait Ambulation/Gait assistance: Min guard Gait Distance (Feet): 125 Feet Assistive device: Rolling walker (2 wheels) Gait Pattern/deviations: Step-through pattern, Decreased step length - right, Decreased step length - left, Decreased stride length, WFL(Within Functional Limits) Gait velocity: decreased     General Gait Details: somewhat slow, labored cadence using RW; on room air throughout   Praxair Mobility     Tilt Bed    Modified Rankin (Stroke Patients Only)       Balance Overall balance assessment: Modified Independent Sitting-balance support: Bilateral upper extremity supported, Feet supported Sitting balance-Leahy Scale: Good Sitting balance - Comments: good seated at EOB   Standing balance support: No upper extremity supported, During functional activity Standing balance-Leahy Scale: Fair Standing balance comment: fair/good without assistive device; good with RW                            Cognition Arousal/Alertness: Awake/alert Behavior During Therapy: WFL for tasks assessed/performed Overall Cognitive Status: Within Functional Limits for tasks assessed  Exercises      General Comments        Pertinent Vitals/Pain Pain Assessment Pain Assessment: No/denies pain   PT Goals (current goals can now be found in the care plan section) Acute Rehab PT Goals PT Goal Formulation: With patient Time For Goal Achievement:  03/02/23 Potential to Achieve Goals: Good Progress towards PT goals: Progressing toward goals    Frequency    Min 3X/week      PT Plan Current plan remains appropriate    Co-evaluation              AM-PAC PT "6 Clicks" Mobility   Outcome Measure  Help needed turning from your back to your side while in a flat bed without using bedrails?: A Little Help needed moving from lying on your back to sitting on the side of a flat bed without using bedrails?: A Little Help needed moving to and from a bed to a chair (including a wheelchair)?: A Little Help needed standing up from a chair using your arms (e.g., wheelchair or bedside chair)?: A Little Help needed to walk in hospital room?: A Little Help needed climbing 3-5 steps with a railing? : A Little 6 Click Score: 18    End of Session Equipment Utilized During Treatment: Gait belt Activity Tolerance: Patient tolerated treatment well;Patient limited by fatigue Patient left: in bed;with call bell/phone within reach;with bed alarm set   PT Visit Diagnosis: Unsteadiness on feet (R26.81);Muscle weakness (generalized) (M62.81);Other abnormalities of gait and mobility (R26.89);History of falling (Z91.81)     Time: 5284-1324 PT Time Calculation (min) (ACUTE ONLY): 13 min  Charges:    $Therapeutic Activity: 8-22 mins PT General Charges $$ ACUTE PT VISIT: 1 Visit                    Colleen Pecoraro SPT  [place], DPT program    This licensed practitioner was present in the room guiding the student in service delivery. Therapy student was participating in the provision of services, and the practitioner was not engaged in treating another patient or doing other tasks at the same time.   11:02 AM, 02/18/23 Wyman Songster PT, DPT Physical Therapist at [facility]

## 2023-02-18 NOTE — TOC Progression Note (Signed)
Transition of Care [facility]) - Progression Note    Patient Details  Name: Karla Young MRN: [id number] Date of Birth: Apr 30, 1961  Transition of Care [facility]) CM/SW Contact  Annice Needy, LCSW Phone Number: 02/18/2023, 4:06 PM  Clinical Narrative:    Daughter, [name], is agreeable to [facility] services. [facility] is accepting of referral.      Barriers to Discharge: Continued Medical Work up  Expected Discharge Plan and Services                                   HH Arranged: RN, Nurse's Aide, PT   Date HH Agency Contacted: 02/18/23 Time HH Agency Contacted: 1604 Representative spoke with at [facility] Agency: Clifton Custard   Social Determinants of Health (SDOH) Interventions SDOH Screenings   Food Insecurity: No Food Insecurity (02/11/2023)  Housing: Low Risk  (02/11/2023)  Transportation Needs: No Transportation Needs (02/11/2023)  Utilities: Not At Risk (02/11/2023)  Depression (PHQ2-9): Low Risk  (08/26/2022)  Financial Resource Strain: Low Risk  (08/03/2022)   Received from [facility]  Social Connections: Unknown (08/03/2022)   Received from [facility]  Stress: No Stress Concern Present (08/03/2022)   Received from [facility]  Tobacco Use: Medium Risk (02/11/2023)    Readmission Risk Interventions    08/06/2021   11:05 AM  Readmission Risk Prevention Plan  Medication Screening Complete  Transportation Screening Complete

## 2023-02-18 NOTE — Plan of Care (Signed)
  Problem: Education: Goal: Knowledge of General Education information will improve Description: Including pain rating scale, medication(s)/side effects and non-pharmacologic comfort measures Outcome: Progressing   Problem: Health Behavior/Discharge Planning: Goal: Ability to manage health-related needs will improve Outcome: Progressing   Problem: Clinical Measurements: Goal: Ability to maintain clinical measurements within normal limits will improve Outcome: Progressing Goal: Will remain free from infection Outcome: Progressing Goal: Diagnostic test results will improve Outcome: Progressing Goal: Respiratory complications will improve Outcome: Progressing Goal: Cardiovascular complication will be avoided Outcome: Progressing   Problem: Activity: Goal: Risk for activity intolerance will decrease Outcome: Progressing   Problem: Coping: Goal: Level of anxiety will decrease Outcome: Progressing   Problem: Elimination: Goal: Will not experience complications related to bowel motility Outcome: Progressing   

## 2023-02-18 NOTE — Telephone Encounter (Signed)
Pt still in hospital but she is aware to do labs at [facility] on monday

## 2023-02-18 NOTE — Telephone Encounter (Signed)
Patient has seen [facility] as well as Tobi Bastos.  Please make her a hospital follow up in 2 weeks with either of them.   Brooke Bonito, MSN, APRN, FNP-BC, AGACNP-BC [facility] at [facility]

## 2023-02-18 NOTE — Progress Notes (Signed)
Progress Note  Patient: Karla Young ZOX:[id number] DOB: 09-15-1960 DOA: 02/11/2023     7 DOS: the patient was seen and examined on 02/18/2023   Brief hospital admission narrative : As per H&P written by Dr. Ella Jubilee on 02/11/2023 Ahilyn Gingras is a 62 y.o. female with medical history significant of cirrhosis, COPD, hypotension, hypothyroid, hepatitis C, anxiety and fibromyalgia who presented with altered mental status.  Patient currently is in a waiting list for liver transplant, she is getting outpatient paracentesis every week per GI recommendations. Last paracentesis 06.25.24 removing 2,7 L.  Yesterday afternoon patient was noted to be somnolent and less reactive. This morning when her family went to pick her up to bring her for outpatient paracentesis she was noted to be poorly responsive, very weak and deconditioned. Apparently she had falls at home that were unwitnessed.  Because of her severe change in mentation, EMS was called and she was transported to the ED.   Per her daughter, who is providing the history, it is possible that patient was not taking her medications or having adequate po intake over last 24 hrs.  Patient is not able to give any information due to somnolence.   Assessment and Plan: 1) Hepatic encephalopathy (HCC) -Likely multifactorial including dehydration, uremia from AKI and elevated ammonia level. -Continue  xifaxan as recommended by GI service. -Having BMs after increasing dose of lactulose on 02/16/2023 -Patient received treatment with midodrine and albumin; no hepatorenal syndrome presence demonstrated. -Mentation improved significantly  2) Enterobacter cloacae SBP--- ascitic fluid culture from 02/14/2023 with Enterobacter cloacae -Repeat paracentesis with removal of 4 L of ascitic fluid on 02/17/2023---ascitic fluid studies are pending 02/18/23 --WBC is up to 14.1 from 10.8---?? Why --Stopped IV Rocephin -Started IV cefepime on 02/17/23 -Discussed with ID physician Dr.  Kara Mead and GI physician Dr. Marletta Lor--- repeat CBC in a.m.  3)AKI (acute kidney injury) (HCC) -Most likely prerenal;  -Midodrine and albumin provided;   -Renal function has stabilized  -Avoid hypotension  4)Alcoholic liver cirrhosis with ascites----patient reports abstinence from alcohol since 06/2022 -Her liver failure is advanced.  -Actively following with [facility] transplant team to see if she is a candidate for possible transplant in the near future in [place]---she is not yet on the transplant list -Continue PPI and lactulose -Continue xifaxan -GI service consultation is appreciated; will follow any further input/recommendations. -Continue to hold diuretics;  -Repeat paracentesis on 02/17/2023 as above #2 INR 2.4--- INR trending up give vitamin K  5)COPD (chronic obstructive pulmonary disease) (HCC) -No signs of exacerbation.  -Continue as needed bronchodilator management.  6)Hypothyroidism -TSH within normal limit -Continue Synthroid.  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   INR is 2.04  Subjective:  - No fever  Or chills  Having BMs with lactulose... Oral intake is fair -  Physical Exam: Vitals:   02/17/23 1315 02/17/23 2030 02/18/23 0546 02/18/23 1356  BP: (!) 107/59 117/62 (!) 93/54 (!) 102/54  Pulse: 76 83 78 86  Resp: 18 18 16 16   Temp: 98.2 F (36.8 C) 97.7 F (36.5 C) 98.6 F (37 C) 98.4 F (36.9 C)  TempSrc: Oral Oral Oral Oral  SpO2: 100% 100% 100% 100%  Weight:   61.7 kg   Height:       Physical Exam Gen:- Awake Alert, in no acute distress  HEENT:- [place].AT, +ve sclera icterus Neck-Supple Neck,No JVD,.  Lungs-  CTAB , fair air movement bilaterally  CV- S1, S2 normal, RRR Abd-  +ve B.Sounds, Abd Soft, No tenderness, improved abdominal distention after repeat paracentesis  on 02/17/2023 Extremity/Skin:- No  edema,   good pedal pulses  Psych-affect is appropriate, oriented x3 Neuro-generalized weakness, no new focal deficits, no tremors  Family Communication: I called and updated patient's daughter Alphonse Guild who is an Char[employer] fundraiser on 02/17/2023  and 02/18/23  Procedures:- Paracentensis 02/14/23---- 4 Liters removed--cx with Enterobacter Cloacae Paracentensis 02/17/23----4 Liters removed  Disposition:--- home with [facility] Status is: Inpatient Remains inpatient appropriate because: Continue acute IV therapy and management for acute kidney injury and concerns for SBP.   Planned Discharge Destination: Home  Author: Shon Hale, MD 02/18/2023 5:02 PM  For on call review [email or web address].

## 2023-02-18 NOTE — Progress Notes (Signed)
Gastroenterology Progress Note   Referring Provider: No ref. provider found Primary Care Physician:  Wilmon Pali, FNP Primary Gastroenterologist:  Dr. [facility]  Patient ID: Karla Young; [id number]; 06-02-1961    Subjective   Despite undergoing paracentesis, abdomen continues to feel swollen.  Denies any shortness of breath or abdominal pain. + Flatus and Bms.  Tolerating diet.  Wants to go home.  Objective   Vital signs in last 24 hours Temp:  [97.7 F (36.5 C)-98.6 F (37 C)] 98.4 F (36.9 C) (07/12 1356) Pulse Rate:  [78-86] 86 (07/12 1356) Resp:  [16-18] 16 (07/12 1356) BP: (93-117)/(54-62) 102/54 (07/12 1356) SpO2:  [100 %] 100 % (07/12 1356) Weight:  [61.7 kg] 61.7 kg (07/12 0546) Last BM Date : 02/17/23  Physical Exam General:   Alert and oriented, pleasant Head:  Normocephalic and atraumatic. Eyes:  No icterus, sclera clear. Conjuctiva pink.  Mouth:  Without lesions, mucosa pink and moist.  Neck:  Supple, without thyromegaly or masses.  Abdomen:  Bowel sounds present, soft, non-tender,. Distended. No HSM or hernias noted. No rebound or guarding. No masses appreciated  Msk:  Symmetrical without gross deformities. Normal posture. Extremities:  Without clubbing or edema. Neurologic:  Alert and  oriented x4;  grossly normal neurologically. Skin:  Warm and dry, intact without significant lesions.  Psych:  Alert and cooperative. Normal mood and affect.  Intake/Output from previous day: 07/11 0701 - 07/12 0700 In: 1040 [P.O.:840; IV Piggyback:200] Out: -  Intake/Output this shift: Total I/O In: 480 [P.O.:480] Out: -   Lab Results  Recent Labs    02/16/23 0457 02/18/23 0433  WBC 10.8* 14.1*  HGB 9.5* 8.6*  HCT 27.7* 25.0*  PLT 81* 81*   BMET Recent Labs    02/16/23 0457 02/17/23 1122 02/18/23 0433  NA 130* 128* 128*  K 4.0 4.2 4.0  CL 103 101 102  CO2 22 21* 20*  GLUCOSE 104* 126* 104*  BUN 35* 33* 31*  CREATININE 1.03*  1.07* 1.14*  CALCIUM 8.8* 8.9 8.5*   LFT Recent Labs    02/16/23 0457 02/17/23 1122 02/18/23 0433  PROT 5.3* 5.9* 5.3*  ALBUMIN 2.8* 3.3* 3.0*  AST 34 43* 35  ALT 24 25 22   ALKPHOS 52 55 55  BILITOT 5.0* 5.6* 4.6*   PT/INR Recent Labs    02/17/23 1122 02/18/23 0433  LABPROT 23.2* 26.0*  INR 2.0* 2.4*   Hepatitis Panel No results for input(s): "HEPBSAG", "HCVAB", "HEPAIGM", "HEPBIGM" in the last 72 hours.  Studies/Results US Paracentesis  Result Date: 02/17/2023 INDICATION: Abdominal distension.  Ascites. EXAM: ULTRASOUND GUIDED right lower quadrant PARACENTESIS MEDICATIONS: None. COMPLICATIONS: None immediate. PROCEDURE: Informed written consent was obtained from the patient after a discussion of the risks, benefits and alternatives to treatment. A timeout was performed prior to the initiation of the procedure. Initial ultrasound scanning demonstrates a large amount of ascites within the right lower abdominal quadrant. The right lower abdomen was prepped and draped in the usual sterile fashion. 1% lidocaine was used for local anesthesia. Following this, a 19 gauge, 7-cm, Yueh catheter was introduced. An ultrasound image was saved for documentation purposes. The paracentesis was performed. The catheter was removed and a dressing was applied. The patient tolerated the procedure well without immediate post procedural complication. FINDINGS: A total of approximately 4.0 of yellow fluid was removed. Samples were sent to the laboratory as requested by the clinical team. IMPRESSION: Successful ultrasound-guided paracentesis yielding 4.0 liters of peritoneal fluid. Electronically  Signed   By: Marin Roberts M.D.   On: 02/17/2023 12:42   US Paracentesis  Result Date: 02/14/2023 INDICATION: Ascites EXAM: ULTRASOUND GUIDED RIGHT PARACENTESIS MEDICATIONS: None. COMPLICATIONS: None immediate. PROCEDURE: Informed written consent was obtained from the patient after a discussion of the risks,  benefits and alternatives to treatment. A timeout was performed prior to the initiation of the procedure. Initial ultrasound scanning demonstrates a large amount of ascites within the right lower abdominal quadrant. The right lower abdomen was prepped and draped in the usual sterile fashion. 1% lidocaine was used for local anesthesia. Following this, a 19 gauge, 7-cm, Yueh catheter was introduced. An ultrasound image was saved for documentation purposes. The paracentesis was performed. The catheter was removed and a dressing was applied. The patient tolerated the procedure well without immediate post procedural complication. Patient received post-procedure intravenous albumin; see nursing notes for details. FINDINGS: A total of approximately 4 L of amber fluid was removed. Samples were sent to the laboratory as requested by the clinical team. IMPRESSION: Successful ultrasound-guided paracentesis yielding 4 liters of peritoneal fluid. Electronically Signed   By: Gaylyn Rong M.D.   On: 02/14/2023 14:25   CT Head Wo Contrast  Result Date: 02/11/2023 CLINICAL DATA:  Fall.  Altered mental status. EXAM: CT HEAD WITHOUT CONTRAST TECHNIQUE: Contiguous axial images were obtained from the base of the skull through the vertex without intravenous contrast. RADIATION DOSE REDUCTION: This exam was performed according to the departmental dose-optimization program which includes automated exposure control, adjustment of the mA and/or kV according to patient size and/or use of iterative reconstruction technique. COMPARISON:  None Available. FINDINGS: Brain: There is no evidence for acute hemorrhage, hydrocephalus, mass lesion, or abnormal extra-axial fluid collection. No definite CT evidence for acute infarction. Patchy low attenuation in the deep hemispheric and periventricular white matter is nonspecific, but likely reflects chronic microvascular ischemic demyelination. Vascular: No hyperdense vessel or unexpected  calcification. Skull: No evidence for fracture. No worrisome lytic or sclerotic lesion. Sinuses/Orbits: Left mastoid effusion. Visualized portions of the globes and intraorbital fat are unremarkable. Other: None. IMPRESSION: 1. No acute intracranial abnormality. 2. Patchy low attenuation in the deep hemispheric and periventricular white matter is nonspecific, but likely reflects chronic microvascular ischemic demyelination. 3. Left mastoid effusion. Electronically Signed   By: Kennith Center M.D.   On: 02/11/2023 13:06   US Paracentesis  Result Date: 02/01/2023 INDICATION: History of cirrhosis and recurrent ascites. EXAM: ULTRASOUND GUIDED DIAGNOSTIC AND THERAPEUTIC PARACENTESIS MEDICATIONS: None. COMPLICATIONS: None immediate. PROCEDURE: Informed written consent was obtained from the patient after a discussion of the risks, benefits and alternatives to treatment. A timeout was performed prior to the initiation of the procedure. Initial ultrasound scanning demonstrates a moderate amount of ascites within the right lower abdominal quadrant. The right lower abdomen was prepped and draped in the usual sterile fashion. 1% lidocaine was used for local anesthesia. Following this, a 19 gauge, 7-cm, Yueh catheter was introduced. An ultrasound image was saved for documentation purposes. The paracentesis was performed. The catheter was removed and a dressing was applied. The patient tolerated the procedure well without immediate post procedural complication. FINDINGS: A total of approximately 2.7 L of hazy yellow fluid was removed. Samples were sent to the laboratory as requested by the clinical team. IMPRESSION: Successful ultrasound-guided paracentesis yielding 2.7 liters of peritoneal fluid. Electronically Signed   By: Obie Dredge M.D.   On: 02/01/2023 13:51   US Paracentesis  Result Date: 01/26/2023 INDICATION: Patient with history of  cirrhosis, recurrent ascites. Request for diagnostic and therapeutic  paracentesis. EXAM: ULTRASOUND GUIDED DIAGNOSTIC AND THERAPEUTIC PARACENTESIS MEDICATIONS: 6 mL 1% lidocaine. COMPLICATIONS: None immediate. PROCEDURE: Informed written consent was obtained from the patient after a discussion of the risks, benefits and alternatives to treatment. A timeout was performed prior to the initiation of the procedure. Initial ultrasound scanning demonstrates a large amount of ascites within the right lower abdominal quadrant. The right lower abdomen was prepped and draped in the usual sterile fashion. 1% lidocaine was used for local anesthesia. Following this, a 19 gauge, 7-cm, Yueh catheter was introduced. An ultrasound image was saved for documentation purposes. The paracentesis was performed. The catheter was removed and a dressing was applied. The patient tolerated the procedure well without immediate post procedural complication. Patient received post-procedure intravenous albumin; see nursing notes for details. FINDINGS: A total of approximately 3.2 L of hazy yellow fluid was removed. Samples were sent to the laboratory as requested by the clinical team. IMPRESSION: Successful ultrasound-guided paracentesis yielding 3.2 liters of peritoneal fluid. Performed by [name], PA-C PLAN: The patient has previously been evaluated by the [facility], and deemed not a candidate for intervention. Electronically Signed   By: Simonne Come M.D.   On: 01/26/2023 11:42    Assessment  62 y.o. female with a history of decompensated cirrhosis secondary to hepatitis C (s/p treatment x 2 in 2015 and 2022 with eradication), alcohol use with last drink in November 2023.  Course has been complicated by ascites, esophageal varices, and hepatic encephalopathy as well as acute renal failure in the setting of multiple paracentesis for recurrent ascites.  Hepatic encephalopathy: Improved.  Patient oriented x 4.  On lactulose 30 g 3 times daily and  Linzess 145 mcg daily.  Now having bowel movements.  On Xifaxan 550 mg twice daily.  Cirrhosis: MELD 3.0 up to 29 today.  INR slightly elevated.  Ultrasound liver Doppler without portal venous thrombosis.  Recent CT scan without any evidence of hepatic lesions.  Has been unable to tolerate up titration of diuretics due to renal function.  Resumed Lasix 20 mg and Aldactone 50 mg daily.  Continues to need frequent monitoring of renal function.  Creatinine elevated in the 2 range on admission, is now down to 1.14.  Paracentesis on 7/8 with SBP.  Repeat paracentesis yesterday 7/11 with improvement in ANC.  Recent EGD in January 2024 with grade 1 esophageal varices and portal hypertensive gastropathy.  Deemed to not be a candidate for TIPS procedure due to hide MELD.  Has been undergoing transplant evaluation with [facility].  Lewie Loron, NP has discussed with [facility] to update on patient's status earlier this week.  Appointment scheduled this week originally however daughter rescheduled this for Friday next week.   SBP: Paracentesis on 7/8 consistent with SBP.  Started on Rocephin on 7/8 as well as IV albumin per protocol.  Advised at least 5 days of IV antibiotics.  Culture grew Enterobacter Cloacae was sensitive to cefepime and ceftazidime.  Was transitioned to IV cefepime yesterday.  Given ongoing ascites and discomfort she underwent repeat paracentesis yesterday 7/11 and as stated above she has had improvement in ANC.  WBC spike to 14 today therefore discharge on hold in order to monitor for any improvement or worsening tomorrow.  Plan / Recommendations  CBC, CMP, and INR tomorrow Continue Xifaxan 550 mg twice daily Titrate lactulose for 3 soft bowel movements daily Continue Linzess Continue IV cefepime while inpatient,  can transition to Cipro outpatient with at least 7 days of therapy.  Would likely benefit from ongoing SBP prophylaxis with Cipro after completing treatment dose. Continued  evaluation with [facility], has appointment next Friday 7/19 2 g sodium diet Plan for repeat labs early next week, Monday or Tuesday.  Will need office visit for hospital follow-up in 2 weeks.    LOS: 7 days    02/18/2023, 3:47 PM   Brooke Bonito, MSN, FNP-BC, AGACNP-BC [facility]

## 2023-02-18 NOTE — Telephone Encounter (Signed)
Lab orders put in and left message to return call to discuss with patient.

## 2023-02-19 ENCOUNTER — Inpatient Hospital Stay (HOSPITAL_COMMUNITY): Payer: 59

## 2023-02-19 DIAGNOSIS — K7682 Hepatic encephalopathy: Secondary | ICD-10-CM | POA: Diagnosis not present

## 2023-02-19 DIAGNOSIS — K7031 Alcoholic cirrhosis of liver with ascites: Secondary | ICD-10-CM | POA: Diagnosis not present

## 2023-02-19 DIAGNOSIS — K652 Spontaneous bacterial peritonitis: Secondary | ICD-10-CM | POA: Diagnosis not present

## 2023-02-19 LAB — CBC
HCT: 27.3 % — ABNORMAL LOW (ref 36.0–46.0)
Hemoglobin: 9.4 g/dL — ABNORMAL LOW (ref 12.0–15.0)
MCH: 30.5 pg (ref 26.0–34.0)
MCHC: 34.4 g/dL (ref 30.0–36.0)
MCV: 88.6 fL (ref 80.0–100.0)
Platelets: 104 10*3/uL — ABNORMAL LOW (ref 150–400)
RBC: 3.08 MIL/uL — ABNORMAL LOW (ref 3.87–5.11)
RDW: 18.7 % — ABNORMAL HIGH (ref 11.5–15.5)
WBC: 18.2 10*3/uL — ABNORMAL HIGH (ref 4.0–10.5)
nRBC: 0.2 % (ref 0.0–0.2)

## 2023-02-19 LAB — COMPREHENSIVE METABOLIC PANEL
ALT: 27 U/L (ref 0–44)
AST: 39 U/L (ref 15–41)
Albumin: 3 g/dL — ABNORMAL LOW (ref 3.5–5.0)
Alkaline Phosphatase: 66 U/L (ref 38–126)
Anion gap: 5 (ref 5–15)
BUN: 29 mg/dL — ABNORMAL HIGH (ref 8–23)
CO2: 20 mmol/L — ABNORMAL LOW (ref 22–32)
Calcium: 8.5 mg/dL — ABNORMAL LOW (ref 8.9–10.3)
Chloride: 101 mmol/L (ref 98–111)
Creatinine, Ser: 1.02 mg/dL — ABNORMAL HIGH (ref 0.44–1.00)
GFR, Estimated: >60 mL/min (ref >60)
Glucose, Bld: 109 mg/dL — ABNORMAL HIGH (ref 70–99)
Potassium: 4.7 mmol/L (ref 3.5–5.1)
Sodium: 126 mmol/L — ABNORMAL LOW (ref 135–145)
Total Bilirubin: 5.1 mg/dL — ABNORMAL HIGH (ref 0.3–1.2)
Total Protein: 5.6 g/dL — ABNORMAL LOW (ref 6.5–8.1)

## 2023-02-19 LAB — URINALYSIS, ROUTINE W REFLEX MICROSCOPIC
Bacteria, UA: NONE SEEN
Bilirubin Urine: NEGATIVE
Glucose, UA: NEGATIVE mg/dL
Hgb urine dipstick: NEGATIVE
Ketones, ur: NEGATIVE mg/dL
Nitrite: NEGATIVE
Protein, ur: NEGATIVE mg/dL
Specific Gravity, Urine: 1.018 (ref 1.005–1.030)
pH: 5 (ref 5.0–8.0)

## 2023-02-19 LAB — PROTIME-INR
INR: 2 — ABNORMAL HIGH (ref 0.8–1.2)
Prothrombin Time: 23.3 seconds — ABNORMAL HIGH (ref 11.4–15.2)

## 2023-02-19 MED ORDER — PHYTONADIONE 5 MG PO TABS
5.0000 mg | ORAL_TABLET | Freq: Once | ORAL | Status: AC
  Administered 2023-02-19: 5 mg via ORAL
  Filled 2023-02-19: qty 1

## 2023-02-19 MED ORDER — IBUPROFEN 400 MG PO TABS
400.0000 mg | ORAL_TABLET | Freq: Once | ORAL | Status: AC
  Administered 2023-02-19: 400 mg via ORAL
  Filled 2023-02-19: qty 1

## 2023-02-19 NOTE — Plan of Care (Signed)
°  Problem: Health Behavior/Discharge Planning: °Goal: Ability to manage health-related needs will improve °Outcome: Progressing °  °Problem: Clinical Measurements: °Goal: Ability to maintain clinical measurements within normal limits will improve °Outcome: Progressing °Goal: Will remain free from infection °Outcome: Progressing °Goal: Diagnostic test results will improve °Outcome: Progressing °  °Problem: Activity: °Goal: Risk for activity intolerance will decrease °Outcome: Progressing °  °Problem: Nutrition: °Goal: Adequate nutrition will be maintained °Outcome: Progressing °  °Problem: Coping: °Goal: Level of anxiety will decrease °Outcome: Progressing °  °Problem: Elimination: °Goal: Will not experience complications related to bowel motility °Outcome: Progressing °Goal: Will not experience complications related to urinary retention °Outcome: Progressing °  °Problem: Pain Managment: °Goal: General experience of comfort will improve °Outcome: Progressing °  °Problem: Safety: °Goal: Ability to remain free from injury will improve °Outcome: Progressing °  °Problem: Skin Integrity: °Goal: Risk for impaired skin integrity will decrease °Outcome: Progressing °  °

## 2023-02-19 NOTE — Progress Notes (Signed)
Pt refused 2200 dose of Lactulose. Vitals stable. PRN Atarax given for anxiety and restlessness.

## 2023-02-19 NOTE — Progress Notes (Signed)
Progress Note  Patient: Karla Young ZOX:[id number] DOB: [birth year] DOA: 02/11/2023     8 DOS: the patient was seen and examined on 02/19/2023   Brief hospital admission narrative : As per H&P written by Dr. Ella Jubilee on 02/11/2023 Karla Young is a 62 y.o. female with medical history significant of cirrhosis, COPD, hypotension, hypothyroid, hepatitis C, anxiety and fibromyalgia who presented with altered mental status.  Patient currently is in a waiting list for liver transplant, she is getting outpatient paracentesis every week per GI recommendations. Last paracentesis 06.25.24 removing 2,7 L.  Yesterday afternoon patient was noted to be somnolent and less reactive. This morning when her family went to pick her up to bring her for outpatient paracentesis she was noted to be poorly responsive, very weak and deconditioned. Apparently she had falls at home that were unwitnessed.  Because of her severe change in mentation, EMS was called and she was transported to the ED.   Per her daughter, who is providing the history, it is possible that patient was not taking her medications or having adequate po intake over last 24 hrs.  Patient is not able to give any information due to somnolence.   Assessment and Plan: 1) Hepatic encephalopathy (HCC) -Likely multifactorial including dehydration, uremia from AKI and elevated ammonia level. -Continue  xifaxan as recommended by GI service. -Having BMs after increasing dose of lactulose on 02/16/2023 -Patient received treatment with midodrine and albumin; no hepatorenal syndrome presence demonstrated. -Mentation appears back to baseline for the most part after adjusting lactulose  2) Enterobacter cloacae SBP--- ascitic fluid culture from 02/14/2023 with Enterobacter cloacae -Repeat paracentesis with removal of 4 L of ascitic fluid on 02/17/2023---ascitic fluid studies are pending 02/19/23 --WBC 10.8 >>14.1 >>18.2 --Stopped IV Rocephin -Started IV cefepime on  02/17/23 -Previously discussed with ID physician Dr. Kara Mead -Discussed with GI physician Dr. Marletta Lor--- --- workup for infection due to persistent leukocytosis initiated  3)AKI (acute kidney injury) (HCC) -Most likely prerenal;  -Midodrine and albumin provided;   -Renal function has stabilized  -Avoid hypotension  4)Alcoholic liver cirrhosis with ascites----patient reports abstinence from alcohol since 06/2022 -Her liver failure is advanced.  -Actively following with [facility] transplant team to see if she is a candidate for possible transplant in the near future in [place]---she is not yet on the transplant list -Continue PPI and lactulose -Continue xifaxan -GI service consultation is appreciated; will follow any further input/recommendations. -Continue to hold diuretics;  -Repeat paracentesis on 02/17/2023 as above #2 INR 2.0--- patient received vitamin K  5)COPD (chronic obstructive pulmonary disease) (HCC) -No signs of exacerbation.  -Chest x-ray on 02/19/2023 without acute findings -Continue as needed bronchodilator management.  6)Hypothyroidism -TSH within normal limit -Continue Synthroid.  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   INR is 2.04  10)Leukocytosis------WBC 10.8 >>14.1 >>18.2 --Stopped IV Rocephin -Started IV cefepime on 02/17/23 -Previously discussed with ID physician Dr. Kara Mead -Discussed with GI physician Dr. Marletta Lor--- --- workup for infection due to persistent leukocytosis initiated Blood cx, urine cx and CXR pending...Marland KitchenMarland KitchenMarland Kitchen  Subjective:  - No fever  Or chills  Having BMs with lactulose... Oral intake is fair -No dysuria.... no abd pain , no flank pain  Physical Exam: Vitals:   02/18/23 0546 02/18/23 1356 02/18/23 2100 02/19/23 0550  BP: (!) 93/54 (!) 102/54 (!) 110/57  (!) 93/59  Pulse: 78 86 84 76  Resp: 16 16 20 20   Temp: 98.6 F (37 C) 98.4 F (36.9 C) 99.7 F (37.6 C) 98.3 F (36.8 C)  TempSrc: Oral Oral    SpO2: 100% 100% 99% 100%  Weight: 61.7 kg   63 kg  Height:       Physical Exam Gen:- Awake Alert, in no acute distress  HEENT:- [place].AT, +ve sclera icterus Neck-Supple Neck,No JVD,.  Lungs-  CTAB , fair air movement bilaterally  CV- S1, S2 normal, RRR Abd-  +ve B.Sounds, Abd Soft, No tenderness, improved abdominal distention after repeat paracentesis on 02/17/2023 Extremity/Skin:- No  edema, good pedal pulses  Psych-affect is appropriate, oriented x3 Neuro-generalized weakness, no new focal deficits, no tremors  Family Communication: I called and updated patient's daughter Alphonse Guild who is an [employer] fundraiser on 02/17/2023  and 02/18/23  Procedures:- Paracentensis 02/14/23---- 4 Liters removed--cx with Enterobacter Cloacae Paracentensis 02/17/23----4 Liters removed  Disposition:--- home with [facility] Status is: Inpatient Remains inpatient appropriate because: Continue acute IV therapy and management for acute kidney injury and concerns for SBP.   Planned Discharge Destination: Home  Author: Shon Hale, MD 02/19/2023 11:42 AM  For on call review [email or web address].

## 2023-02-19 NOTE — Progress Notes (Signed)
Subjective: Patient feeling about the same.  Denies any abdominal pain.  White count continues to rise to 18,000 today.  Objective: Vital signs in last 24 hours: Temp:  [98.3 F (36.8 C)-99.7 F (37.6 C)] 98.3 F (36.8 C) (07/13 0550) Pulse Rate:  [76-86] 76 (07/13 0550) Resp:  [16-20] 20 (07/13 0550) BP: (93-110)/(54-59) 93/59 (07/13 0550) SpO2:  [99 %-100 %] 100 % (07/13 0550) Weight:  [63 kg] 63 kg (07/13 0550) Last BM Date : 02/18/23 General:   Alert and oriented, pleasant Head:  Normocephalic and atraumatic. Eyes:  No icterus, sclera clear. Conjuctiva pink.  Abdomen:  Bowel sounds present, soft, non-tender, non-distended. No HSM or hernias noted. No rebound or guarding. No masses appreciated  Msk:  Symmetrical without gross deformities. Normal posture. Extremities:  Without clubbing or edema. Neurologic:  Alert and  oriented x4;  grossly normal neurologically. Skin:  Warm and dry, intact without significant lesions.  Cervical Nodes:  No significant cervical adenopathy. Psych:  Alert and cooperative. Normal mood and affect.  Intake/Output from previous day: 07/12 0701 - 07/13 0700 In: 720 [P.O.:720] Out: -  Intake/Output this shift: Total I/O In: 240 [P.O.:240] Out: -   Lab Results: Recent Labs    02/18/23 0433 02/19/23 0459  WBC 14.1* 18.2*  HGB 8.6* 9.4*  HCT 25.0* 27.3*  PLT 81* 104*   BMET Recent Labs    02/17/23 1122 02/18/23 0433 02/19/23 0459  NA 128* 128* 126*  K 4.2 4.0 4.7  CL 101 102 101  CO2 21* 20* 20*  GLUCOSE 126* 104* 109*  BUN 33* 31* 29*  CREATININE 1.07* 1.14* 1.02*  CALCIUM 8.9 8.5* 8.5*   LFT Recent Labs    02/17/23 1122 02/18/23 0433 02/19/23 0459  PROT 5.9* 5.3* 5.6*  ALBUMIN 3.3* 3.0* 3.0*  AST 43* 35 39  ALT 25 22 27   ALKPHOS 55 55 66  BILITOT 5.6* 4.6* 5.1*   PT/INR Recent Labs    02/18/23 0433 02/19/23 0459  LABPROT 26.0* 23.3*  INR 2.4* 2.0*   Hepatitis Panel No results for input(s): "HEPBSAG", "HCVAB",  "HEPAIGM", "HEPBIGM" in the last 72 hours.   Studies/Results: US Paracentesis  Result Date: 02/17/2023 INDICATION: Abdominal distension.  Ascites. EXAM: ULTRASOUND GUIDED right lower quadrant PARACENTESIS MEDICATIONS: None. COMPLICATIONS: None immediate. PROCEDURE: Informed written consent was obtained from the patient after a discussion of the risks, benefits and alternatives to treatment. A timeout was performed prior to the initiation of the procedure. Initial ultrasound scanning demonstrates a large amount of ascites within the right lower abdominal quadrant. The right lower abdomen was prepped and draped in the usual sterile fashion. 1% lidocaine was used for local anesthesia. Following this, a 19 gauge, 7-cm, Yueh catheter was introduced. An ultrasound image was saved for documentation purposes. The paracentesis was performed. The catheter was removed and a dressing was applied. The patient tolerated the procedure well without immediate post procedural complication. FINDINGS: A total of approximately 4.0 of yellow fluid was removed. Samples were sent to the laboratory as requested by the clinical team. IMPRESSION: Successful ultrasound-guided paracentesis yielding 4.0 liters of peritoneal fluid. Electronically Signed   By: Marin Roberts M.D.   On: 02/17/2023 12:42    Assessment: *Decompensated alcoholic cirrhosis *Spontaneous bacterial peritonitis *Encephalopathy-improved  Plan: Unfortunately her white count continues to rise to 18,000 today.  Discussed case with hospitalist, agree with infectious workup.  Repeat paracentesis with improving PMNs which is reassuring continue on IV cefepime.  Continue Xifaxan and lactulose.  Continue  daily MELD labs.  Will need oral ciprofloxacin for prophylaxis going forward once stable for discharge.  Hennie Duos. Marletta Lor, D.O. Gastroenterology and Hepatology [facility]   LOS: 8 days    02/19/2023, 10:33 AM

## 2023-02-19 NOTE — TOC Progression Note (Signed)
Transition of Care [facility]) - Progression Note    Patient Details  Name: Karla Young MRN: [id number] Date of Birth: 1961/02/21  Transition of Care [facility]) CM/SW Contact  Catalina Gravel, LCSW Phone Number: 02/19/2023, 11:33 AM  Clinical Narrative:     Continue medical treatment. DC ready in 1-2 days.  TOC to continue to follow.     Barriers to Discharge: Continued Medical Work up  Expected Discharge Plan and Services                                   HH Arranged: RN, Nurse's Aide, PT   Date [facility] Agency Contacted: 02/18/23 Time HH Agency Contacted: [id number] Representative spoke with at [facility] Agency: Clifton Custard   Social Determinants of Health (SDOH) Interventions SDOH Screenings   Food Insecurity: No Food Insecurity (02/11/2023)  Housing: Low Risk  (02/11/2023)  Transportation Needs: No Transportation Needs (02/11/2023)  Utilities: Not At Risk (02/11/2023)  Depression (PHQ2-9): Low Risk  (08/26/2022)  Financial Resource Strain: Low Risk  (08/03/2022)   Received from [facility]  Social Connections: Unknown (08/03/2022)   Received from [facility]  Stress: No Stress Concern Present (08/03/2022)   Received from [facility]  Tobacco Use: Medium Risk (02/11/2023)    Readmission Risk Interventions    08/06/2021   11:05 AM  Readmission Risk Prevention Plan  Medication Screening Complete  Transportation Screening Complete

## 2023-02-20 DIAGNOSIS — K652 Spontaneous bacterial peritonitis: Secondary | ICD-10-CM | POA: Diagnosis not present

## 2023-02-20 DIAGNOSIS — K7031 Alcoholic cirrhosis of liver with ascites: Secondary | ICD-10-CM | POA: Diagnosis not present

## 2023-02-20 DIAGNOSIS — K7682 Hepatic encephalopathy: Secondary | ICD-10-CM | POA: Diagnosis not present

## 2023-02-20 LAB — COMPREHENSIVE METABOLIC PANEL
ALT: 25 U/L (ref 0–44)
AST: 35 U/L (ref 15–41)
Albumin: 2.9 g/dL — ABNORMAL LOW (ref 3.5–5.0)
Alkaline Phosphatase: 79 U/L (ref 38–126)
Anion gap: 6 (ref 5–15)
BUN: 39 mg/dL — ABNORMAL HIGH (ref 8–23)
CO2: 19 mmol/L — ABNORMAL LOW (ref 22–32)
Calcium: 8.6 mg/dL — ABNORMAL LOW (ref 8.9–10.3)
Chloride: 101 mmol/L (ref 98–111)
Creatinine, Ser: 1.69 mg/dL — ABNORMAL HIGH (ref 0.44–1.00)
GFR, Estimated: 34 mL/min — ABNORMAL LOW (ref >60)
Glucose, Bld: 116 mg/dL — ABNORMAL HIGH (ref 70–99)
Potassium: 4.2 mmol/L (ref 3.5–5.1)
Sodium: 126 mmol/L — ABNORMAL LOW (ref 135–145)
Total Bilirubin: 5.4 mg/dL — ABNORMAL HIGH (ref 0.3–1.2)
Total Protein: 5.8 g/dL — ABNORMAL LOW (ref 6.5–8.1)

## 2023-02-20 LAB — CBC WITH DIFFERENTIAL/PLATELET
Abs Immature Granulocytes: 0.49 10*3/uL — ABNORMAL HIGH (ref 0.00–0.07)
Basophils Absolute: 0.1 10*3/uL (ref 0.0–0.1)
Basophils Relative: 1 %
Eosinophils Absolute: 0.6 10*3/uL — ABNORMAL HIGH (ref 0.0–0.5)
Eosinophils Relative: 4 %
HCT: 28.1 % — ABNORMAL LOW (ref 36.0–46.0)
Hemoglobin: 9.5 g/dL — ABNORMAL LOW (ref 12.0–15.0)
Immature Granulocytes: 3 %
Lymphocytes Relative: 8 %
Lymphs Abs: 1.3 10*3/uL (ref 0.7–4.0)
MCH: 30.5 pg (ref 26.0–34.0)
MCHC: 33.8 g/dL (ref 30.0–36.0)
MCV: 90.4 fL (ref 80.0–100.0)
Monocytes Absolute: 2 10*3/uL — ABNORMAL HIGH (ref 0.1–1.0)
Monocytes Relative: 12 %
Neutro Abs: 12.5 10*3/uL — ABNORMAL HIGH (ref 1.7–7.7)
Neutrophils Relative %: 72 %
Platelets: 104 10*3/uL — ABNORMAL LOW (ref 150–400)
RBC: 3.11 MIL/uL — ABNORMAL LOW (ref 3.87–5.11)
RDW: 19.2 % — ABNORMAL HIGH (ref 11.5–15.5)
WBC: 17.1 10*3/uL — ABNORMAL HIGH (ref 4.0–10.5)
nRBC: 0.2 % (ref 0.0–0.2)

## 2023-02-20 LAB — PROTIME-INR
INR: 2.1 — ABNORMAL HIGH (ref 0.8–1.2)
Prothrombin Time: 24.1 seconds — ABNORMAL HIGH (ref 11.4–15.2)

## 2023-02-20 MED ORDER — LACTULOSE 10 GM/15ML PO SOLN: 10.0000 g | Freq: Two times a day (BID) | ORAL | Status: DC

## 2023-02-20 MED ORDER — ALBUMIN HUMAN 25 % IV SOLN
12.5000 g | Freq: Once | INTRAVENOUS | Status: AC
  Administered 2023-02-20: 12.5 g via INTRAVENOUS
  Filled 2023-02-20: qty 50

## 2023-02-20 MED ORDER — MIDODRINE HCL 5 MG PO TABS
10.0000 mg | ORAL_TABLET | Freq: Three times a day (TID) | ORAL | Status: DC
  Administered 2023-02-20 – 2023-02-21 (×5): 10 mg via ORAL
  Filled 2023-02-20 (×5): qty 2

## 2023-02-20 MED ORDER — PHYTONADIONE 5 MG PO TABS
5.0000 mg | ORAL_TABLET | Freq: Once | ORAL | Status: AC
  Administered 2023-02-20: 5 mg via ORAL
  Filled 2023-02-20: qty 1

## 2023-02-20 MED ORDER — ALBUMIN HUMAN 25 % IV SOLN
25.0000 g | Freq: Once | INTRAVENOUS | Status: AC
  Administered 2023-02-20: 25 g via INTRAVENOUS
  Filled 2023-02-20: qty 100

## 2023-02-20 MED ORDER — MIDODRINE HCL 5 MG PO TABS: 10.0000 mg | ORAL_TABLET | Freq: Two times a day (BID) | ORAL | Status: DC

## 2023-02-20 MED ORDER — TRAZODONE HCL 50 MG PO TABS
100.0000 mg | ORAL_TABLET | Freq: Every day | ORAL | Status: DC
  Administered 2023-02-20 – 2023-02-21 (×2): 100 mg via ORAL
  Filled 2023-02-20 (×2): qty 2

## 2023-02-20 NOTE — Progress Notes (Addendum)
   02/20/23 0554  [place] Work Intensity Score (Update with each assessment and as needed)  Work Intensity Score (Level) 3  Level 3 Intensity B.Frequent assistance requests (patient or family)   Pt cried multiple times during the night d/t reported lack of sleep and pain. Pt agitated and irritable during the night. MD notified of severe pain and that she cannot have narcotics because of possible liver transplant. Ibuprofen given per order. Pt yelled out into hallway, upon entry pt took gown and telemetry box off and threw them in the floor and stated "I am ready to go." Telemetry put on standby. Pt's daughter contacted to try to calm pt down with some success.  Pt gets up multiple times during the night after receiving lactulose at 2200. Pharmacy contacted to see if it is possible to switch times so pt can get rest during the night and possibly have a positive impact on her mood.   Daughter would like to speak with doctor today and is requesting to be called when possible.

## 2023-02-20 NOTE — Progress Notes (Addendum)
Progress Note  Patient: Karla Young WUJ:[id number] DOB: 11/09/1960 DOA: 02/11/2023     9 DOS: the patient was seen and examined on 02/20/2023   Brief hospital admission narrative : As per H&P written by Dr. Ella Jubilee on 02/11/2023 Karla Young is a 62 y.o. female with medical history significant of cirrhosis, COPD, hypotension, hypothyroid, hepatitis C, anxiety and fibromyalgia who presented with altered mental status.  Patient currently is in a waiting list for liver transplant, she is getting outpatient paracentesis every week per GI recommendations. Last paracentesis 06.25.24 removing 2,7 L.  Yesterday afternoon patient was noted to be somnolent and less reactive. This morning when her family went to pick her up to bring her for outpatient paracentesis she was noted to be poorly responsive, very weak and deconditioned. Apparently she had falls at home that were unwitnessed.  Because of her severe change in mentation, EMS was called and she was transported to the ED.   Per her daughter, who is providing the history, it is possible that patient was not taking her medications or having adequate po intake over last 24 hrs.  Patient is not able to give any information due to somnolence.   Assessment and Plan: 1) Hepatic encephalopathy (HCC) -Likely multifactorial including dehydration, uremia from AKI and elevated ammonia level. -Continue  xifaxan as recommended by GI service. -Patient received treatment with midodrine and albumin; no hepatorenal syndrome presence demonstrated. -Having BMs with lactulose -Mentation appears back to baseline for the most part after adjusting lactulose  2) Enterobacter cloacae SBP--- ascitic fluid culture from 02/14/2023 with Enterobacter cloacae -Repeat paracentesis with removal of 4 L of ascitic fluid on 02/17/2023---ascitic fluid studies are pending 02/20/23 --WBC 10.8 >>14.1 >>18.2>>17.1 --Stopped IV Rocephin -Started IV cefepime on 02/17/23 -Previously discussed with  ID physician Dr. Kara Mead -Discussed with GI physician Dr. Marletta Lor--- ---  -Chest x-ray from 02/19/2023 without acute findings -Blood and urine cultures from 02/19/2023 [facility]D -May be able to transition to oral  Cipro at discharge  3)AKI (acute kidney injury) (HCC) -02/20/23 Multiple BMs over the last 24 hours -Transient hypotension noted with systolic blood pressure in the 80s and diastolic in the 50s -Creatinine is now back up to 1.69 from 1.0 the previous day  Most likely prerenal--due to GI losses compounded by Aldactone and Lasix use -Continue midodrine  -Give additional albumin  -Hold lactulose -Stop Aldactone and Lasix - renally adjust medications, avoid nephrotoxic agents / dehydration  / hypotension   4)Alcoholic liver cirrhosis with ascites----patient reports abstinence from alcohol since 06/2022 -Her liver failure is advanced.  -Actively following with [facility] transplant team to see if she is a candidate for possible transplant in the near future in [place]---she is not yet on the transplant list -Continue PPI and lactulose -Continue xifaxan -GI service consultation is appreciated; will follow any further input/recommendations. -Continue to hold diuretics;  -Repeat paracentesis on 02/17/2023 as above #2 INR 2.0--- patient received vitamin K  5)COPD (chronic obstructive pulmonary disease) (HCC) -No signs of exacerbation.  -Chest x-ray on 02/19/2023 without acute findings -Continue as needed bronchodilator management.  6)Hypothyroidism -TSH within normal limit -Continue Synthroid.  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   INR is 2.04  10)Leukocytosis------WBC 10.8 >>14.1 >>18.2>>17.1 --Stopped IV Rocephin -Started IV cefepime on 02/17/23 -Previously discussed with ID  physician Dr. Kara Mead -Discussed with GI physician Dr. Marletta Lor--- ---  workup for infection due to persistent leukocytosis initiated Blood cx, urine cx and CXR pending...Marland KitchenMarland KitchenMarland Kitchen  Subjective:  - -Lots of diarrhea overnight---with lactulose -Complains of worsening abdominal discomfort No fever  Or chills  -No nausea or  vomiting -Appetite is not great -  Physical Exam: Vitals:   02/19/23 2045 02/20/23 0435 02/20/23 0437 02/20/23 1413  BP: (!) 99/56  (!) 89/51 (!) 97/55  Pulse: 71  64 61  Resp: 20  20 16   Temp: 98 F (36.7 C)  97.6 F (36.4 C)   TempSrc: Oral  Oral   SpO2: 99%  100% 100%  Weight:  64 kg    Height:       Physical Exam Gen:- Awake Alert, in no acute distress  HEENT:- .AT, +ve sclera icterus Neck-Supple Neck,No JVD,.  Lungs-  CTAB , fair air movement bilaterally  CV- S1, S2 normal, RRR, 3/6 SM Abd-  +ve B.Sounds, Abd Soft, increased in the abdominal distention, uncomfortable with palpation, no rebound or guarding Extremity/Skin:- No  edema, good pedal pulses  Psych-affect is appropriate, oriented x3 Neuro-generalized weakness, no new focal deficits, no tremors  Family Communication: I called and updated patient's daughter Alphonse Guild who is an [employer] fundraiser on 02/17/2023  and 02/18/23, 02/19/23 and on 02/20/23  Procedures:- Paracentensis 02/14/23---- 4 Liters removed--cx with Enterobacter Cloacae Paracentensis 02/17/23----4 Liters removed  Disposition:--- home with [facility] Status is: Inpatient Remains inpatient appropriate because: Continue acute IV therapy and management for acute kidney injury and concerns for SBP.   Planned Discharge Destination: Home  Author: Shon Hale, MD 02/20/2023 2:43 PM  For on call review [email or web address].

## 2023-02-20 NOTE — Progress Notes (Signed)
Subjective: Patient reports having a very rough night unable to sleep due to pain.  Reportedly cried multiple times during the night.  Was given ibuprofen which did not help.  Objective: Vital signs in last 24 hours: Temp:  [97.6 F (36.4 C)-98.3 F (36.8 C)] 97.6 F (36.4 C) (07/14 0437) Pulse Rate:  [64-80] 64 (07/14 0437) Resp:  [18-20] 20 (07/14 0437) BP: (89-101)/(51-58) 89/51 (07/14 0437) SpO2:  [99 %-100 %] 100 % (07/14 0437) Weight:  [64 kg] 64 kg (07/14 0435) Last BM Date : 02/18/23 General:   Alert and oriented, pleasant Head:  Normocephalic and atraumatic. Eyes:  No icterus, sclera clear. Conjuctiva pink.  Abdomen:  Bowel sounds present, soft, non-tender, non-distended. No HSM or hernias noted. No rebound or guarding. No masses appreciated  Msk:  Symmetrical without gross deformities. Normal posture. Extremities:  Without clubbing or edema. Neurologic:  Alert and  oriented x4;  grossly normal neurologically. Skin:  Warm and dry, intact without significant lesions.  Cervical Nodes:  No significant cervical adenopathy. Psych:  Alert and cooperative. Normal mood and affect.  Intake/Output from previous day: 07/13 0701 - 07/14 0700 In: 720 [P.O.:720] Out: -  Intake/Output this shift: Total I/O In: 240 [P.O.:240] Out: -   Lab Results: Recent Labs    02/18/23 0433 02/19/23 0459 02/20/23 0903  WBC 14.1* 18.2* 17.1*  HGB 8.6* 9.4* 9.5*  HCT 25.0* 27.3* 28.1*  PLT 81* 104* 104*   BMET Recent Labs    02/18/23 0433 02/19/23 0459 02/20/23 0903  NA 128* 126* 126*  K 4.0 4.7 4.2  CL 102 101 101  CO2 20* 20* 19*  GLUCOSE 104* 109* 116*  BUN 31* 29* 39*  CREATININE 1.14* 1.02* 1.69*  CALCIUM 8.5* 8.5* 8.6*   LFT Recent Labs    02/18/23 0433 02/19/23 0459 02/20/23 0903  PROT 5.3* 5.6* 5.8*  ALBUMIN 3.0* 3.0* 2.9*  AST 35 39 35  ALT 22 27 25   ALKPHOS 55 66 79  BILITOT 4.6* 5.1* 5.4*   PT/INR Recent Labs    02/19/23 0459 02/20/23 0903  LABPROT  23.3* 24.1*  INR 2.0* 2.1*   Hepatitis Panel No results for input(s): "HEPBSAG", "HCVAB", "HEPAIGM", "HEPBIGM" in the last 72 hours.   Studies/Results: DG Chest 2 View  Result Date: 02/19/2023 CLINICAL DATA:  Dyspnea. EXAM: CHEST - 2 VIEW COMPARISON:  Chest x-ray dated Jan 01, 2023. FINDINGS: The heart size and mediastinal contours are within normal limits. Both lungs are clear. The visualized skeletal structures are unremarkable. IMPRESSION: No active cardiopulmonary disease. Electronically Signed   By: Obie Dredge M.D.   On: 02/19/2023 14:07    Assessment: *Decompensated alcoholic cirrhosis *Spontaneous bacterial peritonitis *Encephalopathy-improved  Plan: Unfortunately her WBC remains elevated Discussed case with hospitalist, agree with infectious workup.  Repeat paracentesis with improving PMNs which is reassuring continue on IV cefepime.  Worsening renal function today.  Hold diuretics.  Agree with albumin infusion.  Continue Xifaxan and lactulose.  Continue daily MELD labs.  Will need oral ciprofloxacin for prophylaxis going forward once stable for discharge.  In regards to pain control, I would absolutely avoid all NSAIDs in her.  Patient is adamant that she cannot receive opioid medications as it would remove her from transplant evaluation.  This is incorrect.  I did reach out to transplant hepatology team who states patient can indeed have opioid medications if needed for pain control and to avoid NSAIDs.  Discussed with hospitalist.  Hennie Duos. Marletta Lor, D.O. Gastroenterology and Hepatology  [facility]   LOS: 9 days    02/20/2023, 11:49 AM

## 2023-02-21 ENCOUNTER — Inpatient Hospital Stay (HOSPITAL_COMMUNITY): Payer: 59

## 2023-02-21 DIAGNOSIS — K7682 Hepatic encephalopathy: Secondary | ICD-10-CM | POA: Diagnosis not present

## 2023-02-21 LAB — HEPATIC FUNCTION PANEL
ALT: 21 U/L (ref 0–44)
AST: 31 U/L (ref 15–41)
Albumin: 3 g/dL — ABNORMAL LOW (ref 3.5–5.0)
Alkaline Phosphatase: 58 U/L (ref 38–126)
Bilirubin, Direct: 1.6 mg/dL — ABNORMAL HIGH (ref 0.0–0.2)
Indirect Bilirubin: 3.4 mg/dL — ABNORMAL HIGH (ref 0.3–0.9)
Total Bilirubin: 5 mg/dL — ABNORMAL HIGH (ref 0.3–1.2)
Total Protein: 5.7 g/dL — ABNORMAL LOW (ref 6.5–8.1)

## 2023-02-21 LAB — RENAL FUNCTION PANEL
Albumin: 2.9 g/dL — ABNORMAL LOW (ref 3.5–5.0)
Anion gap: 6 (ref 5–15)
BUN: 49 mg/dL — ABNORMAL HIGH (ref 8–23)
CO2: 18 mmol/L — ABNORMAL LOW (ref 22–32)
Calcium: 8.9 mg/dL (ref 8.9–10.3)
Chloride: 101 mmol/L (ref 98–111)
Creatinine, Ser: 2.36 mg/dL — ABNORMAL HIGH (ref 0.44–1.00)
GFR, Estimated: 23 mL/min — ABNORMAL LOW (ref >60)
Glucose, Bld: 80 mg/dL (ref 70–99)
Phosphorus: 4.4 mg/dL (ref 2.5–4.6)
Potassium: 5 mmol/L (ref 3.5–5.1)
Sodium: 125 mmol/L — ABNORMAL LOW (ref 135–145)

## 2023-02-21 LAB — CBC
HCT: 26.2 % — ABNORMAL LOW (ref 36.0–46.0)
Hemoglobin: 8.9 g/dL — ABNORMAL LOW (ref 12.0–15.0)
MCH: 30.4 pg (ref 26.0–34.0)
MCHC: 34 g/dL (ref 30.0–36.0)
MCV: 89.4 fL (ref 80.0–100.0)
Platelets: 91 10*3/uL — ABNORMAL LOW (ref 150–400)
RBC: 2.93 MIL/uL — ABNORMAL LOW (ref 3.87–5.11)
RDW: 18.8 % — ABNORMAL HIGH (ref 11.5–15.5)
WBC: 13.9 10*3/uL — ABNORMAL HIGH (ref 4.0–10.5)
nRBC: 0 % (ref 0.0–0.2)

## 2023-02-21 LAB — URINE CULTURE

## 2023-02-21 LAB — PROTIME-INR
INR: 2.2 — ABNORMAL HIGH (ref 0.8–1.2)
Prothrombin Time: 24.7 seconds — ABNORMAL HIGH (ref 11.4–15.2)

## 2023-02-21 MED ORDER — ACETAMINOPHEN 325 MG PO TABS
650.0000 mg | ORAL_TABLET | Freq: Once | ORAL | Status: AC
  Administered 2023-02-21: 650 mg via ORAL

## 2023-02-21 MED ORDER — IOHEXOL 9 MG/ML PO SOLN
ORAL | Status: AC
  Filled 2023-02-21: qty 1000

## 2023-02-21 MED ORDER — DARBEPOETIN ALFA 200 MCG/0.4ML IJ SOSY
200.0000 ug | PREFILLED_SYRINGE | INTRAMUSCULAR | Status: DC
  Administered 2023-02-21: 200 ug via SUBCUTANEOUS
  Filled 2023-02-21: qty 0.4

## 2023-02-21 MED ORDER — TRAMADOL HCL 50 MG PO TABS
50.0000 mg | ORAL_TABLET | Freq: Three times a day (TID) | ORAL | Status: DC | PRN
  Administered 2023-02-21 – 2023-02-22 (×2): 50 mg via ORAL
  Filled 2023-02-21 (×2): qty 1

## 2023-02-21 MED ORDER — ALBUMIN HUMAN 25 % IV SOLN
25.0000 g | Freq: Four times a day (QID) | INTRAVENOUS | Status: AC
  Administered 2023-02-21 – 2023-02-22 (×4): 25 g via INTRAVENOUS
  Filled 2023-02-21 (×4): qty 100

## 2023-02-21 MED ORDER — ENOXAPARIN SODIUM 30 MG/0.3ML IJ SOSY
30.0000 mg | PREFILLED_SYRINGE | INTRAMUSCULAR | Status: DC
  Administered 2023-02-21: 30 mg via SUBCUTANEOUS
  Filled 2023-02-21: qty 0.3

## 2023-02-21 MED ORDER — SODIUM CHLORIDE 0.9 % IV SOLN
2.0000 g | INTRAVENOUS | Status: DC
  Administered 2023-02-21: 2 g via INTRAVENOUS
  Filled 2023-02-21: qty 12.5

## 2023-02-21 NOTE — Consult Note (Signed)
[facility] Renal Consultation Note  Requesting MD: Emokpae Indication for Consultation: AKI  HPI:  Karla Young is a 62 y.o. female with COPD, cirrhosis and hep C ( looking into transplant as option-  req frequent paracentesis) , chronic hypotension on midodrine.  She was admitted on 7/5 for dec MS-  had AKI initially that resolved-  crt 0.8 on 7/9 and 1.07 on 7/11.   Events of hospitalization include having hepatic encephalopathy, SBP placed on abx -  paracentesis on 7/11.  We are consulted because over the last 48 hours there has been a change in crt from 1.02 to 2.36 today -  BP has been as low as 89/51 during that time frame and she did get at least one dose of ibuprofen. UOP has not been well recorded. In order to assist with the AKI-  lasix and aldactone have been held , also lactulose-  giving albumin.  Urine on 7/13 was negative for blood and protein-   Creat  Date/Time Value Ref Range Status  03/23/2022 07:25 AM 0.67 0.50 - 1.05 mg/dL Final  78/29/5621 30:86 AM 0.74 0.50 - 1.05 mg/dL Final  57/84/6962 95:28 AM 0.79 0.50 - 1.05 mg/dL Final  41/32/4401 02:72 PM 0.81 0.50 - 1.10 mg/dL Final   Creatinine, Ser  Date/Time Value Ref Range Status  02/21/2023 06:12 AM 2.36 (H) 0.44 - 1.00 mg/dL Final  53/66/4403 47:42 AM 1.69 (H) 0.44 - 1.00 mg/dL Final  59/56/3875 64:33 AM 1.02 (H) 0.44 - 1.00 mg/dL Final  29/51/8841 66:06 AM 1.14 (H) 0.44 - 1.00 mg/dL Final  30/16/0109 32:35 AM 1.07 (H) 0.44 - 1.00 mg/dL Final  57/32/2025 42:70 AM 1.03 (H) 0.44 - 1.00 mg/dL Final  62/37/6283 15:17 AM 0.83 0.44 - 1.00 mg/dL Final  61/60/7371 06:26 AM 1.06 (H) 0.44 - 1.00 mg/dL Final  94/85/4627 03:50 AM 1.42 (H) 0.44 - 1.00 mg/dL Final  09/38/1829 93:71 AM 1.95 (H) 0.44 - 1.00 mg/dL Final  69/67/8938 10:17 AM 2.56 (H) 0.44 - 1.00 mg/dL Final  51/09/5850 77:82 AM 0.87 0.44 - 1.00 mg/dL Final  42/35/3614 43:15 PM 0.97 0.44 - 1.00 mg/dL Final  40/03/6760 95:09 AM 0.81 0.44 - 1.00 mg/dL  Final  32/67/1245 80:99 AM 0.99 0.44 - 1.00 mg/dL Final  83/38/2505 39:76 PM 1.03 (H) 0.44 - 1.00 mg/dL Final  73/41/9379 02:40 AM 0.97 0.44 - 1.00 mg/dL Final  97/35/3299 24:26 AM 0.88 0.44 - 1.00 mg/dL Final  83/41/9622 29:79 AM 1.24 (H) 0.44 - 1.00 mg/dL Final  89/21/1941 74:08 AM 1.67 (H) 0.44 - 1.00 mg/dL Final  14/48/1856 31:49 PM 1.70 (H) 0.44 - 1.00 mg/dL Final  70/26/3785 88:50 AM 0.58 0.44 - 1.00 mg/dL Final  27/74/1287 86:76 AM 0.70 0.44 - 1.00 mg/dL Final  72/04/4708 62:83 AM 0.68 0.44 - 1.00 mg/dL Final  66/29/4765 46:50 PM 0.60 0.44 - 1.00 mg/dL Final  35/46/5681 27:51 AM 0.86 0.50 - 1.10 mg/dL Final  70/08/7492 49:67 AM 0.58  Final  09/07/2007 05:50 AM 0.71  Final  09/06/2007 12:09 PM 0.50  Final     PMHx:   Past Medical History:  Diagnosis Date   Anxiety    Cirrhosis (HCC)    COPD (chronic obstructive pulmonary disease) (HCC)    Fibromyalgia    GERD (gastroesophageal reflux disease)    Heart murmur    Hepatitis C    Hypertension    Hypotension    Hypothyroid    Migraines    MRSA (methicillin resistant staph aureus) culture positive  Past Surgical History:  Procedure Laterality Date   ABDOMINAL HYSTERECTOMY     partial   BACK SURGERY     lumbar removal of disc   BIOPSY  09/06/2014   Procedure: BIOPSY;  Surgeon: Malissa Hippo, MD;  Location: [facility];  Service: Endoscopy;;   CHOLECYSTECTOMY     COLONOSCOPY WITH PROPOFOL N/A 09/06/2014   Procedure: COLONOSCOPY WITH PROPOFOL (Procedure #2) At cecum at 0819; total withdrawal time=13 minutes;  Surgeon: Malissa Hippo, MD;  Location: [facility];  Service: Endoscopy;  Laterality: N/A;   COLONOSCOPY WITH PROPOFOL N/A 12/30/2022   Procedure: COLONOSCOPY WITH PROPOFOL;  Surgeon: Dolores Frame, MD;  Location: [facility];  Service: Gastroenterology;  Laterality: N/A;  11:00am;asa 3   ESOPHAGEAL DILATION N/A 09/06/2014   Procedure: ESOPHAGEAL DILATION WITH MALONEY DILATOR ;  Surgeon: Malissa Hippo, MD;  Location: [facility];  Service: Endoscopy;  Laterality: N/A;   ESOPHAGOGASTRODUODENOSCOPY (EGD) WITH PROPOFOL N/A 09/06/2014   Procedure: ESOPHAGOGASTRODUODENOSCOPY (EGD) WITH PROPOFOL (Procedure #1) ;  Surgeon: Malissa Hippo, MD;  Location: [facility];  Service: Endoscopy;  Laterality: N/A;   ESOPHAGOGASTRODUODENOSCOPY (EGD) WITH PROPOFOL N/A 09/02/2021   Procedure: ESOPHAGOGASTRODUODENOSCOPY (EGD) WITH PROPOFOL;  Surgeon: Dolores Frame, MD;  [facility]: [facility];  Service: Gastroenterology;  Laterality: N/A;  830   ESOPHAGOGASTRODUODENOSCOPY (EGD) WITH PROPOFOL N/A 09/02/2022   Procedure: ESOPHAGOGASTRODUODENOSCOPY (EGD) WITH PROPOFOL;  Surgeon: Dolores Frame, MD;  [facility]: [facility];  Service: Gastroenterology;  Laterality: N/A;  1030am, asa 3   HEMORRHOID SURGERY     Dr Vicenta Aly   HEMOSTASIS CLIP PLACEMENT  12/30/2022   Procedure: HEMOSTASIS CLIP PLACEMENT;  Surgeon: Dolores Frame, MD;  [facility]: [facility];  Service: Gastroenterology;;   IR RADIOLOGIST EVAL & MGMT  08/26/2022   IR RADIOLOGIST EVAL & MGMT  09/22/2022   PARTIAL HYSTERECTOMY      One ovary left   POLYPECTOMY  12/30/2022   Procedure: POLYPECTOMY;  Surgeon: Dolores Frame, MD;  [facility]: [facility];  Service: Gastroenterology;;   RECTAL PROLAPSE REPAIR     WRIST SURGERY  1997    Family Hx:  Family History  Problem Relation Age of Onset   Colon cancer Neg Hx    Colon polyps Neg Hx     Social History:  reports that she has quit smoking. Her smoking use included cigarettes. She has a 7.5 pack-year smoking history. She has been exposed to tobacco smoke. She has never used smokeless tobacco. She reports that she does not currently use alcohol. She reports that she does not use drugs.  Allergies: No Known Allergies  Medications: Prior to Admission medications   Medication Sig Start Date End Date Taking? Authorizing Provider  famotidine (PEPCID) 20 MG  tablet Take 1 tablet (20 mg total) by mouth at bedtime. 09/13/22  Yes Gelene Mink, NP  folic acid (FOLVITE) 1 MG tablet Take 1 tablet (1 mg total) by mouth daily. 07/24/22  Yes Emokpae, Courage, MD  furosemide (LASIX) 40 MG tablet Take 0.5 tablets (20 mg total) by mouth daily. Patient taking differently: Take 20-40 mg by mouth daily. 09/02/22  Yes Dolores Frame, MD  hydrOXYzine (ATARAX) 25 MG tablet Take 1 tablet (25 mg total) by mouth every 8 (eight) hours as needed for anxiety or nausea. 07/23/22  Yes Emokpae, Courage, MD  lactulose (CHRONULAC) 10 GM/15ML solution Take 15 mLs (10 g total) by mouth 3 (three) times daily. Goal is to have 2  to 3 Mushy/Loose stools per Day 11/18/22  Yes Tiffany Kocher, PA-C  levothyroxine (SYNTHROID) 88 MCG tablet Take 1 tablet (88 mcg total) by mouth daily before breakfast. 07/23/22  Yes Emokpae, Courage, MD  linaclotide (LINZESS) 145 MCG CAPS capsule Take 145 mcg by mouth daily before breakfast.   Yes [provider]  midodrine (PROAMATINE) 5 MG tablet Take 5 mg by mouth daily as needed (for low BP).   Yes [provider]  Multiple Vitamins-Minerals (MULTIVITAMIN WITH MINERALS) tablet Take 1 tablet by mouth daily. 07/23/22 07/23/23 Yes Emokpae, Courage, MD  ondansetron (ZOFRAN) 4 MG tablet Take 1 tablet (4 mg total) by mouth every 6 (six) hours as needed for nausea. 08/30/22  Yes Gelene Mink, NP  pantoprazole (PROTONIX) 40 MG tablet Take 1 tablet (40 mg total) by mouth 2 (two) times daily. 09/13/22  Yes Gelene Mink, NP  spironolactone (ALDACTONE) 100 MG tablet Take 0.5 tablets (50 mg total) by mouth daily. Patient taking differently: Take 50-100 mg by mouth daily. 09/02/22  Yes Dolores Frame, MD  thiamine (VITAMIN B-1) 100 MG tablet Take 1 tablet (100 mg total) by mouth daily. 07/24/22  Yes Emokpae, Courage, MD  albuterol (PROVENTIL) (2.5 MG/3ML) 0.083% nebulizer solution Take 3 mLs (2.5 mg total) by nebulization every 2 (two)  hours as needed for wheezing. Patient not taking: Reported on 02/11/2023 07/23/22   Shon Hale, MD  XIFAXAN 550 MG TABS tablet Take 550 mg by mouth 2 (two) times daily.    [provider]    I have reviewed the patient's current medications.  Labs:  Results for orders placed or performed during the hospital encounter of 02/11/23 (from the past 48 hour(s))  Culture, blood (Routine X 2) w Reflex to ID Panel     Status: None (Preliminary result)   Collection Time: 02/19/23  8:46 AM   Specimen: BLOOD RIGHT FOREARM  Result Value Ref Range   Specimen Description      BLOOD RIGHT FOREARM BOTTLES DRAWN AEROBIC AND ANAEROBIC   Special Requests Blood Culture adequate volume    Culture      NO GROWTH 2 DAYS Performed at [facility], [address], [place], [place] [address]    Report Status PENDING   Culture, blood (Routine X 2) w Reflex to ID Panel     Status: None (Preliminary result)   Collection Time: 02/19/23  8:46 AM   Specimen: BLOOD LEFT HAND  Result Value Ref Range   Specimen Description      BLOOD LEFT HAND BOTTLES DRAWN AEROBIC AND ANAEROBIC   Special Requests Blood Culture adequate volume    Culture      NO GROWTH 2 DAYS Performed at [facility], [address], [place], [place] [address]    Report Status PENDING   CBC with Differential/Platelet     Status: Abnormal   Collection Time: 02/20/23  9:03 AM  Result Value Ref Range   WBC 17.1 (H) 4.0 - 10.5 K/uL   RBC 3.11 (L) 3.87 - 5.11 MIL/uL   Hemoglobin 9.5 (L) 12.0 - 15.0 g/dL   HCT 87.5 (L) 64.3 - 32.9 %   MCV 90.4 80.0 - 100.0 fL   MCH 30.5 26.0 - 34.0 pg   MCHC 33.8 30.0 - 36.0 g/dL   RDW 51.8 (H) 84.1 - 66.0 %   Platelets 104 (L) 150 - 400 K/uL    Comment: SPECIMEN CHECKED FOR CLOTS Immature Platelet Fraction may be clinically indicated, consider ordering  this additional test [id number] REPEATED TO VERIFY    nRBC 0.2 0.0 - 0.2 %   Neutrophils Relative % 72 %   Neutro Abs 12.5 (H) 1.7 - 7.7 K/uL    Lymphocytes Relative 8 %   Lymphs Abs 1.3 0.7 - 4.0 K/uL   Monocytes Relative 12 %   Monocytes Absolute 2.0 (H) 0.1 - 1.0 K/uL   Eosinophils Relative 4 %   Eosinophils Absolute 0.6 (H) 0.0 - 0.5 K/uL   Basophils Relative 1 %   Basophils Absolute 0.1 0.0 - 0.1 K/uL   WBC Morphology MORPHOLOGY UNREMARKABLE    RBC Morphology MORPHOLOGY UNREMARKABLE    Smear Review MORPHOLOGY UNREMARKABLE    Immature Granulocytes 3 %   Abs Immature Granulocytes 0.49 (H) 0.00 - 0.07 K/uL    Comment: Performed at [facility], [address] [place], [place] [address]  Comprehensive metabolic panel     Status: Abnormal   Collection Time: 02/20/23  9:03 AM  Result Value Ref Range   Sodium 126 (L) 135 - 145 mmol/L   Potassium 4.2 3.5 - 5.1 mmol/L   Chloride 101 98 - 111 mmol/L   CO2 19 (L) 22 - 32 mmol/L   Glucose, Bld 116 (H) 70 - 99 mg/dL    Comment: Glucose reference range applies only to samples taken after fasting for at least 8 hours.   BUN 39 (H) 8 - 23 mg/dL   Creatinine, Ser 1.19 (H) 0.44 - 1.00 mg/dL   Calcium 8.6 (L) 8.9 - 10.3 mg/dL   Total Protein 5.8 (L) 6.5 - 8.1 g/dL   Albumin 2.9 (L) 3.5 - 5.0 g/dL   AST 35 15 - 41 U/L   ALT 25 0 - 44 U/L   Alkaline Phosphatase 79 38 - 126 U/L   Total Bilirubin 5.4 (H) 0.3 - 1.2 mg/dL   GFR, Estimated 34 (L) >60 mL/min    Comment: (NOTE) Calculated using the CKD-EPI Creatinine Equation (2021)    Anion gap 6 5 - 15    Comment: Performed at [facility], [address] [place], [place] [address]  Protime-INR     Status: Abnormal   Collection Time: 02/20/23  9:03 AM  Result Value Ref Range   Prothrombin Time 24.1 (H) 11.4 - 15.2 seconds   INR 2.1 (H) 0.8 - 1.2    Comment: (NOTE) INR goal varies based on device and disease states. Performed at [facility], [address] [place], [place] [address]   Renal function panel     Status: Abnormal   Collection Time: 02/21/23  6:12 AM  Result Value Ref Range   Sodium 125 (L) 135 - 145 mmol/L    Potassium 5.0 3.5 - 5.1 mmol/L   Chloride 101 98 - 111 mmol/L   CO2 18 (L) 22 - 32 mmol/L   Glucose, Bld 80 70 - 99 mg/dL    Comment: Glucose reference range applies only to samples taken after fasting for at least 8 hours.   BUN 49 (H) 8 - 23 mg/dL   Creatinine, Ser 3.08 (H) 0.44 - 1.00 mg/dL   Calcium 8.9 8.9 - 65.7 mg/dL   Phosphorus 4.4 2.5 - 4.6 mg/dL   Albumin 2.9 (L) 3.5 - 5.0 g/dL   GFR, Estimated 23 (L) >60 mL/min    Comment: (NOTE) Calculated using the CKD-EPI Creatinine Equation (2021)    Anion gap 6 5 - 15    Comment: Performed at [facility], [address] [place], [place] [address]  CBC     Status: Abnormal   Collection Time: 02/21/23  6:12 AM  Result Value Ref Range   WBC 13.9 (H) 4.0 - 10.5 K/uL   RBC 2.93 (L) 3.87 - 5.11 MIL/uL   Hemoglobin 8.9 (L) 12.0 - 15.0 g/dL   HCT 82.9 (L) 56.2 - 13.0 %   MCV 89.4 80.0 - 100.0 fL   MCH 30.4 26.0 - 34.0 pg   MCHC 34.0 30.0 - 36.0 g/dL   RDW 86.5 (H) 78.4 - 69.6 %   Platelets 91 (L) 150 - 400 K/uL    Comment: SPECIMEN CHECKED FOR CLOTS Immature Platelet Fraction may be clinically indicated, consider ordering this additional test [id number] PLATELET COUNT CONFIRMED BY SMEAR    nRBC 0.0 0.0 - 0.2 %    Comment: Performed at [facility], [address], [place], [place] [address]  Protime-INR     Status: Abnormal   Collection Time: 02/21/23  6:12 AM  Result Value Ref Range   Prothrombin Time 24.7 (H) 11.4 - 15.2 seconds   INR 2.2 (H) 0.8 - 1.2    Comment: (NOTE) INR goal varies based on device and disease states. Performed at [facility], [address] [place], [place] [address]      ROS:  Review of systems not obtained due to patient factors. Some slight confusion  Physical Exam: Vitals:   02/20/23 1955 02/21/23 0640  BP: (!) 98/54 (!) 98/56  Pulse: (!) 57 72  Resp:    Temp:    SpO2: 100% 100%     General: thin but also ascites-   some mild confusion HEENT: PERRLA, EOMI, mucous membranes moist Neck:  no JVD Heart: RRR Lungs: mostly clear Abdomen: ascites-  diffusely tender but no guarding Extremities: min edema if any Skin: warm and dry Neuro: mild global confusion -  non focal   Assessment/Plan: 63 year old WF with severe cirrhosis ( MELD 33) and chronic hypotension now with AKI 1.Renal- differential would be pre renal /ATN vs HRS.  Meds adjusted for the former ( holding aldactone and lasix)-  the only change I will make is to give more albumin over the next 24 hours.  Will check a urine sodium-  have on midodrine and albumin but not yet octreotide for HRS-  urine sodium and the trend for crt tomorrow with help guide if we need to add that back on or give some volume  2. Hypertension/volume  - chronic hypotension and not volume overloaded at this time-  lasix /aldactone recently held-  will not add back any fluids as of yet-  giving albumin however 3. Cirrhosis-  severe-  GI on board 4. Anemia  - multifactorial-  not helping hemodynamics-  will give ESA    Cecille Aver 02/21/2023, 8:45 AM

## 2023-02-21 NOTE — Progress Notes (Signed)
Subjective: Reports she is still having pain in her RLQ, about 9/10. Reports some improvement after a BM.  Had 2 Bms yesterday. Loose. States abdomen is distended making it hard to breathe. No nausea, vomiting, brbpr, or melena. Feels mental status is ok. Denies confusion or foggy headedness, but reports she is still having a lot of shaking in her hands.   Objective: Vital signs in last 24 hours: Pulse Rate:  [57-72] 72 (07/15 0640) Resp:  [16] 16 (07/14 1413) BP: (97-98)/(54-56) 98/56 (07/15 0640) SpO2:  [100 %] 100 % (07/15 0640) Weight:  [64.9 kg] 64.9 kg (07/15 0500) Last BM Date : 02/20/23 General:   Alert and oriented, pleasant, NAD.  Head:  Normocephalic and atraumatic. Eyes:  + scleral icterus.   Abdomen:  Bowel sounds present, soft, distended, moderate TTP in RLQ, mild TTP in RUQ. No rebound or guarding. No masses appreciated  Extremities:  Without edema. Neurologic:  Alert and  oriented x4; + asterixis.  Psych:  Normal mood and affect.  Intake/Output from previous day: 07/14 0701 - 07/15 0700 In: 1220 [P.O.:720; IV Piggyback:500] Out: -  Intake/Output this shift: No intake/output data recorded.  Lab Results: Recent Labs    02/19/23 0459 02/20/23 0903  WBC 18.2* 17.1*  HGB 9.4* 9.5*  HCT 27.3* 28.1*  PLT 104* 104*   BMET Recent Labs    02/19/23 0459 02/20/23 0903 02/21/23 0612  NA 126* 126* 125*  K 4.7 4.2 5.0  CL 101 101 101  CO2 20* 19* 18*  GLUCOSE 109* 116* 80  BUN 29* 39* 49*  CREATININE 1.02* 1.69* 2.36*  CALCIUM 8.5* 8.6* 8.9   LFT Recent Labs    02/19/23 0459 02/20/23 0903 02/21/23 0612  PROT 5.6* 5.8*  --   ALBUMIN 3.0* 2.9* 2.9*  AST 39 35  --   ALT 27 25  --   ALKPHOS 66 79  --   BILITOT 5.1* 5.4*  --    PT/INR Recent Labs    02/20/23 0903 02/21/23 0612  LABPROT 24.1* 24.7*  INR 2.1* 2.2*     Studies/Results: DG Chest 2 View  Result Date: 02/19/2023 CLINICAL DATA:  Dyspnea. EXAM: CHEST - 2 VIEW COMPARISON:  Chest  x-ray dated Jan 01, 2023. FINDINGS: The heart size and mediastinal contours are within normal limits. Both lungs are clear. The visualized skeletal structures are unremarkable. IMPRESSION: No active cardiopulmonary disease. Electronically Signed   By: Obie Dredge M.D.   On: 02/19/2023 14:07    Assessment: 62 y.o. female with a history of decompensated cirrhosis secondary to hepatitis C (s/p treatment x 2 in 2015 and 2022 with eradication), alcohol use with last drink in November 2023.  Course has been complicated by ascites, esophageal varices, coagulopathy. Now  admitted with worsening encephalopathy and acute renal failure in setting of multiple paras for recurrent ascites.   Hepatic encephalopathy:  Improving. Alert and Oriented x4 today, but still with asterixis. Has been on lactulose 30g TID and Xifaxan BID. Lactulose placed on hold yesterday evening due to reports of multiple BMs overnight and worsening Cr. Planning to resume tomorrow. Linzess also discontinued. Reports 2 total Bms yesterday. No BM so far this morning. If no BM by lunch time today, will need to go ahead and resume lactulose to prevent encephalopathy considering presence of asterixis.   Decompensated ETOH Cirrhosis:  MELD 3.0 was 33 yesterday. Will be worse today as Cr has increased, but no HFP on file to calculate. INR has also  been elevated. Has received 5 mg vitamin K daily for the last 3 days due to INR up to 2.4. Currently stable at 2.2.  Recent US liver doppler without PVT. No suspicious hepatic lesions on recent CT exam. Unable to tolerate titration of diuretics due to renal function. Outpatient lasix 20mg /aldactone 50mg  had been on hold due to AKI. Restarted 02/15/23, but again discontinued 7/14 due to worsening Cr. Paracentesis on 7/8 yielding 4L (max allowed) with SBP, management as per below. Repeat para 7/11 yielding 4L. Still with large volume ascites but holding off on repeat para today if possible due to worsening Cr.    Recent EGD in January 2024 with grade 1 esophageal varices and portal hypertensive gastropathy.  Deemed to not be a candidate for TIPS procedure due to hide MELD. Has been undergoing transplant evaluation with [facility] in [place]. Appt scheduled for 7/24.    SBP: Paracentesis on 7/8 consistent with SBP.  Started on Rocephin on 7/8 as well as IV albumin per protocol. Received additional Albumin on day 3. Culture grew Enterobacter Cloacae was sensitive to cefepime and ceftazidime.  Was transitioned to IV cefepime on 7/11. Paracentesis 7/11 with improving PMNs. WBC count increased to 18.2 on 7/13, unclear if this is related to SBP or other.  Cefepime continued. UA not consistent with infection, culture pending. CXR negative. WBC improved to 13.9 today.   Abdominal pain:  Appears patient started reporting abdominal pain on 7/14. Continues with 9/10 pain. Pain is right sided, primarily RLQ. Wouldn't expect SBP to cause such local pain. Will arrange non contrast CT for further evaluation.   AKI:  Cr 2.56 on admission. Diuretics held. Cr normalized on 7/9, so she was restarted on Lasix 20 mg and spironolactone 50 mg. Cr remained fairly stable until 7/14 when it bumped to 1.69. She did have hypotension with BP 89/51 on 7/14. Midodrine was increased. Lactulose was decreased and Linzess was discontinue due to reports of multiple Bms. Diuretics also discontinued. Received additional 25g IV albumin 7/14. Cr 2.36 today. Nephrology has been consulted.     Plan: HFP to re-calculate MELD. CT A/P with oral contrast only.   Consider paracentesis tomorrow if Cr improving for therapeutic and diagnostic purposes.  Continue to hold diuretics.  Recommend resuming lactulose this afternoon if patient is not having Bms today.  Continue Xifaxan 550 mg BID.  Continue cefepime.  Needs daily MELD labs.  Appreciate Nephrology assistance.  Will need oral ciprofloxacin for SBP prophylaxis once over acute  illness.  Continue with outpatient evaluation for liver transplant with [facility].    LOS: 10 days    02/21/2023, 7:47 AM   Ermalinda Memos, PA-C [facility]

## 2023-02-21 NOTE — Progress Notes (Signed)
Progress Note  Patient: Karla Young DDU:[id number] DOB: Sep 30, 1960 DOA: 02/11/2023     10 DOS: the patient was seen and examined on 02/21/2023   Brief hospital admission narrative : As per H&P written by Dr. Ella Jubilee on 02/11/2023 Karla Young is a 62 y.o. female with medical history significant of cirrhosis, COPD, hypotension, hypothyroid, hepatitis C, anxiety and fibromyalgia who presented with altered mental status.  Patient currently is in a waiting list for liver transplant, she is getting outpatient paracentesis every week per GI recommendations. Last paracentesis 06.25.24 removing 2,7 L.  Yesterday afternoon patient was noted to be somnolent and less reactive. This morning when her family went to pick her up to bring her for outpatient paracentesis she was noted to be poorly responsive, very weak and deconditioned. Apparently she had falls at home that were unwitnessed.  Because of her severe change in mentation, EMS was called and she was transported to the ED.   Per her daughter, who is providing the history, it is possible that patient was not taking her medications or having adequate po intake over last 24 hrs.  Patient is not able to give any information due to somnolence.   Assessment and Plan: 1) Hepatic encephalopathy (HCC) -Likely multifactorial including dehydration, uremia from AKI and elevated ammonia level. -Continue  xifaxan as recommended by GI service. -Patient received treatment with midodrine and albumin; no hepatorenal syndrome presence demonstrated. -Mentation is back to baseline after having BMs with lactulose 02/21/23 -Lactulose currently on hold  2) Enterobacter cloacae SBP--- ascitic fluid culture from 02/14/2023 with Enterobacter cloacae -Repeat paracentesis with removal of 4 L of ascitic fluid on 02/17/2023---ascitic fluid studies are pending 02/21/23 --WBC 10.8 >>14.1 >>18.2>>17.1>>13.9 --Stopped IV Rocephin -Started IV cefepime on 02/17/23 -Previously discussed with  ID physician Dr. Kara Mead -Discussed with GI physician Dr. Marletta Lor--- ---  -Chest x-ray from 02/19/2023 without acute findings -Blood and urine cultures from 02/19/2023 NGTD -May be able to transition to oral  Cipro at discharge  3)AKI (acute kidney injury) (HCC) -02/21/23 Multiple BMs over the last 24 hours -Transient hypotension noted with systolic blood pressure in the 80s and diastolic in the 50s -Creatinine 1.0 >>1.69 >> 2.36 Most likely prerenal--due to GI losses compounded by Aldactone and Lasix use -Continue midodrine  -Nephrology consult appreciated -Give additional albumin  -Hold lactulose -Stop Aldactone and Lasix - renally adjust medications, avoid nephrotoxic agents / dehydration  / hypotension   4)Alcoholic liver cirrhosis with ascites----patient reports abstinence from alcohol since 06/2022 -Her liver failure is advanced.  -Actively following with [facility] transplant team to see if she is a candidate for possible transplant in the near future in [place]---she is not yet on the transplant list -Continue PPI and lactulose -Continue xifaxan -GI service consultation is appreciated; will follow any further input/recommendations. -Continue to hold diuretics;  -Repeat paracentesis on 02/17/2023 as above #2 -INR is elevated,-- patient received vitamin K -CT abdomen and pelvis from 02/21/2023 as noted below #10  5)COPD (chronic obstructive pulmonary disease) (HCC) -No signs of exacerbation.  -Chest x-ray on 02/19/2023 without acute findings -Continue as needed bronchodilator management.  6)Hypothyroidism -TSH within normal limit -Continue Synthroid.  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   -Elevated INR noted  10)Leukocytosis------WBC 10.8 >>14.1 >>18.2>>17.1>.13.9 --Stopped  IV Rocephin -Started IV cefepime on 02/17/23 -Previously discussed with ID physician Dr. Kara Mead -Discussed  with GI physician Dr. Marletta Lor--- --- workup for infection due to persistent leukocytosis initiated Blood cx, urine cx NGTD  -Chest x-ray from 02/19/2023 without acute findings -CT abdomen and pelvis with oral contrast from 02/21/2023 with large ascites with omental congestion, possible rectal wall thickening and cirrhosis with splenomegaly  11)Hyponatremia--sodium currently remains in the mid 120 -Avoid excessive free water  12) chronic anemia--in the setting of liver cirrhosis  -Hgb currently above 8 -No active bleeding noted  Subjective:  - -Only 2 BMs in the last 16 hours No fever  Or chills  -No emesis -Oral intake is fair -  Physical Exam: Vitals:   02/20/23 1955 02/21/23 0500 02/21/23 0640 02/21/23 0903  BP: (!) 98/54  (!) 98/56 (!) 100/56  Pulse: (!) 57  72 73  Resp:      Temp:    97.9 F (36.6 C)  TempSrc:    Oral  SpO2: 100%  100% 100%  Weight:  64.9 kg    Height:       Physical Exam Gen:- Awake Alert, in no acute distress  HEENT:- Dent.AT, +ve sclera icterus Neck-Supple Neck,No JVD,.  Lungs-  CTAB , fair air movement bilaterally  CV- S1, S2 normal, RRR, 3/6 SM Abd-  +ve B.Sounds, Abd Soft, increased in the abdominal distention, uncomfortable with palpation, no rebound or guarding, no CVA area tenderness Extremity/Skin:- No  edema, good pedal pulses  Psych-affect is appropriate, oriented x3 Neuro-generalized weakness, no new focal deficits, no tremors  Family Communication: I called and updated patient's daughter Alphonse Guild who is an Char[employer] fundraiser on 02/17/2023  and 02/18/23, 02/19/23, on 02/20/23, 02/21/23  Procedures:- Paracentensis 02/14/23---- 4 Liters removed--cx with Enterobacter Cloacae Paracentensis 02/17/23----4 Liters removed  Disposition:--- home with [facility] Status is: Inpatient Remains inpatient appropriate because: Continue acute IV therapy and  management for acute kidney injury and concerns for SBP.   Planned Discharge Destination: Home  Author: Shon Hale, MD 02/21/2023 5:36 PM  For on call review [email or web address].

## 2023-02-21 NOTE — Progress Notes (Signed)
Mobility Specialist Progress Note:   02/21/23 1127  Mobility  Activity Ambulated with assistance to bathroom  Level of Assistance Contact guard assist, steadying assist  Assistive Device Front wheel walker  Distance Ambulated (ft) 10 ft  Range of Motion/Exercises Active;All extremities  Activity Response Tolerated well  Mobility Referral Yes  $Mobility charge 1 Mobility  Mobility Specialist Start Time (ACUTE ONLY) 1120  Mobility Specialist Stop Time (ACUTE ONLY) 1127  Mobility Specialist Time Calculation (min) (ACUTE ONLY) 7 min   Responded to bed alarm, pt received ambulating to bathroom with nurse via RW. CGA required for safety. Pt tolerated well, asx throughout. Returned pt to bed, all needs met, call bell in reach, NT in room.   Feliciana Rossetti Mobility Specialist Please contact via Special educational needs teacher or  Rehab office at [phone]

## 2023-02-21 NOTE — Telephone Encounter (Signed)
Pt currently still admitted to hospital.

## 2023-02-21 NOTE — Progress Notes (Signed)
Mobility Specialist Progress Note:   02/21/23 1018  Mobility  Activity Refused mobility  Mobility Referral No   Pt received in bed, refused mobility despite max encouragement. Left pt in bed, all needs met, call bell in reach.   Feliciana Rossetti Mobility Specialist Please contact via Special educational needs teacher or  Rehab office at [phone]

## 2023-02-22 ENCOUNTER — Inpatient Hospital Stay (HOSPITAL_COMMUNITY): Payer: 59

## 2023-02-22 ENCOUNTER — Encounter (HOSPITAL_COMMUNITY): Payer: Self-pay | Admitting: Internal Medicine

## 2023-02-22 DIAGNOSIS — B1920 Unspecified viral hepatitis C without hepatic coma: Secondary | ICD-10-CM | POA: Diagnosis not present

## 2023-02-22 DIAGNOSIS — K5909 Other constipation: Secondary | ICD-10-CM | POA: Diagnosis not present

## 2023-02-22 DIAGNOSIS — D6959 Other secondary thrombocytopenia: Secondary | ICD-10-CM | POA: Diagnosis not present

## 2023-02-22 DIAGNOSIS — G9340 Encephalopathy, unspecified: Secondary | ICD-10-CM | POA: Diagnosis not present

## 2023-02-22 DIAGNOSIS — F17210 Nicotine dependence, cigarettes, uncomplicated: Secondary | ICD-10-CM | POA: Diagnosis not present

## 2023-02-22 DIAGNOSIS — N179 Acute kidney failure, unspecified: Secondary | ICD-10-CM | POA: Diagnosis not present

## 2023-02-22 DIAGNOSIS — R188 Other ascites: Secondary | ICD-10-CM | POA: Diagnosis not present

## 2023-02-22 DIAGNOSIS — R159 Full incontinence of feces: Secondary | ICD-10-CM | POA: Diagnosis not present

## 2023-02-22 DIAGNOSIS — R4182 Altered mental status, unspecified: Secondary | ICD-10-CM | POA: Diagnosis not present

## 2023-02-22 DIAGNOSIS — G9341 Metabolic encephalopathy: Secondary | ICD-10-CM | POA: Diagnosis not present

## 2023-02-22 DIAGNOSIS — Z4682 Encounter for fitting and adjustment of non-vascular catheter: Secondary | ICD-10-CM | POA: Diagnosis not present

## 2023-02-22 DIAGNOSIS — R6521 Severe sepsis with septic shock: Secondary | ICD-10-CM | POA: Diagnosis not present

## 2023-02-22 DIAGNOSIS — J449 Chronic obstructive pulmonary disease, unspecified: Secondary | ICD-10-CM | POA: Diagnosis not present

## 2023-02-22 DIAGNOSIS — K652 Spontaneous bacterial peritonitis: Secondary | ICD-10-CM | POA: Diagnosis not present

## 2023-02-22 DIAGNOSIS — M797 Fibromyalgia: Secondary | ICD-10-CM | POA: Diagnosis not present

## 2023-02-22 DIAGNOSIS — I10 Essential (primary) hypertension: Secondary | ICD-10-CM | POA: Diagnosis not present

## 2023-02-22 DIAGNOSIS — R32 Unspecified urinary incontinence: Secondary | ICD-10-CM | POA: Diagnosis not present

## 2023-02-22 DIAGNOSIS — K7290 Hepatic failure, unspecified without coma: Secondary | ICD-10-CM | POA: Diagnosis not present

## 2023-02-22 DIAGNOSIS — R0902 Hypoxemia: Secondary | ICD-10-CM | POA: Diagnosis not present

## 2023-02-22 DIAGNOSIS — D638 Anemia in other chronic diseases classified elsewhere: Secondary | ICD-10-CM | POA: Diagnosis not present

## 2023-02-22 DIAGNOSIS — K767 Hepatorenal syndrome: Secondary | ICD-10-CM | POA: Diagnosis not present

## 2023-02-22 DIAGNOSIS — A419 Sepsis, unspecified organism: Secondary | ICD-10-CM | POA: Diagnosis not present

## 2023-02-22 DIAGNOSIS — K7460 Unspecified cirrhosis of liver: Secondary | ICD-10-CM | POA: Diagnosis not present

## 2023-02-22 DIAGNOSIS — D649 Anemia, unspecified: Secondary | ICD-10-CM | POA: Diagnosis not present

## 2023-02-22 DIAGNOSIS — F1010 Alcohol abuse, uncomplicated: Secondary | ICD-10-CM | POA: Diagnosis not present

## 2023-02-22 DIAGNOSIS — K219 Gastro-esophageal reflux disease without esophagitis: Secondary | ICD-10-CM | POA: Diagnosis not present

## 2023-02-22 DIAGNOSIS — Z79890 Hormone replacement therapy (postmenopausal): Secondary | ICD-10-CM | POA: Diagnosis not present

## 2023-02-22 DIAGNOSIS — I959 Hypotension, unspecified: Secondary | ICD-10-CM | POA: Diagnosis not present

## 2023-02-22 DIAGNOSIS — K7200 Acute and subacute hepatic failure without coma: Secondary | ICD-10-CM | POA: Diagnosis not present

## 2023-02-22 DIAGNOSIS — J9811 Atelectasis: Secondary | ICD-10-CM | POA: Diagnosis not present

## 2023-02-22 DIAGNOSIS — K766 Portal hypertension: Secondary | ICD-10-CM | POA: Diagnosis not present

## 2023-02-22 DIAGNOSIS — Z79899 Other long term (current) drug therapy: Secondary | ICD-10-CM | POA: Diagnosis not present

## 2023-02-22 DIAGNOSIS — E871 Hypo-osmolality and hyponatremia: Secondary | ICD-10-CM | POA: Diagnosis not present

## 2023-02-22 DIAGNOSIS — E039 Hypothyroidism, unspecified: Secondary | ICD-10-CM | POA: Diagnosis not present

## 2023-02-22 DIAGNOSIS — Z23 Encounter for immunization: Secondary | ICD-10-CM | POA: Diagnosis not present

## 2023-02-22 DIAGNOSIS — E873 Alkalosis: Secondary | ICD-10-CM | POA: Diagnosis not present

## 2023-02-22 DIAGNOSIS — R9431 Abnormal electrocardiogram [ECG] [EKG]: Secondary | ICD-10-CM | POA: Diagnosis not present

## 2023-02-22 DIAGNOSIS — K7031 Alcoholic cirrhosis of liver with ascites: Secondary | ICD-10-CM | POA: Diagnosis not present

## 2023-02-22 DIAGNOSIS — E860 Dehydration: Secondary | ICD-10-CM | POA: Diagnosis not present

## 2023-02-22 DIAGNOSIS — Z7682 Awaiting organ transplant status: Secondary | ICD-10-CM | POA: Diagnosis not present

## 2023-02-22 DIAGNOSIS — D696 Thrombocytopenia, unspecified: Secondary | ICD-10-CM | POA: Diagnosis not present

## 2023-02-22 DIAGNOSIS — D689 Coagulation defect, unspecified: Secondary | ICD-10-CM | POA: Diagnosis not present

## 2023-02-22 DIAGNOSIS — I2510 Atherosclerotic heart disease of native coronary artery without angina pectoris: Secondary | ICD-10-CM | POA: Diagnosis not present

## 2023-02-22 DIAGNOSIS — G253 Myoclonus: Secondary | ICD-10-CM | POA: Diagnosis not present

## 2023-02-22 DIAGNOSIS — K7682 Hepatic encephalopathy: Secondary | ICD-10-CM | POA: Diagnosis not present

## 2023-02-22 DIAGNOSIS — R579 Shock, unspecified: Secondary | ICD-10-CM | POA: Diagnosis not present

## 2023-02-22 DIAGNOSIS — E870 Hyperosmolality and hypernatremia: Secondary | ICD-10-CM | POA: Diagnosis not present

## 2023-02-22 DIAGNOSIS — G928 Other toxic encephalopathy: Secondary | ICD-10-CM | POA: Diagnosis not present

## 2023-02-22 DIAGNOSIS — K7210 Chronic hepatic failure without coma: Secondary | ICD-10-CM | POA: Diagnosis not present

## 2023-02-22 DIAGNOSIS — K7469 Other cirrhosis of liver: Secondary | ICD-10-CM | POA: Diagnosis not present

## 2023-02-22 LAB — HEPATIC FUNCTION PANEL
ALT: 18 U/L (ref 0–44)
AST: 24 U/L (ref 15–41)
Albumin: 3.5 g/dL (ref 3.5–5.0)
Alkaline Phosphatase: 51 U/L (ref 38–126)
Bilirubin, Direct: 1.6 mg/dL — ABNORMAL HIGH (ref 0.0–0.2)
Indirect Bilirubin: 3.5 mg/dL — ABNORMAL HIGH (ref 0.3–0.9)
Total Bilirubin: 5.1 mg/dL — ABNORMAL HIGH (ref 0.3–1.2)
Total Protein: 5.9 g/dL — ABNORMAL LOW (ref 6.5–8.1)

## 2023-02-22 LAB — CBC
HCT: 24.2 % — ABNORMAL LOW (ref 36.0–46.0)
Hemoglobin: 8.3 g/dL — ABNORMAL LOW (ref 12.0–15.0)
MCH: 30.9 pg (ref 26.0–34.0)
MCHC: 34.3 g/dL (ref 30.0–36.0)
MCV: 90 fL (ref 80.0–100.0)
Platelets: 97 10*3/uL — ABNORMAL LOW (ref 150–400)
RBC: 2.69 MIL/uL — ABNORMAL LOW (ref 3.87–5.11)
RDW: 18.9 % — ABNORMAL HIGH (ref 11.5–15.5)
WBC: 11.6 10*3/uL — ABNORMAL HIGH (ref 4.0–10.5)
nRBC: 0 % (ref 0.0–0.2)

## 2023-02-22 LAB — RENAL FUNCTION PANEL
Albumin: 3.5 g/dL (ref 3.5–5.0)
Anion gap: 8 (ref 5–15)
BUN: 62 mg/dL — ABNORMAL HIGH (ref 8–23)
CO2: 17 mmol/L — ABNORMAL LOW (ref 22–32)
Calcium: 9 mg/dL (ref 8.9–10.3)
Chloride: 100 mmol/L (ref 98–111)
Creatinine, Ser: 2.75 mg/dL — ABNORMAL HIGH (ref 0.44–1.00)
GFR, Estimated: 19 mL/min — ABNORMAL LOW (ref >60)
Glucose, Bld: 87 mg/dL (ref 70–99)
Phosphorus: 4.6 mg/dL (ref 2.5–4.6)
Potassium: 4.9 mmol/L (ref 3.5–5.1)
Sodium: 125 mmol/L — ABNORMAL LOW (ref 135–145)

## 2023-02-22 LAB — BODY FLUID CELL COUNT WITH DIFFERENTIAL
Eos, Fluid: 0 %
Lymphs, Fluid: 21 %
Monocyte-Macrophage-Serous Fluid: 5 % — ABNORMAL LOW (ref 50–90)
Neutrophil Count, Fluid: 74 % — ABNORMAL HIGH (ref 0–25)
Total Nucleated Cell Count, Fluid: 414 cu mm (ref 0–1000)

## 2023-02-22 LAB — GRAM STAIN: Gram Stain: NONE SEEN

## 2023-02-22 LAB — PROTIME-INR
INR: 2.3 — ABNORMAL HIGH (ref 0.8–1.2)
Prothrombin Time: 25.7 seconds — ABNORMAL HIGH (ref 11.4–15.2)

## 2023-02-22 MED ORDER — LACTULOSE ENEMA
300.0000 mL | Freq: Once | ORAL | Status: DC
  Filled 2023-02-22: qty 300

## 2023-02-22 MED ORDER — PANTOPRAZOLE SODIUM 40 MG IV SOLR
40.0000 mg | Freq: Two times a day (BID) | INTRAVENOUS | Status: DC
  Filled 2023-02-22: qty 10

## 2023-02-22 MED ORDER — VITAMIN K1 10 MG/ML IJ SOLN
5.0000 mg | Freq: Once | INTRAVENOUS | Status: AC
  Administered 2023-02-22: 5 mg via INTRAVENOUS
  Filled 2023-02-22: qty 0.5

## 2023-02-22 MED ORDER — LACTULOSE ENEMA
300.0000 mL | Freq: Three times a day (TID) | ORAL | Status: DC
  Administered 2023-02-22: 300 mL via RECTAL
  Filled 2023-02-22 (×4): qty 300

## 2023-02-22 MED ORDER — LACTULOSE 10 GM/15ML PO SOLN: 20.0000 g | Freq: Two times a day (BID) | ORAL | Status: DC

## 2023-02-22 MED ORDER — PANTOPRAZOLE SODIUM 40 MG IV SOLR: 40.0000 mg | Freq: Two times a day (BID) | INTRAVENOUS | Status: DC

## 2023-02-22 MED ORDER — DARBEPOETIN ALFA 200 MCG/0.4ML IJ SOSY: 200.0000 ug | PREFILLED_SYRINGE | INTRAMUSCULAR | Status: DC

## 2023-02-22 MED ORDER — OCTREOTIDE ACETATE 100 MCG/ML IJ SOLN: 100.0000 ug | Freq: Three times a day (TID) | INTRAMUSCULAR | 1 refills | Status: DC

## 2023-02-22 MED ORDER — LACTULOSE ENEMA: 300.0000 mL | Freq: Three times a day (TID) | ORAL | Status: DC

## 2023-02-22 MED ORDER — FUROSEMIDE 10 MG/ML IJ SOLN
40.0000 mg | Freq: Two times a day (BID) | INTRAMUSCULAR | Status: DC
  Administered 2023-02-22: 40 mg via INTRAVENOUS
  Filled 2023-02-22: qty 4

## 2023-02-22 MED ORDER — LACTULOSE ENEMA
300.0000 mL | Freq: Once | ORAL | Status: AC
  Administered 2023-02-22: 300 mL via RECTAL
  Filled 2023-02-22: qty 300

## 2023-02-22 MED ORDER — OCTREOTIDE ACETATE 100 MCG/ML IJ SOLN
100.0000 ug | Freq: Three times a day (TID) | INTRAMUSCULAR | Status: DC
  Administered 2023-02-22 (×2): 100 ug via SUBCUTANEOUS
  Filled 2023-02-22 (×5): qty 1

## 2023-02-22 MED ORDER — SODIUM CHLORIDE 0.9 % IV SOLN: 2.0000 g | INTRAVENOUS | Status: DC

## 2023-02-22 MED ORDER — PHYTONADIONE 5 MG PO TABS: 5.0000 mg | ORAL_TABLET | Freq: Once | ORAL | Status: DC

## 2023-02-22 NOTE — Discharge Instructions (Signed)
Transfer to [facility]--CMS-------Dr. Mikel Cella with the hepatology team at [facility]. They are happy to see patient in consultation once she is transferred. He requested to go ahead and check PETH test. This has been ordered.    Spoke with Dr. Tedra Senegal with ICU at [facility]. He has agreed for patient to be transferred. Stated there is an ICU bed available. The admitting doc will be Dr. Celesta Aver. Stated they will call our ICU once bed is ready.

## 2023-02-22 NOTE — Progress Notes (Addendum)
Mobility Specialist Progress Note:   02/22/23 1030  Mobility  Activity Transferred to/from [facility]  Level of Assistance Minimal assist, patient does 75% or more  Assistive Device None  Distance Ambulated (ft) 6 ft  Range of Motion/Exercises Active;All extremities  Activity Response Tolerated fair  Mobility Referral Yes  $Mobility charge 1 Mobility  Mobility Specialist Start Time (ACUTE ONLY) 1000  Mobility Specialist Stop Time (ACUTE ONLY) 1015  Mobility Specialist Time Calculation (min) (ACUTE ONLY) 15 min   Pt received sitting EOB, assisted nurse with transfer to [facility]. Pt had a recent decline in mobility and mentation. MinA, CGA, and HHA required to prevent LOB.Tolerated fairly, stool incontinent and tremors throughout. Assisted pt back to bed. Left pt in care of nursing staff, all needs met.   Feliciana Rossetti Mobility Specialist Please contact via Special educational needs teacher or  Rehab office at [phone]

## 2023-02-22 NOTE — Progress Notes (Signed)
[facility] able to make transfer happen today. ETA 15 mins away. MD, family and patient made aware.

## 2023-02-22 NOTE — Discharge Summary (Addendum)
Karla Young, is a 62 y.o. female  DOB 02-03-1961  MRN [id number].  Admission date:  02/11/2023  Admitting Physician  Mauricio Annett Gula, MD  Discharge Date:  02/22/2023   Primary MD  Wilmon Pali, FNP  Recommendations for primary care physician for things to follow:   Transfer to [facility]--CMS-------Dr. Mikel Cella with the hepatology team at [facility]. They are happy to see patient in consultation once she is transferred. He requested to go ahead and check PETH test. This has been ordered.    Spoke with Dr. Tedra Senegal with ICU at [facility]. He has agreed for patient to be transferred. Stated there is an ICU bed available. The admitting doc will be Dr. Celesta Aver. Stated they will call our ICU once bed is ready.   Admission Diagnosis  Acute metabolic encephalopathy [G93.41]   Discharge Diagnosis  Acute metabolic encephalopathy [G93.41]   Principal Problem:   Hepatic encephalopathy (HCC) Active Problems:   AKI (acute kidney injury) (HCC)   Cirrhosis of liver with ascites (HCC)   COPD (chronic obstructive pulmonary disease) (HCC)   Hypothyroidism   HTN (hypertension)   Thrombocytopenia (HCC)   SBP (spontaneous bacterial peritonitis) (HCC)      Past Medical History:  Diagnosis Date   Anxiety    Cirrhosis (HCC)    COPD (chronic obstructive pulmonary disease) (HCC)    Fibromyalgia    GERD (gastroesophageal reflux disease)    Heart murmur    Hepatitis C    Hypertension    Hypotension    Hypothyroid    Migraines    MRSA (methicillin resistant staph aureus) culture positive     Past Surgical History:  Procedure Laterality Date   ABDOMINAL HYSTERECTOMY     partial   BACK SURGERY     lumbar removal of disc   BIOPSY  09/06/2014   Procedure: BIOPSY;  Surgeon: Malissa Hippo, MD;  Location: [facility];  Service: Endoscopy;;   CHOLECYSTECTOMY     COLONOSCOPY WITH PROPOFOL N/A 09/06/2014    Procedure: COLONOSCOPY WITH PROPOFOL (Procedure #2) At cecum at 0819; total withdrawal time=13 minutes;  Surgeon: Malissa Hippo, MD;  Location: [facility];  Service: Endoscopy;  Laterality: N/A;   COLONOSCOPY WITH PROPOFOL N/A 12/30/2022   Procedure: COLONOSCOPY WITH PROPOFOL;  Surgeon: Dolores Frame, MD;  [facility];  Service: Gastroenterology;  Laterality: N/A;  11:00am;asa 3   ESOPHAGEAL DILATION N/A 09/06/2014   Procedure: ESOPHAGEAL DILATION WITH MALONEY DILATOR ;  Surgeon: Malissa Hippo, MD;  Location: [facility];  Service: Endoscopy;  Laterality: N/A;   ESOPHAGOGASTRODUODENOSCOPY (EGD) WITH PROPOFOL N/A 09/06/2014   Procedure: ESOPHAGOGASTRODUODENOSCOPY (EGD) WITH PROPOFOL (Procedure #1) ;  Surgeon: Malissa Hippo, MD;  Location: [facility];  Service: Endoscopy;  Laterality: N/A;   ESOPHAGOGASTRODUODENOSCOPY (EGD) WITH PROPOFOL N/A 09/02/2021   Procedure: ESOPHAGOGASTRODUODENOSCOPY (EGD) WITH PROPOFOL;  Surgeon: Dolores Frame, MD;  [facility];  Service: Gastroenterology;  Laterality: N/A;  830   ESOPHAGOGASTRODUODENOSCOPY (EGD) WITH PROPOFOL N/A 09/02/2022  Procedure: ESOPHAGOGASTRODUODENOSCOPY (EGD) WITH PROPOFOL;  Surgeon: Dolores Frame, MD;  [facility];  Service: Gastroenterology;  Laterality: N/A;  1030am, asa 3   HEMORRHOID SURGERY     Dr Vicenta Aly   HEMOSTASIS CLIP PLACEMENT  12/30/2022   Procedure: HEMOSTASIS CLIP PLACEMENT;  Surgeon: Dolores Frame, MD;  [facility];  Service: Gastroenterology;;   IR RADIOLOGIST EVAL & MGMT  08/26/2022   IR RADIOLOGIST EVAL & MGMT  09/22/2022   PARTIAL HYSTERECTOMY      One ovary left   POLYPECTOMY  12/30/2022   Procedure: POLYPECTOMY;  Surgeon: Dolores Frame, MD;  [facility];  Service: Gastroenterology;;   RECTAL PROLAPSE REPAIR     WRIST SURGERY  1997    HPI  from the history and physical done on the day of admission:  HPI:  Karla Young is a 62 y.o. female with medical history significant of cirrhosis, COPD, hypotension, hypothyroid, hepatitis C, anxiety and fibromyalgia who presented with altered mental status.  Patient currently is in a waiting list for liver transplant, she is getting outpatient paracentesis every week per GI recommendations. Last paracentesis 06.25.24 removing 2,7 L.  Yesterday afternoon patient was noted to be somnolent and less reactive. This morning when her family went to pick her up to bring her for outpatient paracentesis she was noted to be poorly responsive, very weak and deconditioned. Apparently she had falls at home that were unwitnessed.  Because of her severe change in mentation, EMS was called and she was transported to the ED.   Per her daughter, who is providing the history, it is possible that patient was not taking her medications or having adequate po intake over last 24 hrs.  Patient is not able to give any information due to somnolence.     Review of Systems: unable to review all systems due to the inability of the patient to answer questions.   Hospital Course:   1) Hepatic Encephalopathy-- -initially improved and resolved with lactulose -Became encephalopathic again in the evening of 02/21/2023 after lactulose was held for 36 hours due to very frequent stools (> 12 in 16 hrs) leading to transient hypotension and AKI -Started on lactulose and normalized on 02/22/2023 -Continue  xifaxan as recommended by GI service--if awake enough to take oral intake -Patient received IV albumin -May restart p.o. midodrine when awake enough to take oral intake -Started on octreotide on 02/22/2023 -Mentation is back to baseline after having BMs with lactulose -Follow clinically... No need for frequent serum ammonia checks   2) Enterobacter cloacae SBP--- ascitic fluid culture from 02/14/2023 with Enterobacter cloacae -Repeat paracentesis with removal of 4 L of ascitic fluid on  02/17/2023---ascitic fluid studies with improving cell count -Repeat diagnostic paracentesis on 02/22/2023 with removal of Foley 118 mL of peritoneal fluid--- cell count and gram stain pending --WBC 10.8 >>14.1 >>18.2>>17.1>>13.9>>11.6 --Stopped IV Rocephin -Started IV cefepime on 02/17/23 -Previously discussed with ID physician Dr. Kara Mead -Discussed with GI physician Dr. Marletta Lor--- ---  -Chest x-ray from 02/19/2023 without acute findings -Blood and urine cultures from 02/19/2023 NGTD -CT abdomen and pelvis from 02/21/2023 without acute findings -May be able to transition to oral  Cipro at discharge   3)AKI (acute kidney injury)  -Hepatorenal syndrome is in the differential but deemed to be less likely -Initially deemed to be due to dehydration/volume depletion in the setting of Aldactone and Lasix use compounded by multiple BMs over 12 BMs in 16 hrs )   -  Patient did develop transient hypotension noted with systolic blood pressure in the 80s and diastolic in the 50s overnight on 02/19/23 to 02/20/23 -Nephrology consult appreciated --We initially stopped Aldactone and Lasix -.  We also stopped lactulose -We gave IV albumin -BP improved with creatinine continue to trend up as above --Creatinine 1.0 >>1.69 >> 2.36 >>2.75 -Patient is restarted on Lactulose enema on 02/22/2023 -Patient is started on IV octreotide on 02/22/23 -Nephrologist restarted IV Lasix 02/22/2023 - renally adjust medications, avoid nephrotoxic agents / dehydration  / hypotension   4)Alcoholic liver cirrhosis with ascites----patient reports abstinence from alcohol since 06/2022 -Her liver failure is advanced.  -Actively following with [facility] transplant team to see if she is a candidate for possible transplant in the near future in [place]---she is not yet on the transplant list -Continue PPI and lactulose -Continue xifaxan -GI service consultation is appreciated; will follow any further  input/recommendations. -Continue to hold diuretics;  -Repeat paracentesis on 02/17/2023 and 02/22/23  as above #2 -INR is elevated,-- patient received vitamin K -CT abdomen and pelvis from 02/21/2023 as noted below #10  5)COPD (chronic obstructive pulmonary disease) (HCC) -No signs of exacerbation.  -Chest x-ray on 02/19/2023 without acute findings -Continue as needed bronchodilator management.   6)Hypothyroidism -TSH within normal limit -Continue Synthroid.   7)HTN (hypertension) -Stable/soft -Hold BP meds -Continue midodrine if awake enough to take it -See #3 above   9)Thrombocytopenia-- -Chronic and related to liver disease -No signs of acute overt bleeding -Continue to follow platelet count.   -Elevated INR noted   10)Hyponatremia--sodium currently remains in the mid 120 -Avoid excessive free water   11)Chronic Anemia--in the setting of liver cirrhosis  -Hgb currently above 8 -No active bleeding noted  Discharge Condition: guarded  Follow UP----Transfer to [facility]--CMS-------Dr. Kennon Holter Protopapadakis with the hepatology team at [facility]. They are happy to see patient in consultation once she is transferred. He requested to go ahead and check PETH test. This has been ordered.    Spoke with Dr. Tedra Senegal with ICU at [facility]. He has agreed for patient to be transferred. Stated there is an ICU bed available. The admitting doc will be Dr. Celesta Aver. Stated they will call our ICU once bed is ready.   Consults obtained - Gi/Nephrology  Diet and Activity recommendation:  As advised  Discharge Instructions    Discharge Instructions     Call MD for:  persistant dizziness or light-headedness   Complete by: As directed    Call MD for:  persistant nausea and vomiting   Complete by: As directed    Call MD for:  severe uncontrolled pain   Complete by: As directed    Call MD for:  temperature >100.4   Complete by: As directed    Diet - low sodium heart healthy   Complete by: As directed     Discharge instructions   Complete by: As directed    Transfer to [facility]--CMS-------Dr. Mikel Cella with the hepatology team at [facility]. They are happy to see patient in consultation once she is transferred. He requested to go ahead and check PETH test. This has been ordered.    Spoke with Dr. Tedra Senegal with ICU at [facility]. He has agreed for patient to be transferred. Stated there is an ICU bed available. The admitting doc will be Dr. Celesta Aver. Stated they will call our ICU once bed is ready.   Increase activity slowly   Complete by: As directed    No wound care   Complete  by: As directed        Discharge Medications     Allergies as of 02/22/2023   No Known Allergies      Medication List     STOP taking these medications    famotidine 20 MG tablet Commonly known as: PEPCID   Linzess 145 MCG Caps capsule Generic drug: linaclotide       TAKE these medications    albuterol (2.5 MG/3ML) 0.083% nebulizer solution Commonly known as: PROVENTIL Take 3 mLs (2.5 mg total) by nebulization every 2 (two) hours as needed for wheezing.   ceFEPIme 2 g in sodium chloride 0.9 % 100 mL Inject 2 g into the vein daily.   Darbepoetin Alfa 200 MCG/0.4ML Sosy injection Commonly known as: ARANESP Inject 0.4 mLs (200 mcg total) into the skin every Monday at 6 PM. Start taking on: February 28, 2023   folic acid 1 MG tablet Commonly known as: FOLVITE Take 1 tablet (1 mg total) by mouth daily.   furosemide 40 MG tablet Commonly known as: LASIX Take 0.5 tablets (20 mg total) by mouth daily. What changed: how much to take   hydrOXYzine 25 MG tablet Commonly known as: ATARAX Take 1 tablet (25 mg total) by mouth every 8 (eight) hours as needed for anxiety or nausea.   lactulose 10 GM/15ML Soln enema Commonly known as: CHRONULAC Place 300 mLs rectally 3 (three) times daily.   lactulose 10 GM/15ML solution Commonly known as: CHRONULAC Take 15 mLs (10 g total) by mouth 3 (three) times  daily. Goal is to have 2 to 3 Mushy/Loose stools per Day   levothyroxine 88 MCG tablet Commonly known as: SYNTHROID Take 1 tablet (88 mcg total) by mouth daily before breakfast.   midodrine 5 MG tablet Commonly known as: PROAMATINE Take 5 mg by mouth daily as needed (for low BP).   multivitamin with minerals tablet Take 1 tablet by mouth daily.   octreotide 100 MCG/ML Soln injection Commonly known as: SANDOSTATIN Inject 1 mL (100 mcg total) into the skin every 8 (eight) hours.   ondansetron 4 MG tablet Commonly known as: ZOFRAN Take 1 tablet (4 mg total) by mouth every 6 (six) hours as needed for nausea.   pantoprazole 40 MG tablet Commonly known as: PROTONIX Take 1 tablet (40 mg total) by mouth 2 (two) times daily. What changed: Another medication with the same name was added. Make sure you understand how and when to take each.   pantoprazole 40 MG injection Commonly known as: PROTONIX Inject 40 mg into the vein every 12 (twelve) hours. What changed: You were already taking a medication with the same name, and this prescription was added. Make sure you understand how and when to take each.   spironolactone 100 MG tablet Commonly known as: ALDACTONE Take 0.5 tablets (50 mg total) by mouth daily. What changed: how much to take   thiamine 100 MG tablet Commonly known as: Vitamin B-1 Take 1 tablet (100 mg total) by mouth daily.   Xifaxan 550 MG Tabs tablet Generic drug: rifaximin Take 550 mg by mouth 2 (two) times daily.        Major procedures and Radiology Reports - PLEASE review detailed and final reports for all details, in brief -    US Paracentesis  Result Date: 02/22/2023 INDICATION: Ascites. EXAM: ULTRASOUND GUIDED diagnostic PARACENTESIS MEDICATIONS: None. COMPLICATIONS: None immediate. PROCEDURE: Informed written consent was obtained from the patient's daughter after a discussion of the risks, benefits and alternatives to treatment.  A timeout was performed  prior to the initiation of the procedure. Initial ultrasound scanning demonstrates a large amount of ascites within the right lower abdominal quadrant. The right lower abdomen was prepped and draped in the usual sterile fashion. 1% lidocaine was used for local anesthesia. Following this, a paracentesis catheter was introduced. An ultrasound image was saved for documentation purposes. The paracentesis was performed. The catheter was removed and a dressing was applied. The patient tolerated the procedure well without immediate post procedural complication. FINDINGS: A total of approximately 180 mL of serous fluid was removed. Samples were sent to the laboratory as requested by the clinical team. IMPRESSION: Successful ultrasound-guided paracentesis yielding 180 mL of peritoneal fluid. Electronically Signed   By: Lupita Raider M.D.   On: 02/22/2023 15:08   CT ABDOMEN PELVIS WO CONTRAST  Result Date: 02/21/2023 CLINICAL DATA:  Right lower quadrant pain, leukocytosis.  Swelling. EXAM: CT ABDOMEN AND PELVIS WITHOUT CONTRAST TECHNIQUE: Multidetector CT imaging of the abdomen and pelvis was performed following the standard protocol without IV contrast. RADIATION DOSE REDUCTION: This exam was performed according to the departmental dose-optimization program which includes automated exposure control, adjustment of the mA and/or kV according to patient size and/or use of iterative reconstruction technique. COMPARISON:  08/03/2021. FINDINGS: Lower chest: No acute findings. Coronary artery calcification. Decreased attenuation of the intravascular compartment is indicative of anemia. Heart is at the upper limits of normal in size. No pericardial or pleural effusion. Prepericardiac lymph node measures 11 mm, similar. Distal esophagus is grossly unremarkable. Hepatobiliary: Liver is shrunken and irregular. Cholecystectomy. No biliary ductal dilatation. Pancreas: Grossly unremarkable. Spleen: Enlarged, 15.0 cm.  Adrenals/Urinary Tract: Adrenal glands and kidneys are grossly unremarkable. Ureters are decompressed. Bladder is low in volume. Stomach/Bowel: There may be gastric fold thickening. Small bowel, appendix and majority of the colon are unremarkable. There is an anastomosis in the sigmoid. Possible rectal wall thickening (2/89). Vascular/Lymphatic: Atherosclerotic calcification of the aorta. No pathologically enlarged lymph nodes. Reproductive: Hysterectomy.  No adnexal mass. Other: Large ascites with associated omental congestion. Musculoskeletal: Degenerative changes in the spine. No worrisome lytic or sclerotic lesions. IMPRESSION: 1. No evidence of appendicitis. 2. Large ascites with omental congestion. 3. Cirrhosis with splenomegaly. 4. Possible rectal wall thickening. 5. Aortic atherosclerosis (ICD10-I70.0). Coronary artery calcification. Electronically Signed   By: Leanna Battles M.D.   On: 02/21/2023 15:57   DG Chest 2 View  Result Date: 02/19/2023 CLINICAL DATA:  Dyspnea. EXAM: CHEST - 2 VIEW COMPARISON:  Chest x-ray dated Jan 01, 2023. FINDINGS: The heart size and mediastinal contours are within normal limits. Both lungs are clear. The visualized skeletal structures are unremarkable. IMPRESSION: No active cardiopulmonary disease. Electronically Signed   By: Obie Dredge M.D.   On: 02/19/2023 14:07   US Paracentesis  Result Date: 02/17/2023 INDICATION: Abdominal distension.  Ascites. EXAM: ULTRASOUND GUIDED right lower quadrant PARACENTESIS MEDICATIONS: None. COMPLICATIONS: None immediate. PROCEDURE: Informed written consent was obtained from the patient after a discussion of the risks, benefits and alternatives to treatment. A timeout was performed prior to the initiation of the procedure. Initial ultrasound scanning demonstrates a large amount of ascites within the right lower abdominal quadrant. The right lower abdomen was prepped and draped in the usual sterile fashion. 1% lidocaine was used  for local anesthesia. Following this, a 19 gauge, 7-cm, Yueh catheter was introduced. An ultrasound image was saved for documentation purposes. The paracentesis was performed. The catheter was removed and a dressing was applied. The patient tolerated  the procedure well without immediate post procedural complication. FINDINGS: A total of approximately 4.0 of yellow fluid was removed. Samples were sent to the laboratory as requested by the clinical team. IMPRESSION: Successful ultrasound-guided paracentesis yielding 4.0 liters of peritoneal fluid. Electronically Signed   By: Marin Roberts M.D.   On: 02/17/2023 12:42   US Paracentesis  Result Date: 02/14/2023 INDICATION: Ascites EXAM: ULTRASOUND GUIDED RIGHT PARACENTESIS MEDICATIONS: None. COMPLICATIONS: None immediate. PROCEDURE: Informed written consent was obtained from the patient after a discussion of the risks, benefits and alternatives to treatment. A timeout was performed prior to the initiation of the procedure. Initial ultrasound scanning demonstrates a large amount of ascites within the right lower abdominal quadrant. The right lower abdomen was prepped and draped in the usual sterile fashion. 1% lidocaine was used for local anesthesia. Following this, a 19 gauge, 7-cm, Yueh catheter was introduced. An ultrasound image was saved for documentation purposes. The paracentesis was performed. The catheter was removed and a dressing was applied. The patient tolerated the procedure well without immediate post procedural complication. Patient received post-procedure intravenous albumin; see nursing notes for details. FINDINGS: A total of approximately 4 L of amber fluid was removed. Samples were sent to the laboratory as requested by the clinical team. IMPRESSION: Successful ultrasound-guided paracentesis yielding 4 liters of peritoneal fluid. Electronically Signed   By: Gaylyn Rong M.D.   On: 02/14/2023 14:25   CT Head Wo Contrast  Result Date:  02/11/2023 CLINICAL DATA:  Fall.  Altered mental status. EXAM: CT HEAD WITHOUT CONTRAST TECHNIQUE: Contiguous axial images were obtained from the base of the skull through the vertex without intravenous contrast. RADIATION DOSE REDUCTION: This exam was performed according to the departmental dose-optimization program which includes automated exposure control, adjustment of the mA and/or kV according to patient size and/or use of iterative reconstruction technique. COMPARISON:  None Available. FINDINGS: Brain: There is no evidence for acute hemorrhage, hydrocephalus, mass lesion, or abnormal extra-axial fluid collection. No definite CT evidence for acute infarction. Patchy low attenuation in the deep hemispheric and periventricular white matter is nonspecific, but likely reflects chronic microvascular ischemic demyelination. Vascular: No hyperdense vessel or unexpected calcification. Skull: No evidence for fracture. No worrisome lytic or sclerotic lesion. Sinuses/Orbits: Left mastoid effusion. Visualized portions of the globes and intraorbital fat are unremarkable. Other: None. IMPRESSION: 1. No acute intracranial abnormality. 2. Patchy low attenuation in the deep hemispheric and periventricular white matter is nonspecific, but likely reflects chronic microvascular ischemic demyelination. 3. Left mastoid effusion. Electronically Signed   By: Kennith Center M.D.   On: 02/11/2023 13:06   US Paracentesis  Result Date: 02/01/2023 INDICATION: History of cirrhosis and recurrent ascites. EXAM: ULTRASOUND GUIDED DIAGNOSTIC AND THERAPEUTIC PARACENTESIS MEDICATIONS: None. COMPLICATIONS: None immediate. PROCEDURE: Informed written consent was obtained from the patient after a discussion of the risks, benefits and alternatives to treatment. A timeout was performed prior to the initiation of the procedure. Initial ultrasound scanning demonstrates a moderate amount of ascites within the right lower abdominal quadrant. The right  lower abdomen was prepped and draped in the usual sterile fashion. 1% lidocaine was used for local anesthesia. Following this, a 19 gauge, 7-cm, Yueh catheter was introduced. An ultrasound image was saved for documentation purposes. The paracentesis was performed. The catheter was removed and a dressing was applied. The patient tolerated the procedure well without immediate post procedural complication. FINDINGS: A total of approximately 2.7 L of hazy yellow fluid was removed. Samples were sent to the laboratory as  requested by the clinical team. IMPRESSION: Successful ultrasound-guided paracentesis yielding 2.7 liters of peritoneal fluid. Electronically Signed   By: Obie Dredge M.D.   On: 02/01/2023 13:51   US Paracentesis  Result Date: 01/26/2023 INDICATION: Patient with history of cirrhosis, recurrent ascites. Request for diagnostic and therapeutic paracentesis. EXAM: ULTRASOUND GUIDED DIAGNOSTIC AND THERAPEUTIC PARACENTESIS MEDICATIONS: 6 mL 1% lidocaine. COMPLICATIONS: None immediate. PROCEDURE: Informed written consent was obtained from the patient after a discussion of the risks, benefits and alternatives to treatment. A timeout was performed prior to the initiation of the procedure. Initial ultrasound scanning demonstrates a large amount of ascites within the right lower abdominal quadrant. The right lower abdomen was prepped and draped in the usual sterile fashion. 1% lidocaine was used for local anesthesia. Following this, a 19 gauge, 7-cm, Yueh catheter was introduced. An ultrasound image was saved for documentation purposes. The paracentesis was performed. The catheter was removed and a dressing was applied. The patient tolerated the procedure well without immediate post procedural complication. Patient received post-procedure intravenous albumin; see nursing notes for details. FINDINGS: A total of approximately 3.2 L of hazy yellow fluid was removed. Samples were sent to the laboratory as  requested by the clinical team. IMPRESSION: Successful ultrasound-guided paracentesis yielding 3.2 liters of peritoneal fluid. Performed by [name], PA-C PLAN: The patient has previously been evaluated by the [facility], and deemed not a candidate for intervention. Electronically Signed   By: Simonne Come M.D.   On: 01/26/2023 11:42    Micro Results    Recent Results (from the past 240 hour(s))  Culture, body fluid w Gram Stain-bottle     Status: Abnormal   Collection Time: 02/14/23  1:30 PM   Specimen: Ascitic  Result Value Ref Range Status   Specimen Description   Final    ASCITIC Performed at [facility], [address] [place], [place] [address]    Special Requests   Final    BAA 10CC Performed at [facility], [address] [place], [place] [address]    Gram Stain   Final    GRAM NEGATIVE RODS IN BOTH AEROBIC AND ANAEROBIC BOTTLES Gram Stain Report Called to,Read Back By and Verified With: KILMER T. AT [id number] BY THOMPSON S. Performed at [facility], [address] [place], [place] [address]    Culture ENTEROBACTER CLOACAE (A)  Final   Report Status 02/17/2023 FINAL  Final   Organism ID, Bacteria ENTEROBACTER CLOACAE  Final      Susceptibility   Enterobacter cloacae - MIC*    CEFEPIME <=0.12 SENSITIVE Sensitive     CEFTAZIDIME <=1 SENSITIVE Sensitive     CIPROFLOXACIN <=0.25 SENSITIVE Sensitive     GENTAMICIN <=1 SENSITIVE Sensitive     IMIPENEM 1 SENSITIVE Sensitive     TRIMETH/SULFA <=20 SENSITIVE Sensitive     PIP/TAZO <=4 SENSITIVE Sensitive     * ENTEROBACTER CLOACAE  Gram stain     Status: None   Collection Time: 02/14/23  1:30 PM   Specimen: Ascitic  Result Value Ref Range Status   Specimen Description ASCITIC  Final   Special Requests NONE  Final   Gram Stain   Final    NO ORGANISMS SEEN CYTOSPIN SMEAR WBC PRESENT, PREDOMINANTLY PMN Performed at [facility], [address]  [place], [place] [address]    Report Status 02/14/2023 FINAL  Final  Urine Culture     Status: None (Preliminary result)   Collection Time: 02/19/23  8:30 AM   Specimen: Urine, Clean Catch  Result Value Ref Range Status   Specimen Description   Final    URINE, CLEAN CATCH Performed at [facility], [address] [place], [place] [address]    Special Requests   Final    NONE Performed at [facility], [address], [place], [place] [address]    Culture   Final    CULTURE REINCUBATED FOR BETTER GROWTH Performed at [facility], [address] [place], [place] [address]    Report Status PENDING  Incomplete  Culture, blood (Routine X 2) w Reflex to ID Panel     Status: None (Preliminary result)   Collection Time: 02/19/23  8:46 AM   Specimen: BLOOD RIGHT FOREARM  Result Value Ref Range Status   Specimen Description   Final    BLOOD RIGHT FOREARM BOTTLES DRAWN AEROBIC AND ANAEROBIC   Special Requests Blood Culture adequate volume  Final   Culture   Final    NO GROWTH 3 DAYS Performed at [facility], [address], [place], [place] [address]    Report Status PENDING  Incomplete  Culture, blood (Routine X 2) w Reflex to ID Panel     Status: None (Preliminary result)   Collection Time: 02/19/23  8:46 AM   Specimen: BLOOD LEFT HAND  Result Value Ref Range Status   Specimen Description   Final    BLOOD LEFT HAND BOTTLES DRAWN AEROBIC AND ANAEROBIC   Special Requests Blood Culture adequate volume  Final   Culture   Final    NO GROWTH 3 DAYS Performed at [facility], [address] [place], [place] [address]    Report Status PENDING  Incomplete   Today   Subjective    [name] today has no new complaints No fevers -no emesis -Had BM with lactulose enema -Daughter at bedside, questions answered     [facility] to [facility]--CMS   Patient has been seen and examined prior to discharge   Objective   Blood pressure (!) 102/51, pulse 60, temperature 97.9 F (36.6 C),  temperature source Oral, resp. rate 11, height 5\' 7"  (1.702 m), weight 64.8 kg, SpO2 100%.   Intake/Output Summary (Last 24 hours) at 02/22/2023 1546 Last data filed at 02/22/2023 1540 Gross per 24 hour  Intake 1302 ml  Output 1 ml  Net 1301 ml   Exam Gen:-Very lethargic, somnolent HEENT:- [place].AT, +ve sclera icterus Neck-Supple Neck,No JVD,.  Lungs-  CTAB , fair air movement bilaterally  CV- S1, S2 normal, RRR, 3/6 SM Abd-  +ve B.Sounds, Abd Soft, increased in the abdominal distention, uncomfortable with palpation, no rebound or guarding, no CVA area tenderness Extremity/Skin:- No  edema, good pedal pulses  Psych-disoriented, lethargic,  neuro-generalized weakness, no new focal deficits, +ve tremors/asterixis   Data Review   CBC w Diff:  Lab Results  Component Value Date   WBC 11.6 (H) 02/22/2023   HGB 8.3 (L) 02/22/2023   HCT 24.2 (L) 02/22/2023   PLT 97 (L) 02/22/2023   LYMPHOPCT 8 02/20/2023   MONOPCT 12 02/20/2023   EOSPCT 4 02/20/2023   BASOPCT 1 02/20/2023   CMP:  Lab Results  Component Value Date   NA 125 (L) 02/22/2023   K 4.9 02/22/2023   CL 100 02/22/2023   CO2 17 (L) 02/22/2023   BUN 62 (H) 02/22/2023   CREATININE 2.75 (H) 02/22/2023   CREATININE 0.67 03/23/2022   PROT 5.9 (L) 02/22/2023   ALBUMIN 3.5 02/22/2023  ALBUMIN 3.5 02/22/2023   BILITOT 5.1 (H) 02/22/2023   ALKPHOS 51 02/22/2023   AST 24 02/22/2023   ALT 18 02/22/2023   Total Discharge time is about 33 minutes  Shon Hale M.D on 02/22/2023 at 3:46 PM  Go to [email or web address] -  for contact info  [facility] - Office  [phone]

## 2023-02-22 NOTE — Progress Notes (Signed)
Paracentesis procedure on right side complete at this time and 180 mL of clear yellow fluid removed and sent to lab for processing. Consent obtained from Daughter due to patient's altered mental status. Daughter verbalized understanding of post procedure instructions and patient transported back to ICU room assignment at this time via stretcher with vital signs stable and full linen change performed to inpatient bed. Report given to [place], Charity fundraiser.

## 2023-02-22 NOTE — Progress Notes (Signed)
Subjective:  UOP not recorded-  BUN and crt cont to rise-  latest BPs better ( higher) -  urine sodium has not been resulted -   pt mch more confused -   encephalopathic   Objective Vital signs in last 24 hours: Vitals:   02/21/23 0903 02/21/23 2206 02/22/23 0245 02/22/23 0455  BP: (!) 100/56 117/64 (!) 155/136 (!) 129/50  Pulse: 73 61 75 63  Resp:  16    Temp: 97.9 F (36.6 C) 97.8 F (36.6 C)  97.9 F (36.6 C)  TempSrc: Oral   Oral  SpO2: 100% 100%  100%  Weight:    66.1 kg  Height:       Weight change: 1.2 kg  Intake/Output Summary (Last 24 hours) at 02/22/2023 0819 Last data filed at 02/21/2023 1700 Gross per 24 hour  Intake 1422 ml  Output --  Net 1422 ml    Assessment/Plan: 62 year old WF with severe cirrhosis ( MELD 33) and chronic hypotension now with AKI 1.Renal- differential would be pre renal /ATN vs HRS.  Meds adjusted for the former ( holding aldactone and lasix)-  the only change I made was to give more albumin over the last 24 hours.   urine sodium has not been resulted yet.  We  have on midodrine and albumin but not yet octreotide for HRS, will go ahead and add today given slight trend for worse.  No need for RRT as of yet  2. Hypertension/volume  - chronic hypotension and not volume overloaded at this time-  lasix /aldactone recently held-  will add back a little lasix since we now have a little more BP to work with 3. Cirrhosis-  severe-  GI on board-   pt more confused with lactulose on hold -  GI talking about transfer to tertiary care center-  going to resume lactulose 4. Anemia  - multifactorial-  not helping hemodynamics-   giving ESA 5. Hyponatremia-  hypervolemic hyponatremia-  to resume lasix today    Cecille Aver    Labs: Basic Metabolic Panel: Recent Labs  Lab 02/20/23 0903 02/21/23 0612 02/22/23 0440  NA 126* 125* 125*  K 4.2 5.0 4.9  CL 101 101 100  CO2 19* 18* 17*  GLUCOSE 116* 80 87  BUN 39* 49* 62*  CREATININE 1.69* 2.36*  2.75*  CALCIUM 8.6* 8.9 9.0  PHOS  --  4.4 4.6   Liver Function Tests: Recent Labs  Lab 02/19/23 0459 02/20/23 0903 02/21/23 0612 02/21/23 0807 02/22/23 0440  AST 39 35  --  31  --   ALT 27 25  --  21  --   ALKPHOS 66 79  --  58  --   BILITOT 5.1* 5.4*  --  5.0*  --   PROT 5.6* 5.8*  --  5.7*  --   ALBUMIN 3.0* 2.9* 2.9* 3.0* 3.5   No results for input(s): "LIPASE", "AMYLASE" in the last 168 hours. No results for input(s): "AMMONIA" in the last 168 hours. CBC: Recent Labs  Lab 02/16/23 0457 02/18/23 0433 02/19/23 0459 02/20/23 0903 02/21/23 0612  WBC 10.8* 14.1* 18.2* 17.1* 13.9*  NEUTROABS  --   --   --  12.5*  --   HGB 9.5* 8.6* 9.4* 9.5* 8.9*  HCT 27.7* 25.0* 27.3* 28.1* 26.2*  MCV 89.6 87.7 88.6 90.4 89.4  PLT 81* 81* 104* 104* 91*   Cardiac Enzymes: No results for input(s): "CKTOTAL", "CKMB", "CKMBINDEX", "TROPONINI" in the last 168  hours. CBG: No results for input(s): "GLUCAP" in the last 168 hours.  Iron Studies: No results for input(s): "IRON", "TIBC", "TRANSFERRIN", "FERRITIN" in the last 72 hours. Studies/Results: CT ABDOMEN PELVIS WO CONTRAST  Result Date: 02/21/2023 CLINICAL DATA:  Right lower quadrant pain, leukocytosis.  Swelling. EXAM: CT ABDOMEN AND PELVIS WITHOUT CONTRAST TECHNIQUE: Multidetector CT imaging of the abdomen and pelvis was performed following the standard protocol without IV contrast. RADIATION DOSE REDUCTION: This exam was performed according to the departmental dose-optimization program which includes automated exposure control, adjustment of the mA and/or kV according to patient size and/or use of iterative reconstruction technique. COMPARISON:  08/03/2021. FINDINGS: Lower chest: No acute findings. Coronary artery calcification. Decreased attenuation of the intravascular compartment is indicative of anemia. Heart is at the upper limits of normal in size. No pericardial or pleural effusion. Prepericardiac lymph node measures 11 mm,  similar. Distal esophagus is grossly unremarkable. Hepatobiliary: Liver is shrunken and irregular. Cholecystectomy. No biliary ductal dilatation. Pancreas: Grossly unremarkable. Spleen: Enlarged, 15.0 cm. Adrenals/Urinary Tract: Adrenal glands and kidneys are grossly unremarkable. Ureters are decompressed. Bladder is low in volume. Stomach/Bowel: There may be gastric fold thickening. Small bowel, appendix and majority of the colon are unremarkable. There is an anastomosis in the sigmoid. Possible rectal wall thickening (2/89). Vascular/Lymphatic: Atherosclerotic calcification of the aorta. No pathologically enlarged lymph nodes. Reproductive: Hysterectomy.  No adnexal mass. Other: Large ascites with associated omental congestion. Musculoskeletal: Degenerative changes in the spine. No worrisome lytic or sclerotic lesions. IMPRESSION: 1. No evidence of appendicitis. 2. Large ascites with omental congestion. 3. Cirrhosis with splenomegaly. 4. Possible rectal wall thickening. 5. Aortic atherosclerosis (ICD10-I70.0). Coronary artery calcification. Electronically Signed   By: Leanna Battles M.D.   On: 02/21/2023 15:57   Medications: Infusions:  ceFEPime (MAXIPIME) IV 2 g (02/21/23 2239)    Scheduled Medications:  Chlorhexidine Gluconate Cloth  6 each Topical Daily   darbepoetin (ARANESP) injection - DIALYSIS  200 mcg Subcutaneous Q Mon-1800   enoxaparin (LOVENOX) injection  30 mg Subcutaneous Q24H   famotidine  20 mg Oral QHS   folic acid  1 mg Oral Daily   lactulose  20 g Oral BID   levothyroxine  88 mcg Oral QAC breakfast   midodrine  10 mg Oral TID WC   multivitamin with minerals  1 tablet Oral Daily   pantoprazole  40 mg Oral BID   phytonadione  5 mg Oral Once   rifaximin  550 mg Oral BID   thiamine  100 mg Oral Daily   traZODone  100 mg Oral QHS    have reviewed scheduled and prn medications.  Physical Exam: General: sideways in bed-  eyes closed-  not responsive to conversation-   intermittent twitching Heart:RRR Lungs:  poor effort but clear Abdomen: distended Extremities: pitting edema     02/22/2023,8:19 AM  LOS: 11 days

## 2023-02-22 NOTE — Progress Notes (Signed)
Patient disoriented and lethargic this am,patient was encephalopathic ,unable to answer questions,and follow simple command .Involuntary twitching noted. Patient being transferred to ICCU bed 2, per Dr Marisa Severin orders. Family at bedside. Report called and given to [facility]. Accompanied by staff to awaiting floor.

## 2023-02-22 NOTE — Progress Notes (Signed)
[facility] leaving with patient ° °

## 2023-02-22 NOTE — Progress Notes (Signed)
[facility] in to prepare for transport. Report called to [facility]. Family aware of transport arrangement and placement.

## 2023-02-22 NOTE — Progress Notes (Signed)
Spoke with Dr. Kennon Holter Protopapadakis with the hepatology team at [facility]. They are happy to see patient in consultation once she is transferred. He requested to go ahead and check PETH test. This has been ordered.   Spoke with Dr. Tedra Senegal with ICU at [facility]. He has agreed for patient to be transferred. Stated there is an ICU bed available. The admitting doc will be Dr. Celesta Aver. Stated they will call our ICU once bed is ready.   We will plan to use [facility] for transport, but if unable, we can call [phone] to requedt transport. Just let them know we have a pending transfer and need transport.   I have updated Dr. Mariea Clonts and nurse [place].   Ermalinda Memos, PA-C [facility]

## 2023-02-22 NOTE — Progress Notes (Addendum)
Spoke with both [facility] and [facility] representatives on transporting patient. Both stated that it would not be able to happen tonight due to staffing and no available trucks. Ultimately called [facility] back and requested transport services which they said will be for tomorrow. Did not give an ETA, but stated it will be tomorrow.

## 2023-02-22 NOTE — Progress Notes (Signed)
Lactulose enema given, patient only able to retain of fluids, breaks taken multiple times to help patient retain fluid. Non-successful. MD made aware.

## 2023-02-22 NOTE — Progress Notes (Signed)
RN spoke with Daughter [name] the second time at 95.  Daughter concerned with the decrease in mentation of mother.  Family concerned with the Lactulose being on Hold and the negative impact they may be having on the Pt mentation.  RN notes the Pt is less alert than 7/15 shift and tremors are more pronounced.  RN assured family that information will be passed to the dayshift team to follow up with Alphonsus Sias stressed the important of the doctor updating her after AM rounds.

## 2023-02-22 NOTE — Progress Notes (Addendum)
Subjective: Patient is encephalopathic this morning. Unable to give any history/answer questions.   Objective: Vital signs in last 24 hours: Temp:  [97.8 F (36.6 C)-97.9 F (36.6 C)] 97.9 F (36.6 C) (07/16 0455) Pulse Rate:  [61-75] 63 (07/16 0455) Resp:  [16] 16 (07/15 2206) BP: (117-155)/(50-136) 129/50 (07/16 0455) SpO2:  [100 %] 100 % (07/16 0455) Weight:  [66.1 kg] 66.1 kg (07/16 0455) Last BM Date : 02/20/23 General:   Alert, but not able to answer questions or follow commands. Ill appearing. Jaundiced.  Head:  Normocephalic and atraumatic. Eyes:  + scleral icterus  Abdomen:  Bowel sounds present, distended, soft, mild TTP in RLQ. No rebound or guarding.  Msk:  Symmetrical without gross deformities. Normal posture. Extremities:  Without edema. Neurologic:  Alert. Encephalopathic. Unable to answer any questions, involuntary twitching. Uncoordinated with movements. Unable to follow commands.   Skin:  Warm and dry, intact without significant lesions.   Intake/Output from previous day: 07/15 0701 - 07/16 0700 In: 1422 [P.O.:1322; IV Piggyback:100] Out: -  Intake/Output this shift: No intake/output data recorded.  Lab Results: Recent Labs    02/20/23 0903 02/21/23 0612 02/22/23 0440  WBC 17.1* 13.9* 11.6*  HGB 9.5* 8.9* 8.3*  HCT 28.1* 26.2* 24.2*  PLT 104* 91* 97*   BMET Recent Labs    02/20/23 0903 02/21/23 0612 02/22/23 0440  NA 126* 125* 125*  K 4.2 5.0 4.9  CL 101 101 100  CO2 19* 18* 17*  GLUCOSE 116* 80 87  BUN 39* 49* 62*  CREATININE 1.69* 2.36* 2.75*  CALCIUM 8.6* 8.9 9.0   LFT Recent Labs    02/20/23 0903 02/21/23 0612 02/21/23 0807 02/22/23 0440  PROT 5.8*  --  5.7* 5.9*  ALBUMIN 2.9* 2.9* 3.0* 3.5  3.5  AST 35  --  31 24  ALT 25  --  21 18  ALKPHOS 79  --  58 51  BILITOT 5.4*  --  5.0* 5.1*  BILIDIR  --   --  1.6* 1.6*  IBILI  --   --  3.4* 3.5*   PT/INR Recent Labs    02/21/23 0612 02/22/23 0831  LABPROT 24.7* 25.7*   INR 2.2* 2.3*    Studies/Results: CT ABDOMEN PELVIS WO CONTRAST  Result Date: 02/21/2023 CLINICAL DATA:  Right lower quadrant pain, leukocytosis.  Swelling. EXAM: CT ABDOMEN AND PELVIS WITHOUT CONTRAST TECHNIQUE: Multidetector CT imaging of the abdomen and pelvis was performed following the standard protocol without IV contrast. RADIATION DOSE REDUCTION: This exam was performed according to the departmental dose-optimization program which includes automated exposure control, adjustment of the mA and/or kV according to patient size and/or use of iterative reconstruction technique. COMPARISON:  08/03/2021. FINDINGS: Lower chest: No acute findings. Coronary artery calcification. Decreased attenuation of the intravascular compartment is indicative of anemia. Heart is at the upper limits of normal in size. No pericardial or pleural effusion. Prepericardiac lymph node measures 11 mm, similar. Distal esophagus is grossly unremarkable. Hepatobiliary: Liver is shrunken and irregular. Cholecystectomy. No biliary ductal dilatation. Pancreas: Grossly unremarkable. Spleen: Enlarged, 15.0 cm. Adrenals/Urinary Tract: Adrenal glands and kidneys are grossly unremarkable. Ureters are decompressed. Bladder is low in volume. Stomach/Bowel: There may be gastric fold thickening. Small bowel, appendix and majority of the colon are unremarkable. There is an anastomosis in the sigmoid. Possible rectal wall thickening (2/89). Vascular/Lymphatic: Atherosclerotic calcification of the aorta. No pathologically enlarged lymph nodes. Reproductive: Hysterectomy.  No adnexal mass. Other: Large ascites with associated omental congestion.  Musculoskeletal: Degenerative changes in the spine. No worrisome lytic or sclerotic lesions. IMPRESSION: 1. No evidence of appendicitis. 2. Large ascites with omental congestion. 3. Cirrhosis with splenomegaly. 4. Possible rectal wall thickening. 5. Aortic atherosclerosis (ICD10-I70.0). Coronary artery  calcification. Electronically Signed   By: Leanna Battles M.D.   On: 02/21/2023 15:57    Assessment: 62 y.o. female with a history of decompensated cirrhosis secondary to hepatitis C (s/p treatment x 2 in 2015 and 2022 with eradication), alcohol use with last drink in November 2023.  Course has been complicated by ascites, esophageal varices, coagulopathy. Now  admitted with worsening encephalopathy and acute renal failure in setting of multiple paras for recurrent ascites.   Hepatic encephalopathy:  Had improved with lactulose and Xifaxan, but significant worsening over the last 24 hours, now alert but unable to answer questions or follow commands, with involuntary twitching and uncoordinated movements. Lactulose was on hold yesterday due to reports of significant diarrhea and worsening Cr. Only 1 recorded BM yesterday. Oral lactulose was to be restarted this morning, but patient is not safe to take po at this time. Will order lactulose enemas now. I have notified nursing staff.   Decompensated ETOH Cirrhosis:  MELD 3.0 is rising, up to 37 yesterday. Ordered labs this morning for recalculation. Cr has been worsening, up to 2.75 today. Bilirubin and INR has also elevated.  No overt GI bleeding. She has received vitamin K x3 this admission with last dose on 7/14. Will consider giving additional vitamin K pending repeat labs this morning.   Regarding her ascites, she underwent paracentesis  7/8 yielding 4L (max allowed) with SBP, management as per below. Repeat para 7/11 yielding 4L. Still with large volume ascites, no peripheral edema. Unable to tolerate titration of diuretics due to renal function. Outpatient lasix 20mg /aldactone 50mg  had been on hold due to AKI. Restarted 02/15/23, but again discontinued 7/14 due to worsening Cr. Recent US liver doppler without PVT.   No suspicious hepatic lesions on recent CT exam.   Recent EGD in January 2024 with grade 1 esophageal varices and portal hypertensive  gastropathy.   Deemed to not be a candidate for TIPS procedure due to hide MELD. Has been undergoing transplant evaluation with [facility] in [place]. Appt scheduled for 7/24. I have reached out to [facility]) for possible transfer today. Waiting on a call back.   SBP:  Paracentesis on 7/8 consistent with SBP.  Started on Rocephin on 7/8 as well as IV albumin per protocol. Received additional Albumin on day 3. Culture grew Enterobacter Cloacae was sensitive to cefepime and ceftazidime.  Was transitioned to IV cefepime on 7/11. Paracentesis 7/11 with improving PMNs. WBC count increased to 18.2 on 7/13, unclear if this is related to SBP or other.  Cefepime continued. UA not consistent with infection, culture pending. CXR negative. WBC is improving, down to 11.6 today.   Abdominal pain:  Reported abdominal pain starting on 7/14. Seems to be more right sided/RLQ. CT A/P w/o contrast yesterday with large ascites with omental congestion, possible rectal wall thickening. Recent colonoscopy May 2024 with 2 tubular adenomas removed and 1 hyperplastic polyp removed. She is not having significant diarrhea to suggest infection and she has been on broad spectrum antibiotics. Questionable rectal wall thickening may be more related to portal colopathy. Abdominal pain may very well be related to ascites/SBP. Planning for diagnostic para.   AKI:  Cr 2.56 on admission. Diuretics held. Cr normalized on 7/9, so she was restarted on  Lasix 20 mg and spironolactone 50 mg. Cr remained fairly stable until 7/14 when it bumped to 1.69. She did have hypotension with BP 89/51 on 7/14. Midodrine was increased. Lactulose was decreased and temporarily held for 24 hours and Linzess was discontinue due to reports of multiple Bms. Nephrology consulted 7/15 due to Cr up to 2.36. She was given additional IV albumin. Cr up to 2.75 today. Appreciate additional assistance from nephrology.     Plan: CBC, HFP, INR. Will recalculate  MELD with this.  Start Lactulose edemas TID.  Continue to monitor mental status closely.  NPO for now as patient is not safe for po intake.  Will order diagnostic paracentesis only due to worsening renal function.  Continue cefepime.  Appreciate nephrology assistance.  Called [facility] for possible transfer. Waiting to hear back.    LOS: 11 days    02/22/2023, 9:04 AM   Ermalinda Memos, [facility]

## 2023-02-23 LAB — URINE CULTURE: Culture: 30000 — AB

## 2023-02-23 LAB — CULTURE, BODY FLUID W GRAM STAIN -BOTTLE

## 2023-02-24 LAB — CULTURE, BLOOD (ROUTINE X 2)
Culture: NO GROWTH
Culture: NO GROWTH
Special Requests: ADEQUATE
Special Requests: ADEQUATE

## 2023-02-25 LAB — CULTURE, BODY FLUID W GRAM STAIN -BOTTLE

## 2023-02-27 DIAGNOSIS — E873 Alkalosis: Secondary | ICD-10-CM | POA: Insufficient documentation

## 2023-02-27 DIAGNOSIS — E870 Hyperosmolality and hypernatremia: Secondary | ICD-10-CM | POA: Insufficient documentation

## 2023-02-27 LAB — MISC LABCORP TEST (SEND OUT): Labcorp test code: 791584

## 2023-02-27 LAB — CULTURE, BODY FLUID W GRAM STAIN -BOTTLE: Culture: NO GROWTH

## 2023-03-03 DIAGNOSIS — R188 Other ascites: Secondary | ICD-10-CM | POA: Diagnosis not present

## 2023-03-04 ENCOUNTER — Telehealth: Payer: Self-pay

## 2023-03-04 DIAGNOSIS — K7682 Hepatic encephalopathy: Secondary | ICD-10-CM

## 2023-03-07 ENCOUNTER — Telehealth: Payer: Self-pay | Admitting: *Deleted

## 2023-03-07 DIAGNOSIS — Z01818 Encounter for other preprocedural examination: Secondary | ICD-10-CM | POA: Diagnosis not present

## 2023-03-07 NOTE — Progress Notes (Unsigned)
  Care Coordination  Outreach Note  03/07/2023 Name: Jeyleen Lucker MRN: [id number] DOB: 1961-03-20   Care Coordination Outreach Attempts: An unsuccessful telephone outreach was attempted today to offer the patient information about available care coordination services.  Follow Up Plan:  Additional outreach attempts will be made to offer the patient care coordination information and services.   Encounter Outcome:  No Answer  Gwenevere Ghazi  Care Coordination Care Guide  Direct Dial: [phone]

## 2023-03-09 ENCOUNTER — Ambulatory Visit: Payer: 59 | Admitting: *Deleted

## 2023-03-09 DIAGNOSIS — R3911 Hesitancy of micturition: Secondary | ICD-10-CM | POA: Insufficient documentation

## 2023-03-09 DIAGNOSIS — N1831 Chronic kidney disease, stage 3a: Secondary | ICD-10-CM | POA: Insufficient documentation

## 2023-03-09 DIAGNOSIS — F418 Other specified anxiety disorders: Secondary | ICD-10-CM | POA: Insufficient documentation

## 2023-03-09 DIAGNOSIS — Z79899 Other long term (current) drug therapy: Secondary | ICD-10-CM | POA: Insufficient documentation

## 2023-03-09 DIAGNOSIS — F330 Major depressive disorder, recurrent, mild: Secondary | ICD-10-CM | POA: Insufficient documentation

## 2023-03-09 DIAGNOSIS — Z87891 Personal history of nicotine dependence: Secondary | ICD-10-CM | POA: Insufficient documentation

## 2023-03-09 DIAGNOSIS — R011 Cardiac murmur, unspecified: Secondary | ICD-10-CM | POA: Insufficient documentation

## 2023-03-09 DIAGNOSIS — J329 Chronic sinusitis, unspecified: Secondary | ICD-10-CM | POA: Insufficient documentation

## 2023-03-09 NOTE — Patient Outreach (Addendum)
Care Coordination   Follow Up Visit Note   03/11/2023 late entry for 03/09/23 Name: Karla Young MRN: [id number] DOB: 10-31-1960  Karla Young is a 62 y.o. year old female who sees Karla Pali, FNP for primary care. I spoke with  Karla Young by phone today.  What matters to the patients health and wellness today?  Discharged last Thursday 03/03/23 from [facility] after being in a coma [facility] but [facility] services Karla Young on the patient's liver care transplant team was contacted by daughter for assist with home health services after [facility] services, with orders to be sent to pcp and other agencies  03/11/23 [facility] confirmed patient not active with them Daughter updated on completed interventions  Patient now residing with Karla Young, daughter who is a prn nurse and her family at [address] [place]  vs her apartment in [place]  Karla Young has recently had gall bladder surgery, works on Friday Karla Young and children assist to make sure patient has medicines, food    Medications [facility] to [facility] patient's  Cipro, lactulose, xifaxan Wants to have all medicines forward close to the home [facility] [place]   durable medical equipment (DME)  In the home they have a 3 n 1 shower chair, walker, assist bars in garage,  hand rail,  Has 2 steps at entry and a ramp is need   Patient with Ascites pending paracentesis at [facility]   Follow up appointments On 03/22/23 scheduled to see the liver MD She generally does not see her pcp & sees her specialists more often  All orders including discharge orders are at [facility]   pending medicaid CAPs services/program application completion by daughter    Goals Addressed             This Visit's Progress    Home health services -[facility] care coordination   Not on track    Interventions Today     Flowsheet Row Most Recent Value  Chronic Disease   Chronic disease during today's visit --  [home health services needed]  General Interventions   General Interventions Discussed/Reviewed General Interventions Discussed, [facility], Doctor Visits, Communication with  Doctor Visits Discussed/Reviewed Doctor Visits Discussed, PCP, Specialist  PCP/Specialist Visits Compliance with follow-up visit  Communication with Social Work, Pharmacists, PCP/Specialists  [collaboration with [employer] SW, Review of Karla Young hepatology staff epic notes, outreaches to pcp (office closed 03/11/23), to Dr Hamilton [facility] referral assist left message w/Karla [facility]) [name], [facility] [place] -message left]  Education Interventions   Education Provided Provided Education  Provided Verbal Education On [facility], [employer], When to see the doctor  [discussed the importance of post hospital pcp/specialist follow up to help with discharge orders]  Mental Health Interventions   Mental Health Discussed/Reviewed Mental Health Discussed, Coping Strategies  Pharmacy Interventions   Pharmacy Dicussed/Reviewed Pharmacy Topics Discussed, Medications and their functions, Affording Medications             Patient Active Problem List   Diagnosis Date Noted   Gastroesophageal reflux disease 03/11/2023   Abnormal levels of other serum enzymes 03/11/2023   Anxiety 03/11/2023   Benzodiazepine dependence (HCC) 03/11/2023   Body mass index (BMI) 30.0-30.9, adult 03/11/2023   Boils of multiple sites 03/11/2023   Cardiac murmur, unspecified 03/11/2023   Urinary hesitancy 03/09/2023  Systolic murmur 03/09/2023   Sinusitis 03/09/2023   Other long term (current) drug therapy 03/09/2023   Mixed anxiety and depressive disorder 03/09/2023   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/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   Former smoker 03/09/2023   Chronic kidney disease, stage 3a (HCC) 03/09/2023   Metabolic  alkalosis 02/27/2023   Hypernatremia 02/27/2023   Acute encephalopathy 02/22/2023   SBP (spontaneous bacterial peritonitis) (HCC) 02/14/2023   Hepatic encephalopathy (HCC) 02/11/2023   Encounter for screening colonoscopy 12/30/2022   Moderate protein-calorie malnutrition (HCC) 10/23/2022   Fibromyalgia 10/20/2022   Esophageal varices (HCC) 09/02/2022   Anemia 08/03/2022   ARF (acute renal failure) (HCC) 07/21/2022   Thrombocytopenia (HCC) 07/21/2022   ERRONEOUS ENCOUNTER--DISREGARD 07/19/2022   Alcohol abuse, uncomplicated 05/26/2022   History of hepatitis C 02/22/2022   Ascites    COPD (chronic obstructive pulmonary disease) (HCC) 08/06/2021   Hypertension 08/06/2021   Constipation    Ascites of liver 08/03/2021   Decompensated hepatic cirrhosis (HCC) 08/03/2021   Alcohol abuse 08/03/2021   Irritable bowel syndrome (IBS) 08/03/2021   Unspecified cirrhosis of liver (HCC) 08/03/2021   Tobacco abuse 02/07/2018   Hepatitis C 02/12/2014   Hypothyroidism 02/12/2014    SDOH assessments and interventions completed:  Yes    Care Coordination Interventions:  Yes, provided   Follow up plan: Follow up call scheduled for 03/11/23    Encounter Outcome:  Pt. Visit Completed    L. Noelle Penner, RN, BSN, CCM [facility] Care Management Community Coordinator Office number [phone]

## 2023-03-09 NOTE — Progress Notes (Signed)
  Care Coordination  Outreach Note  03/09/2023 Name: Nevayah Fersch MRN: [id number] DOB: 13-Jul-1961   Care Coordination Outreach Attempts: A second unsuccessful outreach was attempted today to offer the patient with information about available care coordination services.  Follow Up Plan:  Additional outreach attempts will be made to offer the patient care coordination information and services.   Encounter Outcome:  No Answer  Christie Nottingham  Care Coordination Care Guide  Direct Dial: [phone]

## 2023-03-09 NOTE — Progress Notes (Signed)
  Care Coordination   Note   03/09/2023 Name: Karla Young MRN: [id number] DOB: 1961/03/16  Karla Young is a 62 y.o. year old female who sees Wilmon Pali, FNP for primary care. I reached out to [employer] by phone today to offer care coordination services.  [name] was given information about Care Coordination services today including:   The Care Coordination services include support from the care team which includes your Nurse Coordinator, Clinical Social Worker, or Pharmacist.  The Care Coordination team is here to help remove barriers to the health concerns and goals most important to you. Care Coordination services are voluntary, and the patient may decline or stop services at any time by request to their care team member.   Care Coordination Consent Status: Patient agreed to services and verbal consent obtained.   Follow up plan:  Telephone appointment with care coordination team member scheduled for:  03/09/23  Encounter Outcome:  Pt. Scheduled  [facility] Coordination Care Guide  Direct Dial: [phone]

## 2023-03-10 ENCOUNTER — Ambulatory Visit: Payer: Self-pay

## 2023-03-10 ENCOUNTER — Ambulatory Visit: Payer: 59 | Admitting: Gastroenterology

## 2023-03-10 DIAGNOSIS — G9341 Metabolic encephalopathy: Secondary | ICD-10-CM | POA: Diagnosis not present

## 2023-03-10 DIAGNOSIS — R188 Other ascites: Secondary | ICD-10-CM | POA: Diagnosis not present

## 2023-03-10 DIAGNOSIS — R443 Hallucinations, unspecified: Secondary | ICD-10-CM | POA: Diagnosis not present

## 2023-03-10 DIAGNOSIS — Z6841 Body Mass Index (BMI) 40.0 and over, adult: Secondary | ICD-10-CM | POA: Diagnosis not present

## 2023-03-10 NOTE — Patient Outreach (Signed)
  Care Coordination   Initial Visit Note   03/10/2023 Name: Arij Lydy MRN: [id number] DOB: 09-09-1960  Canda Slappey is a 62 y.o. year old female who sees Wilmon Pali, FNP for primary care. I spoke with  [name] by phone today.  What matters to the patients health and wellness today?  Patient needs PC/OT/PT services after hospital discharge.    Goals Addressed             This Visit's Progress    Care Coordination Activities       Care Coordination Interventions: Personal Care/OT/PT-Patient was discharged 1 week ago from [place] after being in a coma.  Patient was to start services with [facility] for personal care, but was denied due to staffing shortage.  Patients daughter [name] has communicated with [facility] for services and needs to have the provider submit updated orders.  An appointment has been scheduled for 8/8 at 11am.  CAP Program was also about to start prior to hospital visit and daughter plans to continue with the process once she can obtain an immediate solution to home care needs for patient.  The denial information was communicated with Ashley Akin Transplant Team Coordinator in [place] [phone]).  Contact is made with Ms. [name] and a message is left to follow up with SW.  Family and friends will provide care once daughter returns to work on 03/11/23, but this is only temporary.            SDOH assessments and interventions completed:  No     Care Coordination Interventions:  Yes, provided  Interventions Today    Flowsheet Row Most Recent Value  General Interventions   General Interventions Discussed/Reviewed General Interventions Discussed  [Discussed plan for provider to complete order for PC/OT/PT]        Follow up plan: Follow up call scheduled for 03/11/23     Encounter Outcome:  Pt. Visit Completed

## 2023-03-10 NOTE — Patient Outreach (Signed)
  Care Coordination   03/10/2023 Name: Summar Coole MRN: [id number] DOB: 01/20/1961   Care Coordination Outreach Attempts:  An unsuccessful telephone outreach was attempted for a scheduled appointment today.  Follow Up Plan:  Additional outreach attempts will be made to offer the patient care coordination information and services.   Encounter Outcome:  No Answer   Care Coordination Interventions:  No, not indicated    SIG Lysle Morales, BSW Social Worker [facility] Care Management  [phone]

## 2023-03-10 NOTE — Patient Instructions (Signed)
Visit Information  Thank you for taking time to visit with me today. Please don't hesitate to contact me if I can be of assistance to you.   Following are the goals we discussed today:   Goals Addressed             This Visit's Progress    Care Coordination Activities       Care Coordination Interventions: Personal Care/OT/PT-Patient was discharged 1 week ago from [facility] after being in a coma.  Patient was to start services with [facility] for personal care, but was denied due to staffing shortage.  Patients daughter [name] has communicated with [facility] for services and needs to have the provider submit updated orders.  An appointment has been scheduled for 8/8 at 11am.  CAP Program was also about to start prior to hospital visit and daughter plans to continue with the process once she can obtain an immediate solution to home care needs for patient.  The denial information was communicated with Ashley Akin Transplant Team Coordinator in [place] [phone]).  Contact is made with Ms. [name] and a message is left to follow up with SW.  Family and friends will provide care once daughter returns to work on 03/11/23, but this is only temporary.            Our next appointment is by telephone on 03/11/23 at   Please call the care guide team at [phone] if you need to cancel or reschedule your appointment.   If you are experiencing a Mental Health or Behavioral Health Crisis or need someone to talk to, please call 911  Patient verbalizes understanding of instructions and care plan provided today and agrees to view in MyChart. Active MyChart status and patient understanding of how to access instructions and care plan via MyChart confirmed with patient.     Telephone follow up appointment with care management team member scheduled for:03/11/23   Lysle Morales, BSW Social Worker [facility] Care Management  [phone]

## 2023-03-11 ENCOUNTER — Ambulatory Visit: Payer: Self-pay | Admitting: *Deleted

## 2023-03-11 ENCOUNTER — Encounter: Payer: Self-pay | Admitting: *Deleted

## 2023-03-11 DIAGNOSIS — F419 Anxiety disorder, unspecified: Secondary | ICD-10-CM | POA: Insufficient documentation

## 2023-03-11 DIAGNOSIS — Z6830 Body mass index (BMI) 30.0-30.9, adult: Secondary | ICD-10-CM | POA: Insufficient documentation

## 2023-03-11 DIAGNOSIS — F1320 Sedative, hypnotic or anxiolytic dependence, uncomplicated: Secondary | ICD-10-CM | POA: Insufficient documentation

## 2023-03-11 DIAGNOSIS — R011 Cardiac murmur, unspecified: Secondary | ICD-10-CM | POA: Insufficient documentation

## 2023-03-11 DIAGNOSIS — L0292 Furuncle, unspecified: Secondary | ICD-10-CM | POA: Insufficient documentation

## 2023-03-11 DIAGNOSIS — R748 Abnormal levels of other serum enzymes: Secondary | ICD-10-CM | POA: Insufficient documentation

## 2023-03-11 DIAGNOSIS — K219 Gastro-esophageal reflux disease without esophagitis: Secondary | ICD-10-CM | POA: Insufficient documentation

## 2023-03-11 NOTE — Patient Outreach (Signed)
  Care Coordination   Collaboration with Liver transplant staff Karla Young and [facility] Young  Visit Note   03/11/2023 Name: Karla Young MRN: [id number] DOB: 1961-03-03  Karla Young is a 62 y.o. year old female who sees Karla Pali, FNP for primary care. I  spoke with Karla Young, Transplant coordinator for Karla Young and [facility] Young  What matters to the patients health and wellness today?  Collaboration with Liver transplant staff Karla Young and [facility] Young related to home health orders that are to be sent to Karla Young via e-mail and fax by Karla Young    Goals Addressed             This Visit's Progress    Home health services -[facility] Karla Young care coordination   Not on track    Interventions Today    Flowsheet Row Most Recent Value  General Interventions   General Interventions Discussed/Reviewed General Interventions Reviewed, Communication with  Communication with PCP/Specialists, Social Work  [employer] with Liver transplant staff Karla Young and Karla Young]              SDOH assessments and interventions completed:  No     Care Coordination Interventions:  Yes, provided   Follow up plan: Follow up call scheduled for 03/15/23    Encounter Outcome:  Pt. Visit Completed    L. Noelle Penner, Karla, BSN, CCM [facility] Care Management Community Coordinator Office number [phone]

## 2023-03-11 NOTE — Patient Instructions (Addendum)
Visit Information  Thank you for taking time to visit with me today. Please don't hesitate to contact me if I can be of assistance to you.   Following are the goals we discussed today:   Goals Addressed             This Visit's Progress    Home health services -[facility] care coordination   Not on track    Interventions Today    Flowsheet Row Most Recent Value  Chronic Disease   Chronic disease during today's visit --  [home health services needed]  General Interventions   General Interventions Discussed/Reviewed General Interventions Discussed, [facility], Doctor Visits, Communication with  Doctor Visits Discussed/Reviewed Doctor Visits Discussed, PCP, Specialist  PCP/Specialist Visits Compliance with follow-up visit  Communication with Social Work, Pharmacists, Investment banker, corporate with [employer] SW, Review of [name] hepatology staff epic notes, outreaches to pcp (office closed 03/11/23), to Dr Hamilton Capri [facility] referral assist left message w/Angel) [name], [facility] [place] -[facility] left]  Education Interventions   Education Provided Provided Education  Provided Verbal Education On [facility], [employer], When to see the doctor  [discussed the importance of post hospital pcp/specialist follow up to help with discharge orders]  Mental Health Interventions   Mental Health Discussed/Reviewed Mental Health Discussed, Coping Strategies  Pharmacy Interventions   Pharmacy Dicussed/Reviewed Pharmacy Topics Discussed, Medications and their functions, Affording Medications              Our next appointment is by telephone on 03/11/23 at 1 pm  Please call the care guide team at [phone] if you need to cancel or reschedule your appointment.   If you are experiencing a Mental Health or Behavioral Health Crisis or need someone to talk to, please call the Suicide and Crisis Lifeline: 988 call the [place] National Suicide Prevention  Lifeline: 256 613 5711 or TTY: (973) 548-3971 TTY 215-215-2985) to talk to a trained counselor call 1-800-273-TALK (toll free, 24 hour hotline) go to [facility] [address], [place] [phone]) call the [facility] Crisis Line: [phone] call 911   Patient verbalizes understanding of instructions and care plan provided today and agrees to view in MyChart. Active MyChart status and patient understanding of how to access instructions and care plan via MyChart confirmed with patient.     The patient has been provided with contact information for the care management team and has been advised to call with any health related questions or concerns.    L. Noelle Penner, RN, BSN, CCM [facility] Care Management Community Coordinator Office number [phone]

## 2023-03-11 NOTE — Patient Instructions (Signed)
Visit Information  Thank you for taking time to visit with me today. Please don't hesitate to contact me if I can be of assistance to you.   Following are the goals we discussed today:   Goals Addressed             This Visit's Progress    Home health services -[facility] RN CM care coordination   Not on track    Interventions Today    Flowsheet Row Most Recent Value  General Interventions   General Interventions Discussed/Reviewed General Interventions Reviewed, Communication with  Communication with PCP/Specialists, Social Work  [employer] with Liver transplant staff K [name] and [facility] SW]              Our next appointment is by telephone on 03/15/23 at 1130  Please call the care guide team at [phone] if you need to cancel or reschedule your appointment.   If you are experiencing a Mental Health or Behavioral Health Crisis or need someone to talk to, please call the Suicide and Crisis Lifeline: 988 call the [place] National Suicide Prevention Lifeline: (205)815-0755 or TTY: (337)103-5456 TTY 608-366-0938) to talk to a trained counselor call 1-800-273-TALK (toll free, 24 hour hotline) go to [facility] [address] [place] [phone]) call the [facility] Crisis Line: (661) [phone] call 911   Patient verbalizes understanding of instructions and care plan provided today and agrees to view in MyChart. Active MyChart status and patient understanding of how to access instructions and care plan via MyChart confirmed with patient.     The patient has been provided with contact information for the care management team and has been advised to call with any health related questions or concerns.    L. Noelle Penner, RN, BSN, CCM [facility] Care Management Community Coordinator Office number [phone]

## 2023-03-14 DIAGNOSIS — Z01818 Encounter for other preprocedural examination: Secondary | ICD-10-CM | POA: Diagnosis not present

## 2023-03-15 ENCOUNTER — Ambulatory Visit: Payer: Self-pay | Admitting: *Deleted

## 2023-03-15 NOTE — Patient Outreach (Addendum)
  Care Coordination   Follow Up Visit Note   03/15/2023 Name: Zanariah Dieckman MRN: [id number] DOB: [birth year]  Remini Narciso is a 62 y.o. year old female who sees Wilmon Pali, FNP for primary care. I spoke with [name], daughter of Kaleah Gatch by phone today.  What matters to the patients health and wellness today?  Collaboration with home health agencies, [facility] [employer] does not provide PT or OT only PCS and nurses  Confirmation number [id number] HHR for [facility] medicine transfer entry    Goals Addressed             This Visit's Progress    Home health services -[facility] RN CM care coordination       Interventions Today    Flowsheet Row Most Recent Value  Chronic Disease   Chronic disease during today's visit Other  [home health referrals, pharmacy transfer, updates to family]  General Interventions   General Interventions Discussed/Reviewed --  [received & pending review of [facility] intake staff]  Doctor Visits Discussed/Reviewed Doctor Visits Reviewed, PCP  PCP/Specialist Visits Compliance with follow-up visit  Communication with PCP/Specialists, Pharmacists  [referrals sent via EPIC to [facility], [facility], [facility], [facility], [facility] home health agencies]  Mental Health Interventions   Mental Health Discussed/Reviewed Mental Health Reviewed, Coping Strategies  Pharmacy Interventions   Pharmacy Dicussed/Reviewed --  [poor attempt to reach [facility] [place] Left message x 1 for [facility], spoke with John[facility]cassie at [facility], entered in request for patient medicine transfer via [facility] .[email or web address]/pharmacy/[email or web address]]  Safety Interventions   Safety Discussed/Reviewed Safety Discussed, Fall Risk              SDOH assessments and interventions completed:  No     Care Coordination Interventions:  Yes, provided   Follow up plan: Follow up call scheduled for 03/21/23    Encounter Outcome:  Pt. Visit Completed    L. Noelle Penner, RN, BSN, CCM [facility] Care  Management Community Coordinator Office number [phone]

## 2023-03-15 NOTE — Patient Instructions (Addendum)
Visit Information  Thank you for taking time to visit with me today. Please don't hesitate to contact me if I can be of assistance to you.   Following are the goals we discussed today:   Goals Addressed             This Visit's Progress    [facility] health services -[facility] RN CM care coordination       Interventions Today    Flowsheet Row Most Recent Value  Chronic Disease   Chronic disease during today's visit Other  [home health referrals, pharmacy transfer, updates to family]  General Interventions   General Interventions Discussed/Reviewed --  [received & pending review of [facility] intake staff]  Doctor Visits Discussed/Reviewed Doctor Visits Reviewed, PCP  PCP/Specialist Visits Compliance with follow-up visit  Communication with PCP/Specialists, Pharmacists  [referrals sent via EPIC to [facility], [facility], [facility], [facility], [facility] home health agencies]  Mental Health Interventions   Mental Health Discussed/Reviewed Mental Health Reviewed, Coping Strategies  Pharmacy Interventions   Pharmacy Dicussed/Reviewed --  [poor attempt to reach [facility] [place] Left message x 1 for [facility], spoke with John[facility]cassie at [facility], entered in request for patient medicine transfer via [email or web address]/[email or web address]]  Safety Interventions   Safety Discussed/Reviewed Safety Discussed, Fall Risk              Our next appointment is by telephone on 03/21/23 at 1 pm  Please call the care guide team at [phone] if you need to cancel or reschedule your appointment.   If you are experiencing a Mental Health or Behavioral Health Crisis or need someone to talk to, please call the Suicide and Crisis Lifeline: 988 call the [place] National Suicide Prevention Lifeline: 854 234 8669 or TTY: (620) 786-8219 TTY (310)802-5397) to talk to a trained counselor call 1-800-273-TALK (toll free, 24 hour hotline) go to [facility] [address], [place]  [phone]) call the [facility] Crisis Line: ([phone] call 911   Patient verbalizes understanding of instructions and care plan provided today and agrees to view in MyChart. Active MyChart status and patient understanding of how to access instructions and care plan via MyChart confirmed with patient.     The patient has been provided with contact information for the care management team and has been advised to call with any health related questions or concerns.     L. Noelle Penner, RN, BSN, CCM [facility] Care Management Community Coordinator Office number [phone]

## 2023-03-17 DIAGNOSIS — L600 Ingrowing nail: Secondary | ICD-10-CM | POA: Diagnosis not present

## 2023-03-17 DIAGNOSIS — N179 Acute kidney failure, unspecified: Secondary | ICD-10-CM | POA: Diagnosis not present

## 2023-03-17 DIAGNOSIS — K7460 Unspecified cirrhosis of liver: Secondary | ICD-10-CM | POA: Diagnosis not present

## 2023-03-17 DIAGNOSIS — Z713 Dietary counseling and surveillance: Secondary | ICD-10-CM | POA: Diagnosis not present

## 2023-03-17 DIAGNOSIS — B1920 Unspecified viral hepatitis C without hepatic coma: Secondary | ICD-10-CM | POA: Diagnosis not present

## 2023-03-18 DIAGNOSIS — R188 Other ascites: Secondary | ICD-10-CM | POA: Diagnosis not present

## 2023-03-21 ENCOUNTER — Ambulatory Visit: Payer: Self-pay | Admitting: *Deleted

## 2023-03-21 NOTE — Patient Outreach (Signed)
  Care Coordination   Follow Up Visit Note   03/21/2023 Name: Karla Young MRN: [id number] DOB: [birth year]  Karla Young is a 62 y.o. year old female who sees Wilmon Pali, FNP for primary care. I spoke with daughter, Karla Millet of  [name] by phone today.  What matters to the patients health and wellness today?  Remains without home health services-[name] reports the patient is doing the best she can. She has had family and extended family with her at [employer] home (daughter, son in law, in Social worker). Karla Millet confirms there has not been any outreaches from any of the following home health agencies ( [place], [place], [facility], [facility], [facility], [facility])  after this RN CM sent referrals on last week.  RN CM updated from [facility] and [facility] only, reporting there is not available staff to assist the patient or she did not met personal care services criteria ([facility])  Liver Ascites/Fluid increase continues  To be seen by Dr Vanessa Barbara on 03/22/23    prescriptions  [name] confirms that the patient's medication has been transferred to the pharmacy close to [facility] home and are available for pick     Goals Addressed             This Visit's Progress    Home health services -[facility] RN CM care coordination   Not on track    Interventions Today    Flowsheet Row Most Recent Value  Chronic Disease   Chronic disease during today's visit Other  [home health, pharmacy]  General Interventions   General Interventions Discussed/Reviewed General Interventions Reviewed, Doctor Visits, [facility], Communication with  [confirmed teh 03/17/23 visit cancelled by provider related to the inclement weather]  Doctor Visits Discussed/Reviewed Doctor Visits Reviewed, PCP, Specialist  PCP/Specialist Visits Compliance with follow-up visit  Communication with PCP/Specialists, Social Work  [sent RN CM note to pcp and Dr Hamilton Capri. Email to pcp Lance Sell requesting a possible new home health order to be faxed to [employer]. Messaged [facility] SW, L Clemons to inquire if patient qualifies for personal care services]  Exercise Interventions   Exercise Discussed/Reviewed Exercise Discussed, Physical Activity  Physical Activity Discussed/Reviewed Physical Activity Reviewed  [Confirmed with Karla Millet that patient is active but not at base line. Patient does some cooking when Daughter is near]  Education Interventions   Provided Verbal Education On [facility], Mental Health/Coping with Illness  [reviewed the home health agencies that referrals were sent to on last week- [facility], [facility], [facility], [facility], [facility]]  Mental Health Interventions   Mental Health Discussed/Reviewed Mental Health Reviewed, Coping Strategies  Nutrition Interventions   Nutrition Discussed/Reviewed Nutrition Discussed, Increasing proteins  Pharmacy Interventions   Pharmacy Dicussed/Reviewed Pharmacy Topics Reviewed, Affording Medications  [Confirmed patient medications has been transferred successfully to the pharmacy closer to the daughter]  Safety Interventions   Safety Discussed/Reviewed Safety Reviewed, Home Safety  Home Safety Contact home health agency, Refer for community resources, Assistive Devices              SDOH assessments and interventions completed:  No     Care Coordination Interventions:  Yes, provided   Follow up plan: Follow up call scheduled for 03/30/23    Encounter Outcome:  Pt. Visit Completed    L. Noelle Penner, RN, BSN, CCM [facility] Care Management Community Coordinator Office number [phone]

## 2023-03-21 NOTE — Patient Instructions (Addendum)
Visit Information  Thank you for taking time to visit with me today. Please don't hesitate to contact me if I can be of assistance to you.   Following are the goals we discussed today:   Goals Addressed             This Visit's Progress    [facility] health services -[facility] RN CM care coordination   Not on track    Interventions Today    Flowsheet Row Most Recent Value  Chronic Disease   Chronic disease during today's visit Other  [home health, pharmacy]  General Interventions   General Interventions Discussed/Reviewed General Interventions Reviewed, Doctor Visits, [facility], Communication with  [confirmed teh 03/17/23 visit cancelled by provider related to the inclement weather]  Doctor Visits Discussed/Reviewed Doctor Visits Reviewed, PCP, Specialist  PCP/Specialist Visits Compliance with follow-up visit  Communication with PCP/Specialists, Social Work  [sent RN CM note to pcp and Dr Hamilton Capri. Email to pcp Lance Sell requesting a possible new home health order to be faxed to [employer]. Messaged [facility] SW, L Clemons to inquire if patient qualifies for personal care services]  Exercise Interventions   Exercise Discussed/Reviewed Exercise Discussed, Physical Activity  Physical Activity Discussed/Reviewed Physical Activity Reviewed  [Confirmed with [name] that patient is active but not at base line. Patient does some cooking when Daughter is near]  Education Interventions   Provided Verbal Education On [facility], Mental Health/Coping with Illness  [reviewed the home health agencies that referrals were sent to on last week- [facility], [facility], [facility], [facility], [facility]]  Mental Health Interventions   Mental Health Discussed/Reviewed Mental Health Reviewed, Coping Strategies  Nutrition Interventions   Nutrition Discussed/Reviewed Nutrition Discussed, Increasing proteins  Pharmacy Interventions   Pharmacy Dicussed/Reviewed Pharmacy Topics Reviewed, Affording  Medications  [Confirmed patient medications has been transferred successfully to the pharmacy closer to the daughter]  Safety Interventions   Safety Discussed/Reviewed Safety Reviewed, Home Safety  Home Safety Contact home health agency, Refer for community resources, Assistive Devices              Our next appointment is by telephone on 03/30/23 at 1130  Please call the care guide team at [phone] if you need to cancel or reschedule your appointment.   If you are experiencing a Mental Health or Behavioral Health Crisis or need someone to talk to, please call the Suicide and Crisis Lifeline: 988 call the [place] National Suicide Prevention Lifeline: 908-686-7330 or TTY: 7655504187 TTY 630-137-7953) to talk to a trained counselor call 1-800-273-TALK (toll free, 24 hour hotline) call the [facility]: [phone] call 911   Patient verbalizes understanding of instructions and care plan provided today and agrees to view in MyChart. Active MyChart status and patient understanding of how to access instructions and care plan via MyChart confirmed with patient.     The patient has been provided with contact information for the care management team and has been advised to call with any health related questions or concerns.     L. Noelle Penner, RN, BSN, CCM [facility] Care Management Community Coordinator Office number [phone]

## 2023-03-22 ENCOUNTER — Ambulatory Visit: Payer: Self-pay | Admitting: *Deleted

## 2023-03-22 DIAGNOSIS — K7031 Alcoholic cirrhosis of liver with ascites: Secondary | ICD-10-CM | POA: Diagnosis not present

## 2023-03-22 DIAGNOSIS — K7682 Hepatic encephalopathy: Secondary | ICD-10-CM | POA: Diagnosis not present

## 2023-03-22 DIAGNOSIS — Z01818 Encounter for other preprocedural examination: Secondary | ICD-10-CM | POA: Diagnosis not present

## 2023-03-22 DIAGNOSIS — I8500 Esophageal varices without bleeding: Secondary | ICD-10-CM | POA: Diagnosis not present

## 2023-03-22 DIAGNOSIS — M6284 Sarcopenia: Secondary | ICD-10-CM | POA: Diagnosis not present

## 2023-03-22 DIAGNOSIS — E440 Moderate protein-calorie malnutrition: Secondary | ICD-10-CM | POA: Diagnosis not present

## 2023-03-22 NOTE — Patient Outreach (Addendum)
  Care Coordination   Collaboration/coordination care   Visit Note   03/22/2023 Name: Karla Young MRN: [id number] DOB: 08-02-1961  Karla Young is a 62 y.o. year old female who sees Wilmon Pali, FNP for primary care. I  spoke with Hailey at pcp office   What matters to the patients health and wellness today?  Spoke with Hailey at  PCP office Patient's pcp is not available A message was taken by Hailey to send to the pcp about pt being discharged without home health, need for new home health orders for [employer]. [facility], [facility], [facility] + others in [place]), need for a hospital follow up visit for patient & personal care services order Left [facility] RN CM & [facility] SW contact information  The pcp office does not have access to EPIC, only "ecu w " Hailey confirmed last office cancelled visit related to inclement weather and has not rescheduled hospital follow up visit    Goals Addressed             This Visit's Progress    Home health services -[facility] RN CM care coordination       Interventions Today    Flowsheet Row Most Recent Value  Chronic Disease   Chronic disease during today's visit Other  General Interventions   General Interventions Discussed/Reviewed General Interventions Reviewed, Communication with  Communication with --  [call to pcp office to follow up on email sent 03/21/23 & after collaboration with [facility] r/t ? eligibility for [facility] services. Spoke with Hailey who will send message to pcp about home health, Personal care services and hospital follow up pcp visit]              SDOH assessments and interventions completed:  No     Care Coordination Interventions:  No, not indicated   Follow up plan: Follow up call scheduled for 03/30/23    Encounter Outcome:  Pt. Visit Completed     L. Noelle Penner, RN, BSN, CCM [facility] Care Management Community Coordinator Office number [phone]

## 2023-03-22 NOTE — Patient Instructions (Addendum)
Visit Information  Thank you for taking time to visit with me today. Please don't hesitate to contact me if I can be of assistance to you.   Following are the goals we discussed today:   Goals Addressed             This Visit's Progress    Home health services -[facility] RN CM care coordination       Interventions Today    Flowsheet Row Most Recent Value  Chronic Disease   Chronic disease during today's visit Other  General Interventions   General Interventions Discussed/Reviewed General Interventions Reviewed, Communication with  Communication with --  [call to pcp office to follow up on email sent 03/21/23 & after collaboration with [facility] r/t ? eligibility for [facility] services. Spoke with Hailey who will send message to pcp about home health, Personal care services and hospital follow up pcp visit]              Our next appointment is by telephone on 03/30/23 at 1130  Please call the care guide team at [phone] if you need to cancel or reschedule your appointment.   If you are experiencing a Mental Health or Behavioral Health Crisis or need someone to talk to, please call the Suicide and Crisis Lifeline: 988 call the [place] National Suicide Prevention Lifeline: 775-787-0415 or TTY: 9257738997 TTY (510)642-3870) to talk to a trained counselor call 1-800-273-TALK (toll free, 24 hour hotline) call the [facility]: [phone] call 911   Patient verbalizes understanding of instructions and care plan provided today and agrees to view in MyChart. Active MyChart status and patient understanding of how to access instructions and care plan via MyChart confirmed with patient.     The patient has been provided with contact information for the care management team and has been advised to call with any health related questions or concerns.     L. Noelle Penner, RN, BSN, CCM [facility] Care Management Community Coordinator Office number [phone]

## 2023-03-23 ENCOUNTER — Ambulatory Visit: Payer: Self-pay | Admitting: *Deleted

## 2023-03-23 NOTE — Patient Outreach (Incomplete)
  Care Coordination   Follow Up Visit Note   03/24/2023 Name: Karla Young MRN: [id number] DOB: Aug 21, 1960  Karla Young is a 62 y.o. year old female who sees Wilmon Pali, FNP for primary care. I spoke with daughter, Karla Young of Karla Young by phone today when she called RN CM  What matters to the patients health and wellness today?  Home or outpatient therapy.   Karla Young would like an order for patient to attend [facility] outpatient program in [place] fax [phone] She has confirmed the office takes [place] & medicaid coverage  Voiced understanding of RN CM interventions on 03/22/23 Need AA appt  Did a telephone appointment with pcp after the office closed for inclement weather    Goals Addressed             This Visit's Progress    Home health services -[facility] RN CM care coordination   Not on track    Interventions Today    Flowsheet Row Most Recent Value  Chronic Disease   Chronic disease during today's visit Other  [home health or outpatient therapy]  General Interventions   General Interventions Discussed/Reviewed General Interventions Reviewed, Doctor Visits, [facility], Communication with  Doctor Visits Discussed/Reviewed Doctor Visits Reviewed, PCP, Specialist  PCP/Specialist Visits Compliance with follow-up visit, Contact provider for referral to  [facility] provider for referral to Specialist  [[facility]]  Communication with PCP/Specialists, RN  [Spoke with Sophia at Dr Hamilton Capri office to leave a message for Karla Young requesting an outpatient order for [facility], fax [phone] provided]  Exercise Interventions   Physical Activity Discussed/Reviewed Physical Activity Reviewed, Home Exercise Program (HEP)  [discussed outpatient therapy services, process to get home health & outpatient therapy]  Education Interventions   Education Provided --  [Explained the process to obtain home and outpatient therapies to include a  face to face, order and notes faxed to the home health and outpatient agencies.  Confirmed medicines are being obtained. Discussed the pcp and Dr Hamilton Capri roles in providing orders]  Provided Verbal Education On [facility]              SDOH assessments and interventions completed:  No     Care Coordination Interventions:  Yes, provided   Follow up plan: Follow up call scheduled for 03/30/23    Encounter Outcome:  Pt. Visit Completed    L. Noelle Penner, RN, BSN, CCM [facility] Care Management Community Coordinator Office number [phone]

## 2023-03-24 ENCOUNTER — Ambulatory Visit: Payer: Self-pay | Admitting: *Deleted

## 2023-03-24 DIAGNOSIS — R188 Other ascites: Secondary | ICD-10-CM | POA: Diagnosis not present

## 2023-03-24 NOTE — Patient Outreach (Addendum)
  Care Coordination   Multidisciplinary Case Review Note    03/24/2023 Name: Karla Young MRN: [id number] DOB: [birth year]  Karla Young is a 62 y.o. year old female who sees Wilmon Pali, FNP for primary care.  The  multidisciplinary care team met today to review patient care needs and barriers.   Barrier: patient needs hospital follow up with primary care provider (PCP) to obtain orders for home health or outpatient therapies to include a face to face form vs Transplant MD, Zam[place] - fax referrals to agencies in [facility] were the patient is residing presently    Spoke with Karla Young when she returned a call to RN CM after a message was left on 03/23/23. Confirmed with Karla Young that the patient was seen by Dr Hamilton Capri on 03/23/23 and forms were provided to him by the daughter for outpatient services with [facility]. Karla Young confirms she will fill out the forms and return them the best of her abilities and voiced understanding that the forms requests completion by a pcp. RN CM offered assistance as needed.        Goals Addressed             This Visit's Progress    Home health services -[facility] RN CM care coordination   Not on track    Interventions Today    Flowsheet Row Most Recent Value  Chronic Disease   Chronic disease during today's visit Other  General Interventions   General Interventions Discussed/Reviewed General Interventions Reviewed, Doctor Visits, [facility], Communication with  Doctor Visits Discussed/Reviewed Doctor Visits Reviewed, PCP, Specialist  PCP/Specialist Visits Contact provider for referral to  [facility] for Dr Hamilton Capri office (called RN CM), [facility] SW/RN multidisciplinary team]  Contacted provider for referral to --  [re faxed Home health referrals to [facility] in [place] Case discussion with [employer] multidisciplinary team]  Communication with --  [Sent referral information to [facility] via EPIC (facesheet/demo, Dr Hamilton Capri  order, [facility]charge [facility] from [facility], Unable to send Dr Hamilton Capri note from care everywhere)]  Education Interventions   Education Provided Provided Education  [importance of the pcp completion of orders with a face to face form to send to referring agencies]  Provided Verbal Education On Other, Development worker, community, [facility]              SDOH assessments and interventions completed:  Yes     Care Coordination Interventions Activated:  Yes   Care Coordination Interventions:  Yes, provided   Follow up plan: Follow up call scheduled for 03/30/23    Multidisciplinary Team Attendees:   Edd Arbour, Demetrios Loll, [facility] Clemons  Scribe for Multidisciplinary Case Review:  Freddi Starr. Noelle Penner, RN, BSN, CCM [facility] Care Management Community Coordinator Office number [phone]

## 2023-03-24 NOTE — Patient Instructions (Addendum)
Visit Information  Thank you for taking time to visit with me today. Please don't hesitate to contact me if I can be of assistance to you.   Following are the goals we discussed today:   Goals Addressed             This Visit's Progress    Home health services -[facility] RN CM care coordination   Not on track    Interventions Today    Flowsheet Row Most Recent Value  Chronic Disease   Chronic disease during today's visit Other  [home health or outpatient therapy]  General Interventions   General Interventions Discussed/Reviewed General Interventions Reviewed, Doctor Visits, [facility], Communication with  Doctor Visits Discussed/Reviewed Doctor Visits Reviewed, PCP, Specialist  PCP/Specialist Visits Compliance with follow-up visit, Contact provider for referral to  [facility] provider for referral to Specialist  [[facility]]  Communication with PCP/Specialists, RN  [Spoke with Sophia at Dr Hamilton Capri office to leave a message for [name] requesting an outpatient order for [facility], fax [phone] provided]  Exercise Interventions   Physical Activity Discussed/Reviewed Physical Activity Reviewed, Home Exercise Program (HEP)  [discussed outpatient therapy services, process to get home health & outpatient therapy]  Education Interventions   Education Provided --  [Explained the process to obtain home and outpatient therapies to include a face to face, order and notes faxed to the home health and outpatient agencies.  Confirmed medicines are being obtained. Discussed the pcp and Dr Hamilton Capri roles in providing orders]  Provided Verbal Education On [facility]              Our next appointment is by telephone on 03/30/23 at 1130  Please call the care guide team at [phone] if you need to cancel or reschedule your appointment.   If you are experiencing a Mental Health or Behavioral Health Crisis or need someone to talk to, please call the Suicide and  Crisis Lifeline: 988 call the [place] National Suicide Prevention Lifeline: 782-012-1936 or TTY: (304)751-4542 TTY 5747369439) to talk to a trained counselor call 1-800-273-TALK (toll free, 24 hour hotline) go to [facility] Urgent Care [address] [place] [phone]) call the [facility] Crisis Line: [phone] call 911   Patient verbalizes understanding of instructions and care plan provided today and agrees to view in MyChart. Active MyChart status and patient understanding of how to access instructions and care plan via MyChart confirmed with patient.     The patient has been provided with contact information for the care management team and has been advised to call with any health related questions or concerns.    L. Noelle Penner, RN, BSN, CCM [facility] Care Management Community Coordinator Office number [phone]

## 2023-03-24 NOTE — Patient Instructions (Addendum)
Visit Information  Thank you for taking time to visit with me today. Please don't hesitate to contact me if I can be of assistance to you.   Following are the goals we discussed today:   Goals Addressed             This Visit's Progress    Home health services -[facility] RN CM care coordination   Not on track    Interventions Today    Flowsheet Row Most Recent Value  Chronic Disease   Chronic disease during today's visit Other  General Interventions   General Interventions Discussed/Reviewed General Interventions Reviewed, Doctor Visits, [facility], Communication with  Doctor Visits Discussed/Reviewed Doctor Visits Reviewed, PCP, Specialist  PCP/Specialist Visits Contact provider for referral to  [facility] for Dr Hamilton Capri office (called RN CM), [facility] SW/RN multidisciplinary team]  Contacted provider for referral to --  [re faxed Home health referrals to [facility], [facility] in [place] Case discussion with [facility] multidisciplinary team]  Communication with --  [Sent referral information to [facility] via EPIC (facesheet/demo, Dr Hamilton Capri order, Discharge note from [facility], Unable to send Dr Hamilton Capri note from care everywhere)]  Education Interventions   Education Provided Provided Education  [importance of the pcp completion of orders with a face to face form to send to referring agencies]  Provided Verbal Education On Other, [employer], [employer]              Our next appointment is by telephone on 03/30/23 at 1130  Please call the care guide team at [phone] if you need to cancel or reschedule your appointment.   If you are experiencing a Mental Health or Behavioral Health Crisis or need someone to talk to, please call the Suicide and Crisis Lifeline: 988 call the [place] National Suicide Prevention Lifeline: 843 569 2170 or TTY: 331-091-9225 TTY 901-714-5807) to talk to a trained counselor call 1-800-273-TALK (toll free, 24 hour  hotline) go to [facility] [address] [place] [phone]) call the [facility] Crisis Line: [phone] call 911   Patient verbalizes understanding of instructions and care plan provided today and agrees to view in MyChart. Active MyChart status and patient understanding of how to access instructions and care plan via MyChart confirmed with patient.     The patient has been provided with contact information for the care management team and has been advised to call with any health related questions or concerns.     L. Noelle Penner, RN, BSN, CCM [facility] Care Management Community Coordinator Office number [phone]

## 2023-03-25 ENCOUNTER — Ambulatory Visit: Payer: Self-pay | Admitting: *Deleted

## 2023-03-25 NOTE — Patient Instructions (Signed)
Visit Information  Thank you for taking time to visit with me today. Please don't hesitate to contact me if I can be of assistance to you.   Following are the goals we discussed today:   Goals Addressed             This Visit's Progress    Home health services -[facility] RN CM care coordination   Not on track    Interventions Today    Flowsheet Row Most Recent Value  Chronic Disease   Chronic disease during today's visit Other  [home services, voice message form pcp staff, Tommie Ard  with inquiry of home health services needed. leaving # [phone] fax [phone]]  General Interventions   PCP/Specialist Visits Contact provider for referral to  [fax Dr Hamilton Capri orginal order (PT, OT, aide) via EPIC routing action to ATTN Evette Lucas& pcp. Requested assist with a new HH order including a required cms face to face for home health & [place] be [place] [facility]request a PCS order]  Contacted provider for referral to --  [facility] health, personal care services sent RN CM office number to pcp Previously left a message for pcp with Hailey this week also]  Communication with PCP/Specialists, Social Work  [updated [facility] SW, L Clemons]              Our next appointment is by telephone on 03/30/23 at 1130  Please call the care guide team at [phone] if you need to cancel or reschedule your appointment.   If you are experiencing a Mental Health or Behavioral Health Crisis or need someone to talk to, please call the Suicide and Crisis Lifeline: 988 call the [place] National Suicide Prevention Lifeline: 214-118-5638 or TTY: 971-317-0639 TTY 629 498 7894) to talk to a trained counselor call 1-800-273-TALK (toll free, 24 hour hotline) go to [facility] [address] [place] [phone]) call the [facility] Crisis Line: 4097005079 call 911 Seek assist at a [facility] agency    Patient verbalizes understanding of  instructions and care plan provided today and agrees to view in MyChart. Active MyChart status and patient understanding of how to access instructions and care plan via MyChart confirmed with patient.     The patient has been provided with contact information for the care management team and has been advised to call with any health related questions or concerns.   Briyan Kleven L. Noelle Penner, RN, BSN, CCM [facility] Care Management Community Coordinator Office number [phone]

## 2023-03-25 NOTE — Patient Outreach (Addendum)
  Care Coordination   Care coordination for home services  Visit Note   03/25/2023 Name: Karla Young MRN: [id number] DOB: 05-Mar-1961  Karla Young is a 62 y.o. year old female who sees Wilmon Pali, FNP for primary care. I  received a message left by Tommie Ard of the pcp office, collaborated with [facility] SW  What matters to the patients health and wellness today?  Home care services     Goals Addressed             This Visit's Progress    Home health services -[facility] RN CM care coordination   Not on track    Interventions Today    Flowsheet Row Most Recent Value  Chronic Disease   Chronic disease during today's visit Other  [home services, voice message form pcp staff, Tommie Ard  with inquiry of home health services needed. leaving # [phone] fax [phone]]  General Interventions   PCP/Specialist Visits Contact provider for referral to  [fax Dr Hamilton Capri orginal order (PT, OT, aide) via EPIC routing action to ATTN Evette Lucas& pcp. Requested assist with a new HH order including a required cms face to face for home health & referral be sent [place] [facility]request a PCS order]  Contacted provider for referral to --  [facility] health, personal care services sent RN CM office number to pcp Previously left a message for pcp with Hailey this week also]  Communication with PCP/Specialists, Social Work  [updated [facility] SW, L Clemons]              SDOH assessments and interventions completed:  No     Care Coordination Interventions:  Yes, provided   Follow up plan: Follow up call scheduled for 03/30/23    Encounter Outcome:  Pt. Visit Completed   Gennaro Lizotte L. Noelle Penner, RN, BSN, CCM [facility] Care Management Community Coordinator Office number [phone]

## 2023-03-29 ENCOUNTER — Ambulatory Visit: Payer: Self-pay | Admitting: *Deleted

## 2023-03-29 DIAGNOSIS — Z01818 Encounter for other preprocedural examination: Secondary | ICD-10-CM | POA: Diagnosis not present

## 2023-03-29 NOTE — Patient Outreach (Signed)
  Care Coordination   Follow Up Visit Note   04/06/2023 late entry for 03/29/23 Name: Karla Young MRN: [id number] DOB: 1961-06-20  Karla Young is a 62 y.o. year old female who sees Wilmon Pali, FNP for primary care. I spoke with daughter, Karla Young of Karla Young by phone today.  What matters to the patients health and wellness today?  RN CM received a message from daughter.  Out reach to [facility] about therapy referral  Personal care services are still wanted  Pending upcoming MD appt   Activities of daily living -Doing well Able to dress, bath & feed independently  Discussed types of medicaid Does AA online    Goals Addressed             This Visit's Progress    Therapy, AA program, medicaid, personal care services - RN CM care coordination   On track    Interventions Today    Flowsheet Row Most Recent Value  Chronic Disease   Chronic disease during today's visit Other  [outpatient therapy, personal care services, Medicaid , AA program]  General Interventions   General Interventions Discussed/Reviewed General Interventions Reviewed, [facility], Doctor Visits, Communication with  Doctor Visits Discussed/Reviewed Doctor Visits Reviewed, Specialist  [attending AA online program]  PCP/Specialist Visits Compliance with follow-up visit  Communication with PCP/Specialists  Merchant navy officer to [employer] staff Glenetta Hew about outpatient therapy,]  Exercise Interventions   Exercise Discussed/Reviewed Exercise Reviewed, Physical Activity  [assessed for activities of daily living/independence]  Physical Activity Discussed/Reviewed Physical Activity Reviewed  Education Interventions   Education Provided Provided Education  [outpatient therapy, personal care services, types of medicaid]  Provided Verbal Education On [employer], Other  Mental Health Interventions   Mental Health Discussed/Reviewed Mental Health Reviewed, Coping Strategies, Substance Abuse               SDOH assessments and interventions completed:  No     Care Coordination Interventions:  Yes, provided   Follow up plan: Follow up call scheduled for 04/06/23    Encounter Outcome:  Pt. Visit Completed   Emmanuelle Coxe L. Noelle Penner, RN, BSN, CCM, Care Management Coordinator [phone]

## 2023-03-30 ENCOUNTER — Encounter: Payer: 59 | Admitting: *Deleted

## 2023-03-31 DIAGNOSIS — R188 Other ascites: Secondary | ICD-10-CM | POA: Diagnosis not present

## 2023-04-01 DIAGNOSIS — Z87891 Personal history of nicotine dependence: Secondary | ICD-10-CM | POA: Diagnosis not present

## 2023-04-01 DIAGNOSIS — Z5309 Procedure and treatment not carried out because of other contraindication: Secondary | ICD-10-CM | POA: Diagnosis not present

## 2023-04-01 DIAGNOSIS — Z811 Family history of alcohol abuse and dependence: Secondary | ICD-10-CM | POA: Diagnosis not present

## 2023-04-01 DIAGNOSIS — Z7682 Awaiting organ transplant status: Secondary | ICD-10-CM | POA: Diagnosis not present

## 2023-04-01 DIAGNOSIS — Z01818 Encounter for other preprocedural examination: Secondary | ICD-10-CM | POA: Diagnosis not present

## 2023-04-01 DIAGNOSIS — D759 Disease of blood and blood-forming organs, unspecified: Secondary | ICD-10-CM | POA: Diagnosis not present

## 2023-04-01 DIAGNOSIS — R569 Unspecified convulsions: Secondary | ICD-10-CM | POA: Diagnosis not present

## 2023-04-01 DIAGNOSIS — K219 Gastro-esophageal reflux disease without esophagitis: Secondary | ICD-10-CM | POA: Diagnosis not present

## 2023-04-01 DIAGNOSIS — Z90711 Acquired absence of uterus with remaining cervical stump: Secondary | ICD-10-CM | POA: Diagnosis not present

## 2023-04-01 DIAGNOSIS — E039 Hypothyroidism, unspecified: Secondary | ICD-10-CM | POA: Diagnosis not present

## 2023-04-01 DIAGNOSIS — Z79890 Hormone replacement therapy (postmenopausal): Secondary | ICD-10-CM | POA: Diagnosis not present

## 2023-04-01 DIAGNOSIS — Z7951 Long term (current) use of inhaled steroids: Secondary | ICD-10-CM | POA: Diagnosis not present

## 2023-04-01 DIAGNOSIS — K7031 Alcoholic cirrhosis of liver with ascites: Secondary | ICD-10-CM | POA: Diagnosis not present

## 2023-04-01 DIAGNOSIS — F1021 Alcohol dependence, in remission: Secondary | ICD-10-CM | POA: Diagnosis not present

## 2023-04-01 DIAGNOSIS — Z79899 Other long term (current) drug therapy: Secondary | ICD-10-CM | POA: Diagnosis not present

## 2023-04-01 DIAGNOSIS — J449 Chronic obstructive pulmonary disease, unspecified: Secondary | ICD-10-CM | POA: Diagnosis not present

## 2023-04-01 DIAGNOSIS — R001 Bradycardia, unspecified: Secondary | ICD-10-CM | POA: Diagnosis not present

## 2023-04-01 DIAGNOSIS — K766 Portal hypertension: Secondary | ICD-10-CM | POA: Diagnosis not present

## 2023-04-01 DIAGNOSIS — K589 Irritable bowel syndrome without diarrhea: Secondary | ICD-10-CM | POA: Diagnosis not present

## 2023-04-01 DIAGNOSIS — K3189 Other diseases of stomach and duodenum: Secondary | ICD-10-CM | POA: Diagnosis not present

## 2023-04-01 DIAGNOSIS — Z9049 Acquired absence of other specified parts of digestive tract: Secondary | ICD-10-CM | POA: Diagnosis not present

## 2023-04-01 DIAGNOSIS — K7210 Chronic hepatic failure without coma: Secondary | ICD-10-CM | POA: Diagnosis not present

## 2023-04-01 DIAGNOSIS — M797 Fibromyalgia: Secondary | ICD-10-CM | POA: Diagnosis not present

## 2023-04-06 ENCOUNTER — Ambulatory Visit: Payer: Self-pay | Admitting: *Deleted

## 2023-04-06 DIAGNOSIS — R188 Other ascites: Secondary | ICD-10-CM | POA: Diagnosis not present

## 2023-04-06 DIAGNOSIS — R8569 Abnormal cytological findings in specimens from other digestive organs and abdominal cavity: Secondary | ICD-10-CM | POA: Diagnosis not present

## 2023-04-06 DIAGNOSIS — K7460 Unspecified cirrhosis of liver: Secondary | ICD-10-CM | POA: Diagnosis not present

## 2023-04-06 NOTE — Patient Instructions (Addendum)
Visit Information  Thank you for taking time to visit with me today. Please don't hesitate to contact me if I can be of assistance to you.       Following are the patient goals we discussed today:  [employer] Member services [phone] - Please call to request assist with setting up transportation benefits plus get information about her transportation for 2024  Also please ask customer services to e-mail you the copies of the [facility] hospitality Reimbursement  Please call the [facility] Transportation Unit- [phone]  Review the following county [facility]) resource websites:  [email or web address]   [email or web address]     Goals Addressed             This Visit's Progress    Therapy, transportation services - RN CM care coordination   On track    Interventions Today    Flowsheet Row Most Recent Value  Chronic Disease   Chronic disease during today's visit Chronic Obstructive Pulmonary Disease (COPD), Hypertension (HTN), Chronic Kidney Disease/End Stage Renal Disease (ESRD), Other  [home health/Outpatient services, liver transplant, transportation, [employer] resources]  General Interventions   General Interventions Discussed/Reviewed General Interventions Reviewed, [employer], Doctor Visits, Communication with  Doctor Visits Discussed/Reviewed Doctor Visits Reviewed, Specialist  PCP/Specialist Visits Compliance with follow-up visit  Communication with PCP/Specialists  [sent referral via EPIC to [facility] home services]  Exercise Interventions   Exercise Discussed/Reviewed Exercise Reviewed, Physical Activity  [Confirmed patient is close to her baseline for activity of daily living]  Physical Activity Discussed/Reviewed Physical Activity Reviewed  Earnest Bailey for follow up with outtpatient therapy ([facility])]  Education  Interventions   Education Provided Provided Education  [employer] [place] resources, medicaid & [place] transportation, discussed Community education officer hospitalty reimbursement]  Provided Verbal Education On Mental Health/Coping with Illness, [employer], Development worker, community  [attempted to find [employer] forms without success. Encouraged [name] to inquire about this when she call about the transportation benefits]  Mental Health Interventions   Mental Health Discussed/Reviewed Mental Health Reviewed, Coping Strategies              Our next appointment is by telephone on 04/15/23 at 1:45 pm  Please call the care guide team at [phone] if you need to cancel or reschedule your appointment.   If you are experiencing a Mental Health or Behavioral Health Crisis or need someone to talk to, please call the Suicide and Crisis Lifeline: 988 call the [place] National Suicide Prevention Lifeline: 660 836 5388 or TTY: 509 741 6937 TTY 3125179972) to talk to a trained counselor call 1-800-273-TALK (toll free, 24 hour hotline) call 911 Call [facility] behavioral health services     Patient verbalizes understanding of instructions and care plan provided today and agrees to view in [place]. Active MyChart status and patient understanding of how to access instructions and care plan via MyChart confirmed with patient.     The patient has been provided with contact information for the care management team and has been advised to call with any health related questions or concerns.   Asley Baskerville L. Noelle Penner, RN, BSN, CCM [facility] Care Management Community Coordinator Office number [phone]

## 2023-04-06 NOTE — Patient Instructions (Addendum)
Visit Information  Thank you for taking time to visit with me today. Please don't hesitate to contact me if I can be of assistance to you.   Following are the goals we discussed today:   Goals Addressed             This Visit's Progress    Therapy, AA program, medicaid, personal care services - RN CM care coordination   On track    Interventions Today    Flowsheet Row Most Recent Value  Chronic Disease   Chronic disease during today's visit Other  [outpatient therapy, personal care services, Medicaid , AA program]  General Interventions   General Interventions Discussed/Reviewed General Interventions Reviewed, [facility], Doctor Visits, Communication with  Doctor Visits Discussed/Reviewed Doctor Visits Reviewed, Specialist  [attending AA online program]  PCP/Specialist Visits Compliance with follow-up visit  Communication with PCP/Specialists  Merchant navy officer to [employer] staff Glenetta Hew about outpatient therapy,]  Exercise Interventions   Exercise Discussed/Reviewed Exercise Reviewed, Physical Activity  [assessed for activities of daily living/independence]  Physical Activity Discussed/Reviewed Physical Activity Reviewed  Education Interventions   Education Provided Provided Education  [outpatient therapy, personal care services, types of medicaid]  Provided Verbal Education On [employer], Other  Mental Health Interventions   Mental Health Discussed/Reviewed Mental Health Reviewed, Coping Strategies, Substance Abuse              Our next appointment is by telephone on 04/06/23 at 2 pm  Please call the care guide team at [phone] if you need to cancel or reschedule your appointment.   If you are experiencing a Mental Health or Behavioral Health Crisis or need someone to talk to, please call the Suicide and Crisis Lifeline: 988 call the [place] National Suicide Prevention Lifeline: 740-140-4089 or TTY: (315) 582-6812 TTY 431-245-0432) to talk to a trained  counselor call 1-800-273-TALK (toll free, 24 hour hotline) call the [facility]: (806)488-3851 call 911   Patient verbalizes understanding of instructions and care plan provided today and agrees to view in MyChart. Active MyChart status and patient understanding of how to access instructions and care plan via MyChart confirmed with patient.     The patient has been provided with contact information for the care management team and has been advised to call with any health related questions or concerns.   Paris Hohn L. Noelle Penner, RN, BSN, CCM, Care Management Coordinator [phone]

## 2023-04-06 NOTE — Patient Outreach (Addendum)
Care Coordination   Follow Up Visit Note   04/06/2023 Name: Karla Young MRN: [id number] DOB: 03/17/1961  Karla Young is a 62 y.o. year old female who sees Wilmon Pali, FNP for primary care. I spoke with  [name] by phone today.  What matters to the patients health and wellness today?  Presently patient is getting a paracentesis at [facility]  Patient offer a transplant on 04/01/23 but there were fibroids found.  Daughter has not had the opportunity to return a call to [facility]  to schedule the first outpatient therapy visit. Daughter believes patient may not need home health [name] reports patient is near her baseline for  Activities of daily living (ADLs). States patient can feed herself, can cook with supervision, can bath/dress, & is mobile not homebound   [name] discussed and requested a home health referral to be faxed to [facility] [address], [address] [place], [place] [address] [email or web address] Tel: [phone] Fax: [phone] [email or web address] Voiced appreciation for the sent referral  Patient had a call on 04/05/23 from [facility] that is reported to be confusing. She informed [name] it was about not being able to have 2 insurance coverage.  [name] confirms the transplant encouraged no changes to the [facility] nor [employer] medicare coverage at this time.   Still need transportation resources needed as daughter is transporting patient in between of working and caring for her 2 daughters Voiced understanding that most transportation services will not assist the patient out of the home. [name] confirms the patient is able to get out of the home independently   Voiced the understanding that the [employer] transportation does not go out of [employer] confirmed most of patient appointments are now in [place] except when patient needs to be transported to [employer] MDs  Tomorrow appointment with podiatry  that [name] will be able to provide transport   [employer] - [name] inquires if RN CM is aware of how to get [employer] forms. She states the patient had forms for reimbursement from [place] but can not find them. On last week [name] was set up to stay in hospitality home but the patient was not able to receive the liver Voiced understanding that RN CM was not able to find them with a search today but will ask [employer] customer services when she calls about [employer] transportation benefits   Goals Addressed             This Visit's Progress    Therapy, transportation services - RN CM care coordination   On track    Interventions Today    Flowsheet Row Most Recent Value  Chronic Disease   Chronic disease during today's visit Chronic Obstructive Pulmonary Disease (COPD), Hypertension (HTN), Chronic Kidney Disease/End Stage Renal Disease (ESRD), Other  [home health/Outpatient services, liver transplant, transportation, [employer] resources]  General Interventions   General Interventions Discussed/Reviewed General Interventions Reviewed, [facility], Doctor Visits, Communication with  Doctor Visits Discussed/Reviewed Doctor Visits Reviewed, Specialist  PCP/Specialist Visits Compliance with follow-up visit  Communication with PCP/Specialists  [sent referral via EPIC to [facility] home services]  Exercise Interventions   Exercise Discussed/Reviewed Exercise Reviewed, Physical Activity  [Confirmed patient is close to her baseline for activity of daily living]  Physical Activity Discussed/Reviewed Physical Activity Reviewed  Earnest Bailey for follow up with outtpatient therapy ([facility])]  Education Interventions   Education Provided Provided Education  [employer] resources, [employer] & [place] transportation,  discussed aetna hospitalty reimbursement]  Provided Verbal Education On Mental Health/Coping with Illness, [facility], Development worker, community  [attempted  to find [employer] forms without success. Encouraged [name] to inquire about this when she call about the transportation benefits]  Mental Health Interventions   Mental Health Discussed/Reviewed Mental Health Reviewed, Coping Strategies              SDOH assessments and interventions completed:  Yes  SDOH Interventions Today    Flowsheet Row Most Recent Value  SDOH Interventions   Transportation Interventions Intervention Not Indicated  Financial Strain Interventions Intervention Not Indicated  Social Connections Interventions Intervention Not Indicated        Care Coordination Interventions:  Yes, provided   Follow up plan: Follow up call scheduled for 04/15/23    Encounter Outcome:  Pt. Visit Completed     Onnie Hatchel L. Noelle Penner, RN, BSN, CCM, Care Management Coordinator [phone]

## 2023-04-07 DIAGNOSIS — L600 Ingrowing nail: Secondary | ICD-10-CM | POA: Diagnosis not present

## 2023-04-14 ENCOUNTER — Ambulatory Visit: Payer: Self-pay | Admitting: *Deleted

## 2023-04-14 DIAGNOSIS — K7290 Hepatic failure, unspecified without coma: Secondary | ICD-10-CM | POA: Diagnosis not present

## 2023-04-14 DIAGNOSIS — Z0000 Encounter for general adult medical examination without abnormal findings: Secondary | ICD-10-CM | POA: Diagnosis not present

## 2023-04-14 DIAGNOSIS — R188 Other ascites: Secondary | ICD-10-CM | POA: Diagnosis not present

## 2023-04-14 DIAGNOSIS — K7031 Alcoholic cirrhosis of liver with ascites: Secondary | ICD-10-CM | POA: Diagnosis not present

## 2023-04-14 NOTE — Patient Outreach (Signed)
  Care Coordination   Outreach to [name] staff  Visit Note   04/14/2023 Name: Elizandra Killilea MRN: [id number] DOB: April 21, 1961  Afiya Everhart is a 62 y.o. year old female who sees Wilmon Pali, FNP for primary care. I  outreached and spoke with casey case manager at [facility] office   What matters to the patients health and wellness today?  Personal care services     Goals Addressed             This Visit's Progress    Assist with personal care services,  Therapy, AA program, medicaid - RN CM care coordination       Interventions Today    Flowsheet Row Most Recent Value  Chronic Disease   Chronic disease during today's visit Other  [personal care services]  General Interventions   General Interventions Discussed/Reviewed General Interventions Reviewed, Communication with  Contacted provider for referral to Specialist  [personal care services]  Communication with PCP/Specialists, RN  [spoke with Oswaldo Done family medicine case manager to obtain assistance for personal care services. Updated L Clemons, care coordinator social worker]              SDOH assessments and interventions completed:  No     Care Coordination Interventions:  Yes, provided   Follow up plan: Follow up call scheduled for 04/15/23    Encounter Outcome:  Patient Visit Completed   [name], RN, BSN, CCM, Care Management Coordinator [phone]

## 2023-04-14 NOTE — Patient Instructions (Signed)
Visit Information  Thank you for taking time to visit with me today. Please don't hesitate to contact me if I can be of assistance to you.   Following are the goals we discussed today:   Goals Addressed             This Visit's Progress    Assist with personal care services,  Therapy, AA program, medicaid - RN CM care coordination       Interventions Today    Flowsheet Row Most Recent Value  Chronic Disease   Chronic disease during today's visit Other  [personal care services]  General Interventions   General Interventions Discussed/Reviewed General Interventions Reviewed, Communication with  Contacted provider for referral to Specialist  [personal care services]  Communication with PCP/Specialists, RN  [spoke with Oswaldo Done family medicine case manager to obtain assistance for personal care services. Updated L Clemons, care coordinator social worker]              Our next appointment is by telephone on 04/15/23 at 1:45 pm  Please call the care guide team at [phone] if you need to cancel or reschedule your appointment.   If you are experiencing a Mental Health or Behavioral Health Crisis or need someone to talk to, please call the Suicide and Crisis Lifeline: 988 call the [place] National Suicide Prevention Lifeline: 202-267-8222 or TTY: 906-210-9385 TTY 4502653652) to talk to a trained counselor call 1-800-273-TALK (toll free, 24 hour hotline) go to [facility] [address] [place] [phone]) call the [facility] Crisis Line: [phone] call 911   Patient verbalizes understanding of instructions and care plan provided today and agrees to view in MyChart. Active MyChart status and patient understanding of how to access instructions and care plan via MyChart confirmed with patient.     The patient has been provided with contact information for the care management team and has been advised to call with any health  related questions or concerns.   Maleak Brazzel L. Noelle Penner, RN, BSN, CCM, Care Management Coordinator [phone]

## 2023-04-15 ENCOUNTER — Ambulatory Visit: Payer: Self-pay | Admitting: *Deleted

## 2023-04-15 NOTE — Patient Outreach (Signed)
  Care Coordination   Follow Up Visit Note   11/08/2023 updated note for 04/15/23 Name: Karla Young MRN: [id number] DOB: 08-05-1961  Karla Young is a 62 y.o. year old female who sees Karla Pali, FNP for primary care. I spoke with daughter, Karla Young of  Karla Young by phone today.  What matters to the patients health and wellness today?  Personal care services, therapy  Personal care services- Karla Young confirms they have already received an outreach from [facility] for a home visit  Therapy They have heard from therapy    Goals Addressed             This Visit's Progress    Assist with personal care services,  Therapy, AA program, medicaid - RN CM care coordination       Interventions Today    Flowsheet Row Most Recent Value  Chronic Disease   Chronic disease during today's visit Other  [personal care services, therapy,]  General Interventions   General Interventions Discussed/Reviewed General Interventions Reviewed, [facility], Doctor Visits  Doctor Visits Discussed/Reviewed Doctor Visits Reviewed, Specialist  PCP/Specialist Visits Compliance with follow-up visit  Mental Health Interventions   Mental Health Discussed/Reviewed Mental Health Reviewed, Coping Strategies              SDOH assessments and interventions completed:  No     Care Coordination Interventions:  Yes, provided   Follow up plan: Follow up call scheduled for 04/19/23    Encounter Outcome:  Patient Visit Completed    Karla Bradford L. Noelle Penner, RN, BSN, CCM, Care Management Coordinator [phone]

## 2023-04-19 ENCOUNTER — Ambulatory Visit: Payer: Self-pay | Admitting: *Deleted

## 2023-04-19 NOTE — Patient Outreach (Addendum)
  Care Coordination   Collaboration with [name] RN CM  Visit Note   04/19/2023 Name: Karla Young MRN: [id number] DOB: 01/20/1961  Karla Young is a 62 y.o. year old female who sees Wilmon Pali, FNP for primary care. I  received a call from Karla Young, [facility] medicine case manager  What matters to the patients health and wellness today?  Updated that LIFTSS forms completed and faxed on 04/18/23    Goals Addressed             This Visit's Progress    Assist with personal care services,  Therapy, AA program, medicaid - RN CM care coordination   On track    Interventions Today    Flowsheet Row Most Recent Value  Chronic Disease   Chronic disease during today's visit Other  [Pcp confirmed LIFT SS forms completed]  General Interventions   General Interventions Discussed/Reviewed Communication with  Communication with RN  Karla Young family case manager, called confirm LIFTSS were completed and sent on 04/18/23]              SDOH assessments and interventions completed:  No     Care Coordination Interventions:  Yes, provided   Follow up plan: Follow up call scheduled for 04/29/23    Encounter Outcome:  Patient Visit Completed   Karla Bradford L. Noelle Penner, RN, BSN, CCM, Care Management Coordinator [phone]

## 2023-04-19 NOTE — Patient Instructions (Signed)
Visit Information  Thank you for taking time to visit with me today. Please don't hesitate to contact me if I can be of assistance to you.   Following are the goals we discussed today:   Goals Addressed             This Visit's Progress    Assist with personal care services,  Therapy, AA program, medicaid - RN CM care coordination   On track    Interventions Today    Flowsheet Row Most Recent Value  Chronic Disease   Chronic disease during today's visit Other  [Pcp confirmed LIFT SS forms completed]  General Interventions   General Interventions Discussed/Reviewed Communication with  Communication with RN  Oswaldo Done family case manager, called confirm LIFTSS were completed and sent on 04/18/23]              Our next appointment is by telephone on 05/03/23 at 2:45 pm  Please call the care guide team at [phone] if you need to cancel or reschedule your appointment.   If you are experiencing a Mental Health or Behavioral Health Crisis or need someone to talk to, please call the Suicide and Crisis Lifeline: 988 call the [place] National Suicide Prevention Lifeline: 539-774-4285 or TTY: (334)719-6917 TTY 442 176 1431) to talk to a trained counselor call 1-800-273-TALK (toll free, 24 hour hotline) go to [facility] [address], [place] [phone]) call the [facility]: [phone] call 911   Print copy of patient instructions, educational materials, and care plan provided in person.   The patient has been provided with contact information for the care management team and has been advised to call with any health related questions or concerns.   Mccayla Shimada L. Noelle Penner, RN, BSN, CCM, Care Management Coordinator [phone]

## 2023-04-21 DIAGNOSIS — Z0000 Encounter for general adult medical examination without abnormal findings: Secondary | ICD-10-CM | POA: Diagnosis not present

## 2023-04-21 DIAGNOSIS — K7031 Alcoholic cirrhosis of liver with ascites: Secondary | ICD-10-CM | POA: Diagnosis not present

## 2023-04-21 DIAGNOSIS — R188 Other ascites: Secondary | ICD-10-CM | POA: Diagnosis not present

## 2023-04-27 DIAGNOSIS — K7460 Unspecified cirrhosis of liver: Secondary | ICD-10-CM | POA: Diagnosis not present

## 2023-04-27 DIAGNOSIS — R188 Other ascites: Secondary | ICD-10-CM | POA: Diagnosis not present

## 2023-04-27 DIAGNOSIS — Z0000 Encounter for general adult medical examination without abnormal findings: Secondary | ICD-10-CM | POA: Diagnosis not present

## 2023-04-28 DIAGNOSIS — Z48817 Encounter for surgical aftercare following surgery on the skin and subcutaneous tissue: Secondary | ICD-10-CM | POA: Diagnosis not present

## 2023-05-03 ENCOUNTER — Ambulatory Visit: Payer: 59 | Admitting: *Deleted

## 2023-05-03 NOTE — Patient Outreach (Signed)
  Care Coordination   Follow Up Visit Note   11/08/2023 updated note for 05/03/23 Name: Karla Young MRN: [id number] DOB: [birth year]  Karla Young is a 62 y.o. year old female who sees Wilmon Pali, FNP for primary care. I spoke with  Karla Young by phone today.  What matters to the patients health and wellness today?  Karla Young LIFT 7-10 day At baseline per daughter  Now in a routine at daughter home  Going out with friends Taking her own shower  Doing more things independently, doing own med box  Spoke about being ugly and redirected by daughter Karla Young around block recently  Liver condition- stable but still on transplant list Score 17   Not thinking of relocating back home yet Grand father take her to paracentesis    Goals Addressed             This Visit's Progress    Assist with personal care services,  Therapy, AA program, medicaid - RN CM care coordination       Interventions Today    Flowsheet Row Most Recent Value  Chronic Disease   Chronic disease during today's visit Other  General Interventions   General Interventions Discussed/Reviewed General Interventions Reviewed, Doctor Visits, Community Resources  Doctor Visits Discussed/Reviewed Doctor Visits Reviewed, PCP, Specialist  PCP/Specialist Visits Compliance with follow-up visit  Exercise Interventions   Exercise Discussed/Reviewed Exercise Reviewed, Physical Activity  [walked around the block recently - went out with friends]  Physical Activity Discussed/Reviewed Physical Activity Reviewed, Types of exercise, Home Exercise Program (HEP)  Education Interventions   Education Provided Provided Education  Provided Verbal Education On [employer]  Mental Health Interventions   Mental Health Discussed/Reviewed Mental Health Reviewed, Coping Strategies, Anxiety, Other  [improvements reports]  Pharmacy Interventions   Pharmacy Dicussed/Reviewed Pharmacy Topics Reviewed, Affording Medications  Safety Interventions    Safety Discussed/Reviewed Safety Reviewed, Fall Risk, Home Safety  Home Safety Assistive Devices              SDOH assessments and interventions completed:  No     Care Coordination Interventions:  Yes, provided   Follow up plan: No further intervention required.   Encounter Outcome:  Patient Visit Completed   [name], RN, BSN, CCM, Care Management Coordinator [phone]

## 2023-05-05 DIAGNOSIS — Z0000 Encounter for general adult medical examination without abnormal findings: Secondary | ICD-10-CM | POA: Diagnosis not present

## 2023-05-11 DIAGNOSIS — R188 Other ascites: Secondary | ICD-10-CM | POA: Diagnosis not present

## 2023-05-11 DIAGNOSIS — L03116 Cellulitis of left lower limb: Secondary | ICD-10-CM | POA: Diagnosis not present

## 2023-05-11 DIAGNOSIS — K7460 Unspecified cirrhosis of liver: Secondary | ICD-10-CM | POA: Diagnosis not present

## 2023-05-11 DIAGNOSIS — I132 Hypertensive heart and chronic kidney disease with heart failure and with stage 5 chronic kidney disease, or end stage renal disease: Secondary | ICD-10-CM | POA: Diagnosis not present

## 2023-05-18 DIAGNOSIS — R188 Other ascites: Secondary | ICD-10-CM | POA: Diagnosis not present

## 2023-05-24 DIAGNOSIS — K7031 Alcoholic cirrhosis of liver with ascites: Secondary | ICD-10-CM | POA: Diagnosis not present

## 2023-05-24 DIAGNOSIS — E440 Moderate protein-calorie malnutrition: Secondary | ICD-10-CM | POA: Diagnosis not present

## 2023-05-24 DIAGNOSIS — K7682 Hepatic encephalopathy: Secondary | ICD-10-CM | POA: Diagnosis not present

## 2023-05-25 DIAGNOSIS — R188 Other ascites: Secondary | ICD-10-CM | POA: Diagnosis not present

## 2023-05-25 DIAGNOSIS — K7460 Unspecified cirrhosis of liver: Secondary | ICD-10-CM | POA: Diagnosis not present

## 2023-05-30 DIAGNOSIS — Z01810 Encounter for preprocedural cardiovascular examination: Secondary | ICD-10-CM | POA: Diagnosis not present

## 2023-06-01 DIAGNOSIS — Z79899 Other long term (current) drug therapy: Secondary | ICD-10-CM | POA: Diagnosis not present

## 2023-06-01 DIAGNOSIS — K7210 Chronic hepatic failure without coma: Secondary | ICD-10-CM | POA: Diagnosis not present

## 2023-06-01 DIAGNOSIS — Z01818 Encounter for other preprocedural examination: Secondary | ICD-10-CM | POA: Diagnosis not present

## 2023-06-01 DIAGNOSIS — K7031 Alcoholic cirrhosis of liver with ascites: Secondary | ICD-10-CM | POA: Diagnosis not present

## 2023-06-02 DIAGNOSIS — R188 Other ascites: Secondary | ICD-10-CM | POA: Diagnosis not present

## 2023-06-02 DIAGNOSIS — Z79899 Other long term (current) drug therapy: Secondary | ICD-10-CM | POA: Diagnosis not present

## 2023-06-02 DIAGNOSIS — K7210 Chronic hepatic failure without coma: Secondary | ICD-10-CM | POA: Diagnosis not present

## 2023-06-02 DIAGNOSIS — K7031 Alcoholic cirrhosis of liver with ascites: Secondary | ICD-10-CM | POA: Diagnosis not present

## 2023-06-08 ENCOUNTER — Other Ambulatory Visit (HOSPITAL_COMMUNITY): Payer: Self-pay | Admitting: Nurse Practitioner

## 2023-06-08 DIAGNOSIS — Z0000 Encounter for general adult medical examination without abnormal findings: Secondary | ICD-10-CM | POA: Diagnosis not present

## 2023-06-08 DIAGNOSIS — R188 Other ascites: Secondary | ICD-10-CM

## 2023-06-09 ENCOUNTER — Encounter (HOSPITAL_COMMUNITY): Payer: Self-pay | Admitting: Nurse Practitioner

## 2023-06-09 DIAGNOSIS — R188 Other ascites: Secondary | ICD-10-CM

## 2023-06-15 ENCOUNTER — Encounter (HOSPITAL_COMMUNITY): Payer: Self-pay | Admitting: Nurse Practitioner

## 2023-06-15 ENCOUNTER — Ambulatory Visit (HOSPITAL_COMMUNITY)
Admission: RE | Admit: 2023-06-15 | Discharge: 2023-06-15 | Disposition: A | Discharge status: Home / Self Care | Payer: 59 | Source: Ambulatory Visit | Attending: Nurse Practitioner | Admitting: Nurse Practitioner

## 2023-06-15 ENCOUNTER — Encounter (HOSPITAL_COMMUNITY): Payer: Self-pay

## 2023-06-15 DIAGNOSIS — R188 Other ascites: Secondary | ICD-10-CM | POA: Diagnosis not present

## 2023-06-15 DIAGNOSIS — K7460 Unspecified cirrhosis of liver: Secondary | ICD-10-CM | POA: Diagnosis not present

## 2023-06-15 LAB — BODY FLUID CELL COUNT WITH DIFFERENTIAL
Eos, Fluid: 0 %
Lymphs, Fluid: 64 %
Monocyte-Macrophage-Serous Fluid: 36 % — ABNORMAL LOW (ref 50–90)
Neutrophil Count, Fluid: 0 % (ref 0–25)
Total Nucleated Cell Count, Fluid: 79 uL (ref 0–1000)

## 2023-06-15 LAB — GRAM STAIN

## 2023-06-15 MED ORDER — ALBUMIN HUMAN 25 % IV SOLN
INTRAVENOUS | Status: AC
  Filled 2023-06-15: qty 100

## 2023-06-15 MED ORDER — LIDOCAINE HCL (PF) 2 % IJ SOLN
10.0000 mL | Freq: Once | INTRAMUSCULAR | Status: AC
  Administered 2023-06-15: 10 mL

## 2023-06-15 MED ORDER — ALBUMIN HUMAN 25 % IV SOLN
25.0000 g | Freq: Once | INTRAVENOUS | Status: AC
  Administered 2023-06-15: 25 g via INTRAVENOUS

## 2023-06-15 NOTE — Progress Notes (Signed)
Patient tolerated right sided paracentesis procedure and 25 G of IV albumin well today and 4.7 Liters of yellow ascites removed with labs collected and sent for processing. Patient verbalized understanding of discharge instructions and ambulatory at departure with no acute distress noted.

## 2023-06-16 LAB — PATHOLOGIST SMEAR REVIEW

## 2023-06-17 ENCOUNTER — Encounter (HOSPITAL_COMMUNITY): Payer: Self-pay

## 2023-06-17 ENCOUNTER — Emergency Department (HOSPITAL_COMMUNITY)
Admission: EM | Admit: 2023-06-17 | Discharge: 2023-06-18 | Disposition: A | Discharge status: Home / Self Care | Payer: 59 | Attending: Emergency Medicine | Admitting: Emergency Medicine

## 2023-06-17 ENCOUNTER — Other Ambulatory Visit: Payer: Self-pay

## 2023-06-17 ENCOUNTER — Encounter (HOSPITAL_COMMUNITY): Payer: Self-pay | Admitting: Nurse Practitioner

## 2023-06-17 DIAGNOSIS — R799 Abnormal finding of blood chemistry, unspecified: Secondary | ICD-10-CM | POA: Diagnosis not present

## 2023-06-17 DIAGNOSIS — R899 Unspecified abnormal finding in specimens from other organs, systems and tissues: Secondary | ICD-10-CM

## 2023-06-17 DIAGNOSIS — R7989 Other specified abnormal findings of blood chemistry: Secondary | ICD-10-CM | POA: Insufficient documentation

## 2023-06-17 LAB — CBC WITH DIFFERENTIAL/PLATELET
Abs Immature Granulocytes: 0.05 10*3/uL (ref 0.00–0.07)
Basophils Absolute: 0.1 10*3/uL (ref 0.0–0.1)
Basophils Relative: 1 %
Eosinophils Absolute: 0.4 10*3/uL (ref 0.0–0.5)
Eosinophils Relative: 5 %
HCT: 36.3 % (ref 36.0–46.0)
Hemoglobin: 12.1 g/dL (ref 12.0–15.0)
Immature Granulocytes: 1 %
Lymphocytes Relative: 15 %
Lymphs Abs: 1.1 10*3/uL (ref 0.7–4.0)
MCH: 29.1 pg (ref 26.0–34.0)
MCHC: 33.3 g/dL (ref 30.0–36.0)
MCV: 87.3 fL (ref 80.0–100.0)
Monocytes Absolute: 0.8 10*3/uL (ref 0.1–1.0)
Monocytes Relative: 11 %
Neutro Abs: 5.1 10*3/uL (ref 1.7–7.7)
Neutrophils Relative %: 67 %
Platelets: 59 10*3/uL — ABNORMAL LOW (ref 150–400)
RBC: 4.16 MIL/uL (ref 3.87–5.11)
RDW: 17 % — ABNORMAL HIGH (ref 11.5–15.5)
WBC: 7.6 10*3/uL (ref 4.0–10.5)
nRBC: 0 % (ref 0.0–0.2)

## 2023-06-17 LAB — COMPREHENSIVE METABOLIC PANEL
ALT: 28 U/L (ref 0–44)
AST: 50 U/L — ABNORMAL HIGH (ref 15–41)
Albumin: 3.6 g/dL (ref 3.5–5.0)
Alkaline Phosphatase: 79 U/L (ref 38–126)
Anion gap: 7 (ref 5–15)
BUN: 27 mg/dL — ABNORMAL HIGH (ref 8–23)
CO2: 21 mmol/L — ABNORMAL LOW (ref 22–32)
Calcium: 9.1 mg/dL (ref 8.9–10.3)
Chloride: 106 mmol/L (ref 98–111)
Creatinine, Ser: 0.85 mg/dL (ref 0.44–1.00)
GFR, Estimated: >60 mL/min (ref >60)
Glucose, Bld: 129 mg/dL — ABNORMAL HIGH (ref 70–99)
Potassium: 4 mmol/L (ref 3.5–5.1)
Sodium: 134 mmol/L — ABNORMAL LOW (ref 135–145)
Total Bilirubin: 2.6 mg/dL — ABNORMAL HIGH (ref <1.2)
Total Protein: 5.9 g/dL — ABNORMAL LOW (ref 6.5–8.1)

## 2023-06-17 LAB — LACTIC ACID, PLASMA: Lactic Acid, Venous: 1.9 mmol/L (ref 0.5–1.9)

## 2023-06-17 MED ORDER — SODIUM CHLORIDE 0.9 % IV SOLN
1.0000 g | Freq: Once | INTRAVENOUS | Status: DC
  Filled 2023-06-17: qty 10

## 2023-06-17 NOTE — ED Provider Notes (Signed)
[place] EMERGENCY DEPARTMENT AT [facility]  Provider Note  CSN: [id number] Arrival date & time: 06/17/23 1955  History Chief Complaint  Patient presents with   infection    Karla Young is a 62 y.o. female with history of EtOH cirrhosis, currently on transplant list, had a regularly scheduled paracentesis 2 days ago. She was called today and told that there was concern for SBP. She has not any significant change in her usual abdominal symptoms, no fevers. She had a positive gram stain, but no growth to date on culture and WBC only 79 on cell count.    Home Medications Prior to Admission medications   Medication Sig Start Date End Date Taking? Authorizing Provider  albuterol (PROVENTIL) (2.5 MG/3ML) 0.083% nebulizer solution Take 3 mLs (2.5 mg total) by nebulization every 2 (two) hours as needed for wheezing. Patient not taking: Reported on 02/11/2023 07/23/22   Shon Hale, MD  ceFEPIme 2 g in sodium chloride 0.9 % 100 mL Inject 2 g into the vein daily. 02/22/23   Shon Hale, MD  Darbepoetin Alfa (ARANESP) 200 MCG/0.4ML SOSY injection Inject 0.4 mLs (200 mcg total) into the skin every Monday at 6 PM. 02/28/23   Shon Hale, MD  folic acid (FOLVITE) 1 MG tablet Take 1 tablet (1 mg total) by mouth daily. 07/24/22   Shon Hale, MD  furosemide (LASIX) 40 MG tablet Take 0.5 tablets (20 mg total) by mouth daily. Patient taking differently: Take 20-40 mg by mouth daily. 09/02/22   Dolores Frame, MD  hydrOXYzine (ATARAX) 25 MG tablet Take 1 tablet (25 mg total) by mouth every 8 (eight) hours as needed for anxiety or nausea. 07/23/22   Shon Hale, MD  lactulose (CHRONULAC) 10 GM/15ML SOLN enema Place 300 mLs rectally 3 (three) times daily. 02/22/23   Shon Hale, MD  lactulose (CHRONULAC) 10 GM/15ML solution Take 15 mLs (10 g total) by mouth 3 (three) times daily. Goal is to have 2 to 3 Mushy/Loose stools per Day 11/18/22   Tiffany Kocher, PA-C   levothyroxine (SYNTHROID) 88 MCG tablet Take 1 tablet (88 mcg total) by mouth daily before breakfast. 07/23/22   Emokpae, Courage, MD  midodrine (PROAMATINE) 5 MG tablet Take 5 mg by mouth daily as needed (for low BP).    [provider]  Multiple Vitamins-Minerals (MULTIVITAMIN WITH MINERALS) tablet Take 1 tablet by mouth daily. 07/23/22 07/23/23  Shon Hale, MD  octreotide (SANDOSTATIN) 100 MCG/ML SOLN injection Inject 1 mL (100 mcg total) into the skin every 8 (eight) hours. 02/22/23   Shon Hale, MD  ondansetron (ZOFRAN) 4 MG tablet Take 1 tablet (4 mg total) by mouth every 6 (six) hours as needed for nausea. 08/30/22   Gelene Mink, NP  pantoprazole (PROTONIX) 40 MG injection Inject 40 mg into the vein every 12 (twelve) hours. 02/22/23   Shon Hale, MD  pantoprazole (PROTONIX) 40 MG tablet Take 1 tablet (40 mg total) by mouth 2 (two) times daily. 09/13/22   Gelene Mink, NP  spironolactone (ALDACTONE) 100 MG tablet Take 0.5 tablets (50 mg total) by mouth daily. Patient taking differently: Take 50-100 mg by mouth daily. 09/02/22   Dolores Frame, MD  thiamine (VITAMIN B-1) 100 MG tablet Take 1 tablet (100 mg total) by mouth daily. 07/24/22   Shon Hale, MD  XIFAXAN 550 MG TABS tablet Take 550 mg by mouth 2 (two) times daily.    [provider]     Allergies  Patient has no known allergies.   Review of Systems   Review of Systems Please see HPI for pertinent positives and negatives  Physical Exam BP 118/64   Pulse 84   Temp 98.8 F (37.1 C) (Oral)   Resp 17   Ht 5\' 3"  (1.6 m)   Wt 70 kg   SpO2 100%   BMI 27.35 kg/m   Physical Exam Vitals and nursing note reviewed.  Constitutional:      Appearance: Normal appearance.  HENT:     Head: Normocephalic and atraumatic.     Nose: Nose normal.     Mouth/Throat:     Mouth: Mucous membranes are moist.  Eyes:     Extraocular Movements: Extraocular movements intact.      Conjunctiva/sclera: Conjunctivae normal.  Cardiovascular:     Rate and Rhythm: Normal rate.  Pulmonary:     Effort: Pulmonary effort is normal.     Breath sounds: Normal breath sounds.  Abdominal:     General: Abdomen is flat.     Palpations: Abdomen is soft.     Tenderness: There is no abdominal tenderness. There is no guarding.     Comments: No peritoneal signs  Musculoskeletal:        General: No swelling. Normal range of motion.     Cervical back: Neck supple.  Skin:    General: Skin is warm and dry.  Neurological:     General: No focal deficit present.     Mental Status: She is alert.  Psychiatric:        Mood and Affect: Mood normal.     ED Results / Procedures / Treatments   EKG None  Procedures Procedures  Medications Ordered in the ED Medications  cefTRIAXone (ROCEPHIN) 1 g in sodium chloride 0.9 % 100 mL IVPB (has no administration in time range)    Initial Impression and Plan  Patient here with positive gram stain on peritoneal fluid done 2 days ago, but no other exam signs of peritonitis and ascites labs otherwise reassuring. Labs done here show CBC without leukocytosis. PLT low at baseline. CMP without concerning findings. Will discuss with on-call GI regarding their recommendations for treating with IV Abx. Patient already on prophylactic Cipro.   ED Course   Clinical Course as of 06/18/23 0001  Fri Jun 17, 2023  2355 Spoke with Dr. Margaretha Glassing, GI at [facility], who does NOT recommend admission or IV antibiotics with the current presentation. Will plan discharge home with strict return precautions.  [CS]    Clinical Course User Index [CS] Pollyann Savoy, MD     MDM Rules/Calculators/A&P Medical Decision Making Problems Addressed: Abnormal laboratory test: undiagnosed new problem with uncertain prognosis  Amount and/or Complexity of Data Reviewed Labs: ordered. Decision-making details documented in ED Course.  Risk Decision regarding  hospitalization.     Final Clinical Impression(s) / ED Diagnoses Final diagnoses:  Abnormal laboratory test    Rx / DC Orders ED Discharge Orders     None        Pollyann Savoy, MD 06/18/23 0001

## 2023-06-17 NOTE — ED Triage Notes (Signed)
Pt does paracentesis once a week Called by MD stated that the fluid removed Wednesday had an infection to go to ED  Pt denies any fever or pain

## 2023-06-20 ENCOUNTER — Other Ambulatory Visit (HOSPITAL_COMMUNITY): Payer: Self-pay | Admitting: Nurse Practitioner

## 2023-06-20 DIAGNOSIS — R188 Other ascites: Secondary | ICD-10-CM

## 2023-06-20 LAB — CULTURE, BODY FLUID W GRAM STAIN -BOTTLE: Culture: NO GROWTH

## 2023-06-22 ENCOUNTER — Other Ambulatory Visit: Payer: Self-pay | Admitting: Physician Assistant

## 2023-06-22 ENCOUNTER — Ambulatory Visit (HOSPITAL_COMMUNITY)
Admission: RE | Admit: 2023-06-22 | Discharge: 2023-06-22 | Disposition: A | Discharge status: Home / Self Care | Payer: 59 | Source: Ambulatory Visit | Attending: Nurse Practitioner | Admitting: Nurse Practitioner

## 2023-06-22 ENCOUNTER — Encounter (HOSPITAL_COMMUNITY): Payer: Self-pay

## 2023-06-22 DIAGNOSIS — R188 Other ascites: Secondary | ICD-10-CM | POA: Diagnosis present

## 2023-06-22 DIAGNOSIS — K7031 Alcoholic cirrhosis of liver with ascites: Secondary | ICD-10-CM | POA: Diagnosis not present

## 2023-06-22 LAB — BODY FLUID CELL COUNT WITH DIFFERENTIAL
Eos, Fluid: 0 %
Lymphs, Fluid: 49 %
Monocyte-Macrophage-Serous Fluid: 50 % (ref 50–90)
Neutrophil Count, Fluid: 1 % (ref 0–25)
Total Nucleated Cell Count, Fluid: 59 uL (ref 0–1000)

## 2023-06-22 LAB — GRAM STAIN

## 2023-06-22 MED ORDER — LIDOCAINE HCL (PF) 2 % IJ SOLN
10.0000 mL | Freq: Once | INTRAMUSCULAR | Status: AC
  Administered 2023-06-22: 10 mL

## 2023-06-22 MED ORDER — ALBUMIN HUMAN 25 % IV SOLN
25.0000 g | Freq: Once | INTRAVENOUS | Status: AC
  Administered 2023-06-22: 25 g via INTRAVENOUS

## 2023-06-22 MED ORDER — ALBUMIN HUMAN 25 % IV SOLN
INTRAVENOUS | Status: AC
  Filled 2023-06-22: qty 100

## 2023-06-22 NOTE — Procedures (Addendum)
PROCEDURE SUMMARY:  Successful ultrasound guided paracentesis from the right lower quadrant.  Yielded 3.9 L of clear yellow fluid.  No immediate complications.  The patient tolerated the procedure well.   Specimen was sent for labs.  EBL < 5mL  Patient previously evaluated for TIPS creation by Dr. Elby Showers and deemed a poor candidate on 09/22/22:  Karla Young is a 62 y.o. 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  She is a poor candidate for TIPS creation due to high MELD (specifically hyperbilirubinemia), and has no current indication for such.  If her hepatic function improves and ascites becomes refractory, she may be re-assessed for TIPS candidacy.   Per patient she is currently on liver transplant list.  Discussed above with Dr. Milford Cage today who recommends repeating MELD labs to see if patient may now be a TIPS candidate. Will order CMP, INR and CBC to be drawn at next paracentesis visit.  [name], PA-C

## 2023-06-22 NOTE — Progress Notes (Signed)
Patient tolerated right sided Paracentesis and 25G of IV albumin well today and 3.9 Liters of clear yellow ascites removed with labs collected and sent for processing. Patient verbalized understanding of discharge instructions and ambulatory at discharge with no acute distress noted.

## 2023-06-27 DIAGNOSIS — Z01818 Encounter for other preprocedural examination: Secondary | ICD-10-CM | POA: Diagnosis not present

## 2023-06-27 LAB — CULTURE, BODY FLUID W GRAM STAIN -BOTTLE: Culture: NO GROWTH

## 2023-06-29 ENCOUNTER — Encounter (HOSPITAL_COMMUNITY): Payer: Self-pay

## 2023-06-29 ENCOUNTER — Ambulatory Visit (HOSPITAL_COMMUNITY)
Admission: RE | Admit: 2023-06-29 | Discharge: 2023-06-29 | Disposition: A | Discharge status: Home / Self Care | Payer: 59 | Source: Ambulatory Visit | Attending: Nurse Practitioner | Admitting: Nurse Practitioner

## 2023-06-29 DIAGNOSIS — K7460 Unspecified cirrhosis of liver: Secondary | ICD-10-CM | POA: Diagnosis not present

## 2023-06-29 DIAGNOSIS — R188 Other ascites: Secondary | ICD-10-CM | POA: Diagnosis not present

## 2023-06-29 LAB — BODY FLUID CELL COUNT WITH DIFFERENTIAL
Eos, Fluid: 0 %
Lymphs, Fluid: 66 %
Monocyte-Macrophage-Serous Fluid: 31 % — ABNORMAL LOW (ref 50–90)
Neutrophil Count, Fluid: 3 % (ref 0–25)
Total Nucleated Cell Count, Fluid: 51 uL (ref 0–1000)

## 2023-06-29 LAB — GRAM STAIN: Gram Stain: NONE SEEN

## 2023-06-29 MED ORDER — LIDOCAINE HCL (PF) 2 % IJ SOLN
INTRAMUSCULAR | Status: AC
Start: 2023-06-29 — End: ?
  Filled 2023-06-29: qty 10

## 2023-06-29 NOTE — Progress Notes (Signed)
Pt brought to [place] Rm 1 , ambulatory. Procedure explained, consent obtained. Prepped and draped in sterile manner. Local anesthetic admin with no adverse reaction. Access obtained at RLQ, clear serous fluid retrieved. 4500cc total retrieved. Access withdrawn without complication. Pressure held to stop drainage, dermabond and bandage placed at site. Escorted to lobby  by staff in no obvious distress.

## 2023-07-04 LAB — CULTURE, BODY FLUID W GRAM STAIN -BOTTLE: Culture: NO GROWTH

## 2023-07-06 ENCOUNTER — Ambulatory Visit (HOSPITAL_COMMUNITY)
Admission: RE | Admit: 2023-07-06 | Discharge: 2023-07-06 | Disposition: A | Discharge status: Home / Self Care | Payer: 59 | Source: Ambulatory Visit | Attending: Nurse Practitioner | Admitting: Nurse Practitioner

## 2023-07-06 ENCOUNTER — Encounter (HOSPITAL_COMMUNITY): Payer: Self-pay

## 2023-07-06 DIAGNOSIS — K7460 Unspecified cirrhosis of liver: Secondary | ICD-10-CM | POA: Diagnosis not present

## 2023-07-06 DIAGNOSIS — R188 Other ascites: Secondary | ICD-10-CM | POA: Diagnosis not present

## 2023-07-06 LAB — BODY FLUID CELL COUNT WITH DIFFERENTIAL
Eos, Fluid: 0 %
Lymphs, Fluid: 25 %
Monocyte-Macrophage-Serous Fluid: 73 % (ref 50–90)
Neutrophil Count, Fluid: 2 % (ref 0–25)
Total Nucleated Cell Count, Fluid: 58 uL (ref 0–1000)

## 2023-07-06 LAB — GRAM STAIN: Gram Stain: NONE SEEN

## 2023-07-06 MED ORDER — ALBUMIN HUMAN 25 % IV SOLN
INTRAVENOUS | Status: AC
Start: 2023-07-06 — End: ?
  Filled 2023-07-06: qty 50

## 2023-07-06 MED ORDER — ALBUMIN HUMAN 25 % IV SOLN
INTRAVENOUS | Status: AC
  Filled 2023-07-06: qty 100

## 2023-07-06 MED ORDER — LIDOCAINE HCL (PF) 2 % IJ SOLN
10.0000 mL | Freq: Once | INTRAMUSCULAR | Status: AC
  Administered 2023-07-06: 10 mL

## 2023-07-06 MED ORDER — ALBUMIN HUMAN 25 % IV SOLN
37.5000 g | Freq: Once | INTRAVENOUS | Status: AC
  Administered 2023-07-06: 37.5 g via INTRAVENOUS

## 2023-07-06 MED ORDER — LIDOCAINE HCL (PF) 2 % IJ SOLN
INTRAMUSCULAR | Status: AC
  Filled 2023-07-06: qty 10

## 2023-07-06 MED ORDER — ALBUMIN HUMAN 25 % IV SOLN: 25.0000 g | Freq: Once | INTRAVENOUS | Status: DC

## 2023-07-06 NOTE — Progress Notes (Signed)
Patient tolerated right sided paracentesis and 37.5G of IV albumin well today and 6.8 liters of clear yellow ascites removed with labs collected and sent for processing. Patient verbalized understanding of discharge instructions and ambulatory at departure with no acute distress noted.

## 2023-07-11 LAB — CULTURE, BODY FLUID W GRAM STAIN -BOTTLE: Culture: NO GROWTH

## 2023-07-13 ENCOUNTER — Ambulatory Visit (HOSPITAL_COMMUNITY)
Admission: RE | Admit: 2023-07-13 | Discharge: 2023-07-13 | Disposition: A | Discharge status: Home / Self Care | Payer: 59 | Source: Ambulatory Visit | Attending: Nurse Practitioner | Admitting: Nurse Practitioner

## 2023-07-13 ENCOUNTER — Encounter (HOSPITAL_COMMUNITY): Payer: Self-pay

## 2023-07-13 DIAGNOSIS — R188 Other ascites: Secondary | ICD-10-CM | POA: Diagnosis not present

## 2023-07-13 LAB — BODY FLUID CELL COUNT WITH DIFFERENTIAL
Eos, Fluid: 0 %
Lymphs, Fluid: 37 %
Monocyte-Macrophage-Serous Fluid: 61 % (ref 50–90)
Neutrophil Count, Fluid: 2 % (ref 0–25)
Total Nucleated Cell Count, Fluid: 59 uL (ref 0–1000)

## 2023-07-13 LAB — GRAM STAIN: Gram Stain: NONE SEEN

## 2023-07-13 MED ORDER — LIDOCAINE HCL (PF) 2 % IJ SOLN
10.0000 mL | Freq: Once | INTRAMUSCULAR | Status: AC
  Administered 2023-07-13: 10 mL

## 2023-07-13 MED ORDER — LIDOCAINE HCL (PF) 2 % IJ SOLN
INTRAMUSCULAR | Status: AC
  Filled 2023-07-13: qty 10

## 2023-07-13 MED ORDER — ALBUMIN HUMAN 25 % IV SOLN
37.5000 g | Freq: Once | INTRAVENOUS | Status: AC
  Administered 2023-07-13: 37.5 g via INTRAVENOUS

## 2023-07-13 MED ORDER — ALBUMIN HUMAN 25 % IV SOLN
INTRAVENOUS | Status: AC
  Filled 2023-07-13: qty 150

## 2023-07-13 NOTE — Progress Notes (Signed)
Patient tolerated right sided paracentesis procedure and 37.5G of IV albumin well today and 5 Liters of cloudy yellow ascites removed with labs collected and sent for processing. Patient verbalized understanding of discharge instructions and ambulatory at departure with no acute distress noted.

## 2023-07-13 NOTE — Procedures (Signed)
PROCEDURE SUMMARY:  Successful ultrasound guided paracentesis from the right lower quadrant.  Yielded 5.0 L of clear yellow fluid.  No immediate complications.  The patient tolerated the procedure well.   Specimen was sent for labs.  EBL < 5mL  Patient previously evaluated for TIPS creation by Dr. Elby Showers and deemed a poor candidate on 09/22/22:   Karla Young is a 62 y.o. 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  She is a poor candidate for TIPS creation due to high MELD (specifically hyperbilirubinemia), and has no current indication for such.  If her hepatic function improves and ascites becomes refractory, she may be re-assessed for TIPS candidacy.    Per patient she is currently on liver transplant list.   Discussed above with Dr. Milford Cage 06/22/23 who recommends repeating MELD labs to see if patient may now be a TIPS candidate. CMP, INR and CBC ordered but not yet obtained.    [name], PA-C

## 2023-07-18 LAB — CULTURE, BODY FLUID W GRAM STAIN -BOTTLE
Culture: NO GROWTH
Special Requests: ADEQUATE

## 2023-07-20 ENCOUNTER — Encounter (HOSPITAL_COMMUNITY): Payer: Self-pay

## 2023-07-20 ENCOUNTER — Ambulatory Visit (HOSPITAL_COMMUNITY)
Admission: RE | Admit: 2023-07-20 | Discharge: 2023-07-20 | Disposition: A | Discharge status: Home / Self Care | Payer: 59 | Source: Ambulatory Visit | Attending: Nurse Practitioner | Admitting: Nurse Practitioner

## 2023-07-20 DIAGNOSIS — R188 Other ascites: Secondary | ICD-10-CM | POA: Diagnosis present

## 2023-07-20 DIAGNOSIS — K7031 Alcoholic cirrhosis of liver with ascites: Secondary | ICD-10-CM | POA: Insufficient documentation

## 2023-07-20 LAB — GRAM STAIN

## 2023-07-20 LAB — BODY FLUID CELL COUNT WITH DIFFERENTIAL
Eos, Fluid: 0 %
Lymphs, Fluid: 48 %
Monocyte-Macrophage-Serous Fluid: 50 % (ref 50–90)
Neutrophil Count, Fluid: 2 % (ref 0–25)
Total Nucleated Cell Count, Fluid: 60 uL (ref 0–1000)

## 2023-07-20 MED ORDER — ALBUMIN HUMAN 25 % IV SOLN
25.0000 g | Freq: Once | INTRAVENOUS | Status: AC
  Administered 2023-07-20: 25 g via INTRAVENOUS

## 2023-07-20 NOTE — Procedures (Signed)
PROCEDURE SUMMARY:  Successful image-guided paracentesis from the right abdomen.  Yielded 6.5 liters of clear, straw-colored fluid.  No immediate complications.  EBL: zero Patient tolerated well.   Specimen were sent for labs.  Please see imaging section of Epic for full dictation.  Villa Herb PA-C 07/20/2023 12:16 PM

## 2023-07-20 NOTE — Progress Notes (Signed)
Patient tolerated right sided paracentesis and 25G of IV albumin well today and 6.5 Liters of clear yellow ascites removed with labs collected and sent for processing. Patient verbalized understanding of discharge instructions and ambulatory at departure with no acute distress noted.

## 2023-07-25 LAB — CULTURE, BODY FLUID W GRAM STAIN -BOTTLE: Culture: NO GROWTH

## 2023-07-27 ENCOUNTER — Encounter (HOSPITAL_COMMUNITY): Payer: Self-pay

## 2023-07-27 ENCOUNTER — Ambulatory Visit (HOSPITAL_COMMUNITY)
Admission: RE | Admit: 2023-07-27 | Discharge: 2023-07-27 | Disposition: A | Discharge status: Home / Self Care | Payer: 59 | Source: Ambulatory Visit | Attending: Nurse Practitioner | Admitting: Nurse Practitioner

## 2023-07-27 DIAGNOSIS — R188 Other ascites: Secondary | ICD-10-CM | POA: Diagnosis not present

## 2023-07-27 LAB — BODY FLUID CELL COUNT WITH DIFFERENTIAL
Eos, Fluid: 0 %
Lymphs, Fluid: 74 %
Monocyte-Macrophage-Serous Fluid: 23 % — ABNORMAL LOW (ref 50–90)
Neutrophil Count, Fluid: 3 % (ref 0–25)
Total Nucleated Cell Count, Fluid: 68 uL (ref 0–1000)

## 2023-07-27 LAB — GRAM STAIN: Gram Stain: NONE SEEN

## 2023-07-27 MED ORDER — ALBUMIN HUMAN 25 % IV SOLN
INTRAVENOUS | Status: AC
  Filled 2023-07-27: qty 150

## 2023-07-27 MED ORDER — ALBUMIN HUMAN 25 % IV SOLN
37.5000 g | Freq: Once | INTRAVENOUS | Status: AC
  Administered 2023-07-27: 37.5 g via INTRAVENOUS

## 2023-07-27 NOTE — Progress Notes (Signed)
Right  sided paracentesis procedure and 37.5G of Albumin tolerated well today and 6.5 L of clear yellow ascites removed with labs sent for processing. PT verbalized understanding of discharge instructions and ambulatory at departure with no acute distress noted.

## 2023-08-01 ENCOUNTER — Ambulatory Visit (HOSPITAL_COMMUNITY)
Admission: RE | Admit: 2023-08-01 | Discharge: 2023-08-01 | Disposition: A | Discharge status: Home / Self Care | Payer: 59 | Source: Ambulatory Visit | Attending: Nurse Practitioner | Admitting: Nurse Practitioner

## 2023-08-01 ENCOUNTER — Encounter (HOSPITAL_COMMUNITY): Payer: Self-pay

## 2023-08-01 DIAGNOSIS — R188 Other ascites: Secondary | ICD-10-CM | POA: Insufficient documentation

## 2023-08-01 LAB — BODY FLUID CELL COUNT WITH DIFFERENTIAL
Eos, Fluid: 0 %
Lymphs, Fluid: 73 %
Monocyte-Macrophage-Serous Fluid: 23 % — ABNORMAL LOW (ref 50–90)
Neutrophil Count, Fluid: 4 % (ref 0–25)
Total Nucleated Cell Count, Fluid: 84 uL (ref 0–1000)

## 2023-08-01 LAB — GRAM STAIN: Gram Stain: NONE SEEN

## 2023-08-01 LAB — CULTURE, BODY FLUID W GRAM STAIN -BOTTLE
Culture: NO GROWTH
Special Requests: ADEQUATE

## 2023-08-01 MED ORDER — ALBUMIN HUMAN 25 % IV SOLN
25.0000 g | Freq: Once | INTRAVENOUS | Status: AC
  Administered 2023-08-01: 25 g via INTRAVENOUS

## 2023-08-01 MED ORDER — ALBUMIN HUMAN 25 % IV SOLN
INTRAVENOUS | Status: AC
  Filled 2023-08-01: qty 100

## 2023-08-01 NOTE — Progress Notes (Signed)
Patient tolerated right sided paracentesis procedure and 25G of IV albumin well today and 4.3 Liters of clear yellow ascites removed with labs collected and sent for processing. Patient verbalized understanding of discharge instructions and ambulatory at departure with no acute distress noted.

## 2023-08-02 ENCOUNTER — Ambulatory Visit (HOSPITAL_COMMUNITY): Payer: 59

## 2023-08-04 DIAGNOSIS — Z01818 Encounter for other preprocedural examination: Secondary | ICD-10-CM | POA: Diagnosis not present

## 2023-08-04 DIAGNOSIS — K7469 Other cirrhosis of liver: Secondary | ICD-10-CM | POA: Diagnosis not present

## 2023-08-04 LAB — PATHOLOGIST SMEAR REVIEW

## 2023-08-05 ENCOUNTER — Ambulatory Visit (HOSPITAL_COMMUNITY)
Admission: RE | Admit: 2023-08-05 | Discharge: 2023-08-05 | Disposition: A | Discharge status: Home / Self Care | Payer: 59 | Source: Ambulatory Visit | Attending: Nurse Practitioner | Admitting: Nurse Practitioner

## 2023-08-05 ENCOUNTER — Other Ambulatory Visit (HOSPITAL_COMMUNITY): Payer: Self-pay | Admitting: Interventional Radiology

## 2023-08-05 ENCOUNTER — Ambulatory Visit (HOSPITAL_COMMUNITY)
Admission: RE | Admit: 2023-08-05 | Discharge: 2023-08-05 | Disposition: A | Discharge status: Home / Self Care | Payer: 59 | Source: Ambulatory Visit | Attending: Interventional Radiology | Admitting: Interventional Radiology

## 2023-08-05 ENCOUNTER — Other Ambulatory Visit (HOSPITAL_COMMUNITY): Payer: Self-pay | Admitting: Nurse Practitioner

## 2023-08-05 DIAGNOSIS — R188 Other ascites: Secondary | ICD-10-CM

## 2023-08-05 NOTE — Progress Notes (Signed)
Interventional Radiology Progress Note  The patient called to be seen today after experiencing significant persistent leakage of ascites from the catheter exit site from her most recent paracentesis procedure on 08/01/23 for the past 2 days. No associated pain or fever.  [place] today shows only a small volume of ascites in the peritoneal cavity and not enough to warrant repeat decompression today to assist in leakage control.  Inspection of the site shows no erythema, fluctuance or tenderness. Persistent oozing of clear fluid from skin dermatotomy.   Procedure: After prep with chlorhexidine and local anesthesia with 1% Lidocaine, a 3-0 Ethilon pursestring suture was applied around the leaking dermatotomy site with apparent cessation of fluid leakage.  A dressing was applied over the suture site.  Plan: The patient will return for her scheduled paracentesis appointment next Monday to evaluate for paracentesis and inspect the leak site with possible removal of the pursestring suture at that time.   Jodi Marble. Fredia Sorrow, M.D Pager:  [phone]

## 2023-08-06 LAB — CULTURE, BODY FLUID W GRAM STAIN -BOTTLE: Culture: NO GROWTH

## 2023-08-08 ENCOUNTER — Ambulatory Visit (HOSPITAL_COMMUNITY): Admission: RE | Admit: 2023-08-08 | Payer: 59 | Source: Ambulatory Visit

## 2023-08-08 ENCOUNTER — Ambulatory Visit (HOSPITAL_COMMUNITY)
Admission: RE | Admit: 2023-08-08 | Discharge: 2023-08-08 | Disposition: A | Discharge status: Home / Self Care | Payer: 59 | Source: Ambulatory Visit | Attending: Nurse Practitioner | Admitting: Nurse Practitioner

## 2023-08-08 ENCOUNTER — Other Ambulatory Visit (HOSPITAL_COMMUNITY): Payer: Self-pay | Admitting: Nurse Practitioner

## 2023-08-08 ENCOUNTER — Encounter (HOSPITAL_COMMUNITY): Payer: Self-pay

## 2023-08-08 DIAGNOSIS — R188 Other ascites: Secondary | ICD-10-CM

## 2023-08-08 LAB — GRAM STAIN: Gram Stain: NONE SEEN

## 2023-08-08 LAB — BODY FLUID CELL COUNT WITH DIFFERENTIAL
Eos, Fluid: 0 %
Lymphs, Fluid: 48 %
Monocyte-Macrophage-Serous Fluid: 49 % — ABNORMAL LOW (ref 50–90)
Neutrophil Count, Fluid: 3 % (ref 0–25)
Total Nucleated Cell Count, Fluid: 80 uL (ref 0–1000)

## 2023-08-08 MED ORDER — ALBUMIN HUMAN 25 % IV SOLN
INTRAVENOUS | Status: AC
  Filled 2023-08-08: qty 100

## 2023-08-08 NOTE — Progress Notes (Signed)
Patient tolerated left sided paracentesis procedure well today and 3 Liters of clear yellow ascites removed with labs collected and sent for processing. Patient verbalized understanding of discharge instructions and ambulatory at departure with no acute distress noted. Suture removed by Dr. Bryn Gulling today from RLQ that was placed on 08/05/23 and dermabond applied and no bleeding or ascites leak noted.

## 2023-08-09 ENCOUNTER — Inpatient Hospital Stay (HOSPITAL_COMMUNITY): Admission: RE | Admit: 2023-08-09 | Payer: 59 | Source: Ambulatory Visit

## 2023-08-10 IMAGING — CT CT ABD-PELV W/ CM
2 of 5 series · 16 of 46 positions shown, 18 images · IV contrast (Omnipaque or Isovue)
Comparison: 11/12/2005

CLINICAL DATA: Abdominal pain and distension for 1 month, diarrhea,
right lower quadrant abdominal pain, history of irritable bowel
syndrome

EXAM:
CT ABDOMEN AND PELVIS WITH CONTRAST
TECHNIQUE: Multidetector CT imaging of the abdomen and pelvis was performed
using the standard protocol following bolus administration of
intravenous contrast.
CONTRAST:  100mL OMNIPAQUE IOHEXOL 300 MG/ML  SOLN

[Series 2: axial st · axial · 0.90mm/px · z∈[+632,+1092]mm · 13 of 104 slices shown, 15 images]
[im 6/104  soft-tissue]
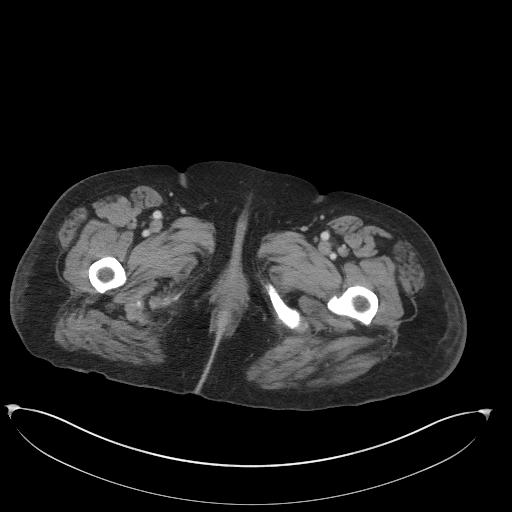
[im 6/104  bone]
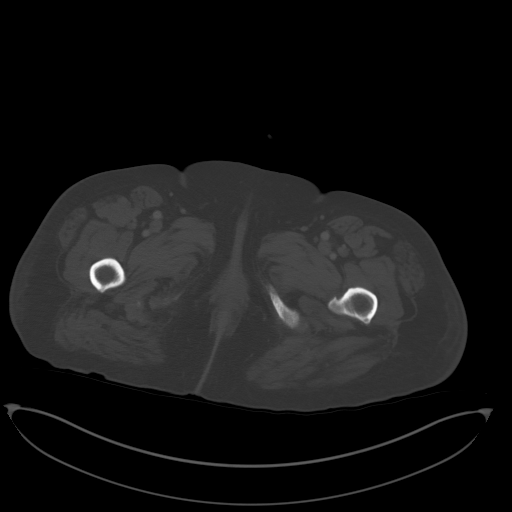
[im 17/104  soft-tissue]
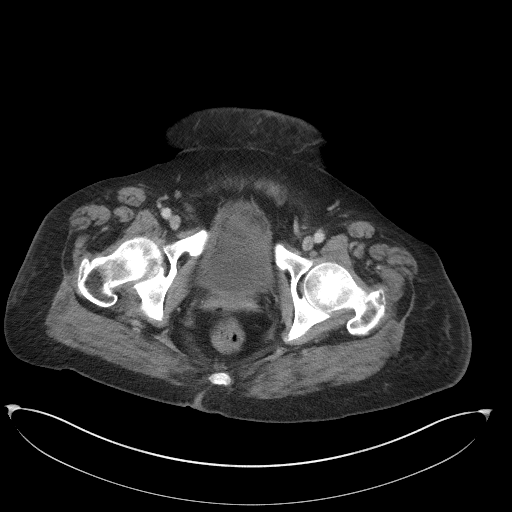
[im 22/104  soft-tissue]
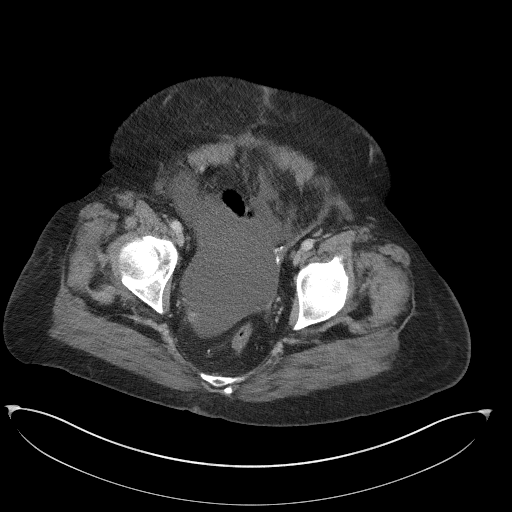
[im 28/104  soft-tissue]
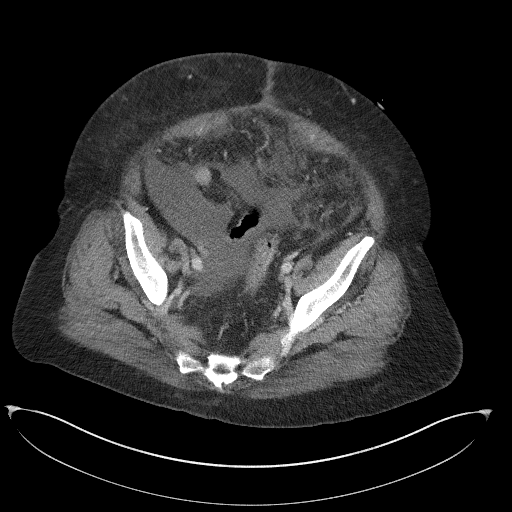
[im 38/104  soft-tissue]
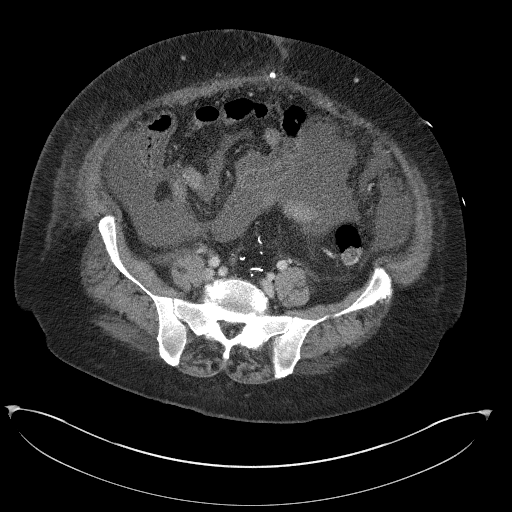
[im 44/104  soft-tissue]
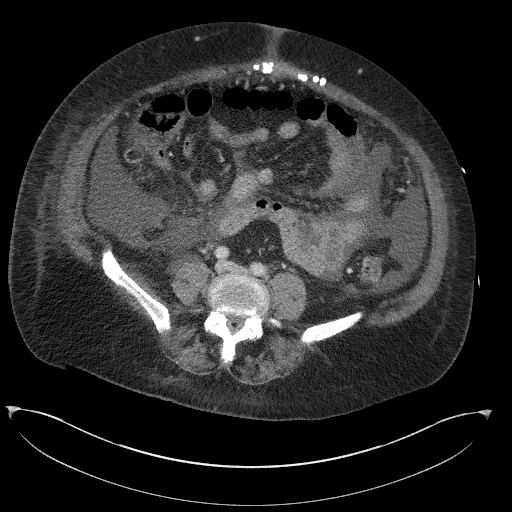
[im 55/104  soft-tissue]
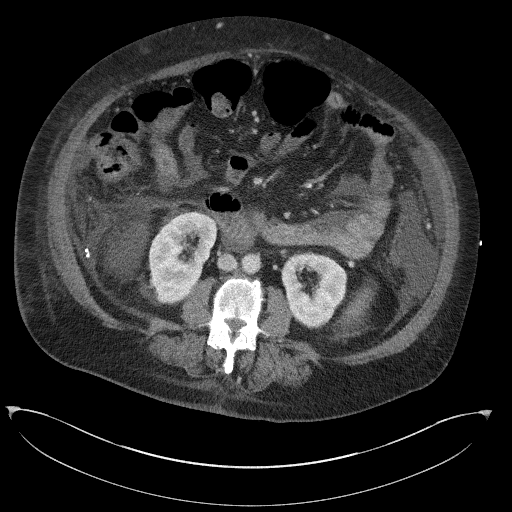
[im 60/104  soft-tissue]
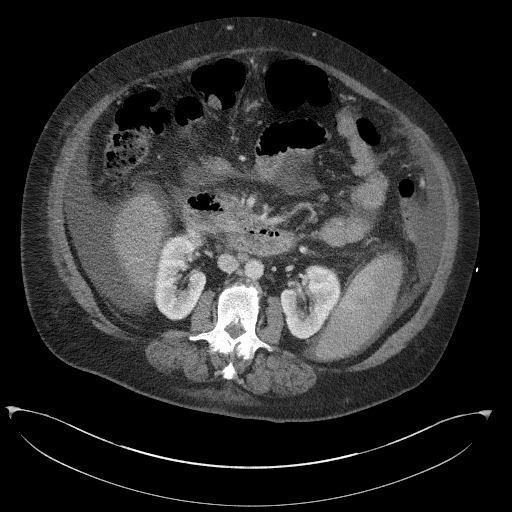
[im 66/104  soft-tissue]
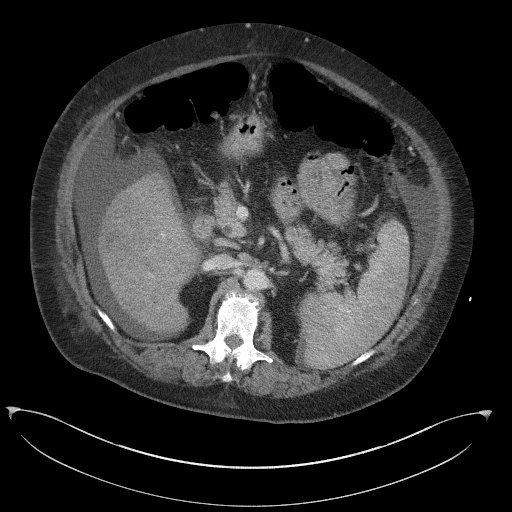
[im 66/104  bone]
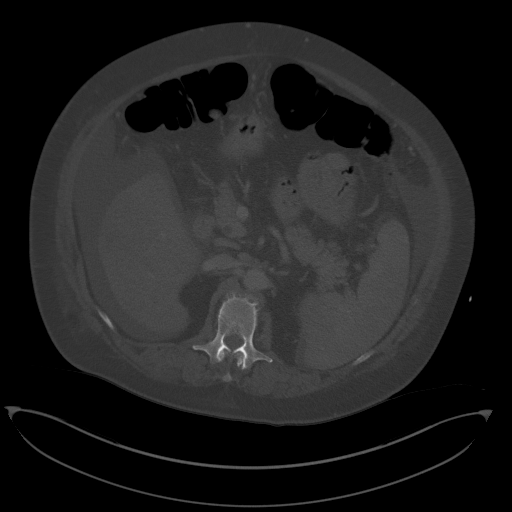
[im 76/104  soft-tissue]
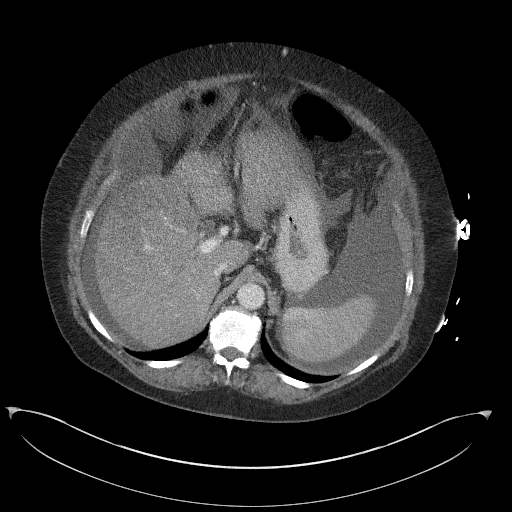
[im 82/104  soft-tissue]
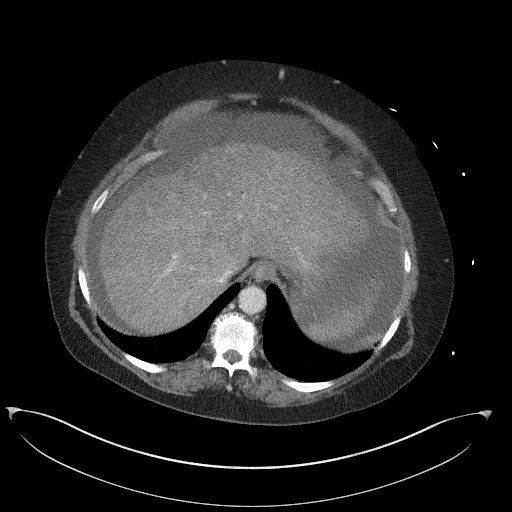
[im 87/104  soft-tissue]
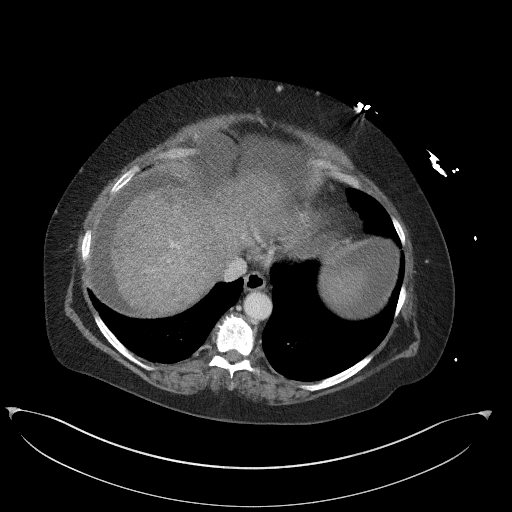
[im 98/104  soft-tissue]
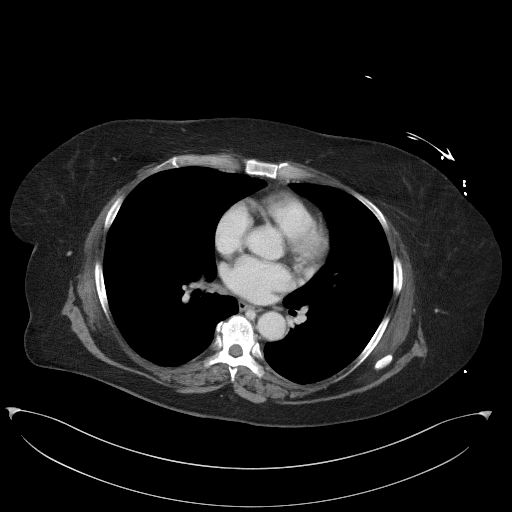

[Series 5: coronal st · coronal · 0.90mm/px · 3 of 137 slices shown]
[im 46/137  soft-tissue]
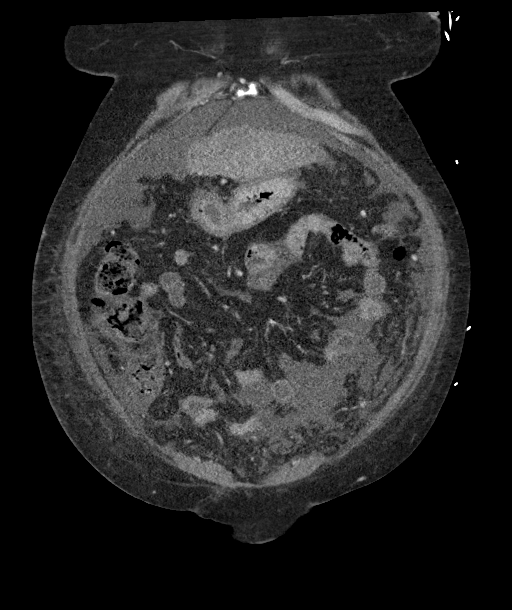
[im 61/137  soft-tissue]
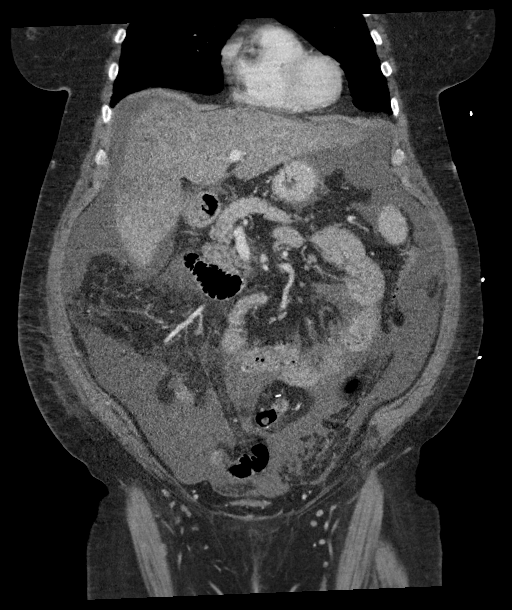
[im 76/137  soft-tissue]
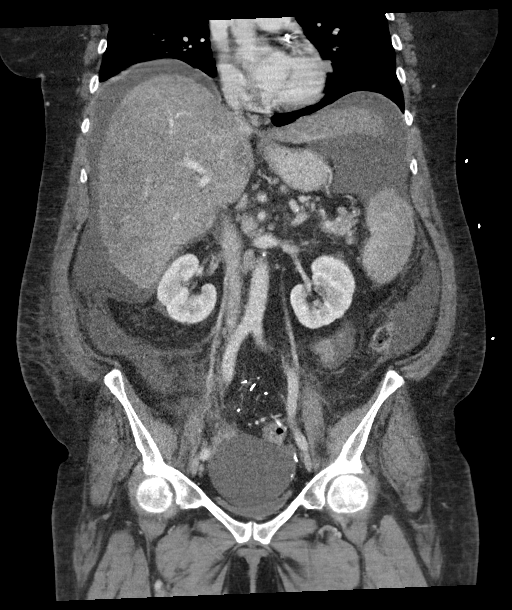

[16 of 46 positions shown; findings below may reference images not displayed]

FINDINGS: Lower chest: No acute pleural or parenchymal lung disease.

Hepatobiliary: Shrunken nodular appearance of the liver consistent
with cirrhosis. There is heterogeneous hypoattenuation throughout
the liver parenchyma, which could reflect steatosis. No discrete
liver mass on this single phase evaluation. If underlying liver
neoplasm is suspected or clinically important to document,
nonemergent follow-up outpatient dedicated liver MRI could be
performed.

No intrahepatic biliary duct dilation. The gallbladder surgically
absent.

Pancreas: Unremarkable. No pancreatic ductal dilatation or
surrounding inflammatory changes.

Spleen: Normal in size without focal abnormality.

Adrenals/Urinary Tract: Kidneys enhance normally and symmetrically.
No urinary tract calculi or obstruction. The adrenals are
unremarkable. Bladder is decompressed, limiting its evaluation.

Stomach/Bowel: No bowel obstruction or ileus. Postsurgical changes
from prior sigmoid colon resection and reanastomosis. Normal
retrocecal appendix. No bowel wall thickening or inflammatory
change.

Vascular/Lymphatic: No significant vascular findings are present. No
enlarged abdominal or pelvic lymph nodes.

Reproductive: Status post hysterectomy. No adnexal masses.

Other: Moderate ascites most pronounced in the upper abdomen. No
free intraperitoneal gas. Postsurgical changes from prior umbilical
hernia repair.

Musculoskeletal: No acute or destructive bony lesions. Reconstructed
images demonstrate no additional findings.
IMPRESSION: 1. Cirrhosis, with heterogeneous hypodensities throughout the liver
which could reflect steatosis. Evaluation for liver neoplasm is
limited on this single phase exam, and if further evaluation is
desired a nonemergent outpatient dedicated liver MRI could be
performed.
2. Moderate ascites.
3.  Aortic Atherosclerosis ([id number]).

## 2023-08-13 LAB — CULTURE, BODY FLUID W GRAM STAIN -BOTTLE: Culture: NO GROWTH

## 2023-08-17 ENCOUNTER — Ambulatory Visit (HOSPITAL_COMMUNITY): Payer: 59

## 2023-08-17 ENCOUNTER — Ambulatory Visit (HOSPITAL_COMMUNITY)
Admission: RE | Admit: 2023-08-17 | Discharge: 2023-08-17 | Disposition: A | Discharge status: Home / Self Care | Payer: 59 | Source: Ambulatory Visit | Attending: Nurse Practitioner | Admitting: Nurse Practitioner

## 2023-08-17 ENCOUNTER — Other Ambulatory Visit: Payer: Self-pay | Admitting: Internal Medicine

## 2023-08-17 ENCOUNTER — Encounter (HOSPITAL_COMMUNITY): Payer: Self-pay

## 2023-08-17 DIAGNOSIS — R188 Other ascites: Secondary | ICD-10-CM | POA: Insufficient documentation

## 2023-08-17 DIAGNOSIS — K7031 Alcoholic cirrhosis of liver with ascites: Secondary | ICD-10-CM | POA: Diagnosis not present

## 2023-08-17 LAB — GRAM STAIN: Gram Stain: NONE SEEN

## 2023-08-17 LAB — BODY FLUID CELL COUNT WITH DIFFERENTIAL
Eos, Fluid: 0 %
Lymphs, Fluid: 28 %
Monocyte-Macrophage-Serous Fluid: 71 % (ref 50–90)
Neutrophil Count, Fluid: 1 % (ref 0–25)
Total Nucleated Cell Count, Fluid: 51 uL (ref 0–1000)

## 2023-08-17 MED ORDER — ALBUMIN HUMAN 25 % IV SOLN
INTRAVENOUS | Status: AC
  Filled 2023-08-17: qty 150

## 2023-08-17 MED ORDER — ALBUMIN HUMAN 25 % IV SOLN
37.5000 g | Freq: Once | INTRAVENOUS | Status: AC
  Administered 2023-08-17: 37.5 g via INTRAVENOUS

## 2023-08-17 NOTE — Procedures (Signed)
 PROCEDURE SUMMARY:  Successful image-guided paracentesis from the right lower abdomen.  Yielded 6.0 liters of clear yellow fluid.  No immediate complications.  EBL < 1 mL Patient tolerated well.   Specimen was sent for labs.  Please see imaging section of Epic for full dictation.  Patient previously evaluated for TIPS creation by Dr. Jennefer and deemed a poor candidate on 09/22/22:   Karla Young is a 63 y.o. 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  She is a poor candidate for TIPS creation due to high MELD (specifically hyperbilirubinemia), and has no current indication for such.  If her hepatic function improves and ascites becomes refractory, she may be re-assessed for TIPS candidacy.    Per patient she is currently on liver transplant list.  Karla DELENA Hesselbach PA-C 08/17/2023 9:45 AM

## 2023-08-17 NOTE — Progress Notes (Signed)
 Patient tolerated right sided paracentesis and 37.5 G of IV albumin  well today and 6 Liters of clear yellow ascites removed with labs collected and sent for processing. Patient verbalized understanding of discharge instructions and ambulatory at departure with no acute distress noted.

## 2023-08-21 DIAGNOSIS — Z944 Liver transplant status: Secondary | ICD-10-CM | POA: Diagnosis not present

## 2023-08-21 DIAGNOSIS — I493 Ventricular premature depolarization: Secondary | ICD-10-CM | POA: Diagnosis not present

## 2023-08-22 LAB — CULTURE, BODY FLUID W GRAM STAIN -BOTTLE: Culture: NO GROWTH

## 2023-08-24 ENCOUNTER — Encounter (HOSPITAL_COMMUNITY): Payer: Self-pay

## 2023-08-24 ENCOUNTER — Other Ambulatory Visit: Payer: Self-pay

## 2023-08-24 ENCOUNTER — Ambulatory Visit (HOSPITAL_COMMUNITY)
Admission: RE | Admit: 2023-08-24 | Discharge: 2023-08-24 | Disposition: A | Discharge status: Home / Self Care | Payer: 59 | Source: Ambulatory Visit | Attending: Nurse Practitioner | Admitting: Nurse Practitioner

## 2023-08-24 DIAGNOSIS — K219 Gastro-esophageal reflux disease without esophagitis: Secondary | ICD-10-CM

## 2023-08-24 DIAGNOSIS — R188 Other ascites: Secondary | ICD-10-CM

## 2023-08-24 DIAGNOSIS — I8510 Secondary esophageal varices without bleeding: Secondary | ICD-10-CM

## 2023-08-24 DIAGNOSIS — K766 Portal hypertension: Secondary | ICD-10-CM | POA: Insufficient documentation

## 2023-08-24 DIAGNOSIS — K7031 Alcoholic cirrhosis of liver with ascites: Secondary | ICD-10-CM | POA: Diagnosis not present

## 2023-08-24 LAB — GRAM STAIN: Gram Stain: NONE SEEN

## 2023-08-24 LAB — BODY FLUID CELL COUNT WITH DIFFERENTIAL
Eos, Fluid: 1 %
Lymphs, Fluid: 32 %
Monocyte-Macrophage-Serous Fluid: 65 % (ref 50–90)
Neutrophil Count, Fluid: 2 % (ref 0–25)
Total Nucleated Cell Count, Fluid: 67 uL (ref 0–1000)

## 2023-08-24 MED ORDER — PANTOPRAZOLE SODIUM 40 MG PO TBEC: 40.0000 mg | DELAYED_RELEASE_TABLET | Freq: Two times a day (BID) | ORAL | 2 refills | Status: DC

## 2023-08-24 MED ORDER — ALBUMIN HUMAN 25 % IV SOLN
37.5000 g | Freq: Once | INTRAVENOUS | Status: AC
  Administered 2023-08-24: 37.5 g via INTRAVENOUS

## 2023-08-24 MED ORDER — ALBUMIN HUMAN 25 % IV SOLN
INTRAVENOUS | Status: AC
Start: 2023-08-24 — End: ?
  Filled 2023-08-24: qty 150

## 2023-08-24 NOTE — Progress Notes (Signed)
 Patient tolerated right sided paracentesis procedure and 37.5G of Albumin  well today and 3.2 Liters of clear yellow ascites removed with labs collected and sent for processing. Patient verbalized understanding of discharge instructions and ambulatory at departure with no acute distress noted.

## 2023-08-24 NOTE — Procedures (Signed)
 PROCEDURE SUMMARY:  Successful image-guided paracentesis from the right lower abdomen.  Yielded 3.2 liters of clear yellow fluid.  No immediate complications.  EBL: zero Patient tolerated well.   Specimen was sent for labs.  Please see imaging section of Epic for full dictation.  Patient previously evaluated for TIPS creation by Dr. Jinx Mourning and deemed a poor candidate on 09/22/22:   Karla Young is a 63 y.o. 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  She is a poor candidate for TIPS creation due to high MELD (specifically hyperbilirubinemia), and has no current indication for such.  If her hepatic function improves and ascites becomes refractory, she may be re-assessed for TIPS candidacy.    Per patient she is currently on liver transplant list.  Karla Shown PA-C 08/24/2023 11:57 AM

## 2023-08-29 ENCOUNTER — Other Ambulatory Visit (HOSPITAL_COMMUNITY): Payer: Self-pay | Admitting: Nurse Practitioner

## 2023-08-29 DIAGNOSIS — R188 Other ascites: Secondary | ICD-10-CM

## 2023-08-29 LAB — CULTURE, BODY FLUID W GRAM STAIN -BOTTLE: Culture: NO GROWTH

## 2023-08-31 ENCOUNTER — Other Ambulatory Visit (HOSPITAL_COMMUNITY): Payer: 59

## 2023-08-31 ENCOUNTER — Encounter (HOSPITAL_COMMUNITY): Payer: Self-pay

## 2023-08-31 ENCOUNTER — Ambulatory Visit (HOSPITAL_COMMUNITY)
Admission: RE | Admit: 2023-08-31 | Discharge: 2023-08-31 | Disposition: A | Discharge status: Home / Self Care | Payer: 59 | Source: Ambulatory Visit | Attending: Nurse Practitioner | Admitting: Nurse Practitioner

## 2023-08-31 DIAGNOSIS — K7460 Unspecified cirrhosis of liver: Secondary | ICD-10-CM | POA: Diagnosis not present

## 2023-08-31 DIAGNOSIS — R188 Other ascites: Secondary | ICD-10-CM | POA: Diagnosis not present

## 2023-08-31 LAB — BODY FLUID CELL COUNT WITH DIFFERENTIAL
Eos, Fluid: 0 %
Lymphs, Fluid: 35 %
Monocyte-Macrophage-Serous Fluid: 64 % (ref 50–90)
Neutrophil Count, Fluid: 1 % (ref 0–25)
Total Nucleated Cell Count, Fluid: 49 uL (ref 0–1000)

## 2023-08-31 LAB — GRAM STAIN: Gram Stain: NONE SEEN

## 2023-08-31 MED ORDER — ALBUMIN HUMAN 25 % IV SOLN
37.5000 g | Freq: Once | INTRAVENOUS | Status: AC
  Administered 2023-08-31: 37.5 g via INTRAVENOUS

## 2023-08-31 MED ORDER — LIDOCAINE HCL (PF) 2 % IJ SOLN
10.0000 mL | Freq: Once | INTRAMUSCULAR | Status: AC
  Administered 2023-08-31: 10 mL

## 2023-08-31 MED ORDER — LIDOCAINE HCL (PF) 2 % IJ SOLN
INTRAMUSCULAR | Status: AC
  Filled 2023-08-31: qty 10

## 2023-08-31 MED ORDER — ALBUMIN HUMAN 25 % IV SOLN
INTRAVENOUS | Status: AC
  Filled 2023-08-31: qty 150

## 2023-08-31 NOTE — Progress Notes (Signed)
Patient tolerated right sided paracentesis procedure and 37.5 G of IV albumin well today and 7.4 Liters of clear yellow ascites removed with labs collected and sent for processing. Patient verbalized understanding of discharge instructions and ambulatory at departure with no acute distress noted.

## 2023-09-05 LAB — CULTURE, BODY FLUID W GRAM STAIN -BOTTLE: Culture: NO GROWTH

## 2023-09-06 ENCOUNTER — Other Ambulatory Visit (HOSPITAL_COMMUNITY): Payer: Self-pay | Admitting: Nurse Practitioner

## 2023-09-06 DIAGNOSIS — E440 Moderate protein-calorie malnutrition: Secondary | ICD-10-CM | POA: Diagnosis not present

## 2023-09-06 DIAGNOSIS — I8510 Secondary esophageal varices without bleeding: Secondary | ICD-10-CM | POA: Diagnosis not present

## 2023-09-06 DIAGNOSIS — R188 Other ascites: Secondary | ICD-10-CM | POA: Diagnosis not present

## 2023-09-06 DIAGNOSIS — K7469 Other cirrhosis of liver: Secondary | ICD-10-CM

## 2023-09-06 DIAGNOSIS — K7682 Hepatic encephalopathy: Secondary | ICD-10-CM

## 2023-09-07 ENCOUNTER — Encounter (HOSPITAL_COMMUNITY): Payer: Self-pay

## 2023-09-07 ENCOUNTER — Ambulatory Visit (HOSPITAL_COMMUNITY)
Admission: RE | Admit: 2023-09-07 | Discharge: 2023-09-07 | Disposition: A | Discharge status: Home / Self Care | Payer: 59 | Source: Ambulatory Visit | Attending: Internal Medicine | Admitting: Internal Medicine

## 2023-09-07 DIAGNOSIS — R188 Other ascites: Secondary | ICD-10-CM | POA: Insufficient documentation

## 2023-09-07 DIAGNOSIS — K7460 Unspecified cirrhosis of liver: Secondary | ICD-10-CM | POA: Diagnosis not present

## 2023-09-07 LAB — GRAM STAIN

## 2023-09-07 LAB — BODY FLUID CELL COUNT WITH DIFFERENTIAL
Eos, Fluid: 0 %
Lymphs, Fluid: 17 %
Monocyte-Macrophage-Serous Fluid: 82 % (ref 50–90)
Neutrophil Count, Fluid: 1 % (ref 0–25)
Total Nucleated Cell Count, Fluid: 68 uL (ref 0–1000)

## 2023-09-07 MED ORDER — LIDOCAINE HCL (PF) 2 % IJ SOLN
10.0000 mL | Freq: Once | INTRAMUSCULAR | Status: AC
  Administered 2023-09-07: 10 mL

## 2023-09-07 MED ORDER — LIDOCAINE HCL (PF) 2 % IJ SOLN
INTRAMUSCULAR | Status: AC
  Filled 2023-09-07: qty 10

## 2023-09-07 MED ORDER — ALBUMIN HUMAN 25 % IV SOLN
37.5000 g | Freq: Once | INTRAVENOUS | Status: AC
  Administered 2023-09-07: 37.5 g via INTRAVENOUS

## 2023-09-07 MED ORDER — ALBUMIN HUMAN 25 % IV SOLN
INTRAVENOUS | Status: AC
  Filled 2023-09-07: qty 150

## 2023-09-07 NOTE — Progress Notes (Signed)
Patient tolerated right sided paracentesis and 37.5G of IV albumin well today and 6 Liters of ascites removed with labs collected and sent for processing. Patient verbalized understanding of discharge instructions and ambulatory at departure with no acute distress noted.

## 2023-09-12 LAB — CULTURE, BODY FLUID W GRAM STAIN -BOTTLE
Culture: NO GROWTH
Special Requests: ADEQUATE

## 2023-09-14 ENCOUNTER — Ambulatory Visit (HOSPITAL_COMMUNITY)
Admission: RE | Admit: 2023-09-14 | Discharge: 2023-09-14 | Disposition: A | Discharge status: Home / Self Care | Payer: 59 | Source: Ambulatory Visit | Attending: Internal Medicine | Admitting: Internal Medicine

## 2023-09-14 ENCOUNTER — Encounter (HOSPITAL_COMMUNITY): Payer: Self-pay

## 2023-09-14 ENCOUNTER — Ambulatory Visit (HOSPITAL_COMMUNITY)
Admission: RE | Admit: 2023-09-14 | Discharge: 2023-09-14 | Disposition: A | Discharge status: Home / Self Care | Payer: 59 | Source: Ambulatory Visit | Attending: Nurse Practitioner | Admitting: Nurse Practitioner

## 2023-09-14 DIAGNOSIS — E440 Moderate protein-calorie malnutrition: Secondary | ICD-10-CM

## 2023-09-14 DIAGNOSIS — K7682 Hepatic encephalopathy: Secondary | ICD-10-CM | POA: Insufficient documentation

## 2023-09-14 DIAGNOSIS — K7469 Other cirrhosis of liver: Secondary | ICD-10-CM

## 2023-09-14 DIAGNOSIS — Z01818 Encounter for other preprocedural examination: Secondary | ICD-10-CM | POA: Diagnosis not present

## 2023-09-14 DIAGNOSIS — K7460 Unspecified cirrhosis of liver: Secondary | ICD-10-CM | POA: Diagnosis not present

## 2023-09-14 DIAGNOSIS — R188 Other ascites: Secondary | ICD-10-CM

## 2023-09-14 DIAGNOSIS — I8510 Secondary esophageal varices without bleeding: Secondary | ICD-10-CM | POA: Insufficient documentation

## 2023-09-14 DIAGNOSIS — Z9049 Acquired absence of other specified parts of digestive tract: Secondary | ICD-10-CM | POA: Diagnosis not present

## 2023-09-14 DIAGNOSIS — K7689 Other specified diseases of liver: Secondary | ICD-10-CM | POA: Diagnosis not present

## 2023-09-14 DIAGNOSIS — R932 Abnormal findings on diagnostic imaging of liver and biliary tract: Secondary | ICD-10-CM | POA: Diagnosis not present

## 2023-09-14 LAB — BODY FLUID CELL COUNT WITH DIFFERENTIAL
Eos, Fluid: 0 %
Lymphs, Fluid: 55 %
Monocyte-Macrophage-Serous Fluid: 41 % — ABNORMAL LOW (ref 50–90)
Neutrophil Count, Fluid: 4 % (ref 0–25)
Total Nucleated Cell Count, Fluid: 90 uL (ref 0–1000)

## 2023-09-14 LAB — GRAM STAIN: Gram Stain: NONE SEEN

## 2023-09-14 MED ORDER — ALBUMIN HUMAN 25 % IV SOLN
37.5000 g | Freq: Once | INTRAVENOUS | Status: AC
  Administered 2023-09-14: 37.5 g via INTRAVENOUS

## 2023-09-14 MED ORDER — LIDOCAINE HCL (PF) 2 % IJ SOLN
10.0000 mL | Freq: Once | INTRAMUSCULAR | Status: AC
  Administered 2023-09-14: 10 mL

## 2023-09-14 MED ORDER — LIDOCAINE HCL (PF) 2 % IJ SOLN
INTRAMUSCULAR | Status: AC
  Filled 2023-09-14: qty 10

## 2023-09-14 MED ORDER — ALBUMIN HUMAN 25 % IV SOLN
INTRAVENOUS | Status: AC
Start: 2023-09-14 — End: ?
  Filled 2023-09-14: qty 150

## 2023-09-14 NOTE — Procedures (Signed)
Interventional Radiology Procedure:   Indications: Recurrent ascites  Procedure: US guided paracentesis  Findings: Removed 5000 ml from right lower quadrant  Complications: None     EBL: Less than 10 ml   Enzio Buchler R. Anselm Pancoast, MD  Pager: [phone]

## 2023-09-14 NOTE — Progress Notes (Signed)
 Patient tolerated right sided paracentesis procedure and 37.5 G of IV albumin  well today and 5 Liters of yellow ascites removed with labs collected and sent for processing. Patient verbalized understanding of discharge instructions and ambulatory at departure with no acute distress noted.

## 2023-09-15 LAB — PATHOLOGIST SMEAR REVIEW

## 2023-09-18 ENCOUNTER — Other Ambulatory Visit (INDEPENDENT_AMBULATORY_CARE_PROVIDER_SITE_OTHER): Payer: Self-pay | Admitting: Gastroenterology

## 2023-09-18 DIAGNOSIS — K7031 Alcoholic cirrhosis of liver with ascites: Secondary | ICD-10-CM

## 2023-09-19 ENCOUNTER — Telehealth: Payer: Self-pay

## 2023-09-19 LAB — CULTURE, BODY FLUID W GRAM STAIN -BOTTLE: Culture: NO GROWTH

## 2023-09-19 NOTE — Telephone Encounter (Signed)
 PA done for Pantoprazole  Sodium 40 mg DR Tab. On Cover My Meds. Dx used: I85.10 and K21.9  Approved: given to Amalia Badder to scan to chart

## 2023-09-19 NOTE — Telephone Encounter (Signed)
 Patient says yes she is following the [facility] and she will have them fill this.

## 2023-09-21 ENCOUNTER — Telehealth: Payer: Self-pay

## 2023-09-21 ENCOUNTER — Encounter (HOSPITAL_COMMUNITY): Payer: Self-pay

## 2023-09-21 ENCOUNTER — Ambulatory Visit (HOSPITAL_COMMUNITY)
Admission: RE | Admit: 2023-09-21 | Discharge: 2023-09-21 | Disposition: A | Discharge status: Home / Self Care | Payer: 59 | Source: Ambulatory Visit | Attending: Nurse Practitioner | Admitting: Nurse Practitioner

## 2023-09-21 DIAGNOSIS — R188 Other ascites: Secondary | ICD-10-CM | POA: Diagnosis not present

## 2023-09-21 DIAGNOSIS — K7460 Unspecified cirrhosis of liver: Secondary | ICD-10-CM | POA: Insufficient documentation

## 2023-09-21 DIAGNOSIS — R140 Abdominal distension (gaseous): Secondary | ICD-10-CM | POA: Insufficient documentation

## 2023-09-21 LAB — BODY FLUID CELL COUNT WITH DIFFERENTIAL
Eos, Fluid: 1 %
Lymphs, Fluid: 58 %
Monocyte-Macrophage-Serous Fluid: 35 % — ABNORMAL LOW (ref 50–90)
Neutrophil Count, Fluid: 6 % (ref 0–25)
Total Nucleated Cell Count, Fluid: 90 uL (ref 0–1000)

## 2023-09-21 LAB — GRAM STAIN

## 2023-09-21 MED ORDER — LIDOCAINE HCL (PF) 2 % IJ SOLN
INTRAMUSCULAR | Status: AC
  Filled 2023-09-21: qty 10

## 2023-09-21 MED ORDER — LIDOCAINE HCL (PF) 2 % IJ SOLN
10.0000 mL | Freq: Once | INTRAMUSCULAR | Status: AC
  Administered 2023-09-21: 10 mL

## 2023-09-21 MED ORDER — ALBUMIN HUMAN 25 % IV SOLN
INTRAVENOUS | Status: AC
  Filled 2023-09-21: qty 150

## 2023-09-21 MED ORDER — ALBUMIN HUMAN 25 % IV SOLN
37.5000 g | Freq: Once | INTRAVENOUS | Status: AC
  Administered 2023-09-21: 37.5 g via INTRAVENOUS

## 2023-09-21 NOTE — Telephone Encounter (Signed)
Returned the pt back and was advised she needed a refill on her Spironolactone. Pt's last seen 09/23/2022. I advised her that she was suppose to have came back in 2 months from this visit. She states she has been back and forth to [facility]. I advised her that I still have to let you know because we have to have updated notes. Please advise

## 2023-09-21 NOTE — Progress Notes (Signed)
Patient tolerated right sided Paracentesis and 37.5 G of IV albumin well today 4.6 Liters of clear yellow ascites removed with labs collected and sent for processing. Patient verbalized understanding of discharge instructions and ambulatory at departure with no acute distress noted.

## 2023-09-22 NOTE — Telephone Encounter (Signed)
Phoned and advised the pt and she expressed understanding

## 2023-09-22 NOTE — Telephone Encounter (Signed)
Karla Young addressed earlier this week and recommended to get from liver clinic as we have not seen in quite some time.

## 2023-09-26 LAB — CULTURE, BODY FLUID W GRAM STAIN -BOTTLE: Culture: NO GROWTH

## 2023-09-28 ENCOUNTER — Encounter (HOSPITAL_COMMUNITY): Payer: Self-pay

## 2023-09-28 ENCOUNTER — Ambulatory Visit (HOSPITAL_COMMUNITY)
Admission: RE | Admit: 2023-09-28 | Discharge: 2023-09-28 | Disposition: A | Discharge status: Home / Self Care | Payer: 59 | Source: Ambulatory Visit | Attending: Nurse Practitioner | Admitting: Nurse Practitioner

## 2023-09-28 DIAGNOSIS — K766 Portal hypertension: Secondary | ICD-10-CM | POA: Diagnosis not present

## 2023-09-28 DIAGNOSIS — R188 Other ascites: Secondary | ICD-10-CM | POA: Diagnosis not present

## 2023-09-28 DIAGNOSIS — K7460 Unspecified cirrhosis of liver: Secondary | ICD-10-CM | POA: Diagnosis not present

## 2023-09-28 LAB — BASIC METABOLIC PANEL
Anion gap: 8 (ref 5–15)
BUN: 23 mg/dL (ref 8–23)
CO2: 24 mmol/L (ref 22–32)
Calcium: 9.3 mg/dL (ref 8.9–10.3)
Chloride: 104 mmol/L (ref 98–111)
Creatinine, Ser: 0.96 mg/dL (ref 0.44–1.00)
GFR, Estimated: >60 mL/min (ref >60)
Glucose, Bld: 103 mg/dL — ABNORMAL HIGH (ref 70–99)
Potassium: 4.1 mmol/L (ref 3.5–5.1)
Sodium: 136 mmol/L (ref 135–145)

## 2023-09-28 LAB — CBC
HCT: 28.9 % — ABNORMAL LOW (ref 36.0–46.0)
Hemoglobin: 9.8 g/dL — ABNORMAL LOW (ref 12.0–15.0)
MCH: 28.8 pg (ref 26.0–34.0)
MCHC: 33.9 g/dL (ref 30.0–36.0)
MCV: 85 fL (ref 80.0–100.0)
Platelets: 46 10*3/uL — ABNORMAL LOW (ref 150–400)
RBC: 3.4 MIL/uL — ABNORMAL LOW (ref 3.87–5.11)
RDW: 17.2 % — ABNORMAL HIGH (ref 11.5–15.5)
WBC: 5.4 10*3/uL (ref 4.0–10.5)
nRBC: 0 % (ref 0.0–0.2)

## 2023-09-28 LAB — GRAM STAIN

## 2023-09-28 LAB — PROTIME-INR
INR: 1.7 — ABNORMAL HIGH (ref 0.8–1.2)
Prothrombin Time: 19.7 s — ABNORMAL HIGH (ref 11.4–15.2)

## 2023-09-28 LAB — BODY FLUID CELL COUNT WITH DIFFERENTIAL
Eos, Fluid: 1 %
Lymphs, Fluid: 38 %
Monocyte-Macrophage-Serous Fluid: 59 % (ref 50–90)
Neutrophil Count, Fluid: 2 % (ref 0–25)
Total Nucleated Cell Count, Fluid: 68 uL (ref 0–1000)

## 2023-09-28 MED ORDER — ALBUMIN HUMAN 25 % IV SOLN
INTRAVENOUS | Status: AC
  Filled 2023-09-28: qty 150

## 2023-09-28 MED ORDER — ALBUMIN HUMAN 25 % IV SOLN
37.5000 g | Freq: Once | INTRAVENOUS | Status: AC
  Administered 2023-09-28: 37.5 g via INTRAVENOUS

## 2023-09-28 NOTE — Progress Notes (Signed)
Patient tolerated right sided paracentesis and 37.5G of IV albumin well today and 5.3 Liters of clear yellow ascites removed with labs collected and sent for processing. Blood samples also collected and sent to lab for processing that were ordered by Beckey Downing, PA today. Patient verbalized understanding of discharge instructions and ambulatory at departure with no acute distress noted.

## 2023-09-28 NOTE — Procedures (Signed)
   US guided RLQ paracentesis  5.3 L yellow fluid obtained Sent for labs per MD  Tolerated well Scant blood loss  Pt has been evaluated for possible TIPS candidacy  09/23/22 with Dr Elby Showers MELD 22 at that time Deemed not a candidate for TIPs Karla Young is a 63 y.o. 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  She is a poor candidate for TIPS creation due to high MELD (specifically hyperbilirubinemia), and has no current indication for such.  If her hepatic function improves and ascites becomes refractory, she may be re-assessed for TIPS candidacy.   Care Everywhere has labs from [facility] 09/14/23: MELD of 14 ( INR 1.4; TB 2.2; Sod 139; Cr 1.06: )  Discussion with Dr Milford Cage today about re-assessment I have ordered CBC; BMP; INR today Pt is agreeable for new blood work and new consideration of TIPs She is followed with Transplant team at [facility]  Order placed for IR re assessment She will hear from IR OP scheduler

## 2023-10-03 LAB — CULTURE, BODY FLUID W GRAM STAIN -BOTTLE
Culture: NO GROWTH
Special Requests: ADEQUATE

## 2023-10-05 ENCOUNTER — Other Ambulatory Visit: Payer: Self-pay | Admitting: Nurse Practitioner

## 2023-10-05 ENCOUNTER — Encounter (HOSPITAL_COMMUNITY): Payer: Self-pay

## 2023-10-05 ENCOUNTER — Ambulatory Visit (HOSPITAL_COMMUNITY)
Admission: RE | Admit: 2023-10-05 | Discharge: 2023-10-05 | Disposition: A | Discharge status: Home / Self Care | Payer: 59 | Source: Ambulatory Visit | Attending: Nurse Practitioner | Admitting: Nurse Practitioner

## 2023-10-05 DIAGNOSIS — R188 Other ascites: Secondary | ICD-10-CM

## 2023-10-05 DIAGNOSIS — K7031 Alcoholic cirrhosis of liver with ascites: Secondary | ICD-10-CM | POA: Diagnosis not present

## 2023-10-05 DIAGNOSIS — B1920 Unspecified viral hepatitis C without hepatic coma: Secondary | ICD-10-CM | POA: Diagnosis not present

## 2023-10-05 LAB — GRAM STAIN

## 2023-10-05 LAB — BODY FLUID CELL COUNT WITH DIFFERENTIAL
Eos, Fluid: 0 %
Lymphs, Fluid: 49 %
Monocyte-Macrophage-Serous Fluid: 49 % — ABNORMAL LOW (ref 50–90)
Neutrophil Count, Fluid: 2 % (ref 0–25)
Total Nucleated Cell Count, Fluid: 93 uL (ref 0–1000)

## 2023-10-05 MED ORDER — ALBUMIN HUMAN 25 % IV SOLN
37.5000 g | Freq: Once | INTRAVENOUS | Status: AC
  Administered 2023-10-05: 37.5 g via INTRAVENOUS

## 2023-10-05 MED ORDER — ALBUMIN HUMAN 25 % IV SOLN
INTRAVENOUS | Status: AC
  Filled 2023-10-05: qty 150

## 2023-10-05 NOTE — Progress Notes (Signed)
 Patient tolerated right sided paracentesis and 37.5G of IV albumin well today and 4.3 Liters of clear yellow ascites removed with labs collected and sent for processing. Patient verbalized understanding of discharge instructions and ambulatory at departure with no acute distress noted.

## 2023-10-05 NOTE — Procedures (Signed)
 PROCEDURE SUMMARY:  Successful US guided paracentesis from right lateral abdomen.  Yielded 4.3 liters of clear yellow fluid.  No immediate complications.  Patient tolerated well.  EBL = trace  Specimen sent for labs.  Nancie Bocanegra Charmian Muff PA-C 10/05/2023 9:55 AM

## 2023-10-10 LAB — CULTURE, BODY FLUID W GRAM STAIN -BOTTLE: Culture: NO GROWTH

## 2023-10-12 ENCOUNTER — Ambulatory Visit (HOSPITAL_COMMUNITY)
Admission: RE | Admit: 2023-10-12 | Discharge: 2023-10-12 | Disposition: A | Discharge status: Home / Self Care | Payer: 59 | Source: Ambulatory Visit | Attending: Nurse Practitioner | Admitting: Nurse Practitioner

## 2023-10-12 ENCOUNTER — Encounter (HOSPITAL_COMMUNITY): Payer: Self-pay

## 2023-10-12 DIAGNOSIS — R188 Other ascites: Secondary | ICD-10-CM | POA: Insufficient documentation

## 2023-10-12 DIAGNOSIS — K7460 Unspecified cirrhosis of liver: Secondary | ICD-10-CM | POA: Diagnosis present

## 2023-10-12 LAB — BODY FLUID CELL COUNT WITH DIFFERENTIAL
Eos, Fluid: 0 %
Lymphs, Fluid: 43 %
Monocyte-Macrophage-Serous Fluid: 57 % (ref 50–90)
Neutrophil Count, Fluid: 0 % (ref 0–25)
Total Nucleated Cell Count, Fluid: 149 uL (ref 0–1000)

## 2023-10-12 LAB — GRAM STAIN

## 2023-10-12 MED ORDER — ALBUMIN HUMAN 25 % IV SOLN
INTRAVENOUS | Status: AC
  Filled 2023-10-12: qty 150

## 2023-10-12 MED ORDER — LIDOCAINE HCL (PF) 2 % IJ SOLN
INTRAMUSCULAR | Status: AC
  Filled 2023-10-12: qty 10

## 2023-10-12 MED ORDER — ALBUMIN HUMAN 25 % IV SOLN
37.5000 g | Freq: Once | INTRAVENOUS | Status: AC
  Administered 2023-10-12: 37.5 g via INTRAVENOUS

## 2023-10-12 MED ORDER — LIDOCAINE HCL (PF) 2 % IJ SOLN
10.0000 mL | Freq: Once | INTRAMUSCULAR | Status: AC
  Administered 2023-10-12: 10 mL

## 2023-10-12 NOTE — Progress Notes (Signed)
 Patient tolerated right sided Paracentesis and 37.5G of IV albumin well today and 2.7 Liters of clear yellow ascites removed with labs collected and sent for processing. Patient verbalized understanding of discharge instructions and ambulatory at departure with no acute distress noted.

## 2023-10-17 LAB — CULTURE, BODY FLUID W GRAM STAIN -BOTTLE: Culture: NO GROWTH

## 2023-10-19 ENCOUNTER — Ambulatory Visit (HOSPITAL_COMMUNITY)
Admission: RE | Admit: 2023-10-19 | Discharge: 2023-10-19 | Disposition: A | Discharge status: Home / Self Care | Payer: 59 | Source: Ambulatory Visit | Attending: Nurse Practitioner | Admitting: Nurse Practitioner

## 2023-10-19 ENCOUNTER — Encounter (HOSPITAL_COMMUNITY): Payer: Self-pay

## 2023-10-19 DIAGNOSIS — R188 Other ascites: Secondary | ICD-10-CM | POA: Insufficient documentation

## 2023-10-19 DIAGNOSIS — K7460 Unspecified cirrhosis of liver: Secondary | ICD-10-CM | POA: Diagnosis not present

## 2023-10-19 MED ORDER — LIDOCAINE HCL (PF) 2 % IJ SOLN
INTRAMUSCULAR | Status: AC
  Filled 2023-10-19: qty 10

## 2023-10-19 MED ORDER — LIDOCAINE HCL (PF) 2 % IJ SOLN
10.0000 mL | Freq: Once | INTRAMUSCULAR | Status: AC
  Administered 2023-10-19: 10 mL

## 2023-10-19 MED ORDER — ALBUMIN HUMAN 25 % IV SOLN
25.0000 g | Freq: Once | INTRAVENOUS | Status: AC
  Administered 2023-10-19: 25 g via INTRAVENOUS

## 2023-10-19 MED ORDER — ALBUMIN HUMAN 25 % IV SOLN
INTRAVENOUS | Status: AC
  Filled 2023-10-19: qty 100

## 2023-10-19 NOTE — Progress Notes (Signed)
 Patient tolerated right sided paracentesis and 25G of IV albumin well today and 2.7 liters of clear yellow ascites removed. Patient verbalized understanding of discharge instructions and ambulatory at departure with no acute distress noted.

## 2023-10-22 DIAGNOSIS — I951 Orthostatic hypotension: Secondary | ICD-10-CM | POA: Diagnosis not present

## 2023-10-22 DIAGNOSIS — F1021 Alcohol dependence, in remission: Secondary | ICD-10-CM | POA: Diagnosis not present

## 2023-10-22 DIAGNOSIS — Z809 Family history of malignant neoplasm, unspecified: Secondary | ICD-10-CM | POA: Diagnosis not present

## 2023-10-22 DIAGNOSIS — K7040 Alcoholic hepatic failure without coma: Secondary | ICD-10-CM | POA: Diagnosis not present

## 2023-10-22 DIAGNOSIS — N1831 Chronic kidney disease, stage 3a: Secondary | ICD-10-CM | POA: Diagnosis not present

## 2023-10-22 DIAGNOSIS — K7682 Hepatic encephalopathy: Secondary | ICD-10-CM | POA: Diagnosis not present

## 2023-10-22 DIAGNOSIS — K7460 Unspecified cirrhosis of liver: Secondary | ICD-10-CM | POA: Diagnosis not present

## 2023-10-22 DIAGNOSIS — I8510 Secondary esophageal varices without bleeding: Secondary | ICD-10-CM | POA: Diagnosis not present

## 2023-10-22 DIAGNOSIS — Z79890 Hormone replacement therapy (postmenopausal): Secondary | ICD-10-CM | POA: Diagnosis not present

## 2023-10-22 DIAGNOSIS — B199 Unspecified viral hepatitis without hepatic coma: Secondary | ICD-10-CM | POA: Diagnosis not present

## 2023-10-22 DIAGNOSIS — E039 Hypothyroidism, unspecified: Secondary | ICD-10-CM | POA: Diagnosis not present

## 2023-10-22 DIAGNOSIS — K219 Gastro-esophageal reflux disease without esophagitis: Secondary | ICD-10-CM | POA: Diagnosis not present

## 2023-10-24 DIAGNOSIS — K7469 Other cirrhosis of liver: Secondary | ICD-10-CM | POA: Diagnosis not present

## 2023-10-24 DIAGNOSIS — Z01818 Encounter for other preprocedural examination: Secondary | ICD-10-CM | POA: Diagnosis not present

## 2023-10-26 ENCOUNTER — Ambulatory Visit (HOSPITAL_COMMUNITY)
Admission: RE | Admit: 2023-10-26 | Discharge: 2023-10-26 | Disposition: A | Discharge status: Home / Self Care | Payer: 59 | Source: Ambulatory Visit | Attending: Nurse Practitioner | Admitting: Nurse Practitioner

## 2023-10-26 ENCOUNTER — Encounter (HOSPITAL_COMMUNITY): Payer: Self-pay

## 2023-10-26 DIAGNOSIS — K766 Portal hypertension: Secondary | ICD-10-CM | POA: Diagnosis not present

## 2023-10-26 DIAGNOSIS — R188 Other ascites: Secondary | ICD-10-CM

## 2023-10-26 DIAGNOSIS — K7031 Alcoholic cirrhosis of liver with ascites: Secondary | ICD-10-CM | POA: Insufficient documentation

## 2023-10-26 NOTE — Progress Notes (Signed)
 Patient tolerated right sided Paracentesis procedure well today and 3.8 Liters of ascites removed. Patient verbalized understanding of discharge instructions and ambulatory at departure with no acute distress noted.

## 2023-10-26 NOTE — Procedures (Signed)
 PROCEDURE SUMMARY:  Successful ultrasound guided paracentesis from the right lower quadrant.  Yielded 3.8 L of clear yellow fluid.  No immediate complications.  The patient tolerated the procedure well.   Specimen was sent for labs.  EBL < 5mL  The patient has previously been formally evaluated by the [facility] Interventional Radiology Portal Hypertension Clinic and is being actively followed for potential future intervention. She is currently being evaluated for liver transplant at [facility].  [name], PA-C

## 2023-11-02 ENCOUNTER — Ambulatory Visit (HOSPITAL_COMMUNITY)
Admission: RE | Admit: 2023-11-02 | Discharge: 2023-11-02 | Disposition: A | Discharge status: Home / Self Care | Payer: 59 | Source: Ambulatory Visit | Attending: Nurse Practitioner | Admitting: Nurse Practitioner

## 2023-11-02 ENCOUNTER — Encounter (HOSPITAL_COMMUNITY): Payer: Self-pay

## 2023-11-02 DIAGNOSIS — K7031 Alcoholic cirrhosis of liver with ascites: Secondary | ICD-10-CM | POA: Diagnosis not present

## 2023-11-02 DIAGNOSIS — R188 Other ascites: Secondary | ICD-10-CM

## 2023-11-02 NOTE — Procedures (Signed)
 PROCEDURE SUMMARY:  Successful ultrasound guided paracentesis from the right lower quadrant.  Yielded 4.0 L of hazy yellow fluid.  No immediate complications.  The patient tolerated the procedure well.   Specimen was sent for labs.  EBL < 5mL  The patient has previously been formally evaluated by the [facility] Interventional Radiology Portal Hypertension Clinic and is being actively followed for potential future intervention. She is currently being evaluated for liver transplant at [facility].   [name], PA-C

## 2023-11-02 NOTE — Progress Notes (Signed)
 Patient tolerated right sided Paracentesis procedure well today and 4 Liters of clear yellow ascites removed. Patient verbalized understanding of discharge instructions and ambulatory at departure with no acute distress noted.

## 2023-11-08 NOTE — Patient Instructions (Signed)
 Visit Information  Thank you for taking time to visit with me today. Please don't hesitate to contact me if I can be of assistance to you.   Following are the goals we discussed today:   Goals Addressed             This Visit's Progress    Assist with personal care services,  Therapy, AA program, medicaid - RN CM care coordination       Interventions Today    Flowsheet Row Most Recent Value  Chronic Disease   Chronic disease during today's visit Other  General Interventions   General Interventions Discussed/Reviewed General Interventions Reviewed, Doctor Visits, Community Resources  Doctor Visits Discussed/Reviewed Doctor Visits Reviewed, PCP, Specialist  PCP/Specialist Visits Compliance with follow-up visit  Exercise Interventions   Exercise Discussed/Reviewed Exercise Reviewed, Physical Activity  [walked around the block recently - went out with friends]  Physical Activity Discussed/Reviewed Physical Activity Reviewed, Types of exercise, Home Exercise Program (HEP)  Education Interventions   Education Provided Provided Education  Provided Verbal Education On Community Resources  Mental Health Interventions   Mental Health Discussed/Reviewed Mental Health Reviewed, Coping Strategies, Anxiety, Other  [improvements reports]  Pharmacy Interventions   Pharmacy Dicussed/Reviewed Pharmacy Topics Reviewed, Affording Medications  Safety Interventions   Safety Discussed/Reviewed Safety Reviewed, Fall Risk, Home Safety  Home Safety Assistive Devices              Our next appointment is by telephone on as needed at as needed  Please call the care guide team at [phone] if you need to cancel or reschedule your appointment.   If you are experiencing a Mental Health or Behavioral Health Crisis or need someone to talk to, please call the Suicide and Crisis Lifeline: 988 call the [place] National Suicide Prevention Lifeline: 984-053-8081 or TTY: 757-184-7057 TTY (773)448-5779) to  talk to a trained counselor call 1-800-273-TALK (toll free, 24 hour hotline) go to [facility] [address], [place] [id number]) call the [facility] Crisis Line: (256) 772-6660 call 911   Patient verbalizes understanding of instructions and care plan provided today and agrees to view in MyChart. Active MyChart status and patient understanding of how to access instructions and care plan via MyChart confirmed with patient.     The patient has been provided with contact information for the care management team and has been advised to call with any health related questions or concerns.   Dalina Samara L. Noelle Penner, RN, BSN, CCM [facility] RN Care Manager [phone]

## 2023-11-08 NOTE — Patient Instructions (Signed)
 Visit Information  Thank you for taking time to visit with me today. Please don't hesitate to contact me if I can be of assistance to you.   Following are the goals we discussed today:   Goals Addressed             This Visit's Progress    Assist with personal care services,  Therapy, AA program, medicaid - RN CM care coordination       Interventions Today    Flowsheet Row Most Recent Value  Chronic Disease   Chronic disease during today's visit Other  [personal care services, therapy,]  General Interventions   General Interventions Discussed/Reviewed General Interventions Reviewed, Community Resources, Doctor Visits  Doctor Visits Discussed/Reviewed Doctor Visits Reviewed, Specialist  PCP/Specialist Visits Compliance with follow-up visit  Mental Health Interventions   Mental Health Discussed/Reviewed Mental Health Reviewed, Coping Strategies              Our next appointment is by telephone on 04/19/23 at 3 pm  Please call the care guide team at [phone] if you need to cancel or reschedule your appointment.   If you are experiencing a Mental Health or Behavioral Health Crisis or need someone to talk to, please call the Suicide and Crisis Lifeline: 988 call the [place] National Suicide Prevention Lifeline: 415-113-2638 or TTY: (954)434-5417 TTY (941)136-9497) to talk to a trained counselor call 1-800-273-TALK (toll free, 24 hour hotline) call the [facility]: 307-278-8630 call 911   Patient verbalizes understanding of instructions and care plan provided today and agrees to view in MyChart. Active MyChart status and patient understanding of how to access instructions and care plan via MyChart confirmed with patient.     The patient has been provided with contact information for the care management team and has been advised to call with any health related questions or concerns.   Francille Wittmann L. Noelle Penner, RN, BSN, CCM [facility] RN Care Manager [phone]

## 2023-11-09 ENCOUNTER — Other Ambulatory Visit (HOSPITAL_COMMUNITY): Payer: Self-pay | Admitting: Nurse Practitioner

## 2023-11-09 ENCOUNTER — Ambulatory Visit (HOSPITAL_COMMUNITY)
Admission: RE | Admit: 2023-11-09 | Discharge: 2023-11-09 | Disposition: A | Discharge status: Home / Self Care | Payer: 59 | Source: Ambulatory Visit | Attending: Nurse Practitioner | Admitting: Nurse Practitioner

## 2023-11-09 ENCOUNTER — Encounter (HOSPITAL_COMMUNITY): Payer: Self-pay

## 2023-11-09 DIAGNOSIS — R188 Other ascites: Secondary | ICD-10-CM

## 2023-11-09 DIAGNOSIS — K7031 Alcoholic cirrhosis of liver with ascites: Secondary | ICD-10-CM | POA: Insufficient documentation

## 2023-11-09 MED ORDER — ALBUMIN HUMAN 25 % IV SOLN
INTRAVENOUS | Status: AC
  Filled 2023-11-09: qty 100

## 2023-11-09 MED ORDER — ALBUMIN HUMAN 25 % IV SOLN
25.0000 g | Freq: Once | INTRAVENOUS | Status: AC
  Administered 2023-11-09: 25 g via INTRAVENOUS

## 2023-11-09 NOTE — Progress Notes (Signed)
 Right sided paracentesis procedure and 25G of  IV Albumin tolerated well today and 4.4 L of yellow ascites removed. PT verbalized understanding of discharge instructions and ambulatory at departure with no acute distress noted.

## 2023-11-09 NOTE — Procedures (Signed)
 PROCEDURE SUMMARY:  Successful image-guided paracentesis from the right lower abdomen.  Yielded 4.4 liters of clear, yellow fluid.  No immediate complications.  EBL = trace. Patient tolerated well.   Please see imaging section of Epic for full dictation.  The patient has previously been formally evaluated by the [facility] Interventional Radiology Portal Hypertension Clinic and is being actively followed for potential future intervention. Pt is being evaluated by [facility] for liver transplant.     Loman Brooklyn PA-C 11/09/2023 9:39 AM

## 2023-11-13 ENCOUNTER — Other Ambulatory Visit (INDEPENDENT_AMBULATORY_CARE_PROVIDER_SITE_OTHER): Payer: Self-pay | Admitting: Gastroenterology

## 2023-11-16 ENCOUNTER — Encounter (HOSPITAL_COMMUNITY): Payer: Self-pay

## 2023-11-16 ENCOUNTER — Ambulatory Visit (HOSPITAL_COMMUNITY)
Admission: RE | Admit: 2023-11-16 | Discharge: 2023-11-16 | Disposition: A | Discharge status: Home / Self Care | Source: Ambulatory Visit | Attending: Nurse Practitioner | Admitting: Nurse Practitioner

## 2023-11-16 DIAGNOSIS — Z01818 Encounter for other preprocedural examination: Secondary | ICD-10-CM | POA: Diagnosis not present

## 2023-11-16 DIAGNOSIS — K7031 Alcoholic cirrhosis of liver with ascites: Secondary | ICD-10-CM | POA: Diagnosis not present

## 2023-11-16 DIAGNOSIS — R188 Other ascites: Secondary | ICD-10-CM

## 2023-11-16 MED ORDER — ALBUMIN HUMAN 25 % IV SOLN
INTRAVENOUS | Status: AC
  Filled 2023-11-16: qty 100

## 2023-11-16 MED ORDER — ALBUMIN HUMAN 25 % IV SOLN
25.0000 g | Freq: Once | INTRAVENOUS | Status: AC
  Administered 2023-11-16: 25 g via INTRAVENOUS

## 2023-11-16 NOTE — Procedures (Signed)
 PROCEDURE SUMMARY:  Successful ultrasound guided paracentesis from the right lower quadrant.  Yielded 4.3 L of clear yellow fluid.  No immediate complications.  The patient tolerated the procedure well.   Specimen was not sent for labs.  EBL < 5mL  The patient has previously been formally evaluated by the [facility] Interventional Radiology Portal Hypertension Clinic and is being actively followed for potential future intervention. She is currently on the waiting list for a liver transplant through [facility].  [name], PA-C

## 2023-11-16 NOTE — Progress Notes (Signed)
 Right sided paracentesis procedure and 25G of Albumin tolerated well today and 4.3 L of yellow ascites removed. PT verbalized understanding of discharge instructions and ambulatory at departure with no acute distress noted.

## 2023-11-20 ENCOUNTER — Other Ambulatory Visit (INDEPENDENT_AMBULATORY_CARE_PROVIDER_SITE_OTHER): Payer: Self-pay | Admitting: Gastroenterology

## 2023-11-23 ENCOUNTER — Ambulatory Visit (HOSPITAL_COMMUNITY)
Admission: RE | Admit: 2023-11-23 | Discharge: 2023-11-23 | Disposition: A | Discharge status: Home / Self Care | Source: Ambulatory Visit | Attending: Nurse Practitioner | Admitting: Nurse Practitioner

## 2023-11-23 ENCOUNTER — Encounter (HOSPITAL_COMMUNITY): Payer: Self-pay

## 2023-11-23 DIAGNOSIS — Z7682 Awaiting organ transplant status: Secondary | ICD-10-CM | POA: Insufficient documentation

## 2023-11-23 DIAGNOSIS — K7031 Alcoholic cirrhosis of liver with ascites: Secondary | ICD-10-CM | POA: Insufficient documentation

## 2023-11-23 DIAGNOSIS — R188 Other ascites: Secondary | ICD-10-CM

## 2023-11-23 MED ORDER — ALBUMIN HUMAN 25 % IV SOLN
INTRAVENOUS | Status: AC
  Filled 2023-11-23: qty 100

## 2023-11-23 MED ORDER — ALBUMIN HUMAN 25 % IV SOLN
25.0000 g | Freq: Once | INTRAVENOUS | Status: AC
  Administered 2023-11-23: 25 g via INTRAVENOUS

## 2023-11-23 NOTE — Procedures (Signed)
 PROCEDURE SUMMARY:  Successful ultrasound guided paracentesis from the right lower quadrant.  Yielded 3.9 L of clear yellow fluid.  No immediate complications.  The patient tolerated the procedure well.   Specimen was not sent for labs.  EBL < 5mL  The patient has previously been formally evaluated by the [facility] Interventional Radiology Portal Hypertension Clinic and is being actively followed for potential future intervention. She is currently on the wait list for a liver transplant through [facility].  Nathan Bake, PA-C

## 2023-11-23 NOTE — Progress Notes (Addendum)
 Patient tolerated right sided Paracentesis procedure well and 25G of IV albumin. 3.9 Liters of clear yellow ascites removed. Patient verbalized understanding of discharge instructions and ambulatory at departure with no acute distress noted.

## 2023-11-24 ENCOUNTER — Other Ambulatory Visit (HOSPITAL_COMMUNITY): Payer: Self-pay | Admitting: Nurse Practitioner

## 2023-11-24 DIAGNOSIS — R188 Other ascites: Secondary | ICD-10-CM

## 2023-11-30 ENCOUNTER — Ambulatory Visit (HOSPITAL_COMMUNITY)
Admission: RE | Admit: 2023-11-30 | Discharge: 2023-11-30 | Disposition: A | Discharge status: Home / Self Care | Source: Ambulatory Visit | Attending: Nurse Practitioner | Admitting: Nurse Practitioner

## 2023-11-30 ENCOUNTER — Encounter (HOSPITAL_COMMUNITY): Payer: Self-pay

## 2023-11-30 DIAGNOSIS — R188 Other ascites: Secondary | ICD-10-CM | POA: Diagnosis not present

## 2023-11-30 DIAGNOSIS — F1011 Alcohol abuse, in remission: Secondary | ICD-10-CM | POA: Insufficient documentation

## 2023-11-30 DIAGNOSIS — Z7682 Awaiting organ transplant status: Secondary | ICD-10-CM | POA: Diagnosis not present

## 2023-11-30 DIAGNOSIS — K7031 Alcoholic cirrhosis of liver with ascites: Secondary | ICD-10-CM | POA: Diagnosis not present

## 2023-11-30 MED ORDER — LIDOCAINE HCL (PF) 2 % IJ SOLN
INTRAMUSCULAR | Status: AC
  Filled 2023-11-30: qty 10

## 2023-11-30 MED ORDER — LIDOCAINE HCL (PF) 2 % IJ SOLN
10.0000 mL | Freq: Once | INTRAMUSCULAR | Status: AC
  Administered 2023-11-30: 10 mL via INTRADERMAL

## 2023-11-30 MED ORDER — ALBUMIN HUMAN 25 % IV SOLN
INTRAVENOUS | Status: AC
  Filled 2023-11-30: qty 100

## 2023-11-30 NOTE — Progress Notes (Signed)
 Pt escorted to US  1 in no obvious distress. Procedure explained, consent obtained. US  imaging obtained of abdomen. Access obtained at RLQ without difficulty. 3.7 L clear amber  fluid retrieved under vacutainer suction without difficulty. Access removed without inceident. Dermabond applied, bandage in place. No evidence of bleeding/hemorrhage. Escorted to Rad waiting for DC.

## 2023-11-30 NOTE — Procedures (Signed)
 PROCEDURE SUMMARY:  Successful ultrasound guided paracentesis from the right lower quadrant.  Yielded 3.7 L of clear yellow fluid.  No immediate complications.  The patient tolerated the procedure well.   Specimen not sent for labs.  EBL < 2 mL  ** Patient currently on the liver transplant list with [facility].  Jetta Morrow, AGACNP-BC 11/30/2023, 11:22 AM

## 2023-12-06 DIAGNOSIS — K7682 Hepatic encephalopathy: Secondary | ICD-10-CM | POA: Diagnosis not present

## 2023-12-06 DIAGNOSIS — R188 Other ascites: Secondary | ICD-10-CM | POA: Diagnosis not present

## 2023-12-06 DIAGNOSIS — I8510 Secondary esophageal varices without bleeding: Secondary | ICD-10-CM | POA: Diagnosis not present

## 2023-12-06 DIAGNOSIS — K7469 Other cirrhosis of liver: Secondary | ICD-10-CM | POA: Diagnosis not present

## 2023-12-07 ENCOUNTER — Ambulatory Visit (HOSPITAL_COMMUNITY)
Admission: RE | Admit: 2023-12-07 | Discharge: 2023-12-07 | Disposition: A | Discharge status: Home / Self Care | Source: Ambulatory Visit | Attending: Nurse Practitioner | Admitting: Nurse Practitioner

## 2023-12-07 ENCOUNTER — Encounter (HOSPITAL_COMMUNITY): Payer: Self-pay

## 2023-12-07 DIAGNOSIS — Z7682 Awaiting organ transplant status: Secondary | ICD-10-CM | POA: Diagnosis not present

## 2023-12-07 DIAGNOSIS — R188 Other ascites: Secondary | ICD-10-CM | POA: Diagnosis present

## 2023-12-07 MED ORDER — ALBUMIN HUMAN 25 % IV SOLN
25.0000 g | Freq: Once | INTRAVENOUS | Status: AC
  Administered 2023-12-07: 25 g via INTRAVENOUS

## 2023-12-07 MED ORDER — ALBUMIN HUMAN 25 % IV SOLN
INTRAVENOUS | Status: AC
  Filled 2023-12-07: qty 100

## 2023-12-07 NOTE — Progress Notes (Signed)
 Right sided paracentesis procedure and 25G of  IV Albumin  tolerated well today and 4.6 L of yellow ascites removed. PT verbalized understanding of discharge instructions and ambulatory at departure with no acute distress noted.

## 2023-12-07 NOTE — Procedures (Signed)
 PROCEDURE SUMMARY:  Successful ultrasound guided paracentesis from the right lower quadrant.  Yielded 4.6 L of clear yellow fluid.  No immediate complications.  The patient tolerated the procedure well.   Specimen not sent for labs.  EBL < 2 mL   ** Patient currently on the liver transplant list with [facility].    Fonda Rochon, AGACNP-BC 12/07/2023, 11:10 AM

## 2023-12-08 ENCOUNTER — Other Ambulatory Visit (HOSPITAL_COMMUNITY): Payer: Self-pay | Admitting: Nurse Practitioner

## 2023-12-08 DIAGNOSIS — R188 Other ascites: Secondary | ICD-10-CM

## 2023-12-14 ENCOUNTER — Ambulatory Visit (HOSPITAL_COMMUNITY)
Admission: RE | Admit: 2023-12-14 | Discharge: 2023-12-14 | Disposition: A | Discharge status: Home / Self Care | Source: Ambulatory Visit | Attending: Nurse Practitioner | Admitting: Nurse Practitioner

## 2023-12-14 ENCOUNTER — Encounter (HOSPITAL_COMMUNITY): Payer: Self-pay

## 2023-12-14 DIAGNOSIS — R188 Other ascites: Secondary | ICD-10-CM | POA: Insufficient documentation

## 2023-12-14 DIAGNOSIS — Z7682 Awaiting organ transplant status: Secondary | ICD-10-CM | POA: Insufficient documentation

## 2023-12-14 DIAGNOSIS — K7031 Alcoholic cirrhosis of liver with ascites: Secondary | ICD-10-CM | POA: Diagnosis not present

## 2023-12-14 MED ORDER — ALBUMIN HUMAN 25 % IV SOLN
INTRAVENOUS | Status: AC
Start: 2023-12-14 — End: ?
  Filled 2023-12-14: qty 100

## 2023-12-14 MED ORDER — ALBUMIN HUMAN 25 % IV SOLN
25.0000 g | Freq: Once | INTRAVENOUS | Status: AC
  Administered 2023-12-14: 25 g via INTRAVENOUS

## 2023-12-14 MED ORDER — LIDOCAINE HCL (PF) 2 % IJ SOLN
INTRAMUSCULAR | Status: AC
Start: 2023-12-14 — End: ?
  Filled 2023-12-14: qty 10

## 2023-12-14 NOTE — Procedures (Signed)
 PROCEDURE SUMMARY:  Successful ultrasound guided paracentesis from the right lower quadrant.  Yielded 3.7 L of clear yellow fluid.  No immediate complications.  The patient tolerated the procedure well.   Specimen not sent for labs.  EBL < 2 mL  ** Patient is currently on the liver transplant waiting list with [facility].   Mariabella Nilsen, AGACNP-BC 12/14/2023, 11:30 AM

## 2023-12-14 NOTE — Progress Notes (Signed)
 Patient tolerated right sided Paracentesis procedure and 25 G of IV albumin  well today and 3.7 Liters of clear yellow ascites removed. Patient verbalized understanding of discharge instructions and ambulatory at departure with no acute distress noted.

## 2023-12-15 ENCOUNTER — Other Ambulatory Visit (INDEPENDENT_AMBULATORY_CARE_PROVIDER_SITE_OTHER): Payer: Self-pay | Admitting: Gastroenterology

## 2023-12-16 HISTORY — PX: LIVER TRANSPLANT: SHX410

## 2023-12-17 DIAGNOSIS — K7210 Chronic hepatic failure without coma: Secondary | ICD-10-CM | POA: Diagnosis not present

## 2023-12-17 DIAGNOSIS — K766 Portal hypertension: Secondary | ICD-10-CM | POA: Diagnosis not present

## 2023-12-17 DIAGNOSIS — K7290 Hepatic failure, unspecified without coma: Secondary | ICD-10-CM | POA: Diagnosis not present

## 2023-12-17 DIAGNOSIS — K7460 Unspecified cirrhosis of liver: Secondary | ICD-10-CM | POA: Diagnosis not present

## 2023-12-17 DIAGNOSIS — R188 Other ascites: Secondary | ICD-10-CM | POA: Diagnosis not present

## 2023-12-17 DIAGNOSIS — E039 Hypothyroidism, unspecified: Secondary | ICD-10-CM | POA: Diagnosis not present

## 2023-12-17 DIAGNOSIS — J449 Chronic obstructive pulmonary disease, unspecified: Secondary | ICD-10-CM | POA: Diagnosis not present

## 2023-12-18 DIAGNOSIS — K7030 Alcoholic cirrhosis of liver without ascites: Secondary | ICD-10-CM | POA: Diagnosis not present

## 2023-12-18 DIAGNOSIS — R739 Hyperglycemia, unspecified: Secondary | ICD-10-CM | POA: Diagnosis not present

## 2023-12-18 DIAGNOSIS — D62 Acute posthemorrhagic anemia: Secondary | ICD-10-CM | POA: Diagnosis not present

## 2023-12-18 DIAGNOSIS — Z944 Liver transplant status: Secondary | ICD-10-CM | POA: Diagnosis not present

## 2023-12-18 DIAGNOSIS — J9601 Acute respiratory failure with hypoxia: Secondary | ICD-10-CM | POA: Diagnosis not present

## 2023-12-18 DIAGNOSIS — N179 Acute kidney failure, unspecified: Secondary | ICD-10-CM | POA: Diagnosis not present

## 2023-12-18 DIAGNOSIS — E876 Hypokalemia: Secondary | ICD-10-CM | POA: Diagnosis not present

## 2023-12-19 DIAGNOSIS — T380X5A Adverse effect of glucocorticoids and synthetic analogues, initial encounter: Secondary | ICD-10-CM | POA: Diagnosis not present

## 2023-12-19 DIAGNOSIS — D72829 Elevated white blood cell count, unspecified: Secondary | ICD-10-CM | POA: Diagnosis not present

## 2023-12-19 DIAGNOSIS — D62 Acute posthemorrhagic anemia: Secondary | ICD-10-CM | POA: Diagnosis not present

## 2023-12-19 DIAGNOSIS — Z7960 Long term (current) use of unspecified immunomodulators and immunosuppressants: Secondary | ICD-10-CM | POA: Diagnosis not present

## 2023-12-19 DIAGNOSIS — D696 Thrombocytopenia, unspecified: Secondary | ICD-10-CM | POA: Diagnosis not present

## 2023-12-19 DIAGNOSIS — Z794 Long term (current) use of insulin: Secondary | ICD-10-CM | POA: Diagnosis not present

## 2023-12-19 DIAGNOSIS — R34 Anuria and oliguria: Secondary | ICD-10-CM | POA: Diagnosis not present

## 2023-12-19 DIAGNOSIS — Z944 Liver transplant status: Secondary | ICD-10-CM | POA: Diagnosis not present

## 2023-12-19 DIAGNOSIS — R739 Hyperglycemia, unspecified: Secondary | ICD-10-CM | POA: Diagnosis not present

## 2023-12-20 DIAGNOSIS — F419 Anxiety disorder, unspecified: Secondary | ICD-10-CM | POA: Diagnosis not present

## 2023-12-20 DIAGNOSIS — E872 Acidosis, unspecified: Secondary | ICD-10-CM | POA: Diagnosis not present

## 2023-12-20 DIAGNOSIS — D696 Thrombocytopenia, unspecified: Secondary | ICD-10-CM | POA: Diagnosis not present

## 2023-12-20 DIAGNOSIS — R062 Wheezing: Secondary | ICD-10-CM | POA: Diagnosis not present

## 2023-12-20 DIAGNOSIS — D72829 Elevated white blood cell count, unspecified: Secondary | ICD-10-CM | POA: Diagnosis not present

## 2023-12-20 DIAGNOSIS — Z944 Liver transplant status: Secondary | ICD-10-CM | POA: Diagnosis not present

## 2023-12-20 DIAGNOSIS — D62 Acute posthemorrhagic anemia: Secondary | ICD-10-CM | POA: Diagnosis not present

## 2023-12-20 DIAGNOSIS — R0602 Shortness of breath: Secondary | ICD-10-CM | POA: Diagnosis not present

## 2023-12-20 DIAGNOSIS — R34 Anuria and oliguria: Secondary | ICD-10-CM | POA: Diagnosis not present

## 2023-12-20 DIAGNOSIS — R0600 Dyspnea, unspecified: Secondary | ICD-10-CM | POA: Diagnosis not present

## 2023-12-21 ENCOUNTER — Ambulatory Visit (HOSPITAL_COMMUNITY)

## 2023-12-21 DIAGNOSIS — Z794 Long term (current) use of insulin: Secondary | ICD-10-CM | POA: Diagnosis not present

## 2023-12-21 DIAGNOSIS — Z5181 Encounter for therapeutic drug level monitoring: Secondary | ICD-10-CM | POA: Diagnosis not present

## 2023-12-21 DIAGNOSIS — T8649 Other complications of liver transplant: Secondary | ICD-10-CM | POA: Diagnosis not present

## 2023-12-21 DIAGNOSIS — T380X5A Adverse effect of glucocorticoids and synthetic analogues, initial encounter: Secondary | ICD-10-CM | POA: Diagnosis not present

## 2023-12-21 DIAGNOSIS — K7031 Alcoholic cirrhosis of liver with ascites: Secondary | ICD-10-CM | POA: Diagnosis not present

## 2023-12-21 DIAGNOSIS — N179 Acute kidney failure, unspecified: Secondary | ICD-10-CM | POA: Diagnosis not present

## 2023-12-21 DIAGNOSIS — Z4823 Encounter for aftercare following liver transplant: Secondary | ICD-10-CM | POA: Diagnosis not present

## 2023-12-21 DIAGNOSIS — R739 Hyperglycemia, unspecified: Secondary | ICD-10-CM | POA: Diagnosis not present

## 2023-12-21 DIAGNOSIS — Z944 Liver transplant status: Secondary | ICD-10-CM | POA: Diagnosis not present

## 2023-12-21 DIAGNOSIS — E872 Acidosis, unspecified: Secondary | ICD-10-CM | POA: Diagnosis not present

## 2023-12-22 DIAGNOSIS — Z992 Dependence on renal dialysis: Secondary | ICD-10-CM | POA: Diagnosis not present

## 2023-12-22 DIAGNOSIS — Z944 Liver transplant status: Secondary | ICD-10-CM | POA: Diagnosis not present

## 2023-12-22 DIAGNOSIS — Z4823 Encounter for aftercare following liver transplant: Secondary | ICD-10-CM | POA: Diagnosis not present

## 2023-12-22 DIAGNOSIS — N179 Acute kidney failure, unspecified: Secondary | ICD-10-CM | POA: Diagnosis not present

## 2023-12-22 DIAGNOSIS — Z794 Long term (current) use of insulin: Secondary | ICD-10-CM | POA: Diagnosis not present

## 2023-12-22 DIAGNOSIS — Z5181 Encounter for therapeutic drug level monitoring: Secondary | ICD-10-CM | POA: Diagnosis not present

## 2023-12-22 DIAGNOSIS — E872 Acidosis, unspecified: Secondary | ICD-10-CM | POA: Diagnosis not present

## 2023-12-22 DIAGNOSIS — R739 Hyperglycemia, unspecified: Secondary | ICD-10-CM | POA: Diagnosis not present

## 2023-12-22 DIAGNOSIS — T380X5A Adverse effect of glucocorticoids and synthetic analogues, initial encounter: Secondary | ICD-10-CM | POA: Diagnosis not present

## 2023-12-23 DIAGNOSIS — Z944 Liver transplant status: Secondary | ICD-10-CM | POA: Diagnosis not present

## 2023-12-23 DIAGNOSIS — R739 Hyperglycemia, unspecified: Secondary | ICD-10-CM | POA: Diagnosis not present

## 2023-12-23 DIAGNOSIS — Z992 Dependence on renal dialysis: Secondary | ICD-10-CM | POA: Diagnosis not present

## 2023-12-23 DIAGNOSIS — Z4823 Encounter for aftercare following liver transplant: Secondary | ICD-10-CM | POA: Diagnosis not present

## 2023-12-23 DIAGNOSIS — Z794 Long term (current) use of insulin: Secondary | ICD-10-CM | POA: Diagnosis not present

## 2023-12-23 DIAGNOSIS — N179 Acute kidney failure, unspecified: Secondary | ICD-10-CM | POA: Diagnosis not present

## 2023-12-23 DIAGNOSIS — T380X5A Adverse effect of glucocorticoids and synthetic analogues, initial encounter: Secondary | ICD-10-CM | POA: Diagnosis not present

## 2023-12-23 DIAGNOSIS — Z5181 Encounter for therapeutic drug level monitoring: Secondary | ICD-10-CM | POA: Diagnosis not present

## 2023-12-23 DIAGNOSIS — E872 Acidosis, unspecified: Secondary | ICD-10-CM | POA: Diagnosis not present

## 2023-12-24 DIAGNOSIS — N179 Acute kidney failure, unspecified: Secondary | ICD-10-CM | POA: Diagnosis not present

## 2023-12-24 DIAGNOSIS — Z4823 Encounter for aftercare following liver transplant: Secondary | ICD-10-CM | POA: Diagnosis not present

## 2023-12-24 DIAGNOSIS — E872 Acidosis, unspecified: Secondary | ICD-10-CM | POA: Diagnosis not present

## 2023-12-24 DIAGNOSIS — Z992 Dependence on renal dialysis: Secondary | ICD-10-CM | POA: Diagnosis not present

## 2023-12-24 DIAGNOSIS — Z5181 Encounter for therapeutic drug level monitoring: Secondary | ICD-10-CM | POA: Diagnosis not present

## 2023-12-24 DIAGNOSIS — Z944 Liver transplant status: Secondary | ICD-10-CM | POA: Diagnosis not present

## 2023-12-28 ENCOUNTER — Ambulatory Visit (HOSPITAL_COMMUNITY)

## 2023-12-28 DIAGNOSIS — R252 Cramp and spasm: Secondary | ICD-10-CM | POA: Diagnosis not present

## 2023-12-28 DIAGNOSIS — M542 Cervicalgia: Secondary | ICD-10-CM | POA: Diagnosis not present

## 2023-12-28 DIAGNOSIS — Z4823 Encounter for aftercare following liver transplant: Secondary | ICD-10-CM | POA: Diagnosis not present

## 2023-12-28 DIAGNOSIS — Z944 Liver transplant status: Secondary | ICD-10-CM | POA: Diagnosis not present

## 2023-12-28 DIAGNOSIS — N179 Acute kidney failure, unspecified: Secondary | ICD-10-CM | POA: Diagnosis not present

## 2023-12-29 DIAGNOSIS — R918 Other nonspecific abnormal finding of lung field: Secondary | ICD-10-CM | POA: Diagnosis not present

## 2023-12-30 DIAGNOSIS — T8649 Other complications of liver transplant: Secondary | ICD-10-CM | POA: Diagnosis not present

## 2023-12-30 DIAGNOSIS — N179 Acute kidney failure, unspecified: Secondary | ICD-10-CM | POA: Diagnosis not present

## 2023-12-30 DIAGNOSIS — R252 Cramp and spasm: Secondary | ICD-10-CM | POA: Diagnosis not present

## 2023-12-30 DIAGNOSIS — K7031 Alcoholic cirrhosis of liver with ascites: Secondary | ICD-10-CM | POA: Diagnosis not present

## 2023-12-30 DIAGNOSIS — Z944 Liver transplant status: Secondary | ICD-10-CM | POA: Diagnosis not present

## 2023-12-30 DIAGNOSIS — M542 Cervicalgia: Secondary | ICD-10-CM | POA: Diagnosis not present

## 2023-12-30 DIAGNOSIS — Z4823 Encounter for aftercare following liver transplant: Secondary | ICD-10-CM | POA: Diagnosis not present

## 2023-12-31 DIAGNOSIS — I498 Other specified cardiac arrhythmias: Secondary | ICD-10-CM | POA: Diagnosis not present

## 2023-12-31 DIAGNOSIS — Z944 Liver transplant status: Secondary | ICD-10-CM | POA: Diagnosis not present

## 2023-12-31 DIAGNOSIS — N179 Acute kidney failure, unspecified: Secondary | ICD-10-CM | POA: Diagnosis not present

## 2024-01-01 DIAGNOSIS — N179 Acute kidney failure, unspecified: Secondary | ICD-10-CM | POA: Diagnosis not present

## 2024-01-01 DIAGNOSIS — Z944 Liver transplant status: Secondary | ICD-10-CM | POA: Diagnosis not present

## 2024-01-02 DIAGNOSIS — Z0389 Encounter for observation for other suspected diseases and conditions ruled out: Secondary | ICD-10-CM | POA: Diagnosis not present

## 2024-01-03 DIAGNOSIS — R188 Other ascites: Secondary | ICD-10-CM | POA: Diagnosis not present

## 2024-01-04 ENCOUNTER — Ambulatory Visit (HOSPITAL_COMMUNITY): Admission: RE | Admit: 2024-01-04 | Source: Ambulatory Visit

## 2024-01-04 DIAGNOSIS — Z944 Liver transplant status: Secondary | ICD-10-CM | POA: Diagnosis not present

## 2024-01-04 DIAGNOSIS — Z136 Encounter for screening for cardiovascular disorders: Secondary | ICD-10-CM | POA: Diagnosis not present

## 2024-01-04 DIAGNOSIS — N179 Acute kidney failure, unspecified: Secondary | ICD-10-CM | POA: Diagnosis not present

## 2024-01-04 DIAGNOSIS — K7031 Alcoholic cirrhosis of liver with ascites: Secondary | ICD-10-CM | POA: Diagnosis not present

## 2024-01-04 DIAGNOSIS — T8649 Other complications of liver transplant: Secondary | ICD-10-CM | POA: Diagnosis not present

## 2024-01-04 IMAGING — MG MM DIGITAL SCREENING BILAT W/ TOMO AND CAD
8 series · 8 of 24 positions shown · non-contrast
Comparison: Previous exam(s).

CLINICAL DATA: Screening.

EXAM:
DIGITAL SCREENING BILATERAL MAMMOGRAM WITH TOMOSYNTHESIS AND CAD
TECHNIQUE: Bilateral screening digital craniocaudal and mediolateral oblique
mammograms were obtained. Bilateral screening digital breast
tomosynthesis was performed. The images were evaluated with
computer-aided detection.

[R CC synth-2D]
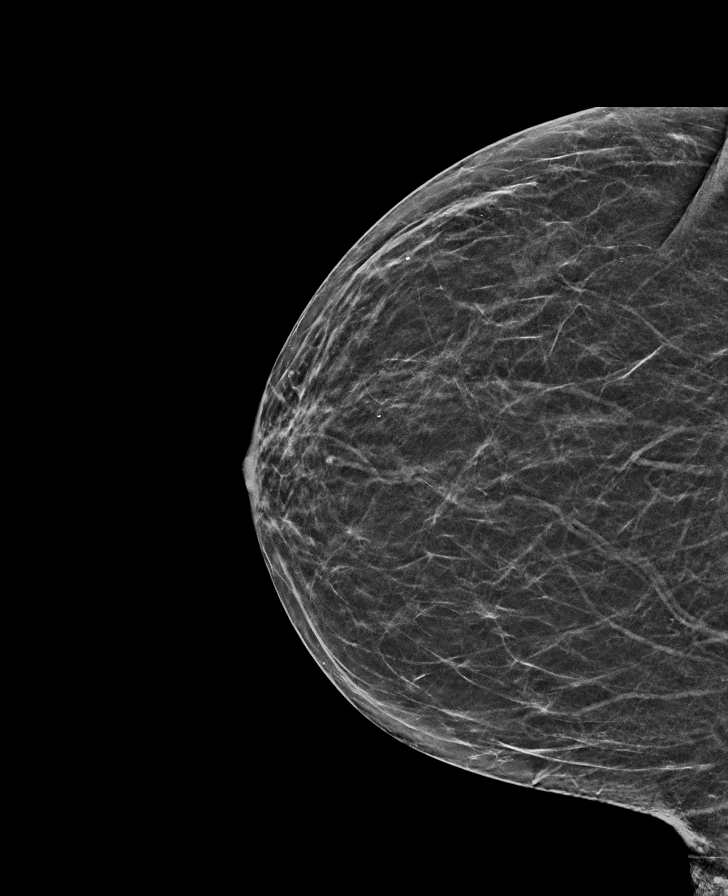

[L CC synth-2D]
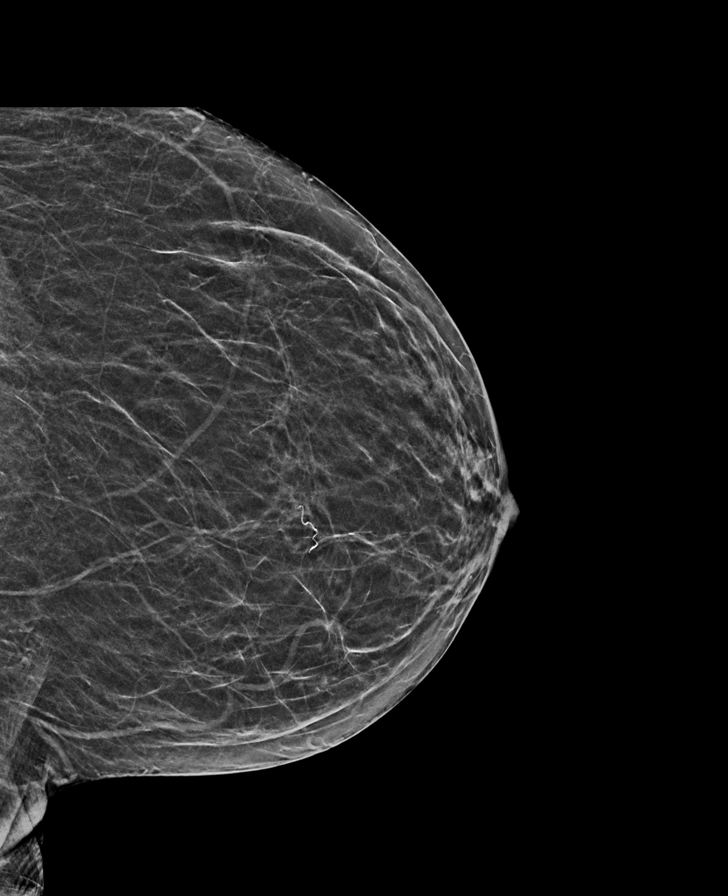

[L MLO synth-2D]
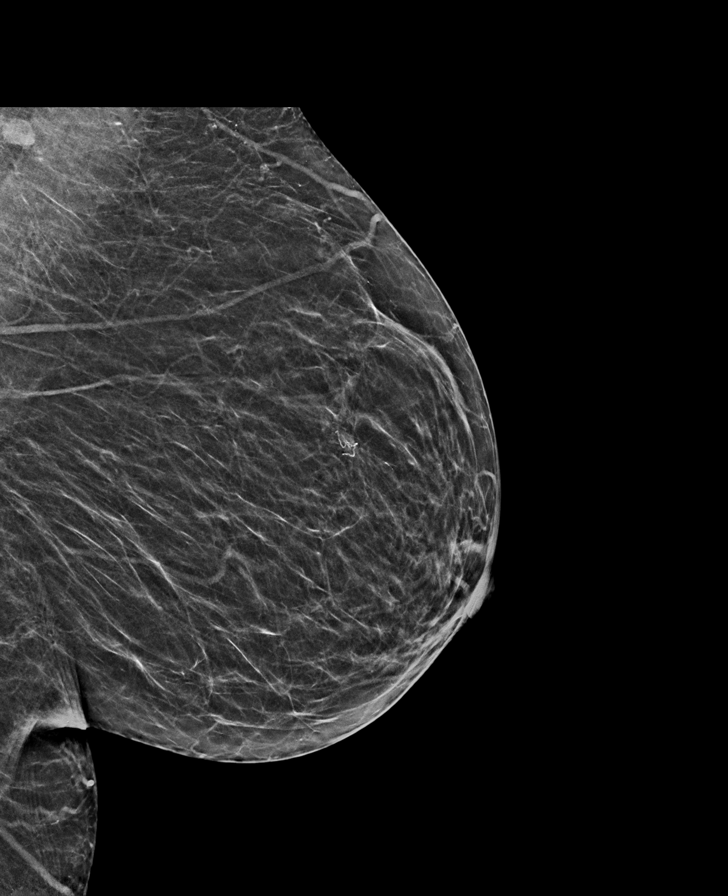

[R MLO synth-2D]
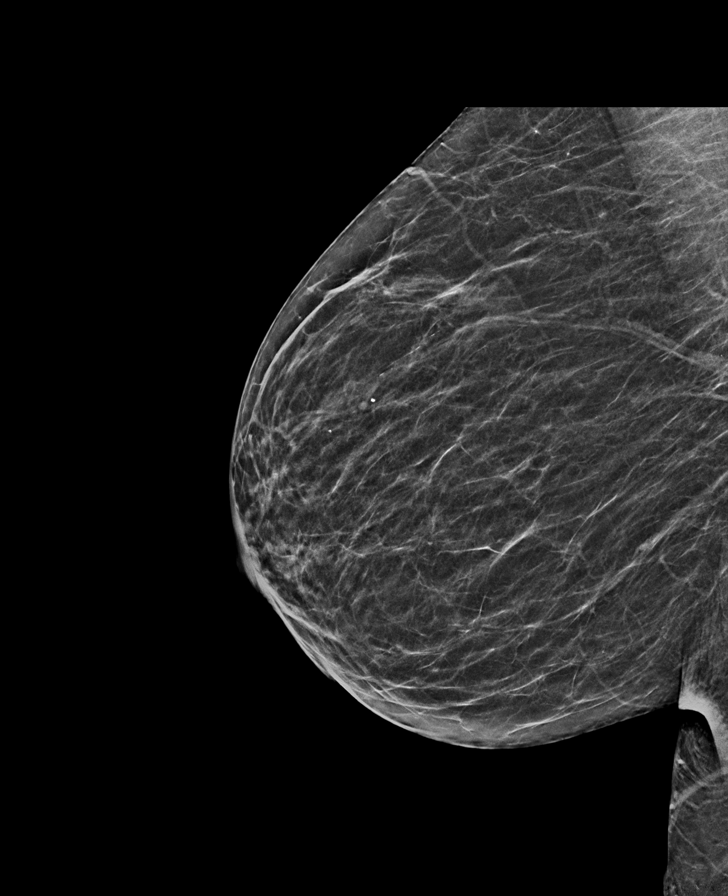

[R MLO tomo · tomo slice 27/52.0]
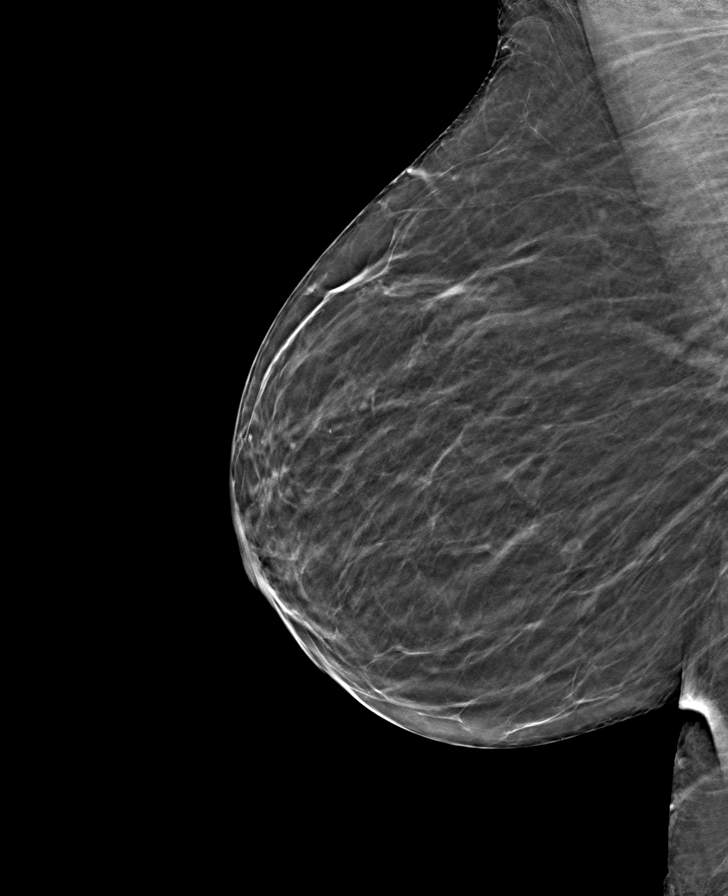

[R CC tomo · tomo slice 24/47.0]
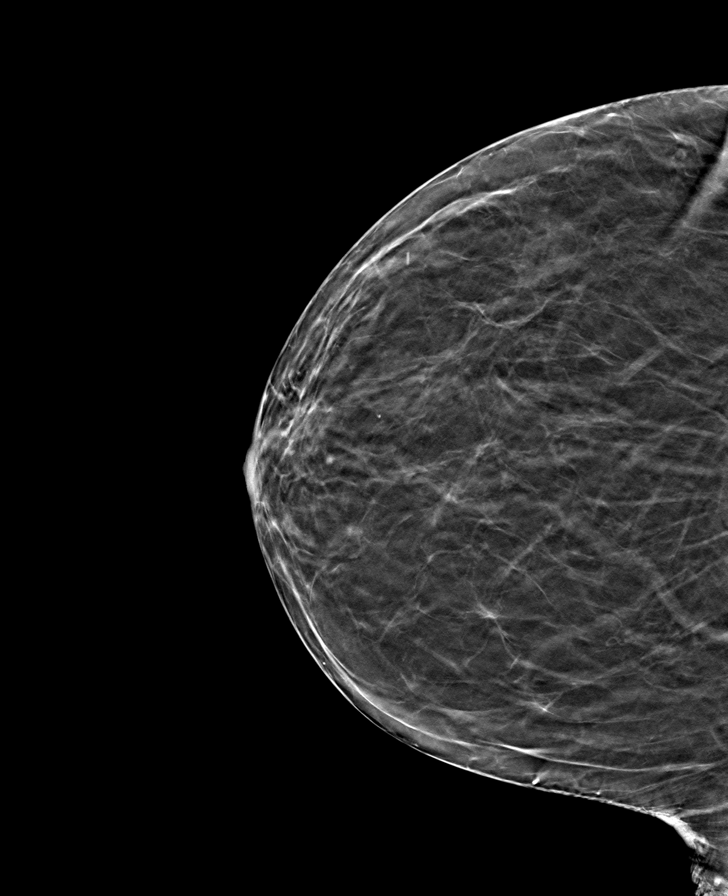

[L CC tomo · tomo slice 25/49.0]
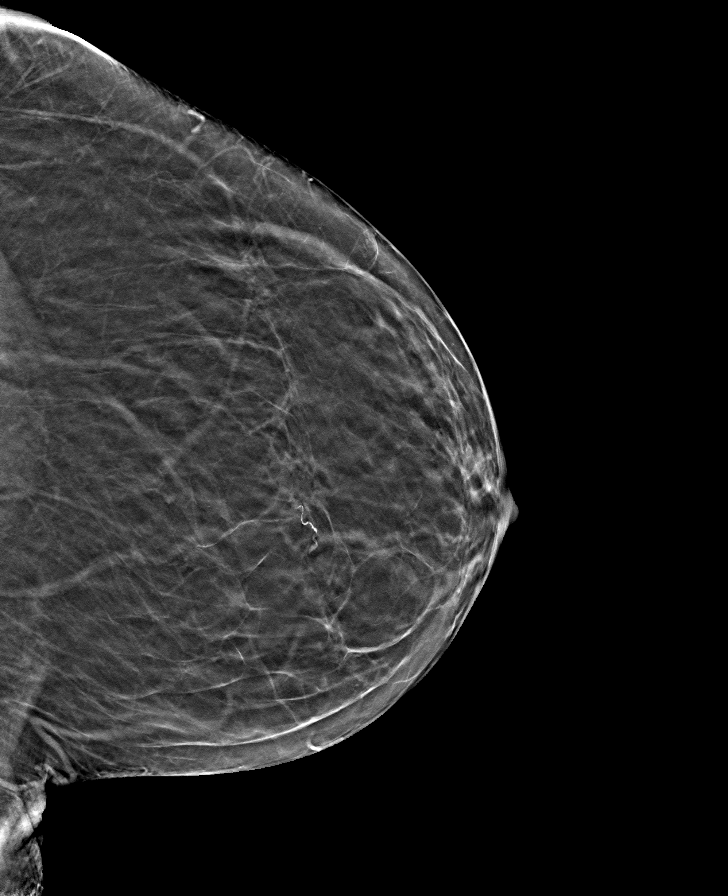

[L MLO tomo · tomo slice 25/50.0]
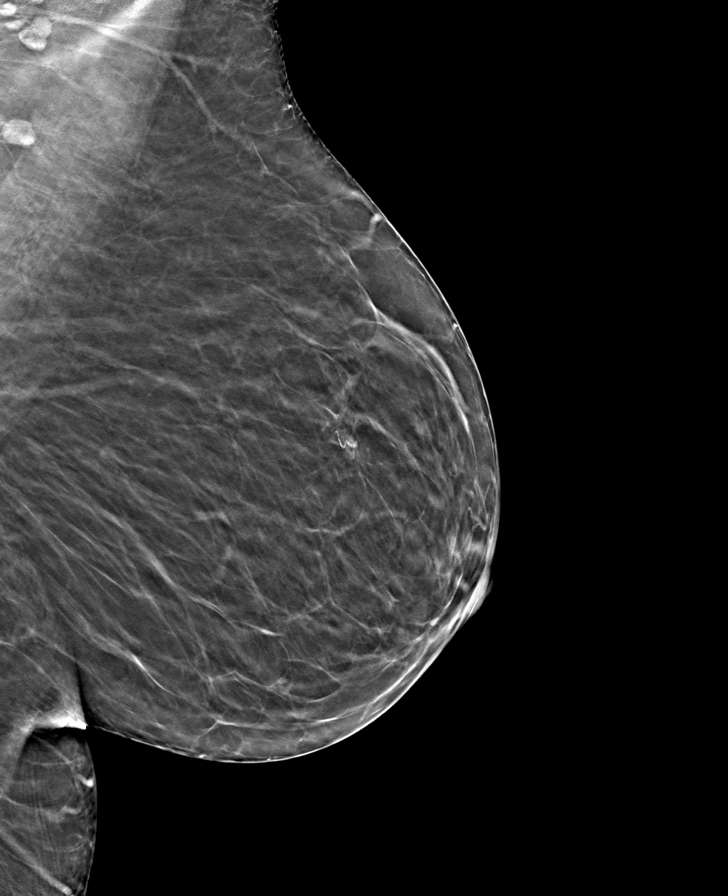

[8 of 24 positions shown; findings below may reference images not displayed]

ACR Breast Density Category b: There are scattered areas of
fibroglandular density.
FINDINGS: There are no findings suspicious for malignancy.
IMPRESSION: No mammographic evidence of malignancy. A result letter of this
screening mammogram will be mailed directly to the patient.

RECOMMENDATION:
Screening mammogram in one year. (Code:51-O-LD2)

BI-RADS CATEGORY  1: Negative.

## 2024-01-06 DIAGNOSIS — N179 Acute kidney failure, unspecified: Secondary | ICD-10-CM | POA: Diagnosis not present

## 2024-01-06 DIAGNOSIS — Z944 Liver transplant status: Secondary | ICD-10-CM | POA: Diagnosis not present

## 2024-01-07 DIAGNOSIS — Z944 Liver transplant status: Secondary | ICD-10-CM | POA: Diagnosis not present

## 2024-01-07 DIAGNOSIS — K7031 Alcoholic cirrhosis of liver with ascites: Secondary | ICD-10-CM | POA: Diagnosis not present

## 2024-01-07 DIAGNOSIS — N179 Acute kidney failure, unspecified: Secondary | ICD-10-CM | POA: Diagnosis not present

## 2024-01-07 DIAGNOSIS — T8649 Other complications of liver transplant: Secondary | ICD-10-CM | POA: Diagnosis not present

## 2024-01-09 DIAGNOSIS — Z992 Dependence on renal dialysis: Secondary | ICD-10-CM | POA: Diagnosis not present

## 2024-01-09 DIAGNOSIS — N179 Acute kidney failure, unspecified: Secondary | ICD-10-CM | POA: Diagnosis not present

## 2024-01-09 DIAGNOSIS — R509 Fever, unspecified: Secondary | ICD-10-CM | POA: Diagnosis not present

## 2024-01-09 DIAGNOSIS — R4182 Altered mental status, unspecified: Secondary | ICD-10-CM | POA: Diagnosis not present

## 2024-01-09 DIAGNOSIS — Z944 Liver transplant status: Secondary | ICD-10-CM | POA: Diagnosis not present

## 2024-01-10 DIAGNOSIS — T8649 Other complications of liver transplant: Secondary | ICD-10-CM | POA: Diagnosis not present

## 2024-01-10 DIAGNOSIS — D84821 Immunodeficiency due to drugs: Secondary | ICD-10-CM | POA: Diagnosis not present

## 2024-01-10 DIAGNOSIS — N179 Acute kidney failure, unspecified: Secondary | ICD-10-CM | POA: Diagnosis not present

## 2024-01-10 DIAGNOSIS — R509 Fever, unspecified: Secondary | ICD-10-CM | POA: Diagnosis not present

## 2024-01-10 DIAGNOSIS — E8729 Other acidosis: Secondary | ICD-10-CM | POA: Diagnosis not present

## 2024-01-10 DIAGNOSIS — J90 Pleural effusion, not elsewhere classified: Secondary | ICD-10-CM | POA: Diagnosis not present

## 2024-01-10 DIAGNOSIS — Z136 Encounter for screening for cardiovascular disorders: Secondary | ICD-10-CM | POA: Diagnosis not present

## 2024-01-10 DIAGNOSIS — Z79899 Other long term (current) drug therapy: Secondary | ICD-10-CM | POA: Diagnosis not present

## 2024-01-10 DIAGNOSIS — Z944 Liver transplant status: Secondary | ICD-10-CM | POA: Diagnosis not present

## 2024-01-10 DIAGNOSIS — R4182 Altered mental status, unspecified: Secondary | ICD-10-CM | POA: Diagnosis not present

## 2024-01-10 DIAGNOSIS — K7031 Alcoholic cirrhosis of liver with ascites: Secondary | ICD-10-CM | POA: Diagnosis not present

## 2024-01-11 DIAGNOSIS — Z4659 Encounter for fitting and adjustment of other gastrointestinal appliance and device: Secondary | ICD-10-CM | POA: Diagnosis not present

## 2024-01-11 DIAGNOSIS — D84821 Immunodeficiency due to drugs: Secondary | ICD-10-CM | POA: Diagnosis not present

## 2024-01-11 DIAGNOSIS — Z79899 Other long term (current) drug therapy: Secondary | ICD-10-CM | POA: Diagnosis not present

## 2024-01-11 DIAGNOSIS — Z944 Liver transplant status: Secondary | ICD-10-CM | POA: Diagnosis not present

## 2024-01-11 DIAGNOSIS — J90 Pleural effusion, not elsewhere classified: Secondary | ICD-10-CM | POA: Diagnosis not present

## 2024-01-11 DIAGNOSIS — K7031 Alcoholic cirrhosis of liver with ascites: Secondary | ICD-10-CM | POA: Diagnosis not present

## 2024-01-11 DIAGNOSIS — R4182 Altered mental status, unspecified: Secondary | ICD-10-CM | POA: Diagnosis not present

## 2024-01-11 DIAGNOSIS — N179 Acute kidney failure, unspecified: Secondary | ICD-10-CM | POA: Diagnosis not present

## 2024-01-11 DIAGNOSIS — R509 Fever, unspecified: Secondary | ICD-10-CM | POA: Diagnosis not present

## 2024-01-11 DIAGNOSIS — E8729 Other acidosis: Secondary | ICD-10-CM | POA: Diagnosis not present

## 2024-01-11 DIAGNOSIS — T8649 Other complications of liver transplant: Secondary | ICD-10-CM | POA: Diagnosis not present

## 2024-01-12 DIAGNOSIS — J90 Pleural effusion, not elsewhere classified: Secondary | ICD-10-CM | POA: Diagnosis not present

## 2024-01-12 DIAGNOSIS — R9431 Abnormal electrocardiogram [ECG] [EKG]: Secondary | ICD-10-CM | POA: Diagnosis not present

## 2024-01-12 DIAGNOSIS — R918 Other nonspecific abnormal finding of lung field: Secondary | ICD-10-CM | POA: Diagnosis not present

## 2024-01-12 DIAGNOSIS — Z944 Liver transplant status: Secondary | ICD-10-CM | POA: Diagnosis not present

## 2024-01-12 DIAGNOSIS — D84821 Immunodeficiency due to drugs: Secondary | ICD-10-CM | POA: Diagnosis not present

## 2024-01-12 DIAGNOSIS — R410 Disorientation, unspecified: Secondary | ICD-10-CM | POA: Diagnosis not present

## 2024-01-12 DIAGNOSIS — Z79899 Other long term (current) drug therapy: Secondary | ICD-10-CM | POA: Diagnosis not present

## 2024-01-12 DIAGNOSIS — R509 Fever, unspecified: Secondary | ICD-10-CM | POA: Diagnosis not present

## 2024-01-12 DIAGNOSIS — Z4682 Encounter for fitting and adjustment of non-vascular catheter: Secondary | ICD-10-CM | POA: Diagnosis not present

## 2024-01-13 DIAGNOSIS — R509 Fever, unspecified: Secondary | ICD-10-CM | POA: Diagnosis not present

## 2024-01-13 DIAGNOSIS — Z4682 Encounter for fitting and adjustment of non-vascular catheter: Secondary | ICD-10-CM | POA: Diagnosis not present

## 2024-01-13 DIAGNOSIS — J90 Pleural effusion, not elsewhere classified: Secondary | ICD-10-CM | POA: Diagnosis not present

## 2024-01-13 DIAGNOSIS — K1379 Other lesions of oral mucosa: Secondary | ICD-10-CM | POA: Diagnosis not present

## 2024-01-13 DIAGNOSIS — Z944 Liver transplant status: Secondary | ICD-10-CM | POA: Diagnosis not present

## 2024-01-14 DIAGNOSIS — J90 Pleural effusion, not elsewhere classified: Secondary | ICD-10-CM | POA: Diagnosis not present

## 2024-01-14 DIAGNOSIS — R001 Bradycardia, unspecified: Secondary | ICD-10-CM | POA: Diagnosis not present

## 2024-01-14 DIAGNOSIS — Z944 Liver transplant status: Secondary | ICD-10-CM | POA: Diagnosis not present

## 2024-01-14 DIAGNOSIS — I4891 Unspecified atrial fibrillation: Secondary | ICD-10-CM | POA: Diagnosis not present

## 2024-01-16 DIAGNOSIS — R55 Syncope and collapse: Secondary | ICD-10-CM | POA: Diagnosis not present

## 2024-01-17 DIAGNOSIS — R569 Unspecified convulsions: Secondary | ICD-10-CM | POA: Diagnosis not present

## 2024-01-17 DIAGNOSIS — K7682 Hepatic encephalopathy: Secondary | ICD-10-CM | POA: Diagnosis not present

## 2024-01-18 DIAGNOSIS — Z136 Encounter for screening for cardiovascular disorders: Secondary | ICD-10-CM | POA: Diagnosis not present

## 2024-01-18 DIAGNOSIS — F4321 Adjustment disorder with depressed mood: Secondary | ICD-10-CM | POA: Diagnosis not present

## 2024-01-18 DIAGNOSIS — I440 Atrioventricular block, first degree: Secondary | ICD-10-CM | POA: Diagnosis not present

## 2024-01-18 DIAGNOSIS — J90 Pleural effusion, not elsewhere classified: Secondary | ICD-10-CM | POA: Diagnosis not present

## 2024-01-19 DIAGNOSIS — N179 Acute kidney failure, unspecified: Secondary | ICD-10-CM | POA: Diagnosis not present

## 2024-01-19 DIAGNOSIS — F419 Anxiety disorder, unspecified: Secondary | ICD-10-CM | POA: Diagnosis not present

## 2024-01-19 DIAGNOSIS — R0989 Other specified symptoms and signs involving the circulatory and respiratory systems: Secondary | ICD-10-CM | POA: Diagnosis not present

## 2024-01-19 DIAGNOSIS — G4700 Insomnia, unspecified: Secondary | ICD-10-CM | POA: Diagnosis not present

## 2024-01-19 DIAGNOSIS — B259 Cytomegaloviral disease, unspecified: Secondary | ICD-10-CM | POA: Diagnosis not present

## 2024-01-19 DIAGNOSIS — Z944 Liver transplant status: Secondary | ICD-10-CM | POA: Diagnosis not present

## 2024-01-19 DIAGNOSIS — D638 Anemia in other chronic diseases classified elsewhere: Secondary | ICD-10-CM | POA: Diagnosis not present

## 2024-01-19 DIAGNOSIS — G8918 Other acute postprocedural pain: Secondary | ICD-10-CM | POA: Diagnosis not present

## 2024-01-20 DIAGNOSIS — N179 Acute kidney failure, unspecified: Secondary | ICD-10-CM | POA: Diagnosis not present

## 2024-01-20 DIAGNOSIS — E039 Hypothyroidism, unspecified: Secondary | ICD-10-CM | POA: Diagnosis not present

## 2024-01-20 DIAGNOSIS — R0989 Other specified symptoms and signs involving the circulatory and respiratory systems: Secondary | ICD-10-CM | POA: Diagnosis not present

## 2024-01-20 DIAGNOSIS — T8649 Other complications of liver transplant: Secondary | ICD-10-CM | POA: Diagnosis not present

## 2024-01-20 DIAGNOSIS — Z944 Liver transplant status: Secondary | ICD-10-CM | POA: Diagnosis not present

## 2024-01-20 DIAGNOSIS — B259 Cytomegaloviral disease, unspecified: Secondary | ICD-10-CM | POA: Diagnosis not present

## 2024-01-20 DIAGNOSIS — K7031 Alcoholic cirrhosis of liver with ascites: Secondary | ICD-10-CM | POA: Diagnosis not present

## 2024-01-20 DIAGNOSIS — F4321 Adjustment disorder with depressed mood: Secondary | ICD-10-CM | POA: Diagnosis not present

## 2024-01-20 DIAGNOSIS — Z950 Presence of cardiac pacemaker: Secondary | ICD-10-CM | POA: Diagnosis not present

## 2024-01-20 DIAGNOSIS — I495 Sick sinus syndrome: Secondary | ICD-10-CM | POA: Diagnosis not present

## 2024-01-21 DIAGNOSIS — I82C11 Acute embolism and thrombosis of right internal jugular vein: Secondary | ICD-10-CM | POA: Diagnosis not present

## 2024-01-22 DIAGNOSIS — R0689 Other abnormalities of breathing: Secondary | ICD-10-CM | POA: Diagnosis not present

## 2024-01-23 DIAGNOSIS — J90 Pleural effusion, not elsewhere classified: Secondary | ICD-10-CM | POA: Diagnosis not present

## 2024-01-23 DIAGNOSIS — Z4682 Encounter for fitting and adjustment of non-vascular catheter: Secondary | ICD-10-CM | POA: Diagnosis not present

## 2024-01-25 DIAGNOSIS — F4321 Adjustment disorder with depressed mood: Secondary | ICD-10-CM | POA: Diagnosis not present

## 2024-01-26 DIAGNOSIS — J90 Pleural effusion, not elsewhere classified: Secondary | ICD-10-CM | POA: Diagnosis not present

## 2024-01-27 DIAGNOSIS — F4321 Adjustment disorder with depressed mood: Secondary | ICD-10-CM | POA: Diagnosis not present

## 2024-01-27 DIAGNOSIS — Z944 Liver transplant status: Secondary | ICD-10-CM | POA: Diagnosis not present

## 2024-01-27 DIAGNOSIS — B259 Cytomegaloviral disease, unspecified: Secondary | ICD-10-CM | POA: Diagnosis not present

## 2024-01-27 DIAGNOSIS — N179 Acute kidney failure, unspecified: Secondary | ICD-10-CM | POA: Diagnosis not present

## 2024-01-30 DIAGNOSIS — N179 Acute kidney failure, unspecified: Secondary | ICD-10-CM | POA: Diagnosis not present

## 2024-01-30 DIAGNOSIS — T8649 Other complications of liver transplant: Secondary | ICD-10-CM | POA: Diagnosis not present

## 2024-01-30 DIAGNOSIS — K7031 Alcoholic cirrhosis of liver with ascites: Secondary | ICD-10-CM | POA: Diagnosis not present

## 2024-01-31 DIAGNOSIS — J90 Pleural effusion, not elsewhere classified: Secondary | ICD-10-CM | POA: Diagnosis not present

## 2024-01-31 DIAGNOSIS — J9601 Acute respiratory failure with hypoxia: Secondary | ICD-10-CM | POA: Diagnosis not present

## 2024-01-31 DIAGNOSIS — I4891 Unspecified atrial fibrillation: Secondary | ICD-10-CM | POA: Diagnosis not present

## 2024-01-31 DIAGNOSIS — I129 Hypertensive chronic kidney disease with stage 1 through stage 4 chronic kidney disease, or unspecified chronic kidney disease: Secondary | ICD-10-CM | POA: Diagnosis not present

## 2024-01-31 DIAGNOSIS — J918 Pleural effusion in other conditions classified elsewhere: Secondary | ICD-10-CM | POA: Diagnosis not present

## 2024-01-31 DIAGNOSIS — B259 Cytomegaloviral disease, unspecified: Secondary | ICD-10-CM | POA: Diagnosis not present

## 2024-01-31 DIAGNOSIS — E875 Hyperkalemia: Secondary | ICD-10-CM | POA: Diagnosis not present

## 2024-01-31 DIAGNOSIS — Z45018 Encounter for adjustment and management of other part of cardiac pacemaker: Secondary | ICD-10-CM | POA: Diagnosis not present

## 2024-01-31 DIAGNOSIS — T50Z94A Poisoning by other vaccines and biological substances, undetermined, initial encounter: Secondary | ICD-10-CM | POA: Diagnosis not present

## 2024-01-31 DIAGNOSIS — R0600 Dyspnea, unspecified: Secondary | ICD-10-CM | POA: Diagnosis not present

## 2024-01-31 DIAGNOSIS — Z79890 Hormone replacement therapy (postmenopausal): Secondary | ICD-10-CM | POA: Diagnosis not present

## 2024-01-31 DIAGNOSIS — R062 Wheezing: Secondary | ICD-10-CM | POA: Diagnosis not present

## 2024-01-31 DIAGNOSIS — J449 Chronic obstructive pulmonary disease, unspecified: Secondary | ICD-10-CM | POA: Diagnosis not present

## 2024-01-31 DIAGNOSIS — Z950 Presence of cardiac pacemaker: Secondary | ICD-10-CM | POA: Diagnosis not present

## 2024-01-31 DIAGNOSIS — Z944 Liver transplant status: Secondary | ICD-10-CM | POA: Diagnosis not present

## 2024-01-31 DIAGNOSIS — N189 Chronic kidney disease, unspecified: Secondary | ICD-10-CM | POA: Diagnosis not present

## 2024-01-31 DIAGNOSIS — R001 Bradycardia, unspecified: Secondary | ICD-10-CM | POA: Diagnosis not present

## 2024-01-31 DIAGNOSIS — R0902 Hypoxemia: Secondary | ICD-10-CM | POA: Diagnosis not present

## 2024-01-31 DIAGNOSIS — Z7901 Long term (current) use of anticoagulants: Secondary | ICD-10-CM | POA: Diagnosis not present

## 2024-01-31 DIAGNOSIS — Z90711 Acquired absence of uterus with remaining cervical stump: Secondary | ICD-10-CM | POA: Diagnosis not present

## 2024-01-31 DIAGNOSIS — R0602 Shortness of breath: Secondary | ICD-10-CM | POA: Diagnosis not present

## 2024-01-31 DIAGNOSIS — D849 Immunodeficiency, unspecified: Secondary | ICD-10-CM | POA: Diagnosis not present

## 2024-01-31 DIAGNOSIS — I82C11 Acute embolism and thrombosis of right internal jugular vein: Secondary | ICD-10-CM | POA: Diagnosis not present

## 2024-01-31 DIAGNOSIS — F419 Anxiety disorder, unspecified: Secondary | ICD-10-CM | POA: Diagnosis not present

## 2024-01-31 DIAGNOSIS — Z9049 Acquired absence of other specified parts of digestive tract: Secondary | ICD-10-CM | POA: Diagnosis not present

## 2024-01-31 DIAGNOSIS — R1084 Generalized abdominal pain: Secondary | ICD-10-CM | POA: Diagnosis not present

## 2024-01-31 DIAGNOSIS — Z87891 Personal history of nicotine dependence: Secondary | ICD-10-CM | POA: Diagnosis not present

## 2024-01-31 DIAGNOSIS — J948 Other specified pleural conditions: Secondary | ICD-10-CM | POA: Diagnosis not present

## 2024-01-31 DIAGNOSIS — E039 Hypothyroidism, unspecified: Secondary | ICD-10-CM | POA: Diagnosis not present

## 2024-02-06 DIAGNOSIS — Z944 Liver transplant status: Secondary | ICD-10-CM | POA: Diagnosis not present

## 2024-02-06 DIAGNOSIS — D84821 Immunodeficiency due to drugs: Secondary | ICD-10-CM | POA: Diagnosis not present

## 2024-02-06 DIAGNOSIS — R531 Weakness: Secondary | ICD-10-CM | POA: Diagnosis not present

## 2024-02-06 DIAGNOSIS — Z79899 Other long term (current) drug therapy: Secondary | ICD-10-CM | POA: Diagnosis not present

## 2024-02-06 DIAGNOSIS — D849 Immunodeficiency, unspecified: Secondary | ICD-10-CM | POA: Diagnosis not present

## 2024-02-06 DIAGNOSIS — R0602 Shortness of breath: Secondary | ICD-10-CM | POA: Diagnosis not present

## 2024-02-06 DIAGNOSIS — F419 Anxiety disorder, unspecified: Secondary | ICD-10-CM | POA: Diagnosis not present

## 2024-02-06 DIAGNOSIS — Z48298 Encounter for aftercare following other organ transplant: Secondary | ICD-10-CM | POA: Diagnosis not present

## 2024-02-06 DIAGNOSIS — Z4823 Encounter for aftercare following liver transplant: Secondary | ICD-10-CM | POA: Diagnosis not present

## 2024-02-08 DIAGNOSIS — M545 Low back pain, unspecified: Secondary | ICD-10-CM | POA: Diagnosis not present

## 2024-02-08 DIAGNOSIS — R5381 Other malaise: Secondary | ICD-10-CM | POA: Diagnosis not present

## 2024-02-08 DIAGNOSIS — Z515 Encounter for palliative care: Secondary | ICD-10-CM | POA: Diagnosis not present

## 2024-02-08 DIAGNOSIS — F413 Other mixed anxiety disorders: Secondary | ICD-10-CM | POA: Diagnosis not present

## 2024-02-08 DIAGNOSIS — Z944 Liver transplant status: Secondary | ICD-10-CM | POA: Diagnosis not present

## 2024-02-08 DIAGNOSIS — G479 Sleep disorder, unspecified: Secondary | ICD-10-CM | POA: Diagnosis not present

## 2024-02-08 DIAGNOSIS — Z7189 Other specified counseling: Secondary | ICD-10-CM | POA: Diagnosis not present

## 2024-02-13 DIAGNOSIS — R531 Weakness: Secondary | ICD-10-CM | POA: Diagnosis not present

## 2024-02-13 DIAGNOSIS — D84821 Immunodeficiency due to drugs: Secondary | ICD-10-CM | POA: Diagnosis not present

## 2024-02-13 DIAGNOSIS — J918 Pleural effusion in other conditions classified elsewhere: Secondary | ICD-10-CM | POA: Diagnosis not present

## 2024-02-13 DIAGNOSIS — Z48298 Encounter for aftercare following other organ transplant: Secondary | ICD-10-CM | POA: Diagnosis not present

## 2024-02-13 DIAGNOSIS — Z79899 Other long term (current) drug therapy: Secondary | ICD-10-CM | POA: Diagnosis not present

## 2024-02-13 DIAGNOSIS — J90 Pleural effusion, not elsewhere classified: Secondary | ICD-10-CM | POA: Diagnosis not present

## 2024-02-13 DIAGNOSIS — R0602 Shortness of breath: Secondary | ICD-10-CM | POA: Diagnosis not present

## 2024-02-13 DIAGNOSIS — Z4823 Encounter for aftercare following liver transplant: Secondary | ICD-10-CM | POA: Diagnosis not present

## 2024-02-13 DIAGNOSIS — F419 Anxiety disorder, unspecified: Secondary | ICD-10-CM | POA: Diagnosis not present

## 2024-02-13 DIAGNOSIS — K769 Liver disease, unspecified: Secondary | ICD-10-CM | POA: Diagnosis not present

## 2024-02-13 DIAGNOSIS — Z944 Liver transplant status: Secondary | ICD-10-CM | POA: Diagnosis not present

## 2024-02-13 DIAGNOSIS — D849 Immunodeficiency, unspecified: Secondary | ICD-10-CM | POA: Diagnosis not present

## 2024-02-16 DIAGNOSIS — I8500 Esophageal varices without bleeding: Secondary | ICD-10-CM | POA: Diagnosis not present

## 2024-02-16 DIAGNOSIS — Z4823 Encounter for aftercare following liver transplant: Secondary | ICD-10-CM | POA: Diagnosis not present

## 2024-02-16 DIAGNOSIS — E440 Moderate protein-calorie malnutrition: Secondary | ICD-10-CM | POA: Diagnosis not present

## 2024-02-16 DIAGNOSIS — N179 Acute kidney failure, unspecified: Secondary | ICD-10-CM | POA: Diagnosis not present

## 2024-02-16 DIAGNOSIS — E872 Acidosis, unspecified: Secondary | ICD-10-CM | POA: Diagnosis not present

## 2024-02-16 DIAGNOSIS — F419 Anxiety disorder, unspecified: Secondary | ICD-10-CM | POA: Diagnosis not present

## 2024-02-16 DIAGNOSIS — N189 Chronic kidney disease, unspecified: Secondary | ICD-10-CM | POA: Diagnosis not present

## 2024-02-16 DIAGNOSIS — I129 Hypertensive chronic kidney disease with stage 1 through stage 4 chronic kidney disease, or unspecified chronic kidney disease: Secondary | ICD-10-CM | POA: Diagnosis not present

## 2024-02-16 DIAGNOSIS — J449 Chronic obstructive pulmonary disease, unspecified: Secondary | ICD-10-CM | POA: Diagnosis not present

## 2024-02-16 DIAGNOSIS — B182 Chronic viral hepatitis C: Secondary | ICD-10-CM | POA: Diagnosis not present

## 2024-02-20 DIAGNOSIS — Z79899 Other long term (current) drug therapy: Secondary | ICD-10-CM | POA: Diagnosis not present

## 2024-02-20 DIAGNOSIS — B182 Chronic viral hepatitis C: Secondary | ICD-10-CM | POA: Diagnosis not present

## 2024-02-20 DIAGNOSIS — N189 Chronic kidney disease, unspecified: Secondary | ICD-10-CM | POA: Diagnosis not present

## 2024-02-20 DIAGNOSIS — Z4823 Encounter for aftercare following liver transplant: Secondary | ICD-10-CM | POA: Diagnosis not present

## 2024-02-20 DIAGNOSIS — F419 Anxiety disorder, unspecified: Secondary | ICD-10-CM | POA: Diagnosis not present

## 2024-02-20 DIAGNOSIS — J449 Chronic obstructive pulmonary disease, unspecified: Secondary | ICD-10-CM | POA: Diagnosis not present

## 2024-02-20 DIAGNOSIS — E872 Acidosis, unspecified: Secondary | ICD-10-CM | POA: Diagnosis not present

## 2024-02-20 DIAGNOSIS — I8500 Esophageal varices without bleeding: Secondary | ICD-10-CM | POA: Diagnosis not present

## 2024-02-20 DIAGNOSIS — N179 Acute kidney failure, unspecified: Secondary | ICD-10-CM | POA: Diagnosis not present

## 2024-02-20 DIAGNOSIS — I129 Hypertensive chronic kidney disease with stage 1 through stage 4 chronic kidney disease, or unspecified chronic kidney disease: Secondary | ICD-10-CM | POA: Diagnosis not present

## 2024-02-20 DIAGNOSIS — E440 Moderate protein-calorie malnutrition: Secondary | ICD-10-CM | POA: Diagnosis not present

## 2024-02-20 DIAGNOSIS — Z944 Liver transplant status: Secondary | ICD-10-CM | POA: Diagnosis not present

## 2024-02-21 DIAGNOSIS — I8500 Esophageal varices without bleeding: Secondary | ICD-10-CM | POA: Diagnosis not present

## 2024-02-21 DIAGNOSIS — N189 Chronic kidney disease, unspecified: Secondary | ICD-10-CM | POA: Diagnosis not present

## 2024-02-21 DIAGNOSIS — B182 Chronic viral hepatitis C: Secondary | ICD-10-CM | POA: Diagnosis not present

## 2024-02-21 DIAGNOSIS — I129 Hypertensive chronic kidney disease with stage 1 through stage 4 chronic kidney disease, or unspecified chronic kidney disease: Secondary | ICD-10-CM | POA: Diagnosis not present

## 2024-02-21 DIAGNOSIS — Z4823 Encounter for aftercare following liver transplant: Secondary | ICD-10-CM | POA: Diagnosis not present

## 2024-02-21 DIAGNOSIS — J449 Chronic obstructive pulmonary disease, unspecified: Secondary | ICD-10-CM | POA: Diagnosis not present

## 2024-02-21 DIAGNOSIS — E440 Moderate protein-calorie malnutrition: Secondary | ICD-10-CM | POA: Diagnosis not present

## 2024-02-21 DIAGNOSIS — N179 Acute kidney failure, unspecified: Secondary | ICD-10-CM | POA: Diagnosis not present

## 2024-02-21 DIAGNOSIS — F419 Anxiety disorder, unspecified: Secondary | ICD-10-CM | POA: Diagnosis not present

## 2024-02-21 DIAGNOSIS — E872 Acidosis, unspecified: Secondary | ICD-10-CM | POA: Diagnosis not present

## 2024-02-22 DIAGNOSIS — I8500 Esophageal varices without bleeding: Secondary | ICD-10-CM | POA: Diagnosis not present

## 2024-02-22 DIAGNOSIS — B182 Chronic viral hepatitis C: Secondary | ICD-10-CM | POA: Diagnosis not present

## 2024-02-22 DIAGNOSIS — E440 Moderate protein-calorie malnutrition: Secondary | ICD-10-CM | POA: Diagnosis not present

## 2024-02-22 DIAGNOSIS — F419 Anxiety disorder, unspecified: Secondary | ICD-10-CM | POA: Diagnosis not present

## 2024-02-22 DIAGNOSIS — E872 Acidosis, unspecified: Secondary | ICD-10-CM | POA: Diagnosis not present

## 2024-02-22 DIAGNOSIS — N179 Acute kidney failure, unspecified: Secondary | ICD-10-CM | POA: Diagnosis not present

## 2024-02-22 DIAGNOSIS — Z4823 Encounter for aftercare following liver transplant: Secondary | ICD-10-CM | POA: Diagnosis not present

## 2024-02-22 DIAGNOSIS — I129 Hypertensive chronic kidney disease with stage 1 through stage 4 chronic kidney disease, or unspecified chronic kidney disease: Secondary | ICD-10-CM | POA: Diagnosis not present

## 2024-02-22 DIAGNOSIS — N189 Chronic kidney disease, unspecified: Secondary | ICD-10-CM | POA: Diagnosis not present

## 2024-02-22 DIAGNOSIS — J449 Chronic obstructive pulmonary disease, unspecified: Secondary | ICD-10-CM | POA: Diagnosis not present

## 2024-02-27 DIAGNOSIS — E440 Moderate protein-calorie malnutrition: Secondary | ICD-10-CM | POA: Diagnosis not present

## 2024-02-27 DIAGNOSIS — E872 Acidosis, unspecified: Secondary | ICD-10-CM | POA: Diagnosis not present

## 2024-02-27 DIAGNOSIS — Z4823 Encounter for aftercare following liver transplant: Secondary | ICD-10-CM | POA: Diagnosis not present

## 2024-02-27 DIAGNOSIS — N179 Acute kidney failure, unspecified: Secondary | ICD-10-CM | POA: Diagnosis not present

## 2024-02-27 DIAGNOSIS — Z79899 Other long term (current) drug therapy: Secondary | ICD-10-CM | POA: Diagnosis not present

## 2024-02-27 DIAGNOSIS — B182 Chronic viral hepatitis C: Secondary | ICD-10-CM | POA: Diagnosis not present

## 2024-02-27 DIAGNOSIS — I129 Hypertensive chronic kidney disease with stage 1 through stage 4 chronic kidney disease, or unspecified chronic kidney disease: Secondary | ICD-10-CM | POA: Diagnosis not present

## 2024-02-27 DIAGNOSIS — N189 Chronic kidney disease, unspecified: Secondary | ICD-10-CM | POA: Diagnosis not present

## 2024-02-27 DIAGNOSIS — Z944 Liver transplant status: Secondary | ICD-10-CM | POA: Diagnosis not present

## 2024-02-27 DIAGNOSIS — I8500 Esophageal varices without bleeding: Secondary | ICD-10-CM | POA: Diagnosis not present

## 2024-02-27 DIAGNOSIS — F419 Anxiety disorder, unspecified: Secondary | ICD-10-CM | POA: Diagnosis not present

## 2024-02-27 DIAGNOSIS — J449 Chronic obstructive pulmonary disease, unspecified: Secondary | ICD-10-CM | POA: Diagnosis not present

## 2024-03-01 DIAGNOSIS — G479 Sleep disorder, unspecified: Secondary | ICD-10-CM | POA: Diagnosis not present

## 2024-03-01 DIAGNOSIS — E440 Moderate protein-calorie malnutrition: Secondary | ICD-10-CM | POA: Diagnosis not present

## 2024-03-01 DIAGNOSIS — N179 Acute kidney failure, unspecified: Secondary | ICD-10-CM | POA: Diagnosis not present

## 2024-03-01 DIAGNOSIS — N189 Chronic kidney disease, unspecified: Secondary | ICD-10-CM | POA: Diagnosis not present

## 2024-03-01 DIAGNOSIS — R5381 Other malaise: Secondary | ICD-10-CM | POA: Diagnosis not present

## 2024-03-01 DIAGNOSIS — I8500 Esophageal varices without bleeding: Secondary | ICD-10-CM | POA: Diagnosis not present

## 2024-03-01 DIAGNOSIS — I129 Hypertensive chronic kidney disease with stage 1 through stage 4 chronic kidney disease, or unspecified chronic kidney disease: Secondary | ICD-10-CM | POA: Diagnosis not present

## 2024-03-01 DIAGNOSIS — F419 Anxiety disorder, unspecified: Secondary | ICD-10-CM | POA: Diagnosis not present

## 2024-03-01 DIAGNOSIS — M545 Low back pain, unspecified: Secondary | ICD-10-CM | POA: Diagnosis not present

## 2024-03-01 DIAGNOSIS — Z7189 Other specified counseling: Secondary | ICD-10-CM | POA: Diagnosis not present

## 2024-03-01 DIAGNOSIS — Z515 Encounter for palliative care: Secondary | ICD-10-CM | POA: Diagnosis not present

## 2024-03-01 DIAGNOSIS — B182 Chronic viral hepatitis C: Secondary | ICD-10-CM | POA: Diagnosis not present

## 2024-03-01 DIAGNOSIS — Z4823 Encounter for aftercare following liver transplant: Secondary | ICD-10-CM | POA: Diagnosis not present

## 2024-03-01 DIAGNOSIS — Z944 Liver transplant status: Secondary | ICD-10-CM | POA: Diagnosis not present

## 2024-03-01 DIAGNOSIS — F413 Other mixed anxiety disorders: Secondary | ICD-10-CM | POA: Diagnosis not present

## 2024-03-01 DIAGNOSIS — E872 Acidosis, unspecified: Secondary | ICD-10-CM | POA: Diagnosis not present

## 2024-03-01 DIAGNOSIS — J449 Chronic obstructive pulmonary disease, unspecified: Secondary | ICD-10-CM | POA: Diagnosis not present

## 2024-03-04 DIAGNOSIS — N189 Chronic kidney disease, unspecified: Secondary | ICD-10-CM | POA: Diagnosis not present

## 2024-03-04 DIAGNOSIS — J90 Pleural effusion, not elsewhere classified: Secondary | ICD-10-CM | POA: Diagnosis not present

## 2024-03-04 DIAGNOSIS — T50Z94A Poisoning by other vaccines and biological substances, undetermined, initial encounter: Secondary | ICD-10-CM | POA: Diagnosis not present

## 2024-03-04 DIAGNOSIS — R0902 Hypoxemia: Secondary | ICD-10-CM | POA: Diagnosis not present

## 2024-03-05 DIAGNOSIS — I129 Hypertensive chronic kidney disease with stage 1 through stage 4 chronic kidney disease, or unspecified chronic kidney disease: Secondary | ICD-10-CM | POA: Diagnosis not present

## 2024-03-05 DIAGNOSIS — E440 Moderate protein-calorie malnutrition: Secondary | ICD-10-CM | POA: Diagnosis not present

## 2024-03-05 DIAGNOSIS — J449 Chronic obstructive pulmonary disease, unspecified: Secondary | ICD-10-CM | POA: Diagnosis not present

## 2024-03-05 DIAGNOSIS — Z944 Liver transplant status: Secondary | ICD-10-CM | POA: Diagnosis not present

## 2024-03-05 DIAGNOSIS — Z4823 Encounter for aftercare following liver transplant: Secondary | ICD-10-CM | POA: Diagnosis not present

## 2024-03-05 DIAGNOSIS — Z79899 Other long term (current) drug therapy: Secondary | ICD-10-CM | POA: Diagnosis not present

## 2024-03-05 DIAGNOSIS — F419 Anxiety disorder, unspecified: Secondary | ICD-10-CM | POA: Diagnosis not present

## 2024-03-05 DIAGNOSIS — E872 Acidosis, unspecified: Secondary | ICD-10-CM | POA: Diagnosis not present

## 2024-03-05 DIAGNOSIS — N189 Chronic kidney disease, unspecified: Secondary | ICD-10-CM | POA: Diagnosis not present

## 2024-03-05 DIAGNOSIS — B182 Chronic viral hepatitis C: Secondary | ICD-10-CM | POA: Diagnosis not present

## 2024-03-05 DIAGNOSIS — N179 Acute kidney failure, unspecified: Secondary | ICD-10-CM | POA: Diagnosis not present

## 2024-03-05 DIAGNOSIS — I8500 Esophageal varices without bleeding: Secondary | ICD-10-CM | POA: Diagnosis not present

## 2024-03-08 DIAGNOSIS — J449 Chronic obstructive pulmonary disease, unspecified: Secondary | ICD-10-CM | POA: Diagnosis not present

## 2024-03-08 DIAGNOSIS — N179 Acute kidney failure, unspecified: Secondary | ICD-10-CM | POA: Diagnosis not present

## 2024-03-08 DIAGNOSIS — E440 Moderate protein-calorie malnutrition: Secondary | ICD-10-CM | POA: Diagnosis not present

## 2024-03-08 DIAGNOSIS — Z4823 Encounter for aftercare following liver transplant: Secondary | ICD-10-CM | POA: Diagnosis not present

## 2024-03-08 DIAGNOSIS — I8500 Esophageal varices without bleeding: Secondary | ICD-10-CM | POA: Diagnosis not present

## 2024-03-08 DIAGNOSIS — N189 Chronic kidney disease, unspecified: Secondary | ICD-10-CM | POA: Diagnosis not present

## 2024-03-08 DIAGNOSIS — B182 Chronic viral hepatitis C: Secondary | ICD-10-CM | POA: Diagnosis not present

## 2024-03-08 DIAGNOSIS — F419 Anxiety disorder, unspecified: Secondary | ICD-10-CM | POA: Diagnosis not present

## 2024-03-08 DIAGNOSIS — E872 Acidosis, unspecified: Secondary | ICD-10-CM | POA: Diagnosis not present

## 2024-03-08 DIAGNOSIS — I129 Hypertensive chronic kidney disease with stage 1 through stage 4 chronic kidney disease, or unspecified chronic kidney disease: Secondary | ICD-10-CM | POA: Diagnosis not present

## 2024-03-12 DIAGNOSIS — Z515 Encounter for palliative care: Secondary | ICD-10-CM | POA: Diagnosis not present

## 2024-03-12 DIAGNOSIS — Z944 Liver transplant status: Secondary | ICD-10-CM | POA: Diagnosis not present

## 2024-03-12 DIAGNOSIS — R531 Weakness: Secondary | ICD-10-CM | POA: Diagnosis not present

## 2024-03-12 DIAGNOSIS — D84821 Immunodeficiency due to drugs: Secondary | ICD-10-CM | POA: Diagnosis not present

## 2024-03-12 DIAGNOSIS — Z7189 Other specified counseling: Secondary | ICD-10-CM | POA: Diagnosis not present

## 2024-03-12 DIAGNOSIS — Z79899 Other long term (current) drug therapy: Secondary | ICD-10-CM | POA: Diagnosis not present

## 2024-03-12 DIAGNOSIS — R0602 Shortness of breath: Secondary | ICD-10-CM | POA: Diagnosis not present

## 2024-03-12 DIAGNOSIS — F419 Anxiety disorder, unspecified: Secondary | ICD-10-CM | POA: Diagnosis not present

## 2024-03-12 DIAGNOSIS — Z48298 Encounter for aftercare following other organ transplant: Secondary | ICD-10-CM | POA: Diagnosis not present

## 2024-03-12 DIAGNOSIS — K769 Liver disease, unspecified: Secondary | ICD-10-CM | POA: Diagnosis not present

## 2024-03-12 DIAGNOSIS — C220 Liver cell carcinoma: Secondary | ICD-10-CM | POA: Diagnosis not present

## 2024-03-12 DIAGNOSIS — D849 Immunodeficiency, unspecified: Secondary | ICD-10-CM | POA: Diagnosis not present

## 2024-03-12 DIAGNOSIS — J918 Pleural effusion in other conditions classified elsewhere: Secondary | ICD-10-CM | POA: Diagnosis not present

## 2024-03-12 DIAGNOSIS — Z4823 Encounter for aftercare following liver transplant: Secondary | ICD-10-CM | POA: Diagnosis not present

## 2024-03-19 DIAGNOSIS — Z01818 Encounter for other preprocedural examination: Secondary | ICD-10-CM | POA: Diagnosis not present

## 2024-03-22 DIAGNOSIS — Z4823 Encounter for aftercare following liver transplant: Secondary | ICD-10-CM | POA: Diagnosis not present

## 2024-03-22 DIAGNOSIS — Z944 Liver transplant status: Secondary | ICD-10-CM | POA: Diagnosis not present

## 2024-03-22 DIAGNOSIS — Z79899 Other long term (current) drug therapy: Secondary | ICD-10-CM | POA: Diagnosis not present

## 2024-03-26 ENCOUNTER — Telehealth: Payer: Self-pay

## 2024-03-26 NOTE — Telephone Encounter (Signed)
 [facility] pharmacy documentation on your desk in yellow regarding the pt's Pantoprazole . ( A Prescriber response form)

## 2024-03-27 NOTE — Telephone Encounter (Signed)
 Noted. Was FYI from pharmacy regarding considering decreased BID dosing PPI.

## 2024-04-04 DIAGNOSIS — J90 Pleural effusion, not elsewhere classified: Secondary | ICD-10-CM | POA: Diagnosis not present

## 2024-04-04 DIAGNOSIS — R0902 Hypoxemia: Secondary | ICD-10-CM | POA: Diagnosis not present

## 2024-04-04 DIAGNOSIS — T50Z94A Poisoning by other vaccines and biological substances, undetermined, initial encounter: Secondary | ICD-10-CM | POA: Diagnosis not present

## 2024-04-04 DIAGNOSIS — N189 Chronic kidney disease, unspecified: Secondary | ICD-10-CM | POA: Diagnosis not present

## 2024-04-05 DIAGNOSIS — Z4823 Encounter for aftercare following liver transplant: Secondary | ICD-10-CM | POA: Diagnosis not present

## 2024-04-05 DIAGNOSIS — Z944 Liver transplant status: Secondary | ICD-10-CM | POA: Diagnosis not present

## 2024-04-05 DIAGNOSIS — Z79899 Other long term (current) drug therapy: Secondary | ICD-10-CM | POA: Diagnosis not present

## 2024-04-10 NOTE — Telephone Encounter (Signed)
 [facility] calling for order. please fax order to both numbers [phone] or [phone]. they have not received the order.

## 2024-04-11 NOTE — Telephone Encounter (Signed)
 Called patient to give her an update She did not answer/ left a VM with call back if needed  US  order faxed to [facility] yesterday Insurance requires a prior auth for imaging Per Duwaine Cope: MD Raina needs to finish/ sign off on note before PA can be initiated  Have reached out to provider to make him aware

## 2024-04-11 NOTE — Telephone Encounter (Signed)
 Patient is asking for the order to be faxed as soon as possible. [facility] calling for order. please fax order to both numbers [phone] or [phone]. they have not received the order.

## 2024-04-13 DIAGNOSIS — M797 Fibromyalgia: Secondary | ICD-10-CM | POA: Diagnosis not present

## 2024-04-13 DIAGNOSIS — E860 Dehydration: Secondary | ICD-10-CM | POA: Diagnosis not present

## 2024-04-13 DIAGNOSIS — Z944 Liver transplant status: Secondary | ICD-10-CM | POA: Diagnosis not present

## 2024-04-13 DIAGNOSIS — I480 Paroxysmal atrial fibrillation: Secondary | ICD-10-CM | POA: Diagnosis not present

## 2024-04-13 DIAGNOSIS — R42 Dizziness and giddiness: Secondary | ICD-10-CM | POA: Diagnosis not present

## 2024-04-13 DIAGNOSIS — N179 Acute kidney failure, unspecified: Secondary | ICD-10-CM | POA: Diagnosis not present

## 2024-04-13 DIAGNOSIS — B9710 Unspecified enterovirus as the cause of diseases classified elsewhere: Secondary | ICD-10-CM | POA: Diagnosis not present

## 2024-04-13 DIAGNOSIS — J449 Chronic obstructive pulmonary disease, unspecified: Secondary | ICD-10-CM | POA: Diagnosis not present

## 2024-04-13 DIAGNOSIS — K219 Gastro-esophageal reflux disease without esophagitis: Secondary | ICD-10-CM | POA: Diagnosis not present

## 2024-04-13 DIAGNOSIS — E875 Hyperkalemia: Secondary | ICD-10-CM | POA: Diagnosis not present

## 2024-04-13 DIAGNOSIS — Z7901 Long term (current) use of anticoagulants: Secondary | ICD-10-CM | POA: Diagnosis not present

## 2024-04-13 DIAGNOSIS — Z20822 Contact with and (suspected) exposure to covid-19: Secondary | ICD-10-CM | POA: Diagnosis not present

## 2024-04-13 DIAGNOSIS — E039 Hypothyroidism, unspecified: Secondary | ICD-10-CM | POA: Diagnosis not present

## 2024-04-13 DIAGNOSIS — N183 Chronic kidney disease, stage 3 unspecified: Secondary | ICD-10-CM | POA: Diagnosis not present

## 2024-04-13 DIAGNOSIS — Z950 Presence of cardiac pacemaker: Secondary | ICD-10-CM | POA: Diagnosis not present

## 2024-04-13 DIAGNOSIS — K580 Irritable bowel syndrome with diarrhea: Secondary | ICD-10-CM | POA: Diagnosis not present

## 2024-04-13 DIAGNOSIS — R935 Abnormal findings on diagnostic imaging of other abdominal regions, including retroperitoneum: Secondary | ICD-10-CM | POA: Diagnosis not present

## 2024-04-13 DIAGNOSIS — R0602 Shortness of breath: Secondary | ICD-10-CM | POA: Diagnosis not present

## 2024-04-13 DIAGNOSIS — E861 Hypovolemia: Secondary | ICD-10-CM | POA: Diagnosis not present

## 2024-04-13 DIAGNOSIS — D696 Thrombocytopenia, unspecified: Secondary | ICD-10-CM | POA: Diagnosis not present

## 2024-04-13 DIAGNOSIS — I129 Hypertensive chronic kidney disease with stage 1 through stage 4 chronic kidney disease, or unspecified chronic kidney disease: Secondary | ICD-10-CM | POA: Diagnosis not present

## 2024-04-13 DIAGNOSIS — J90 Pleural effusion, not elsewhere classified: Secondary | ICD-10-CM | POA: Diagnosis not present

## 2024-04-13 DIAGNOSIS — J069 Acute upper respiratory infection, unspecified: Secondary | ICD-10-CM | POA: Diagnosis not present

## 2024-04-13 DIAGNOSIS — D84821 Immunodeficiency due to drugs: Secondary | ICD-10-CM | POA: Diagnosis not present

## 2024-04-13 DIAGNOSIS — D849 Immunodeficiency, unspecified: Secondary | ICD-10-CM | POA: Diagnosis not present

## 2024-04-13 DIAGNOSIS — B9789 Other viral agents as the cause of diseases classified elsewhere: Secondary | ICD-10-CM | POA: Diagnosis not present

## 2024-04-13 DIAGNOSIS — I951 Orthostatic hypotension: Secondary | ICD-10-CM | POA: Diagnosis not present

## 2024-04-16 ENCOUNTER — Other Ambulatory Visit (HOSPITAL_COMMUNITY): Payer: Self-pay | Admitting: Cardiology

## 2024-04-16 DIAGNOSIS — I4891 Unspecified atrial fibrillation: Secondary | ICD-10-CM

## 2024-04-20 DIAGNOSIS — Z79899 Other long term (current) drug therapy: Secondary | ICD-10-CM | POA: Diagnosis not present

## 2024-04-20 DIAGNOSIS — Z944 Liver transplant status: Secondary | ICD-10-CM | POA: Diagnosis not present

## 2024-04-20 DIAGNOSIS — Z4823 Encounter for aftercare following liver transplant: Secondary | ICD-10-CM | POA: Diagnosis not present

## 2024-04-24 ENCOUNTER — Ambulatory Visit: Attending: Cardiology

## 2024-04-24 DIAGNOSIS — I4891 Unspecified atrial fibrillation: Secondary | ICD-10-CM

## 2024-04-24 DIAGNOSIS — I6523 Occlusion and stenosis of bilateral carotid arteries: Secondary | ICD-10-CM | POA: Diagnosis not present

## 2024-05-03 DIAGNOSIS — Z79899 Other long term (current) drug therapy: Secondary | ICD-10-CM | POA: Diagnosis not present

## 2024-05-03 DIAGNOSIS — Z944 Liver transplant status: Secondary | ICD-10-CM | POA: Diagnosis not present

## 2024-05-03 DIAGNOSIS — Z4823 Encounter for aftercare following liver transplant: Secondary | ICD-10-CM | POA: Diagnosis not present

## 2024-05-05 DIAGNOSIS — R0902 Hypoxemia: Secondary | ICD-10-CM | POA: Diagnosis not present

## 2024-05-05 DIAGNOSIS — N189 Chronic kidney disease, unspecified: Secondary | ICD-10-CM | POA: Diagnosis not present

## 2024-05-05 DIAGNOSIS — J90 Pleural effusion, not elsewhere classified: Secondary | ICD-10-CM | POA: Diagnosis not present

## 2024-05-05 DIAGNOSIS — T50Z94A Poisoning by other vaccines and biological substances, undetermined, initial encounter: Secondary | ICD-10-CM | POA: Diagnosis not present

## 2024-05-07 ENCOUNTER — Encounter

## 2024-05-11 DIAGNOSIS — Z950 Presence of cardiac pacemaker: Secondary | ICD-10-CM | POA: Diagnosis not present

## 2024-05-16 DIAGNOSIS — J9811 Atelectasis: Secondary | ICD-10-CM | POA: Diagnosis not present

## 2024-05-16 DIAGNOSIS — B354 Tinea corporis: Secondary | ICD-10-CM | POA: Diagnosis not present

## 2024-05-16 DIAGNOSIS — R052 Subacute cough: Secondary | ICD-10-CM | POA: Diagnosis not present

## 2024-05-16 DIAGNOSIS — J90 Pleural effusion, not elsewhere classified: Secondary | ICD-10-CM | POA: Diagnosis not present

## 2024-05-17 ENCOUNTER — Other Ambulatory Visit (HOSPITAL_COMMUNITY): Payer: Self-pay | Admitting: Nephrology

## 2024-05-17 DIAGNOSIS — Z79899 Other long term (current) drug therapy: Secondary | ICD-10-CM | POA: Diagnosis not present

## 2024-05-17 DIAGNOSIS — N1832 Chronic kidney disease, stage 3b: Secondary | ICD-10-CM

## 2024-05-17 DIAGNOSIS — D638 Anemia in other chronic diseases classified elsewhere: Secondary | ICD-10-CM

## 2024-05-17 DIAGNOSIS — Z4823 Encounter for aftercare following liver transplant: Secondary | ICD-10-CM | POA: Diagnosis not present

## 2024-05-23 ENCOUNTER — Encounter (INDEPENDENT_AMBULATORY_CARE_PROVIDER_SITE_OTHER): Payer: Self-pay | Admitting: Gastroenterology

## 2024-05-24 ENCOUNTER — Ambulatory Visit (HOSPITAL_COMMUNITY)
Admission: RE | Admit: 2024-05-24 | Discharge: 2024-05-24 | Disposition: A | Discharge status: Home / Self Care | Source: Ambulatory Visit | Attending: Nephrology | Admitting: Nephrology

## 2024-05-24 DIAGNOSIS — D638 Anemia in other chronic diseases classified elsewhere: Secondary | ICD-10-CM | POA: Diagnosis not present

## 2024-05-24 DIAGNOSIS — N1832 Chronic kidney disease, stage 3b: Secondary | ICD-10-CM | POA: Diagnosis not present

## 2024-05-24 DIAGNOSIS — N189 Chronic kidney disease, unspecified: Secondary | ICD-10-CM | POA: Diagnosis not present

## 2024-05-31 DIAGNOSIS — Z944 Liver transplant status: Secondary | ICD-10-CM | POA: Diagnosis not present

## 2024-05-31 DIAGNOSIS — Z4823 Encounter for aftercare following liver transplant: Secondary | ICD-10-CM | POA: Diagnosis not present

## 2024-05-31 DIAGNOSIS — Z79899 Other long term (current) drug therapy: Secondary | ICD-10-CM | POA: Diagnosis not present

## 2024-06-04 DIAGNOSIS — J90 Pleural effusion, not elsewhere classified: Secondary | ICD-10-CM | POA: Diagnosis not present

## 2024-06-04 DIAGNOSIS — Z4823 Encounter for aftercare following liver transplant: Secondary | ICD-10-CM | POA: Diagnosis not present

## 2024-06-04 DIAGNOSIS — Z944 Liver transplant status: Secondary | ICD-10-CM | POA: Diagnosis not present

## 2024-06-04 DIAGNOSIS — Z79899 Other long term (current) drug therapy: Secondary | ICD-10-CM | POA: Diagnosis not present

## 2024-06-04 DIAGNOSIS — T50Z94A Poisoning by other vaccines and biological substances, undetermined, initial encounter: Secondary | ICD-10-CM | POA: Diagnosis not present

## 2024-06-04 DIAGNOSIS — N189 Chronic kidney disease, unspecified: Secondary | ICD-10-CM | POA: Diagnosis not present

## 2024-06-04 DIAGNOSIS — R0902 Hypoxemia: Secondary | ICD-10-CM | POA: Diagnosis not present

## 2024-06-05 NOTE — Telephone Encounter (Signed)
 Called pt with lab results and discussed her low ANC. Pt instructed to administer Nivestym once a day times two. Pt advised on prevention of infection. All questions answered. Verbal order for Nivestym given to Fonda PharmD at [facility] and PA is being processed by [facility]. Daughter also updated.

## 2024-06-05 NOTE — Progress Notes (Signed)
 [facility] Hepatology On-Call Note  Received a call from [facility] overnight regarding a critical lab value for Ms. Karla Young.   Labs collected on 10/27, found to have an absolute neutrophil count of 0.4  Message sent to patient's primary Hepatologist, Dr. Zamor.   Leeroy Gamble, MD Gastroenterology & Hepatology Fellow PGY-6

## 2024-06-09 ENCOUNTER — Emergency Department (HOSPITAL_COMMUNITY)

## 2024-06-09 ENCOUNTER — Emergency Department (HOSPITAL_COMMUNITY)
Admission: EM | Admit: 2024-06-09 | Discharge: 2024-06-10 | Disposition: A | Attending: Emergency Medicine | Admitting: Emergency Medicine

## 2024-06-09 ENCOUNTER — Other Ambulatory Visit: Payer: Self-pay

## 2024-06-09 ENCOUNTER — Encounter (HOSPITAL_COMMUNITY): Payer: Self-pay | Admitting: Emergency Medicine

## 2024-06-09 DIAGNOSIS — D709 Neutropenia, unspecified: Secondary | ICD-10-CM

## 2024-06-09 DIAGNOSIS — R197 Diarrhea, unspecified: Secondary | ICD-10-CM | POA: Diagnosis not present

## 2024-06-09 DIAGNOSIS — Z944 Liver transplant status: Secondary | ICD-10-CM | POA: Diagnosis not present

## 2024-06-09 DIAGNOSIS — R188 Other ascites: Secondary | ICD-10-CM | POA: Diagnosis not present

## 2024-06-09 DIAGNOSIS — R161 Splenomegaly, not elsewhere classified: Secondary | ICD-10-CM | POA: Diagnosis not present

## 2024-06-09 DIAGNOSIS — J9811 Atelectasis: Secondary | ICD-10-CM | POA: Diagnosis not present

## 2024-06-09 DIAGNOSIS — R079 Chest pain, unspecified: Secondary | ICD-10-CM | POA: Diagnosis not present

## 2024-06-09 DIAGNOSIS — D72819 Decreased white blood cell count, unspecified: Secondary | ICD-10-CM | POA: Diagnosis not present

## 2024-06-09 DIAGNOSIS — N179 Acute kidney failure, unspecified: Secondary | ICD-10-CM | POA: Insufficient documentation

## 2024-06-09 DIAGNOSIS — R531 Weakness: Secondary | ICD-10-CM | POA: Insufficient documentation

## 2024-06-09 DIAGNOSIS — Z7901 Long term (current) use of anticoagulants: Secondary | ICD-10-CM | POA: Diagnosis not present

## 2024-06-09 DIAGNOSIS — I10 Essential (primary) hypertension: Secondary | ICD-10-CM | POA: Diagnosis not present

## 2024-06-09 DIAGNOSIS — K7460 Unspecified cirrhosis of liver: Secondary | ICD-10-CM | POA: Diagnosis not present

## 2024-06-09 DIAGNOSIS — K439 Ventral hernia without obstruction or gangrene: Secondary | ICD-10-CM | POA: Diagnosis not present

## 2024-06-09 DIAGNOSIS — J90 Pleural effusion, not elsewhere classified: Secondary | ICD-10-CM | POA: Diagnosis not present

## 2024-06-09 LAB — URINALYSIS, W/ REFLEX TO CULTURE (INFECTION SUSPECTED)
Bilirubin Urine: NEGATIVE
Glucose, UA: NEGATIVE mg/dL
Hgb urine dipstick: NEGATIVE
Ketones, ur: NEGATIVE mg/dL
Leukocytes,Ua: NEGATIVE
Nitrite: NEGATIVE
Protein, ur: NEGATIVE mg/dL
Specific Gravity, Urine: 1.013 (ref 1.005–1.030)
pH: 5 (ref 5.0–8.0)

## 2024-06-09 LAB — CBC WITH DIFFERENTIAL/PLATELET
Abs Immature Granulocytes: 0 K/uL (ref 0.00–0.07)
Basophils Absolute: 0.1 K/uL (ref 0.0–0.1)
Basophils Relative: 1 %
Eosinophils Absolute: 0.4 K/uL (ref 0.0–0.5)
Eosinophils Relative: 9 %
HCT: 29.5 % — ABNORMAL LOW (ref 36.0–46.0)
Hemoglobin: 9.2 g/dL — ABNORMAL LOW (ref 12.0–15.0)
Immature Granulocytes: 0 %
Lymphocytes Relative: 52 %
Lymphs Abs: 2.3 K/uL (ref 0.7–4.0)
MCH: 27.5 pg (ref 26.0–34.0)
MCHC: 31.2 g/dL (ref 30.0–36.0)
MCV: 88.1 fL (ref 80.0–100.0)
Monocytes Absolute: 1.6 K/uL — ABNORMAL HIGH (ref 0.1–1.0)
Monocytes Relative: 37 %
Neutro Abs: 0 K/uL — CL (ref 1.7–7.7)
Neutrophils Relative %: 1 %
Platelets: 180 K/uL (ref 150–400)
RBC: 3.35 MIL/uL — ABNORMAL LOW (ref 3.87–5.11)
RDW: 12.8 % (ref 11.5–15.5)
WBC: 4.4 K/uL (ref 4.0–10.5)
nRBC: 0 % (ref 0.0–0.2)

## 2024-06-09 LAB — COMPREHENSIVE METABOLIC PANEL WITH GFR
ALT: 13 U/L (ref 0–44)
AST: 19 U/L (ref 15–41)
Albumin: 4.1 g/dL (ref 3.5–5.0)
Alkaline Phosphatase: 142 U/L — ABNORMAL HIGH (ref 38–126)
Anion gap: 15 (ref 5–15)
BUN: 52 mg/dL — ABNORMAL HIGH (ref 8–23)
CO2: 17 mmol/L — ABNORMAL LOW (ref 22–32)
Calcium: 9.4 mg/dL (ref 8.9–10.3)
Chloride: 97 mmol/L — ABNORMAL LOW (ref 98–111)
Creatinine, Ser: 2.27 mg/dL — ABNORMAL HIGH (ref 0.44–1.00)
GFR, Estimated: 24 mL/min — ABNORMAL LOW (ref >60)
Glucose, Bld: 114 mg/dL — ABNORMAL HIGH (ref 70–99)
Potassium: 4.7 mmol/L (ref 3.5–5.1)
Sodium: 129 mmol/L — ABNORMAL LOW (ref 135–145)
Total Bilirubin: 1.2 mg/dL (ref 0.0–1.2)
Total Protein: 6.5 g/dL (ref 6.5–8.1)

## 2024-06-09 LAB — RESP PANEL BY RT-PCR (RSV, FLU A&B, COVID)  RVPGX2
Influenza A by PCR: NEGATIVE
Influenza B by PCR: NEGATIVE
Resp Syncytial Virus by PCR: NEGATIVE
SARS Coronavirus 2 by RT PCR: NEGATIVE

## 2024-06-09 LAB — LACTIC ACID, PLASMA
Lactic Acid, Venous: 2.8 mmol/L (ref 0.5–1.9)
Lactic Acid, Venous: 1.3 mmol/L (ref 0.5–1.9)

## 2024-06-09 LAB — PROTIME-INR
INR: 1.4 — ABNORMAL HIGH (ref 0.8–1.2)
Prothrombin Time: 18.1 s — ABNORMAL HIGH (ref 11.4–15.2)

## 2024-06-09 LAB — C DIFFICILE QUICK SCREEN W PCR REFLEX
C Diff antigen: NEGATIVE
C Diff interpretation: NOT DETECTED
C Diff toxin: NEGATIVE

## 2024-06-09 MED ORDER — METRONIDAZOLE 500 MG/100ML IV SOLN
500.0000 mg | Freq: Once | INTRAVENOUS | Status: AC
  Administered 2024-06-09: 500 mg via INTRAVENOUS
  Filled 2024-06-09: qty 100

## 2024-06-09 MED ORDER — SODIUM CHLORIDE 0.9 % IV BOLUS
1000.0000 mL | Freq: Once | INTRAVENOUS | Status: AC
  Administered 2024-06-09: 1000 mL via INTRAVENOUS

## 2024-06-09 MED ORDER — SODIUM CHLORIDE 0.9 % IV SOLN
2.0000 g | Freq: Once | INTRAVENOUS | Status: AC
  Administered 2024-06-09: 2 g via INTRAVENOUS
  Filled 2024-06-09: qty 10

## 2024-06-09 MED ORDER — VANCOMYCIN HCL IN DEXTROSE 1-5 GM/200ML-% IV SOLN
1000.0000 mg | Freq: Once | INTRAVENOUS | Status: AC
  Administered 2024-06-09: 1000 mg via INTRAVENOUS
  Filled 2024-06-09: qty 200

## 2024-06-09 NOTE — Discharge Instructions (Addendum)
 Transfer patient to [facility] in [facility] Dr. Lang hepatology fellow excepting the patient

## 2024-06-09 NOTE — ED Provider Notes (Signed)
 Patient is supposed to get another injection of Nivestym tomorrow.  If she is still at any pain in she can go ahead and get her injection of Nivestym.  Her daughter has the medicine   Suzette Pac, MD 06/09/24 [phone]

## 2024-06-09 NOTE — ED Notes (Signed)
 Daughter left medication Nivestyn to give to pt in AM. Bayleigh RN at [facility] aware medication will be transported w/ pt and will discuss w/ hematologist on administration.

## 2024-06-09 NOTE — ED Notes (Signed)
 abx delayed d/t no IV access; pt is very difficult to gain access on.

## 2024-06-09 NOTE — ED Notes (Signed)
 Report given to [facility] RN [employer]

## 2024-06-09 NOTE — ED Triage Notes (Addendum)
 Pt to ed pov c/o Sob, cp, ongoing diarrhea for 3 weeks. Pt states has sores in their mouth. States difficulties walking/eating. Does not know if running fevers. Pt was notified of critical lab values of hbg and neutrophils Tuesday. Pt recipient of liver transplant 12/16/2023, ICD placed 01/2024

## 2024-06-09 NOTE — Consult Note (Signed)
 CODE SEPSIS - PHARMACY COMMUNICATION  **Broad Spectrum Antibiotics should be administered within 1 hour of Sepsis diagnosis**  Time Code Sepsis Called/Page Received: 1621  Antibiotics Ordered: Vanc x1 G, Flagyl 500mg  x1, Aztreonam 2g x1  Time of 1st antibiotic administration: 1734  Additional action taken by pharmacy: reached out to RN to ensure timely antibiotic administration. RN stated they are having difficulty obtaining patient IV line access.  If necessary, Name of Provider/Nurse Contacted: Alen Covell    Karla Young ,PharmD Clinical Pharmacist  06/09/2024  4:34 PM

## 2024-06-09 NOTE — ED Provider Notes (Signed)
 [place] EMERGENCY DEPARTMENT AT [facility] Provider Note   CSN: [id number] Arrival date & time: 06/09/24  1538     Patient presents with: Multiple Complaints   Karla Young is a 63 y.o. female.  {Add pertinent medical, surgical, social history, OB history to YEP:67052} Patient has a history of a liver transplant and has had diarrhea for 3 weeks.  She had blood work done a couple days ago that showed a leukopenia  The history is provided by the patient and medical records. No language interpreter was used.  Weakness Severity:  Moderate Onset quality:  Gradual Timing:  Constant Progression:  Worsening Chronicity:  Recurrent Relieved by:  Nothing Worsened by:  Nothing Ineffective treatments:  None tried Associated symptoms: no abdominal pain, no chest pain, no cough, no diarrhea, no frequency, no headaches and no seizures        Prior to Admission medications   Medication Sig Start Date End Date Taking? Authorizing Provider  cycloSPORINE  modified (NEORAL ) 100 MG capsule Take 100 mg by mouth 2 (two) times daily.   Yes [provider]  dapsone 100 MG tablet Take 100 mg by mouth daily.   Yes [provider]  ELIQUIS 5 MG TABS tablet Take 5 mg by mouth in the morning.   Yes [provider]  entecavir (BARACLUDE) 0.5 MG tablet Take 0.5 mg by mouth every other day.   Yes [provider]  hydrOXYzine  (ATARAX ) 25 MG tablet Take 1 tablet (25 mg total) by mouth every 8 (eight) hours as needed for anxiety or nausea. 07/23/22  Yes Emokpae, Courage, MD  ipratropium (ATROVENT) 0.06 % nasal spray Place 2 sprays into both nostrils 2 (two) times daily as needed for rhinitis. 05/16/24  Yes [provider]  levothyroxine  (SYNTHROID ) 112 MCG tablet Take 112 mcg by mouth every morning.   Yes [provider]  mirtazapine (REMERON) 15 MG tablet Take 15 mg by mouth at bedtime. 06/01/24  Yes [provider]  NIVESTYM 480  MCG/0.8ML SOSY injection Inject 480 mcg into the skin daily. For 2 days 06/06/24  Yes [provider]  pantoprazole  (PROTONIX ) 40 MG tablet Take 1 tablet (40 mg total) by mouth 2 (two) times daily. 08/24/23  Yes Shirlean Therisa ORN, NP  traZODone  (DESYREL ) 50 MG tablet Take 50 mg by mouth at bedtime. 05/14/24  Yes [provider]  MAGNESIUM -OXIDE 400 (240 Mg) MG tablet Take 1 tablet by mouth 2 (two) times daily. Patient not taking: Reported on 06/09/2024 05/29/24   [provider]  mycophenolate (MYFORTIC) 360 MG TBEC EC tablet Take 720 mg by mouth 2 (two) times daily. Patient not taking: Reported on 06/09/2024    [provider]    Allergies: Cefepime     Review of Systems  Constitutional:  Negative for appetite change and fatigue.  HENT:  Negative for congestion, ear discharge and sinus pressure.   Eyes:  Negative for discharge.  Respiratory:  Negative for cough.   Cardiovascular:  Negative for chest pain.  Gastrointestinal:  Negative for abdominal pain and diarrhea.  Genitourinary:  Negative for frequency and hematuria.  Musculoskeletal:  Negative for back pain.  Skin:  Negative for rash.  Neurological:  Positive for weakness. Negative for seizures and headaches.  Psychiatric/Behavioral:  Negative for hallucinations.     Updated Vital Signs BP (!) 91/56   Pulse 74   Temp 98.8 F (37.1 C) (Oral)   Resp 17   Ht 5' 2 (1.575 m)   Wt  59.9 kg   SpO2 93%   BMI 24.14 kg/m   Physical Exam Vitals and nursing note reviewed.  Constitutional:      Appearance: She is well-developed.  HENT:     Head: Normocephalic.     Nose: Nose normal.  Eyes:     General: No scleral icterus.    Conjunctiva/sclera: Conjunctivae normal.  Neck:     Thyroid : No thyromegaly.  Cardiovascular:     Rate and Rhythm: Normal rate and regular rhythm.     Heart sounds: No murmur heard.    No friction rub. No gallop.  Pulmonary:     Breath sounds: No stridor. No wheezing or  rales.  Chest:     Chest wall: No tenderness.  Abdominal:     General: There is no distension.     Tenderness: There is abdominal tenderness. There is no rebound.  Musculoskeletal:        General: Normal range of motion.     Cervical back: Neck supple.  Lymphadenopathy:     Cervical: No cervical adenopathy.  Skin:    Findings: No erythema or rash.  Neurological:     Mental Status: She is alert and oriented to person, place, and time.     Motor: No abnormal muscle tone.     Coordination: Coordination normal.  Psychiatric:        Behavior: Behavior normal.     (all labs ordered are listed, but only abnormal results are displayed) Labs Reviewed  COMPREHENSIVE METABOLIC PANEL WITH GFR - Abnormal; Notable for the following components:      Result Value   Sodium 129 (*)    Chloride 97 (*)    CO2 17 (*)    Glucose, Bld 114 (*)    BUN 52 (*)    Creatinine, Ser 2.27 (*)    Alkaline Phosphatase 142 (*)    GFR, Estimated 24 (*)    All other components within normal limits  LACTIC ACID, PLASMA - Abnormal; Notable for the following components:   Lactic Acid, Venous 2.8 (*)    All other components within normal limits  CBC WITH DIFFERENTIAL/PLATELET - Abnormal; Notable for the following components:   RBC 3.35 (*)    Hemoglobin 9.2 (*)    HCT 29.5 (*)    Neutro Abs 0.0 (*)    Monocytes Absolute 1.6 (*)    All other components within normal limits  PROTIME-INR - Abnormal; Notable for the following components:   Prothrombin Time 18.1 (*)    INR 1.4 (*)    All other components within normal limits  URINALYSIS, W/ REFLEX TO CULTURE (INFECTION SUSPECTED) - Abnormal; Notable for the following components:   Bacteria, UA RARE (*)    All other components within normal limits  CULTURE, BLOOD (ROUTINE X 2)  RESP PANEL BY RT-PCR (RSV, FLU A&B, COVID)  [id number]  CULTURE, BLOOD (ROUTINE X 2)  GASTROINTESTINAL PANEL BY PCR, STOOL (REPLACES STOOL CULTURE)  C DIFFICILE QUICK SCREEN W PCR REFLEX     LACTIC ACID, PLASMA  PATHOLOGIST SMEAR REVIEW    EKG: None  Radiology: CT ABDOMEN PELVIS WO CONTRAST Result Date: 06/09/2024 CLINICAL DATA:  Abdominal pain, diarrhea for 3 weeks EXAM: CT ABDOMEN AND PELVIS WITHOUT CONTRAST TECHNIQUE: Multidetector CT imaging of the abdomen and pelvis was performed following the standard protocol without IV contrast. RADIATION DOSE REDUCTION: This exam was performed according to the departmental dose-optimization program which includes automated exposure control, adjustment of the mA and/or kV according to  patient size and/or use of iterative reconstruction technique. COMPARISON:  02/21/2023 FINDINGS: Lower chest: Minimal pleural fluid within the right major fissure. Lung bases are clear. Hepatobiliary: Prior cholecystectomy. Cirrhotic morphology of the liver. No focal parenchymal abnormalities on this unenhanced exam. Pancreas: Unremarkable unenhanced appearance. Spleen: The spleen is enlarged, measuring 15.7 x 14.0 x 5.6 cm. No focal parenchymal abnormalities on this unenhanced exam. Adrenals/Urinary Tract: No urinary tract calculi or obstructive uropathy within either kidney. The adrenals and bladder are unremarkable. Stomach/Bowel: No bowel obstruction or ileus. Normal retrocecal appendix. There are gas fluid levels throughout the colon consistent with given history of diarrhea. No bowel wall thickening or inflammatory change. Vascular/Lymphatic: Atherosclerosis of the abdominal aorta. Numerous varices are seen within the upper abdomen adjacent to the spleen stomach consistent with portal venous hypertension. Assessment of the vascular structures is limited without IV contrast. No pathologic adenopathy. Reproductive: Status post hysterectomy. No adnexal masses. Other: Trace upper abdominal ascites.  No free intraperitoneal gas. Prior umbilical hernia repair. There are small fat containing ventral hernias within the midline supraumbilical region and right upper  quadrant anterior abdominal wall. No bowel herniation. Musculoskeletal: No acute or destructive bony abnormalities. Reconstructed images demonstrate no additional findings. IMPRESSION: 1. 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 2. Trace upper abdominal ascites. 3. Minimal pleural fluid within the right major fissure. 4. Gas fluid levels throughout the colon compatible with given history of diarrhea. No obstruction or ileus. 5. Small fat containing ventral hernias within the midline supraumbilical region and right upper quadrant anterior abdominal wall. 6.  Aortic Atherosclerosis (ICD10-I70.0). Electronically Signed   By: Ozell Daring M.D.   On: 06/09/2024 19:03   DG Chest Port 1 View if patient is in a treatment room. Result Date: 06/09/2024 CLINICAL DATA:  Suspected sepsis. Shortness of breath, chest pain, and diarrhea. EXAM: PORTABLE CHEST 1 VIEW COMPARISON:  02/19/2023. FINDINGS: The heart size and mediastinal contours are within normal limits. There is a small right pleural effusion with atelectasis at the right lung base. No pneumothorax is seen. A loop recorder device is present over the chest. No acute osseous abnormality. IMPRESSION: Small right pleural effusion with atelectasis. Electronically Signed   By: Leita Birmingham M.D.   On: 06/09/2024 16:45    {Document cardiac monitor, telemetry assessment procedure when appropriate:32947} Procedures   Medications Ordered in the ED  aztreonam (AZACTAM) 2 g in sodium chloride  0.9 % 100 mL IVPB (0 g Intravenous Stopped 06/09/24 1850)  metroNIDAZOLE (FLAGYL) IVPB 500 mg (0 mg Intravenous Stopped 06/09/24 1850)  vancomycin  (VANCOCIN ) IVPB 1000 mg/200 mL premix (0 mg Intravenous Stopped 06/09/24 1937)  sodium chloride  0.9 % bolus 1,000 mL (0 mLs Intravenous Stopped 06/09/24 1850)  sodium chloride  0.9 % bolus 1,000 mL (0 mLs Intravenous Stopped 06/09/24 1953)  sodium chloride  0.9 % bolus 1,000 mL (1,000 mLs  Intravenous New Bag/Given 06/09/24 2021)  Patient with an absolute neutrophil count of 0.  She has an AKI and is status post liver transplant.  I spoke with the hepatologist at [facility] in [place] and the he wanted her transported down to [place].  The accepting doctor is Dr. Odis Essex hepatology fellow   CRITICAL CARE Performed by: Fairy Sermon Total critical care time: 40 minutes Critical care time was exclusive of separately billable procedures and treating other patients. Critical care was necessary to treat or prevent imminent or life-threatening deterioration. Critical care was time spent personally by me on the following activities: development  of treatment plan with patient and/or surrogate as well as nursing, discussions with consultants, evaluation of patient's response to treatment, examination of patient, obtaining history from patient or surrogate, ordering and performing treatments and interventions, ordering and review of laboratory studies, ordering and review of radiographic studies, pulse oximetry and re-evaluation of patient's condition.  {Click here for ABCD2, HEART and other calculators REFRESH Note before signing:1}                              Medical Decision Making Amount and/or Complexity of Data Reviewed Labs: ordered. Radiology: ordered.  Risk Prescription drug management.   Leukopenia AKI and status post liver transplant.  Patient will be transported to [facility] in [place] with Dr. Lang at the excepting hepatology fellow  {Document critical care time when appropriate  Document review of labs and clinical decision tools ie [id number], etc  Document your independent review of radiology images and any outside records  Document your discussion with family members, caretakers and with consultants  Document social determinants of health affecting pt's care  Document your decision making why or why not admission, treatments were  needed:32947:::1}   Final diagnoses:  None    ED Discharge Orders     None

## 2024-06-09 NOTE — Sepsis Progress Note (Signed)
 Elink following code sepsis

## 2024-06-10 DIAGNOSIS — Z7401 Bed confinement status: Secondary | ICD-10-CM | POA: Diagnosis not present

## 2024-06-10 DIAGNOSIS — R9431 Abnormal electrocardiogram [ECG] [EKG]: Secondary | ICD-10-CM | POA: Diagnosis not present

## 2024-06-10 DIAGNOSIS — D702 Other drug-induced agranulocytosis: Secondary | ICD-10-CM | POA: Diagnosis not present

## 2024-06-10 DIAGNOSIS — I959 Hypotension, unspecified: Secondary | ICD-10-CM | POA: Diagnosis not present

## 2024-06-10 DIAGNOSIS — D849 Immunodeficiency, unspecified: Secondary | ICD-10-CM | POA: Diagnosis not present

## 2024-06-10 DIAGNOSIS — Z944 Liver transplant status: Secondary | ICD-10-CM | POA: Diagnosis not present

## 2024-06-10 DIAGNOSIS — N179 Acute kidney failure, unspecified: Secondary | ICD-10-CM | POA: Diagnosis not present

## 2024-06-10 DIAGNOSIS — A419 Sepsis, unspecified organism: Secondary | ICD-10-CM | POA: Diagnosis not present

## 2024-06-10 NOTE — H&P (Signed)
 [facility]  H&P Admission date: 06/10/2024  Date of service: 06/10/24                                                                   Patient name: Karla Young DOB: 12/01/1960 MRN: [id number]     History of Present Illness    Karla Young is a 63 y.o. 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   Patient presented to outside hospital for constellation of diarrhea, weakness over the last 3 weeks.  Diarrhea characterized by watery bowel movements at least 5 bowel movements daily for at least 3 weeks.  She has been advised to discontinue Myfortic and magnesium  which she states that she has been off of for the last 2 weeks ago her diarrhea symptoms continue.  She has had poor p.o. intake due to reported sores in her mouth making swallowing painful.  She has been weak lately and remarkably fatigued.  No nausea or vomiting.  No abdominal pain or abdominal distention.  No lower extremity edema.  She endorses a chronic cough, baseline shortness of breath, she is not on oxygen  at home though she was placed on oxygen  at the outside hospital.  She remains compliant with her immunosuppression regimen and is cyclosporine  100 mg daily.  No rashes on her body.  No burning or discomfort with urination or other urinary symptoms.  She recently found to have neutropenia and for this reason was prescribed a CSF into the first dose on 11/1 after receiving it in the mail.   Workup at outside hospital demonstrated acute kidney injury on CKD with a serum creatinine of 2.27 up from creatinine on 10/27, her ANC was 0.0, she is found to be afebrile she was given broad-spectrum antibiotics at the outside hospital.  C.  difficile was checked and it was negative.  CT abdomen pelvis single phase obtained without features of bowel obstruction or colitis.  UA without evidence of infection.  Trace upper abdominal ascites noted on CT, small pleural effusion observed on chest x-ray.  She was transferred to [facility] for hepatology admission.   Medical History[1]  Surgical History[2]  Current Medications[3]  Allergies[4]  Social History   Social History Narrative  . Not on file   Social History[5]  Family History[6]    Objective  VITALS:  Current  Temperature: 99 F (37.2 C)  Heart Rate: 75  Blood Pressure: (!) 103/47  Resp Rate: 18  Oxygen  Sats: 95 %  Weight: 65.6 kg (144 lb 10 oz)    Intake/Output Summary (Last 24 hours) at 06/10/2024 0317 Last data filed at 06/10/2024 0300 Gross per 24 hour  Intake 0 ml  Output 101 ml  Net -101 ml    Physical Exam:  General: Alert, Oriented x 3. No acute distress. Eye: Sclera anicteric.  HEENT: Small red spots on tongue, poor visualization of posterior oropharynx Respiratory: Lungs clear to auscultation bilaterally.  Cardiovascular: Normal rate. Regular rhythm. S1S2. No edema. Gastrointestinal: Abdomen non-distended, normal bowel sounds. Soft,  non-tender to palpation. No ascites Genitourinary: Deferred. Musculoskeletal: Moves all extremities.  Integumentary: Warm. Dry. No rash.  Neurologic: Normal strength, sensation and no focal deficits   Psychiatric: Cooperative. Appropriate mood & affect. Normal judgment.    LABS: Lab Results  Component Value Date   WBC 2.4 (L) 06/04/2024   HGB 9.4 (L) 06/04/2024   HCT 30.3 (L) 06/04/2024   MCV 87 06/04/2024   PLT 100 (L) 04/17/2024   Lab Results  Component Value Date   INR 1.0 04/14/2024   INR 1.0 03/19/2024   INR 1.1 01/20/2024   PROTIME 10.9 04/14/2024   PROTIME 14.3 01/20/2024   PROTIME 14.4 01/18/2024   Lab Results  Component Value Date   GLUCOSE 102 (H) 06/04/2024   CALCIUM 9.3 06/04/2024    NA 125 (L) 06/04/2024   K 5.3 (H) 06/04/2024   CO2 15 (L) 06/04/2024   CL 95 (L) 06/04/2024   BUN 23 06/04/2024   CREATININE 1.56 (H) 06/04/2024   Lab Results  Component Value Date   BILITOT 0.6 06/04/2024   BILIDIR 0.26 06/04/2024   ALBUMIN  3.5 04/17/2024   AST 13 06/04/2024   ALT 10 06/04/2024   PROT 5.4 (L) 04/17/2024      CT abdomen pelvis 11/1  1. Cirrhosis, with portal venous hypertension manifested by numerous  varices and splenomegaly.  2. Trace upper abdominal ascites.  3. Minimal pleural fluid within the right major fissure.  4. Gas fluid levels throughout the colon compatible with given  history of diarrhea. No obstruction or ileus.  5. Small fat containing ventral hernias within the midline  supraumbilical region and right upper quadrant anterior abdominal  wall.  6. Aortic Atherosclerosis (ICD10-I70.0).    Assessment and Plan  Karla Young is a 63 y.o. 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  Neutropenia New problem, WBC 4.4 with ANC of 0.0, afebrile, status post 1 dose of Nivestym 11/1 Considering medication induced though Myfortic has been held recently, dapsone can cause this, otherwise considering infection as next most likely contributor, GVHD is a thought given diarrhea, no rash however - low suspicion Will continue to hold Myfortic, hold dapsone Check blood cultures, UA does not suggest urinary infection Check CMV, EBV, HSV Start folic acid  Check B12, iron panel Trend CBC with differential, will plan to give colony stimulating factor after review of ANC  In the absence of fever, holding off on antibiotics  Acute kidney injury on CKD sCr 2.27 up from 1.56 on 10/27 Elevated BUN of 52,  this is most likely prerenal from hypovolemia from her diarrhea Urinalysis without cast Status post IV fluids at OSH, will continue supplemental mIVF now and monitor renal function, encourage p.o. intake and monitor cyclosporine  levels  Acute diarrhea 3 weeks of watery diarrhea Has continued despite cessation of Myfortic C. difficile negative 11/1 CMV negative 10/27 Check GI pathogen panel If unresolved without identified etiology will consider colonoscopy (if neutropenia improves)  Immunosuppressed Status  Currently on cyclosporine  100 mg twice daily Continue home cyclosporine  dose for now, holding Myfortic given neutropenia and diarrhea Monitor cyclosporine  trough Prophylaxis: Dapsone-hold due to neutropenia  History of CMV viremia She is off of suppressive therapy, CMV levels have been negative most recently x3 over the last month Will continue to  monitor CMV  Oral lesions Complains of painful oral lesions associated with odynophagia, no signs of oropharyngeal candidiasis on examination, small red lesions noted on tongue, could be secondary to viral process, infectious work up as noted above   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 Continue Entecavir every other day  Headaches Suspect tension headaches from dehydration from diarrheal illness, CNI neurotoxicity would be another differential although lower concern at this time Will check cryptococcal antigen given immunocompromise state  Hypothyroidism Continue home levothyroxine   Right internal jugular thrombus Continue home Eliquis 5 mg twice daily  GERD Continue home PPI  Sinus bradycardia S/P pacemaker  Update EKG    DVT prophylaxis: Eliquis Disposition: Continue inpatient monitoring for assessment and management of neutropenia and acute kidney injury     Morene Essex, DO Fellow, Hepatology [facility]  Hepatology attending addendum: I have seen, discussed,  examined the patient with the hepatology fellow.  I directly contributed to the formulation of the assessment and plan.  She is approximately 43-month status post orthotopic liver transplant for hep C and alcohol related cirrhosis.  Hep B core antibody positive allograft.  HCV ab+/NAT-.  She had admission to [facility] with shortness of breath and had a pleural effusion which was consistent with resolving hepatic hydrothorax.  She is on Eliquis for IJ thrombus.  She had a pacemaker and had AKI requiring dialysis postop.  She now presents with failure to thrive, multitude complaints, poor p.o. intake, diarrhea, headaches, mouth sores, and she is neutropenic.  Antimetabolite stopped.  CMV is not detected.  Clinically looks stable on exam.  Will do broad workup.  She will get G-CSF.  Hold dapsone.  Depending on trajectory may need scopes to exclude GI invasive CMV would be unlikely with negative viremia.  Check HCV PCR.  CXR only showing small effusion, so don't think this is driving presentation.   PT consult. 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 Check AFP.  Will request non-con outside Abd CT to review and she is due to chest CT for [facility] surveillance so may obtain. Target CyA trough 150 with neutropenia. RPP/C diff negative.       [1] Past Medical History: Diagnosis Date  . Alcoholic cirrhosis    (CMD)   . Ascites of liver 08/03/2022  . COPD (chronic obstructive pulmonary disease) 08/06/2021   Last Assessment & Plan:   Formatting of this note might be different from the original.  Stable.  -Resume home regimen  . Esophageal varices 09/02/2022  . Fibromyalgia 10/20/2022  . GERD (gastroesophageal reflux disease) 08/03/2022  . History of hepatitis C post treatment with cure 02/22/2022  . Hypertension 08/06/2021   Last Assessment & Plan:   Formatting of this note might be different from the original.  Hypotensive, systolic down to 97.  -Hold lisinopril  5 mg, Lasix  40 mg and  spironolactone  100 for now  . Hypothyroidism 02/12/2014  . Irritable bowel syndrome (IBS) 08/03/2021  . Other cirrhosis of liver 08/03/2021   Last Assessment & Plan:   Formatting of this note might be different from the original.  Alcoholic liver cirrhosis with ascites.  Abdomen distended.  Has not had paracentesis in the past.  Tmax 99.1.  WBC 6.6.  Influenza and COVID test negative.  INR - 2.1.  With thrombocytopenia of 71.  T. bili 6.1.  -Ultrasound-guided paracentesis in a.m.  - Ascitic Fluid analysis  -Continue 2 g ceftriaxone   . SBP (  spontaneous bacterial peritonitis)    (CMD) 02/27/2023  [2] Past Surgical History: Procedure Laterality Date  . BACK SURGERY    . CARDIAC ELECTROPHYSIOLOGY PROCEDURE N/A 01/20/2024   Leadless pacemaker implant performed by Fredia Marden Fairly, MD at [facility]  . CESAREAN SECTION    . CHOLECYSTECTOMY    . COLONOSCOPY  2016  . ESOPHAGOGASTRODUODENOSCOPY  08/2019  . HEMORROIDECTOMY    . LIVER TRANSPLANTATION N/A 12/17/2023   TRANSPLANT LIVER  [id number]  [id number] performed by Aloysius Donnamarie Overland, MD at [facility] OR  . PARTIAL HYSTERECTOMY     1 ovary remaining.  SABRA RECTAL PROLAPSE REPAIR    . WRIST SURGERY    [3]  Current Facility-Administered Medications:  .  acetaminophen  (TYLENOL ) tablet 650 mg, 650 mg, oral, Q6H PRN, Morene Jama Essex, DO, 650 mg at 06/10/24 0246 .  apixaban (ELIQUIS) tablet 5 mg, 5 mg, oral, BID, Benjamin Lee Robinson, DO .  cycloSPORINE  modified (NEORAL ) capsule 100 mg, 100 mg, oral, BID - Transplant, Morene Jama Essex, DO .  [Held by provider] dapsone tablet 100 mg, 100 mg, oral, Daily, Morene Jama Essex, DO .  dextrose  (D50W) 50 % injection 12.5 g, 12.5 g, intravenous, PRN, Morene Jama Essex, DO .  dextrose  (GLUTOSE) 40 % oral gel 15 g, 15 g, oral, PRN, Morene Jama Essex, DO .  entecavir (BARACLUDE) tablet 0.5 mg, 0.5 mg, oral, Every Other Day, Morene Jama Essex, DO .  folic acid  (FOLVITE ) tablet 1 mg, 1 mg,  oral, Daily, Morene Jama Essex, DO .  lactated ringer 's infusion, 75 mL/hr, intravenous, Continuous, Morene Jama Essex, DO, Last Rate: 75 mL/hr at 06/10/24 0256, 75 mL/hr at 06/10/24 0256 .  levothyroxine  (SYNTHROID ) tablet 112 mcg, 112 mcg, oral, Daily 0600, Morene Jama Essex, DO .  mirtazapine (REMERON) tablet 15 mg, 15 mg, oral, Nightly, Morene Jama Essex, DO, 15 mg at 06/10/24 0246 .  omeprazole  (PriLOSEC) DR capsule 20 mg, 20 mg, oral, Daily 0600, Morene Jama Essex, DO .  traZODone  (DESYREL ) tablet 50 mg, 50 mg, oral, At Bedtime PRN, Morene Jama Essex, DO [4] Allergies Allergen Reactions  . Maxipime  [Cefepime ]     Neurotoxicity related (July 2024)   [5] Social History Tobacco Use  . Smoking status: Former    Types: Cigarettes    Passive exposure: Past  . Smokeless tobacco: Never  Vaping Use  . Vaping status: Never Used  Substance Use Topics  . Alcohol use: Not Currently    Comment: Last: Nov 2023  . Drug use: Not Currently    Types: Cocaine, Marijuana    Comment: Quit: years ago  [6] Family History Problem Relation Name Age of Onset  . Coronary artery disease Mother    . Heart failure Mother    . Fibromyalgia Mother    . Heart attack Father    . Gout Sister Suzen Alder   . Graves' disease Sister Suzen Alder   . Kidney disease Sister Suzen Dragon   . Hypertension Sister Suzen Dragon   . Metabolic syndrome Sister Suzen Dragon   . Diabetes Brother    . No Known Problems Daughter Duwaine   . Alcohol abuse Maternal Uncle    . Cirrhosis Maternal Uncle    . Lung cancer Other         paternal aunts/uncles  . Autoimmune disease Neg Hx

## 2024-06-10 NOTE — Progress Notes (Signed)
 Acute Physical Therapy Evaluation  ASSESSMENT SUMMARY   ASSESSMENT:  Physical therapy orders received. The patient's chart was reviewed, and the patient was assessed.  Patient is a 63 y.o. female admitted on 06/10/2024 for medical diagnosis of neutropenia. Patients prior level of function was Independent with   prior to admission.  The patient is presenting today below baseline secondary to decreased activity tolerance, muscle weakness, and pain limiting function.  Patient's functional impairments include transfers, ambulation, and stairs. This session, the patient's highest level of mobility performed was Gait, requiring Close Supervision. Mobility was most limited by headache. The patient scored [facility] of 22 with 25.02% disability.. Based on clinical findings, patient will benefit from skilled physical therapy services to address stated impairments, with goal of improving activity limitations and overall function. . At this time, the patient will benefit from discharge to home when medically ready.    Goals for next session: 1-2 more sessions for stairs   PT Recommendation: PT Recommendation: Home with intermittent assistance PT Equipment Recommended: None   Home Living Environment: Type of Home House  Home Layout Multi-level, No bedroom on main level  Exterior Stairs - number 2 STE  Exterior Stairs - rails None  [employer] - number 15 steps to 2nd floor where primary bedroom and only bathroom are located  [employer] - Administrator, Sports / Tub Tub/Shower Unit  [employer] Currently Using None  Additional Comments     Prior Level of Function: Level of Independence Independent  Lives With Alone  Person(s) able to assist at d/c    Patient Responsibilities Personal ADLs, Meal Preparation, Manage Medications, Social Participation, Driving  Requires Assist With               PERTINENT MEDICAL INFORMATION  PT Treatment Diagnosis:    Difficulty Walking (not elsewhere specified)  Precautions: Other Therapy Precautions: Fall risk  Past Medical History: Medical History[1]  Past Surgical History: Surgical History[2]  Reason for Admission:  No chief complaint on file.   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 Patient presented to outside hospital for constellation of diarrhea, weakness over the last 3 weeks   Problem List: Problem List[3]   SUBJECTIVE  Communication: Communication: Nursing Communication Details: RN ok'd session and updated after   General: General Family/Caregiver Present: none    OBJECTIVE  Pain: Pain Assessment Pain Assessment: 0-10 Pain Score  : 9 Pain Type: Acute pain Pain Location: Head  Cognition: Orientation Level: Oriented x4  Range of Motion: Overall ROM Assessment: Within Functional Limits  Strength: Strength Assessment Overall Strength Assessment: Within Functional Limits   Condition After Therapy:  Back to bed, Nursing notified of condition, All needs within reach   INTERVENTIONS   Skilled PT Treatment Provided  Balance: Sitting - Static: Independent Sitting - Dynamic: Independent Standing - Static: Close supervision Standing - Dynamic: Close supervision Balance Additional Comments: no AD  Functional Mobility:  Bed Mobility Rolling: Close supervision Supine to Sit: Close supervision Sit to Supine: Close supervision Bed Mobility Additional Comments: HOB elevated Transfers Assistive Device: None Sit to Stand: Close supervision, Verbal Cues Stand to  Sit:  Close supervision, Verbal Cues Toilet Transfers - Type: Stand Step Toilet Transfers - Level of Assistance: Close supervision, Verbal Cues Transfers  Additional Comments: Cues for hip placement prior to STS and use of grab bars in toilet Mobility Gait Distance (ft): 10 ft Gait Additional Comments: Ambulated 85ft from EOB to bathroom, completed ADLs independently at sink and back to bed. Deferred further mobility d/t headache this session. Gait Assistance: Close supervision   Additional Therapeutic Interventions: Educated on role of PT in acute setting, PT POC, and dispo Animal Nutritionist Performed by Clinician:  Education on safe/proper technique Facilitation for proper positioning/movement in preparation for functional tasks Monitoring exercise/activity tolerance Verbal cues for proper technique Verbal cues for safe execution of functional tasks  Response to Interventions:  Demonstrates carryover of treatment strategies Improved Activity tolerance Improved Functional Mobility   PERFORMANCE OUTCOME MEASURES    [facility] - Highest Level Mobility JH-HLM Score - Goal: 7 JH-HLM Score - Performed/Achieved: Walks 10 steps or more JH-HLM Score - Performed/Achieved Goal (Y/N): No  AM-PAC - Basic Mobility:    Flowsheet Row Most Recent Value  AM-PAC 6-Clicks - Basic Mobility  Turning from you back to your side while in a flat bed without using bed rails? None  Moving from lying on your back to sitting on the side of a flat bed without using bed rails? None  Moving to and from a bed to a chair (including a wheelchair)? None  Standing up from a chair using your arms (e.g, wheelchair, or bedside chair)? None  To walk in a hospital room? A little  Climbing 3-5 steps with a railing? A little  AM-PAC Total Score 22   PT PLAN OF CARE  Rehab Potential:  Complexity/Co-morbidities that Impact POC: Time since onset/Exacerbation Impairments/Limitations: Activity Tolerance Deficits,  Ambulation Deficits, Balance Deficits, Mobility deficits, Muscle weakness, Transfer deficits, Pain limiting function  Plan:  Planned Treatment/Interventions: Patient/Caregiver education, Therapeutic activity, Therapeutic exercise, Gait/mobility training, Neuromuscular re-education PT Frequency: 5x week PT Duration: For 2 weeks PT Re-eval Due: 06/24/24  PT Goals:  Encounter Problems     Encounter Problems (Active)     Physical Therapy     Patient will ambulate 100 feet Independently using No assistive device (Progressing)     Start:  06/10/24    Expected End:  06/24/24         Patient will go up/down 15 stairs Independently using unilateral rails     Start:  06/10/24    Expected End:  06/24/24                 TREATMENT TIME  Time In: 1340 Time Out: 1359  Total Treatment Minutes: 19 min Timed Charges and Total Timed Code Treatment Minutes:  Therapeutic Activity minutes: 8 PT Timed Minutes: 8   Electronically Signed: Almarie Camp, PT Date: 06/10/2024       [1] Past Medical History: Diagnosis Date  . Alcoholic cirrhosis    (CMD)   . Ascites of liver 08/03/2022  . COPD (chronic obstructive pulmonary disease) 08/06/2021   Last Assessment & Plan:   Formatting of this note might be different from the original.  Stable.  -Resume home regimen  . Esophageal varices 09/02/2022  . Fibromyalgia 10/20/2022  . GERD (gastroesophageal reflux disease) 08/03/2022  . History of hepatitis C post treatment with cure 02/22/2022  . Hypertension 08/06/2021   Last Assessment & Plan:   Formatting of this note might be different from the original.  Hypotensive, systolic down  to 97.  -Hold lisinopril  5 mg, Lasix  40 mg and spironolactone  100 for now  . Hypothyroidism 02/12/2014  . Irritable bowel syndrome (IBS) 08/03/2021  . Other cirrhosis of liver 08/03/2021   Last Assessment & Plan:   Formatting of this note might be different from the original.  Alcoholic liver  cirrhosis with ascites.  Abdomen distended.  Has not had paracentesis in the past.  Tmax 99.1.  WBC 6.6.  Influenza and COVID test negative.  INR - 2.1.  With thrombocytopenia of 71.  T. bili 6.1.  -Ultrasound-guided paracentesis in a.m.  - Ascitic Fluid analysis  -Continue 2 g ceftriaxone   . SBP (spontaneous bacterial peritonitis)    (CMD) 02/27/2023  [2] Past Surgical History: Procedure Laterality Date  . BACK SURGERY    . CARDIAC ELECTROPHYSIOLOGY PROCEDURE N/A 01/20/2024   Leadless pacemaker implant performed by Fredia Marden Fairly, MD at [facility]  . CESAREAN SECTION    . CHOLECYSTECTOMY    . COLONOSCOPY  2016  . ESOPHAGOGASTRODUODENOSCOPY  08/2019  . HEMORROIDECTOMY    . LIVER TRANSPLANTATION N/A 12/17/2023   TRANSPLANT LIVER  [id number]  [id number] performed by Aloysius Donnamarie Overland, MD at [facility] OR  . PARTIAL HYSTERECTOMY     1 ovary remaining.  SABRA RECTAL PROLAPSE REPAIR    . WRIST SURGERY    [3] Patient Active Problem List Diagnosis  . Alcoholism in remission    (CMD)  . COPD (chronic obstructive pulmonary disease)  . Other cirrhosis of liver  . Esophageal varices  . Fibromyalgia  . GERD (gastroesophageal reflux disease)  . Hypothyroidism  . Irritable bowel syndrome (IBS)  . Hepatic encephalopathy    (CMD)  . Moderate protein-calorie malnutrition (CMD)  . Ascites of liver  . S/P liver transplant    (CMD)  . Hypoxia  . Pleural effusion  . CKD (chronic kidney disease)  . Neutropenia

## 2024-06-10 NOTE — ED Notes (Signed)
 Daughter called & is aware pt is being transported at this time.

## 2024-06-10 NOTE — Care Plan (Signed)
  Problem: Risk for Fall-Moderate-IP Goal: Patient will remain free of falls during hospital stay-IP Outcome: Progressing Intervention: Universal Fall Precautions Interventions-IP Recent Flowsheet Documentation Taken 06/10/2024 0133 by Paralee Potters, RN HD IP Universal Fall Precautions Interventions: . Call light/belongings in reach . Bed in low position and locked . Wheelchairs and chairs locked . Siderails up X2 . Clutter free and spill free environment . Educate to the purpose of universal fall precautions . Educate to call for assistance . Keep closet and bathroom doors closed when not in use . Ensure adequate lighting Intervention: Moderate Fall Precautions Interventions-IP Recent Flowsheet Documentation Taken 06/10/2024 0133 by Paralee Potters, RN HD IP Moderate Fall Risk Interventions (11-14): Follow universal fall precautions

## 2024-06-11 ENCOUNTER — Telehealth (HOSPITAL_COMMUNITY): Payer: Self-pay | Admitting: Emergency Medicine

## 2024-06-11 DIAGNOSIS — A040 Enteropathogenic Escherichia coli infection: Secondary | ICD-10-CM | POA: Diagnosis not present

## 2024-06-11 DIAGNOSIS — K149 Disease of tongue, unspecified: Secondary | ICD-10-CM | POA: Diagnosis not present

## 2024-06-11 DIAGNOSIS — D849 Immunodeficiency, unspecified: Secondary | ICD-10-CM | POA: Diagnosis not present

## 2024-06-11 DIAGNOSIS — N179 Acute kidney failure, unspecified: Secondary | ICD-10-CM | POA: Diagnosis not present

## 2024-06-11 DIAGNOSIS — R519 Headache, unspecified: Secondary | ICD-10-CM | POA: Diagnosis not present

## 2024-06-11 DIAGNOSIS — J90 Pleural effusion, not elsewhere classified: Secondary | ICD-10-CM | POA: Diagnosis not present

## 2024-06-11 DIAGNOSIS — Z944 Liver transplant status: Secondary | ICD-10-CM | POA: Diagnosis not present

## 2024-06-11 DIAGNOSIS — Z9889 Other specified postprocedural states: Secondary | ICD-10-CM | POA: Diagnosis not present

## 2024-06-11 DIAGNOSIS — D702 Other drug-induced agranulocytosis: Secondary | ICD-10-CM | POA: Diagnosis not present

## 2024-06-11 LAB — GASTROINTESTINAL PANEL BY PCR, STOOL (REPLACES STOOL CULTURE)

## 2024-06-11 LAB — PATHOLOGIST SMEAR REVIEW

## 2024-06-11 NOTE — Telephone Encounter (Signed)
 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 She had been transferred to [facility] in [place] to the Liver Transplant Team. This result was discussed with their communications center to be sent to her treatment team.

## 2024-06-11 NOTE — Telephone Encounter (Signed)
 Copied from CRM #[id number]. Topic: Clinical Concerns - Result Report >> Jun 11, 2024  5:56 AM Hale MATSU wrote: Dr. Carlin Gula [facility] called provider with call report or test result. Followed on call process. If unable to connect; message sent to [facility] Provider pool.  [facility] LIV DIS >> Jun 11, 2024  5:58 AM Hale MATSU wrote: Dr. Carlin Gula [facility] is calling other request    Include all details related to the request(s) below: Hi, This is your answering service. Below is your message.     Caller: Dr. Carlin Gula [facility]     Call back number:  [phone]   Patient: Karla Young   DOB:  01/16/1961   PCP:  Serge PARAS Zamor      Reason for call: Positive GI pathogen panel for [facility]        Please be advised that we are a non-clinical team and are unable to provide clinical advice to patients. If you have any questions or would like to be connected, please call back at [phone].     Confirm and type the Best Contact Number below:  Patient/caller contact number:             [] Home  [] Mobile  [] Work  []  Other   []  Okay to leave a voicemail   Medication List: No current facility-administered medications for this visit. No current outpatient medications on file.  Facility-Administered Medications Ordered in Other Visits:  .  acetaminophen  (TYLENOL ) tablet 650 mg, 650 mg, oral, Q6H PRN, Morene Jama Essex, DO, 650 mg at 06/10/24 2049 .  apixaban (ELIQUIS) tablet 5 mg, 5 mg, oral, BID, Benjamin Lee Robinson, DO, 5 mg at 06/10/24 2046 .  cycloSPORINE  modified (NEORAL ) capsule 100 mg, 100 mg, oral, BID - Transplant, Morene Jama Essex, DO, 100 mg at 06/10/24 1742 .  [Held by provider] dapsone tablet 100 mg, 100 mg, oral, Daily, Morene Jama Essex, DO .  dextrose  (D50W) 50 % injection 12.5 g, 12.5 g, intravenous, PRN, Morene Jama Essex, DO .  dextrose  (GLUTOSE) 40 % oral gel 15 g, 15 g, oral, PRN, Morene Jama Essex, DO .   entecavir (BARACLUDE) tablet 0.5 mg, 0.5 mg, oral, Every Other Day, Morene Jama Essex, DO, 0.5 mg at 06/10/24 0827 .  folic acid  (FOLVITE ) tablet 1 mg, 1 mg, oral, Daily, Morene Jama Essex, DO, 1 mg at 06/10/24 0827 .  hydrOXYzine  (ATARAX ) tablet 25 mg, 25 mg, oral, Q8H PRN, Morene Jama Essex, DO .  levothyroxine  (SYNTHROID ) tablet 112 mcg, 112 mcg, oral, Daily 0600, Morene Jama Essex, DO, 112 mcg at 06/10/24 9392 .  lidocaine  (XYLOCAINE ) 2 % viscous solution 15 mL, 15 mL, Mouth/Throat, Q3H PRN, Cordella DELENA Peers, PA, 15 mL at 06/10/24 1611 .  loperamide  (IMODIUM ) capsule 2 mg, 2 mg, oral, 4x Daily PRN, Cordella DELENA Peers, PA, 2 mg at 06/10/24 1140 .  mirtazapine (REMERON) tablet 15 mg, 15 mg, oral, Nightly, Morene Jama Essex, DO, 15 mg at 06/10/24 2046 .  omeprazole  (PriLOSEC) DR capsule 20 mg, 20 mg, oral, BID, Morene Jama Essex, DO, 20 mg at 06/10/24 1816 .  ondansetron  (ZOFRAN ) injection 4 mg, 4 mg, intravenous, Q6H PRN, Cordella DELENA Peers, PA .  ondansetron  (ZOFRAN -ODT) disintegrating tablet 4 mg, 4 mg, oral, Q4H PRN, Cordella DELENA Peers, PA, 4 mg at 06/10/24 2214 .  traMADoL  (ULTRAM ) tablet 50 mg, 50 mg, oral, Q4H PRN, Anna Victoria Macaluso, PA-C, 50 mg at 06/10/24 2207 .  traZODone  (DESYREL ) tablet 50 mg, 50 mg, oral, At Bedtime PRN, Morene Jama Essex, DO, 50 mg at 06/10/24 2207     Medication Request/Refills: Pharmacy Information (if applicable)   [x]  Not Applicable       []  Pharmacy listed  Send Medication Request to:                                                 []  Pharmacy not listed (added to pharmacy list in Epic) Send Medication Request to:      Listed Pharmacies: [facility] [phone] @ [address] [place], [place] - [address] - PHONE: [phone] - FAX: [phone] [facility] [place] - [address] - PHONE: [phone] [place]: [phone] [facility] [address] [place]  - [address] - PHONE: [phone] - FAX: [phone] [facility]

## 2024-06-11 NOTE — Telephone Encounter (Signed)
 Dr Lang confirmed [place]

## 2024-06-12 DIAGNOSIS — D702 Other drug-induced agranulocytosis: Secondary | ICD-10-CM | POA: Diagnosis not present

## 2024-06-12 DIAGNOSIS — N179 Acute kidney failure, unspecified: Secondary | ICD-10-CM | POA: Diagnosis not present

## 2024-06-12 DIAGNOSIS — D849 Immunodeficiency, unspecified: Secondary | ICD-10-CM | POA: Diagnosis not present

## 2024-06-12 DIAGNOSIS — R059 Cough, unspecified: Secondary | ICD-10-CM | POA: Diagnosis not present

## 2024-06-12 DIAGNOSIS — Z944 Liver transplant status: Secondary | ICD-10-CM | POA: Diagnosis not present

## 2024-06-12 DIAGNOSIS — K1379 Other lesions of oral mucosa: Secondary | ICD-10-CM | POA: Diagnosis not present

## 2024-06-12 DIAGNOSIS — A040 Enteropathogenic Escherichia coli infection: Secondary | ICD-10-CM | POA: Diagnosis not present

## 2024-06-13 DIAGNOSIS — A040 Enteropathogenic Escherichia coli infection: Secondary | ICD-10-CM | POA: Diagnosis not present

## 2024-06-13 DIAGNOSIS — N179 Acute kidney failure, unspecified: Secondary | ICD-10-CM | POA: Diagnosis not present

## 2024-06-13 DIAGNOSIS — D849 Immunodeficiency, unspecified: Secondary | ICD-10-CM | POA: Diagnosis not present

## 2024-06-13 DIAGNOSIS — K1379 Other lesions of oral mucosa: Secondary | ICD-10-CM | POA: Diagnosis not present

## 2024-06-13 DIAGNOSIS — D702 Other drug-induced agranulocytosis: Secondary | ICD-10-CM | POA: Diagnosis not present

## 2024-06-13 DIAGNOSIS — R059 Cough, unspecified: Secondary | ICD-10-CM | POA: Diagnosis not present

## 2024-06-13 DIAGNOSIS — Z944 Liver transplant status: Secondary | ICD-10-CM | POA: Diagnosis not present

## 2024-06-13 NOTE — Care Plan (Signed)
  Problem: Risk for Fall-Moderate-IP Goal: Patient will remain free of falls during hospital stay-IP Outcome: Progressing   Problem: Safety - Adult Goal: Free from fall injury Description: INTERVENTIONS: 1. Assess pt frequently for physical needs 2. Identify cognitive and physical deficits and behaviors that affect risk of falls. 3. Institute fall precautions as indicated by assessment. 4. Educate pt/family on patient safety including physical limitations 5. Instruct pt to call for assistance with activity based on assessment 6. Modify environment to reduce risk of injury 7. Consider OT/PT consult to assist with strengthening/mobility Outcome: Progressing Goal: Absence of infection during hospitalization Description: INTERVENTIONS: 1. Assess and monitor for signs and symptoms of infection 2. Monitor lab/diagnostic results 3. Monitor all insertion sites i.e., indwelling lines, tubes and drains 4. Monitor endotracheal (as able) and nasal secretions for changes in amount and color 5. Institute appropriate cooling/warming therapies per order 6. Administer medications as ordered 7. Instruct and encourage patient and family to use good hand hygiene technique 8. Identify and instruct in appropriate isolation precautions for identified infection/condition Outcome: Progressing   Problem: INFECTION - ADULT Goal: Absence of infection during hospitalization Description: INTERVENTIONS: 1. Assess and monitor for signs and symptoms of infection 2. Monitor lab/diagnostic results 3. Monitor all insertion sites i.e., indwelling lines, tubes and drains 4. Monitor endotracheal (as able) and nasal secretions for changes in amount and color 5. Institute appropriate cooling/warming therapies per order 6. Administer medications as ordered 7. Instruct and encourage patient and family to use good hand hygiene technique 8. Identify and instruct in appropriate isolation precautions for identified  infection/condition Outcome: Progressing Goal: Absence of fever/infection during anticipated neutropenic period Description: INTERVENTIONS 1. Monitor WBC 2. Administer growth factors as ordered 3. Implement neutropenic guidelines as ordered Outcome: Progressing

## 2024-06-13 NOTE — Progress Notes (Signed)
 Infectious Diseases Progress Note   Name:  Karla Young DOB:  1961-05-04 MRN:  [id number] HAR:  [id number] DATE:  06/13/2024  TIME:  11:35 AM   Subjective:   Patient seen for diarrhea. But now the diarrhea has resolved.  No abdominal pain.  She still has some cough.  On room air.  No chest pain.  Stool for C. difficile toxin negative but PCR positive.  Has some leukocytosis.   REVIEW OF SYSTEM:  As mentioned above otherwise review of system is negative  Objective:   Vital signs in last 24 hours: Temp:  [98.4 F (36.9 C)-99 F (37.2 C)] 98.8 F (37.1 C) Heart Rate:  [69-96] 72 Resp:  [16-18] 16 BP: (116-125)/(61-64) 125/64 Temp (24hrs), Avg:98.8 F (37.1 C), Min:98.4 F (36.9 C), Max:99 F (37.2 C)  SpO2: 91 % Weight: 65.6 kg (144 lb 10 oz) I/O last 3 completed shifts: In: 2510.6 [P.O.:592; I.V.:1918.6] Out: 0   PHYSICAL EXAM  General: Alert, awake and oriented; Not in acute distress HEENT-mild soreness in the tongue Chest: Clear b/l CVS: S1 and S2 normal Abd: Soft, no guarding or rigidity Neuro: Pt can move extremities   Allergies:  Maxipime  [cefepime ]    Medications Current Medications[1]    LABORATORY STUDIES:   Results from last 7 days  Lab Units 06/13/24 0523 06/12/24 0624  WHITE BLOOD CELL COUNT 10*3/uL 21.72* 19.26*  HEMOGLOBIN g/dL 7.9* 8.4*  HEMATOCRIT % 25* 26*  PLATELET COUNT 10*3/uL 188 190    Results from last 7 days  Lab Units 06/13/24 0523 06/12/24 0624  SODIUM mmol/L 140 134  POTASSIUM mmol/L 4.4 4.3  CHLORIDE mmol/L 114* 107  CO2 mmol/L 20* 20*  BUN mg/dL 16 21  CREATININE mg/dL 8.98* 8.80*  GLUCOSE mg/dL 95 81  CALCIUM mg/dL 8.6 8.9   Lab Results  Component Value Date   ALT 9 06/13/2024   ALT 10 06/12/2024   AST 18 06/13/2024   AST 15 06/12/2024   GGT 24 03/12/2024   GGT 33 02/20/2024   BILITOT 0.4 06/13/2024   BILITOT 0.6 06/12/2024   ALBUMIN  2.8 (L) 06/13/2024   ALBUMIN  3.0 (L) 06/12/2024           Assessment/Plan:   63 year old female 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  Now found to have enteropathogenic E. coli on multiplex stool PCR panel   Enteropathogenic E. coli related diarrhea.  Patient patient had diarrhea for 2 to 3 weeks.  She was started on ceftriaxone  on 11/3 and coincidentally the following day diarrhea resolved.  She has received 2 doses of ceftriaxone .  Will stop ceftriaxone  now  Positive C. difficile PCR-C. difficile assay was done due to diarrhea.  Toxin negative but PCR positive but she no longer has diarrhea.  At this time we will continue watching. Will stop ceftriaxone    Leukocytosis.  ? due to filgrastim.  Patient was neutropenic and received filgrastim.  Myfortic on hold.  This likely related to C. difficile  Cough: CT chest showed small amount of patchy airspace opacity in bilateral lower lobes of unclear significance.  RPP pending.  Patient on room air.  Status post liver transplant 12/17/2023.  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 acetaminophen  (TYLENOL ) tablet 650 mg  650 mg oral Q6H PRN Morene Jama Essex, DO   650 mg at 06/10/24 2049  . apixaban (ELIQUIS) tablet 5 mg  5 mg oral BID Morene Jama Essex, DO   5 mg at 06/13/24 9160  . cycloSPORINE  modified (NEORAL ) capsule 75 mg  75 mg oral BID - Transplant Andrew S Delemos, MD   75 mg at 06/13/24 0536  . [Held by provider] dapsone tablet 100 mg  100 mg oral Daily Morene Jama Essex, DO      . dextrose  (D50W) 50 % injection 12.5 g  12.5 g intravenous PRN Morene Jama Essex, DO      . dextrose  (GLUTOSE) 40 % oral gel 15 g  15 g oral PRN Morene Jama Essex, DO      . entecavir (BARACLUDE) tablet 0.5 mg  0.5 mg oral Every Other Day Morene Jama Essex, DO   0.5 mg at 06/12/24 0946  . folic acid  (FOLVITE ) tablet 1 mg  1 mg oral Daily Morene Jama Essex, DO   1 mg at 06/13/24 9160  . hydrOXYzine  (ATARAX ) tablet 25 mg  25 mg oral Q8H PRN Morene Jama Essex, DO      . levothyroxine  (SYNTHROID ) tablet 112 mcg  112 mcg oral Daily 0600 Morene Jama Essex, DO   112 mcg at 06/13/24 0536  . lidocaine  (XYLOCAINE ) 2 % viscous solution 15 mL  15 mL Mouth/Throat Q3H PRN Cordella DELENA Peers, PA   15 mL at 06/10/24 1611  . loperamide  (IMODIUM ) capsule 2 mg  2 mg oral 4x Daily PRN Cordella DELENA Peers, PA   2 mg at 06/10/24 1140  . mirtazapine (REMERON) tablet 15 mg  15 mg oral Nightly Morene Jama Essex, DO   15 mg at 06/12/24 2303  . nystatin  (MYCOSTATIN ) 100,000 unit/mL suspension 500,000 Units  500,000 Units oral 4x Daily Ruel Ko, MD   500,000 Units at 06/13/24 0839  . omeprazole  (PriLOSEC) DR capsule 20 mg  20 mg oral BID Morene Jama Essex, DO   20 mg at 06/13/24 0536  . ondansetron  (ZOFRAN ) injection 4 mg  4 mg intravenous Q6H PRN Cordella DELENA Peers, PA   4 mg at 06/11/24 0933  . ondansetron  (ZOFRAN -ODT) disintegrating tablet 4 mg  4 mg oral Q4H PRN Cordella DELENA Peers, PA   4 mg at 06/10/24 2214  . promethazine (PHENERGAN) tablet 12.5 mg  12.5 mg oral Q6H [facility] Ellen Petryna, MD   12.5 mg at 06/13/24 0536  . [Held by provider] sodium chloride  0.9 % infusion  75 mL/hr intravenous Continuous Leeroy Gamble, MD 75 mL/hr at 06/13/24 0538 75 mL/hr at 06/13/24 0538  . SUMAtriptan (IMITREX) tablet 25 mg  25 mg oral Q2H PRN Ellen Petryna, MD   25 mg at 06/11/24 2113  . traMADoL  (ULTRAM ) tablet 50 mg  50 mg oral Q4H PRN Anna Victoria Macaluso, PA-C   50 mg at 06/11/24 1010  . traZODone  (DESYREL ) tablet 50 mg  50 mg oral At Bedtime PRN Morene Jama Essex, DO   50 mg  at 06/12/24 2308  . valACYclovir (VALTREX) tablet 1,000 mg  1,000 mg oral BID Kiran Gajurel, MD   1,000 mg at 06/13/24 [phone]

## 2024-06-13 NOTE — Progress Notes (Signed)
 Case Management Interim Assessment Note Patient Information: DOB:1961/05/22 Gender:female Admission Date: 06/10/2024 Primary Care Provider: Leta KATHEE Fear, MD Preferred Pharmacy: [facility] [phone] @ [address] [place], [place] - [address] - PHONE: [phone] [place]: [phone]   Unplanned Readmission Score:  23.34  Discharge Barriers Discharge Barriers Identified  : No Barriers Identified  Narrative Anticipated Discharge Plan:  CCM visited with Pt at bedside along with Transplant SW Abby W. The group discussed d/c plan. PT/OT recs for home with close supervision. Abby asked Pt if she would be willing to discharge to her daughter [name]'s home. Pt advised that she was open to that. Abby and Pt also talked about coping skills for depressive episodes. CCM inquired if Pt would be open to HHPT for additional supports, Pt declined. Pt inquired about discharging today as her daughter would not be able to transport her home tomorrow. CCM to follow up with treatment team.   Pt will discharge to the following address: [address] [place], [address].     Anticipated Discharge Location: Home  If Plan A discharging location is not feasible: Potential Plan B: Home  Any concerns with discharge plan: No concerns verbalized with current anticipated discharge plan Date: Wed 06/13/2024  Patient's level of function on discharge is expected to return to prior level of function.   Discharge Disposition: Home or Self Care  Assessment Completed by: Maybell Honour, MSW

## 2024-06-13 NOTE — Discharge Summary (Signed)
 ------------------------------------------------------------------------------- Attestation signed by Prentice GORMAN Bobo, MD at 06/13/2024  5:24 PM Hepatology attending addendum: I have seen, discussed, and examined the patient with the GI fellow.  I directly contributed to the formulation of the assessment plan.  She feels better and would like to go home.  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  We spoke to pulmonary about a as needed inhaler for COPD.  Her neutropenia resolved with G-CSF therapy.  Her headaches also resolved and she tolerated a diet without diarrhea so we did not think she had C. difficile or an EPEC infection.  We lowered her cyclosporine  to 50 mg twice a day.  She should have labs next week and close follow-up with the transplant center. -------------------------------------------------------------------------------  Discharge Summary   PATIENT NAME:  Karla Joynt DOB:  [birth year] MRN:  [id number]   Admission Date:   06/10/2024  1:39 AM                      Attending: Prentice GORMAN Bobo, MD   Discharge Date:   06/13/2024              Discharging: Leeroy Gamble, MD   Consultants: Infectious disease Palliative Care  DISCHARGE DIAGNOSIS: Principal Problem (Resolved):   Neutropenia Active Problems: There are no active Hospital Problems.  Hospital Course: Holliday Sheaffer is a 63 y.o. 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   Patient presented to outside hospital for constellation of diarrhea, weakness over the last 3 weeks.  Diarrhea characterized by watery bowel movements at least 5 bowel movements daily for at least 3  weeks.  She has been advised to discontinue Myfortic and magnesium  which she states that she has been off of for the last 2 weeks ago her diarrhea symptoms continue.  She has had poor p.o. intake due to reported sores in her mouth making swallowing painful.  She has been weak lately and remarkably fatigued.  No nausea or vomiting.  No abdominal pain or abdominal distention.  No lower extremity edema.  She endorses a chronic cough, baseline shortness of breath, she is not on oxygen  at home though she was placed on oxygen  at the outside hospital.  She remains compliant with her immunosuppression regimen and is cyclosporine  100 mg daily.  No rashes on her body.  No burning or discomfort with urination or other urinary symptoms.  She recently found to have neutropenia and for this reason was prescribed a CSF into the first dose on 11/1 after receiving it in the mail.   Workup at outside hospital demonstrated acute kidney injury on CKD with a serum creatinine of 2.27 up from creatinine on 10/27, her ANC was 0.0, she is found to be afebrile she was given broad-spectrum antibiotics at the outside hospital.  C. difficile was checked and it was negative.  CT abdomen pelvis single phase obtained without features of bowel obstruction or colitis.  UA without evidence of infection.  Trace upper abdominal ascites noted on CT, small pleural effusion observed on chest x-ray.  She was transferred to [facility] for hepatology admission.  Patient was given Neupogen on 11/1 - 11/3 with improvement in ANC and  WBC counts - with Myfortic held and Dapsone held. She was worked up for diarrhea, and found to have EPEC and positive c diff toxin; with resolution of her diarrheal symptoms. She developed headache, and Imitrex helped to relieve her symptoms. Infectious disease was consulted to assist with work-up. Her cyclosporine  levels were monitored and her dose was decreased to 50 mg BID. Additionally chest CT obtained showing:  1. Small  amount of patchy airspace opacity bilateral lower lobes may reflect pneumonia. Right lower lobe bronchial wall thickening. 2. Small right pleural effusion. 3. Changes of liver transplant. Splenomegaly. Small amount of free fluid in the upper abdomen. She was continued on Rocephin , and discussed transitioning to oral medication with cefpodoxime for completion of treatment.   Discussed case with Pulmonology who recommended follow-up with General Pulmonology with pulmonary function studies and discharge with albuterol  for wheezing. Patient deemed medically ready for discharge today. Discussed with patient's daughter who will come to pick her up. After PT, therapy recommended discharge home with intermittent assist. Patient willing and daughter willing to have her stay.   Discharge Medications:   Medication List     PAUSE taking these medications    * cycloSPORINE  modified 100 mg capsule Wait to take this until your doctor or other care provider tells you to start again. Commonly known as: NEORAL  Take 1 capsule (100 mg total) by mouth 2 (two) times a day. You also have another medication with the same name that you may need to continue taking.   dapsone 100 mg tablet Wait to take this until your doctor or other care provider tells you to start again. Take 1 tablet (100 mg total) by mouth daily.   magnesium  oxide 400 mg (241 mg magnesium ) Tab Wait to take this until your doctor or other care provider tells you to start again. Take 1 tablet by mouth 2 times a day. (Take 1 tablet (400 mg total) by mouth 2 (two) times a day.)   mycophenolate 360 mg Tbec DR tablet Wait to take this until your doctor or other care provider tells you to start again. Commonly known as: MYFORTIC Take 2 tablets (720 mg total) by mouth 2 (two) times a day.      * * This list has 1 medication(s) that are the same as other medications prescribed for you. Read the directions carefully, and ask your doctor or other  care provider to review them with you.          START taking these medications    albuterol  HFA 90 mcg/actuation inhaler Commonly known as: PROVENTIL  HFA;VENTOLIN  HFA;PROAIR  HFA Inhale 2 puffs every 6 (six) hours as needed for wheezing.   cefPODOXime 200 mg tablet Commonly known as: VANTIN Take 2 tablets (400 mg total) by mouth every 12 (twelve) hours for 4 days.   ferrous sulfate 325 mg (65 mg iron) tablet Take 1 tablet (325 mg total) by mouth daily before breakfast.   folic acid  1 mg tablet Commonly known as: FOLVITE  Take 1 tablet (1 mg total) by mouth daily. Start taking on: June 14, 2024   loperamide  2 mg capsule Commonly known as: IMODIUM  Take 1 capsule (2 mg total) by mouth 4 (four) times a day as needed for diarrhea.   valACYclovir 1 gram tablet Commonly known as: VALTREX Take 1 tablet (1,000 mg total) by mouth 2 (two) times a day for 7 days.       CHANGE how you take these medications    * cycloSPORINE  modified  25 mg capsule Commonly known as: NEORAL  Take 2 capsules (50 mg total) by mouth 2 (two) times a day. What changed: Another medication with the same name was paused. Ask your nurse or doctor if you should take this medication.      * * This list has 1 medication(s) that are the same as other medications prescribed for you. Read the directions carefully, and ask your doctor or other care provider to review them with you.          CONTINUE taking these medications    acetaminophen  325 mg tablet Commonly known as: TYLENOL  Take 650 mg by mouth as needed for mild pain (1-3) or moderate pain (4-6).   apixaban 5 mg Tab Commonly known as: ELIQUIS Take 1 tablet (5 mg total) by mouth 2 (two) times a day.   entecavir 0.5 mg Tab tablet Commonly known as: BARACLUDE Take 1 tablet (0.5 mg total) by mouth every other day.   hydrOXYzine  25 mg tablet Commonly known as: ATARAX  Take 25 mg by mouth daily as needed for anxiety.   ipratropium 42 mcg (0.06  %) Spry nasal spray Commonly known as: ATROVENT Administer 2 sprays into each nostril 2 (two) times a day as needed (nasal drip).   levothyroxine  112 mcg tablet Commonly known as: SYNTHROID  Take 1 tablet (112 mcg total) by mouth in the morning.   mirtazapine 15 mg tablet Commonly known as: REMERON TAKE 1 TABLET BY MOUTH ONCE DAILY AT BEDTIME   pantoprazole  40 mg EC tablet Commonly known as: PROTONIX  Take 40 mg by mouth 2 (two) times a day.   traZODone  50 mg tablet Commonly known as: DESYREL  TAKE 1 TABLET BY MOUTH EVERY DAY AT BEDTIME AS NEEDED FOR SLEEP         Where to Get Your Medications     These medications were sent to [id number] [place], [place] - [address] - PHONE: [phone] - FAX: [phone]  [address], [place] [address]    Phone: [phone]  albuterol  HFA 90 mcg/actuation inhaler cefPODOXime 200 mg tablet cycloSPORINE  modified 25 mg capsule ferrous sulfate 325 mg (65 mg iron) tablet folic acid  1 mg tablet loperamide  2 mg capsule valACYclovir 1 gram tablet     Follow Up Appointments: Future Appointments  Date Time Provider Department Center  06/26/2024  1:00 PM Philippe J Zamor, MD [facility] None  06/26/2024  1:30 PM Lacey JONELLE Cleveland, RD [facility] TRAN CTR None  06/26/2024  2:00 PM Nejat Ahmed, RN [facility] LIV DIS MMP  07/20/2024  9:30 AM [facility] CARD MRI1 [facility] CAR MR [facility]  07/20/2024 10:30 AM [facility] CT21 [facility] CAR CT [facility]  09/12/2024  9:00 AM Sherlean Gwynda Servant, MD [facility] PUL CH None  09/18/2024 10:00 AM Philippe J Zamor, MD CLIV None  09/24/2024  1:45 PM Nejat Ahmed, RN [facility] LIV DIS MMP  11/01/2024  3:00 PM Tinnie LELON Frederick, PA-C [facility] HONC C3 [facility] Comp Can  12/17/2024  1:40 PM Serge JINNY Cedar, MD [facility] LIV DIS MMP  12/17/2024  1:45 PM Nejat Ahmed, RN [facility] LIV DIS MMP  12/17/2024  2:00 PM Casandra R Stidham, RD [facility] TRAN CTR None    OBJECTIVE: BP 145/69   Pulse 71   Temp 98.8 F (37.1 C) (Oral)   Resp 17   Ht 1.575 m (5'  2.01)   Wt 65.6 kg (144 lb 10 oz)   SpO2 99%   BMI 26.44 kg/m   General: Alert, Oriented x  3. In no acute distress Eye: Sclera anicteric. EOMI HEENT: Moist oral mucosa; Carey/AT Respiratory: Bilateral wheeze in upper lungs, decreased breath sounds in right lower base Cardiovascular: Normal rate. Regular rhythm. S1S2. No edema. Gastrointestinal: Abdomen non-distended, normal bowel sounds. Soft, non-tender to palpation. No ascites Musculoskeletal: Moves all extremities.  Integumentary: Warm. Dry. No rash.  Neurologic: Moving extremities; normal facies Psychiatric: Cooperative. Appropriate mood & affect. Normal judgment.    Laboratory Data: Recent Labs    06/13/24 0523 06/12/24 0624 06/11/24 0146  WHITE BLOOD CELL COUNT 21.72* 19.26* 6.16  HEMOGLOBIN 7.9* 8.4* 7.3*  HEMATOCRIT 25* 26* 22*  PLATELET COUNT 188 190 136*    Recent Labs    06/13/24 0523 06/12/24 0624 06/11/24 0146  SODIUM 140 134 134  POTASSIUM 4.4 4.3 4.3  CHLORIDE 114* 107 108*  CO2 20* 20* 18*  BUN 16 21 30   CREATININE 1.01* 1.19* 1.31*  GLUCOSE 95 81 87  CALCIUM 8.6 8.9 9.0        Recent Labs    06/13/24 0523 06/12/24 0624 06/11/24 0146  AST 18 15 27   ALT 9 10 13   BILIRUBIN TOTAL 0.4 0.6 1.5*  TOTAL PROTEIN 4.7* 4.9* 4.9*  ALBUMIN  2.8* 3.0* 2.9*  ALP 93 99 90     Results for orders placed or performed during the hospital encounter of 06/10/24 (from the past week)  HSV 1 & 2 Quantitative Real-time PCR (Plasma)   Specimen: Venous; Blood  Result Value Ref Range   HSV 1 (Plasma) Not Detected Not Detected copies/mL   HSV 2 (Plasma) Not Detected Not Detected copies/mL  Blood Culture, 1st Set   Specimen: Venous; Blood  Result Value Ref Range   Blood Culture No growth after 3 days.   Blood Culture, 2nd Set   Specimen: Venous; Blood  Result Value Ref Range   Blood Culture No growth after 3 days.   Gastrointestinal Pathogen Profile, Stool, NAAT   Specimen: Stool, Per Rectum  Result Value Ref  Range   Campylobacter PCR Not Detected Not Detected   Plesiomonas shigelloides PCR Not Detected Not Detected   Salmonella PCR Not Detected Not Detected   Vibrio PCR Not Detected Not Detected   Vibrio cholerae PCR Not Detected Not Detected   Yersinia enterocolitica PCR Not Detected Not Detected   Enteroaggregative E coli PCR Not Detected Not Detected   Enteropathogenic E coli PCR Detected (A) Not Detected   Enterotoxigenic E coli PCR Not Detected Not Detected   Shiga-Toxin-Producing E coli PCR Not Detected Not Detected   Shigella/Enteroinvasive E coli PCR Not Detected Not Detected   Cryptosporidium PCR Not Detected Not Detected   Cyclospora cayetanensis PCR Not Detected Not Detected   Entamoeba histolytica PCR Not Detected Not Detected   Giardia lamblia PCR Not Detected Not Detected   Adenovirus F 40/41 PCR Not Detected Not Detected   Astrovirus PCR Not Detected Not Detected   Norovirus GI/GII PCR Not Detected Not Detected   Rotavirus A PCR Not Detected Not Detected   Sapovirus PCR Not Detected Not Detected  Clostridioides difficile Antigen and Toxin EIA   Specimen: Stool, Per Rectum  Result Value Ref Range   Clostridium difficle Antigen/Toxin Interpretation  Clostridioides difficile antigen negative    Indeterminate. Specimen reflexed to PCR for definitive testing.   C. Diff. Toxin A/B EIA Clostridioides difficile toxin A/B negative Clostridioides difficile toxin A/B negative   Clostridium difficile antigens Clostridioides difficile antigen positive (A) Clostridioides difficile antigen negative    Imaging: CT Chest WO  Contrast Result Date: 06/11/2024 1. Small amount of patchy airspace opacity bilateral lower lobes may reflect pneumonia. Right lower lobe bronchial wall thickening. 2. Small right pleural effusion. 3. Changes of liver transplant. Splenomegaly. Small amount of free fluid in the upper abdomen. THIS IS AN ELECTRONICALLY VERIFIED FINAL REPORT 06/11/2024 10:40 PM -  Electronically signed by Newell Mais Workstation: [id number] [facility]  CT Head WO Contrast Result Date: 06/11/2024 Mild generalized volume loss and chronic microvascular ischemic changes. No CT findings of acute infarct, intracranial hemorrhage, or mass. THIS IS AN ELECTRONICALLY VERIFIED FINAL REPORT 06/11/2024 9:27 PM - Electronically signed by Redell Beck Workstation: [id number] [facility]    Condition on Discharge: Good Discharge Disposition: Discharged to: To daughter's home   [place] Young is a 63 y.o. 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   Neutropenia - Will continue to hold Myfortic, hold dapsone; Continue lower dose of cyclosporine  50 mg BID for now - Blood cultures no growth @ 3 days - B12 698, Iron 40, Tsat 19% - iron supplementation ordered at discharge. To start in 1 week.  - Status post 3 doses of Neupogen on 11/1, 11/2, 11/3; improvement in WBC and ANC   Nausea; Resolved - Scheduled phenergan, PRN zofran ; QTc 417 - Appreciate nutrition assistance    Cough/ Mild Pneumonia - CT chest wo contrast ordered; CT chest showed small amount of patchy airspace opacity in bilateral lower lobes.  Unclear whether this is true bacterial pneumonia.  Patient on ceftriaxone   - Transplant ID consulted; Finish course of antibiotics with 4 days of cefpodoxime   Acute kidney injury on CKD - Improved    Acute diarrhea; Resolved - C. difficile negative 11/1; GPP positive with Enteropathogenic E. Coli - C. 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

## 2024-06-13 NOTE — Progress Notes (Signed)
 Met pt at bedside w CCM. Agreeable to dc to dgtr's home. Would be more convenient to dc this day. Pt open to considering engagement in talk therapy. No additional needs identified. MSW will follow as needed.

## 2024-06-13 NOTE — Telephone Encounter (Signed)
 Called pt dgtr. No family or friends available to supervise pt in her home. Dgtr open to pt discharging to her home but thinks pt may resist. Dgtr reports pt was doing well until recent bouts of diarrhea. Dgtr does not think pt is drinking or using drugs as she is grateful for her second chance. Dgtr reports pt suffers frp, life long depression and is experience this currently. She reports pt has tried multiple SSRI's in the past but they make her feel funny and pt is resistant with talk therapy. Dgtr has therapist appts available should pt agree. MSW will discuss options w pt and continue to folow.

## 2024-06-14 LAB — CULTURE, BLOOD (ROUTINE X 2)
Culture: NO GROWTH
Culture: NO GROWTH

## 2024-06-14 NOTE — Telephone Encounter (Signed)
 Called daughter to f/u and left VM. Sent portal message to stop valtrex.

## 2024-06-14 NOTE — Telephone Encounter (Signed)
-----   Message from Leeroy Gamble, MD sent at 06/14/2024  1:34 PM EST ----- Good afternoon -  Patient was discharged from the hospital yesterday evening 11/5. I just wanted to make you all aware. Also, her HSV PCR came back negative, so I was going to try to give her a call to stop her Valtrex course.   Thank you! Leeroy

## 2024-06-21 ENCOUNTER — Emergency Department (HOSPITAL_COMMUNITY)
Admission: EM | Admit: 2024-06-21 | Discharge: 2024-06-22 | Disposition: A | Discharge status: Other Acute Inpatient Hospital | Attending: Emergency Medicine | Admitting: Emergency Medicine

## 2024-06-21 ENCOUNTER — Emergency Department (HOSPITAL_COMMUNITY)

## 2024-06-21 ENCOUNTER — Other Ambulatory Visit: Payer: Self-pay

## 2024-06-21 ENCOUNTER — Encounter (HOSPITAL_COMMUNITY): Payer: Self-pay | Admitting: Emergency Medicine

## 2024-06-21 DIAGNOSIS — J90 Pleural effusion, not elsewhere classified: Secondary | ICD-10-CM | POA: Diagnosis not present

## 2024-06-21 DIAGNOSIS — Z944 Liver transplant status: Secondary | ICD-10-CM | POA: Insufficient documentation

## 2024-06-21 DIAGNOSIS — K432 Incisional hernia without obstruction or gangrene: Secondary | ICD-10-CM | POA: Diagnosis not present

## 2024-06-21 DIAGNOSIS — Z7901 Long term (current) use of anticoagulants: Secondary | ICD-10-CM | POA: Insufficient documentation

## 2024-06-21 DIAGNOSIS — R161 Splenomegaly, not elsewhere classified: Secondary | ICD-10-CM | POA: Diagnosis not present

## 2024-06-21 DIAGNOSIS — R109 Unspecified abdominal pain: Secondary | ICD-10-CM | POA: Diagnosis present

## 2024-06-21 DIAGNOSIS — K6389 Other specified diseases of intestine: Secondary | ICD-10-CM | POA: Diagnosis not present

## 2024-06-21 DIAGNOSIS — A0472 Enterocolitis due to Clostridium difficile, not specified as recurrent: Secondary | ICD-10-CM | POA: Insufficient documentation

## 2024-06-21 DIAGNOSIS — E860 Dehydration: Secondary | ICD-10-CM | POA: Insufficient documentation

## 2024-06-21 DIAGNOSIS — R0602 Shortness of breath: Secondary | ICD-10-CM | POA: Diagnosis not present

## 2024-06-21 DIAGNOSIS — J9811 Atelectasis: Secondary | ICD-10-CM | POA: Diagnosis not present

## 2024-06-21 LAB — CBC WITH DIFFERENTIAL/PLATELET
Abs Immature Granulocytes: 0.12 K/uL — ABNORMAL HIGH (ref 0.00–0.07)
Basophils Absolute: 0.1 K/uL (ref 0.0–0.1)
Basophils Relative: 1 %
Eosinophils Absolute: 0.2 K/uL (ref 0.0–0.5)
Eosinophils Relative: 2 %
HCT: 25.4 % — ABNORMAL LOW (ref 36.0–46.0)
Hemoglobin: 7.9 g/dL — ABNORMAL LOW (ref 12.0–15.0)
Immature Granulocytes: 1 %
Lymphocytes Relative: 13 %
Lymphs Abs: 1.2 K/uL (ref 0.7–4.0)
MCH: 27 pg (ref 26.0–34.0)
MCHC: 31.1 g/dL (ref 30.0–36.0)
MCV: 86.7 fL (ref 80.0–100.0)
Monocytes Absolute: 0.7 K/uL (ref 0.1–1.0)
Monocytes Relative: 8 %
Neutro Abs: 7 K/uL (ref 1.7–7.7)
Neutrophils Relative %: 75 %
Platelets: 138 K/uL — ABNORMAL LOW (ref 150–400)
RBC: 2.93 MIL/uL — ABNORMAL LOW (ref 3.87–5.11)
RDW: 14.8 % (ref 11.5–15.5)
WBC: 9.2 K/uL (ref 4.0–10.5)
nRBC: 0 % (ref 0.0–0.2)

## 2024-06-21 LAB — COMPREHENSIVE METABOLIC PANEL WITH GFR
ALT: 81 U/L — ABNORMAL HIGH (ref 0–44)
AST: 36 U/L (ref 15–41)
Albumin: 3.1 g/dL — ABNORMAL LOW (ref 3.5–5.0)
Alkaline Phosphatase: 317 U/L — ABNORMAL HIGH (ref 38–126)
Anion gap: 16 — ABNORMAL HIGH (ref 5–15)
BUN: 31 mg/dL — ABNORMAL HIGH (ref 8–23)
CO2: 18 mmol/L — ABNORMAL LOW (ref 22–32)
Calcium: 8.2 mg/dL — ABNORMAL LOW (ref 8.9–10.3)
Chloride: 98 mmol/L (ref 98–111)
Creatinine, Ser: 1.87 mg/dL — ABNORMAL HIGH (ref 0.44–1.00)
GFR, Estimated: 30 mL/min — ABNORMAL LOW (ref >60)
Glucose, Bld: 83 mg/dL (ref 70–99)
Potassium: 3.8 mmol/L (ref 3.5–5.1)
Sodium: 132 mmol/L — ABNORMAL LOW (ref 135–145)
Total Bilirubin: 0.6 mg/dL (ref 0.0–1.2)
Total Protein: 5.8 g/dL — ABNORMAL LOW (ref 6.5–8.1)

## 2024-06-21 LAB — PROTIME-INR
INR: 1.2 (ref 0.8–1.2)
Prothrombin Time: 16.1 s — ABNORMAL HIGH (ref 11.4–15.2)

## 2024-06-21 LAB — C DIFFICILE QUICK SCREEN W PCR REFLEX
C Diff antigen: POSITIVE — AB
C Diff interpretation: DETECTED
C Diff toxin: POSITIVE — AB

## 2024-06-21 LAB — LACTIC ACID, PLASMA
Lactic Acid, Venous: 0.7 mmol/L (ref 0.5–1.9)
Lactic Acid, Venous: 0.9 mmol/L (ref 0.5–1.9)

## 2024-06-21 MED ORDER — FOLIC ACID 1 MG PO TABS
1.0000 mg | ORAL_TABLET | Freq: Every day | ORAL | Status: DC
  Administered 2024-06-21: 1 mg via ORAL
  Filled 2024-06-21: qty 1

## 2024-06-21 MED ORDER — LEVOTHYROXINE SODIUM 50 MCG PO TABS
100.0000 ug | ORAL_TABLET | Freq: Every day | ORAL | Status: DC
  Administered 2024-06-22: 100 ug via ORAL
  Filled 2024-06-21: qty 2

## 2024-06-21 MED ORDER — SODIUM CHLORIDE 0.9 % IV BOLUS
1000.0000 mL | Freq: Once | INTRAVENOUS | Status: AC
  Administered 2024-06-21 (×2): 1000 mL via INTRAVENOUS

## 2024-06-21 MED ORDER — MIRTAZAPINE 15 MG PO TBDP
15.0000 mg | ORAL_TABLET | Freq: Every day | ORAL | Status: DC
  Administered 2024-06-21: 15 mg via ORAL
  Filled 2024-06-21: qty 1

## 2024-06-21 MED ORDER — CYCLOSPORINE MODIFIED (GENGRAF) 25 MG PO CAPS
50.0000 mg | ORAL_CAPSULE | Freq: Two times a day (BID) | ORAL | Status: DC
  Administered 2024-06-21: 50 mg via ORAL
  Filled 2024-06-21 (×2): qty 2

## 2024-06-21 MED ORDER — HYDROXYZINE HCL 25 MG PO TABS
25.0000 mg | ORAL_TABLET | Freq: Every day | ORAL | Status: DC
  Administered 2024-06-21: 25 mg via ORAL
  Filled 2024-06-21: qty 1

## 2024-06-21 MED ORDER — PANTOPRAZOLE SODIUM 40 MG PO TBEC
40.0000 mg | DELAYED_RELEASE_TABLET | Freq: Two times a day (BID) | ORAL | Status: DC
  Administered 2024-06-21: 40 mg via ORAL
  Filled 2024-06-21: qty 1

## 2024-06-21 MED ORDER — CYCLOSPORINE 25 MG PO CAPS
50.0000 mg | ORAL_CAPSULE | Freq: Two times a day (BID) | ORAL | Status: DC
  Filled 2024-06-21 (×3): qty 2

## 2024-06-21 MED ORDER — APIXABAN 5 MG PO TABS
5.0000 mg | ORAL_TABLET | Freq: Two times a day (BID) | ORAL | Status: DC
  Administered 2024-06-21: 5 mg via ORAL
  Filled 2024-06-21: qty 1

## 2024-06-21 MED ORDER — TRAZODONE HCL 50 MG PO TABS
50.0000 mg | ORAL_TABLET | Freq: Every day | ORAL | Status: DC
  Administered 2024-06-21: 50 mg via ORAL
  Filled 2024-06-21: qty 1

## 2024-06-21 MED ORDER — TRAMADOL HCL 50 MG PO TABS
50.0000 mg | ORAL_TABLET | Freq: Once | ORAL | Status: AC
Start: 2024-06-21 — End: 2024-06-21
  Administered 2024-06-21: 50 mg via ORAL
  Filled 2024-06-21: qty 1

## 2024-06-21 MED ORDER — DEXTROSE-SODIUM CHLORIDE 5-0.9 % IV SOLN: Freq: Once | INTRAVENOUS | Status: AC

## 2024-06-21 MED ORDER — FIDAXOMICIN 200 MG PO TABS
200.0000 mg | ORAL_TABLET | Freq: Two times a day (BID) | ORAL | Status: DC
  Administered 2024-06-21: 200 mg via ORAL
  Filled 2024-06-21 (×4): qty 1

## 2024-06-21 MED ORDER — SODIUM CHLORIDE 0.9 % IV BOLUS
2000.0000 mL | Freq: Once | INTRAVENOUS | Status: AC
  Administered 2024-06-21: 2000 mL via INTRAVENOUS

## 2024-06-21 NOTE — ED Notes (Signed)
 Pt was given verbal order to take home meds except blood thinner.

## 2024-06-21 NOTE — ED Provider Notes (Signed)
 [place] EMERGENCY DEPARTMENT AT [facility] Provider Note   CSN: [id number] Arrival date & time: 06/21/24  9340     Patient presents with: Abdominal Pain and Diarrhea   Karla Young is a 63 y.o. female.   Patient has a history of a liver transplant for hepatitis C and alcohol abuse.  She had her transplant done in [place] in May.  Patient had a recent admission for diarrhea and possible pneumonia.  She returns today because she has been having 4-8 stools a day for about 5 days and has had some vomiting last time she vomited was yesterday.  Minor abdominal discomfort  The history is provided by the patient and medical records. No language interpreter was used.  Abdominal Pain Pain location:  Generalized Pain quality: aching   Pain radiates to:  Does not radiate Pain severity:  Mild Onset quality:  Sudden Timing:  Constant Progression:  Waxing and waning Chronicity:  New Context: not alcohol use   Associated symptoms: diarrhea and vomiting   Associated symptoms: no chest pain, no cough, no fatigue and no hematuria   Diarrhea Associated symptoms: abdominal pain and vomiting   Associated symptoms: no headaches        Prior to Admission medications   Medication Sig Start Date End Date Taking? Authorizing Provider  albuterol  (VENTOLIN  HFA) 108 (90 Base) MCG/ACT inhaler Inhale 2 puffs into the lungs every 6 (six) hours as needed for wheezing. 06/13/24 09/11/24 Yes [provider]  cycloSPORINE  modified (NEORAL ) 25 MG capsule Take 50 mg by mouth 2 (two) times daily. 06/13/24 09/11/24 Yes [provider]  ELIQUIS 5 MG TABS tablet Take 5 mg by mouth in the morning.   Yes [provider]  entecavir (BARACLUDE) 0.5 MG tablet Take 0.5 mg by mouth every other day.   Yes [provider]  ferrous sulfate 325 (65 FE) MG tablet Take 325 mg by mouth daily with breakfast. 06/14/24 09/12/24 Yes [provider]  folic acid  (FOLVITE ) 1 MG tablet  Take 1 mg by mouth daily. 06/14/24 09/12/24 Yes [provider]  hydrOXYzine  (ATARAX ) 25 MG tablet Take 1 tablet (25 mg total) by mouth every 8 (eight) hours as needed for anxiety or nausea. 07/23/22  Yes Emokpae, Courage, MD  ipratropium (ATROVENT) 0.06 % nasal spray Place 2 sprays into both nostrils 2 (two) times daily as needed for rhinitis. 05/16/24  Yes [provider]  levothyroxine  (SYNTHROID ) 112 MCG tablet Take 112 mcg by mouth every morning.   Yes [provider]  loperamide  (IMODIUM ) 2 MG capsule Take 2 mg by mouth 4 (four) times daily as needed for diarrhea or loose stools. 06/13/24 06/23/24 Yes [provider]  mirtazapine (REMERON) 15 MG tablet Take 15 mg by mouth at bedtime. 06/01/24  Yes [provider]  NIVESTYM 480 MCG/0.8ML SOSY injection Inject 480 mcg into the skin daily. For 2 days 06/06/24  Yes [provider]  pantoprazole  (PROTONIX ) 40 MG tablet Take 1 tablet (40 mg total) by mouth 2 (two) times daily. 08/24/23  Yes Shirlean Therisa ORN, NP  traZODone  (DESYREL ) 50 MG tablet Take 50 mg by mouth at bedtime. 05/14/24  Yes [provider]  cefpodoxime (VANTIN) 200 MG tablet Take 200 mg by mouth See admin instructions. Take 2 tablets (400 mg) by mouth every 12 hours Patient not taking: Reported on 06/21/2024 06/13/24   [provider]  cycloSPORINE  modified (NEORAL ) 100 MG capsule Take 100 mg by mouth 2 (two) times daily.  Patient not taking: Reported on 06/21/2024    [provider]  dapsone 100 MG tablet Take 100 mg by mouth daily.    [provider]  MAGNESIUM -OXIDE 400 (240 Mg) MG tablet Take 1 tablet by mouth 2 (two) times daily. Patient not taking: Reported on 06/09/2024 05/29/24   [provider]  mycophenolate (MYFORTIC) 360 MG TBEC EC tablet Take 720 mg by mouth 2 (two) times daily. Patient not taking: Reported on 06/09/2024    [provider]  valACYclovir (VALTREX) 1000 MG tablet  Take 1,000 mg by mouth 2 (two) times daily. 06/13/24   [provider]    Allergies: Cefepime     Review of Systems  Constitutional:  Negative for appetite change and fatigue.  HENT:  Negative for congestion, ear discharge and sinus pressure.   Eyes:  Negative for discharge.  Respiratory:  Negative for cough.   Cardiovascular:  Negative for chest pain.  Gastrointestinal:  Positive for abdominal pain, diarrhea and vomiting.  Genitourinary:  Negative for frequency and hematuria.  Musculoskeletal:  Negative for back pain.  Skin:  Negative for rash.  Neurological:  Negative for seizures and headaches.  Psychiatric/Behavioral:  Negative for hallucinations.     Updated Vital Signs BP (!) 91/56   Pulse 66   Temp 98.3 F (36.8 C) (Oral)   Resp 18   SpO2 92%   Physical Exam Vitals and nursing note reviewed.  Constitutional:      Appearance: She is well-developed.  HENT:     Head: Normocephalic.     Mouth/Throat:     Mouth: Mucous membranes are moist.  Eyes:     General: No scleral icterus.    Conjunctiva/sclera: Conjunctivae normal.  Neck:     Thyroid : No thyromegaly.  Cardiovascular:     Rate and Rhythm: Normal rate and regular rhythm.     Heart sounds: No murmur heard.    No friction rub. No gallop.  Pulmonary:     Breath sounds: No stridor. No wheezing or rales.  Chest:     Chest wall: No tenderness.  Abdominal:     General: There is no distension.     Tenderness: There is no abdominal tenderness. There is no rebound.  Musculoskeletal:        General: Normal range of motion.     Cervical back: Neck supple.  Lymphadenopathy:     Cervical: No cervical adenopathy.  Skin:    Findings: No erythema or rash.  Neurological:     Mental Status: She is alert and oriented to person, place, and time.     Motor: No abnormal muscle tone.     Coordination: Coordination normal.  Psychiatric:        Behavior: Behavior normal.     (all labs ordered are listed, but  only abnormal results are displayed) Labs Reviewed  C DIFFICILE QUICK SCREEN W PCR REFLEX   - Abnormal; Notable for the following components:      Result Value   C Diff antigen POSITIVE (*)    C Diff toxin POSITIVE (*)    All other components within normal limits  CBC WITH DIFFERENTIAL/PLATELET - Abnormal; Notable for the following components:   RBC 2.93 (*)    Hemoglobin 7.9 (*)    HCT 25.4 (*)    Platelets 138 (*)    Abs Immature Granulocytes 0.12 (*)    All other components within normal limits  COMPREHENSIVE METABOLIC PANEL WITH GFR - Abnormal; Notable for the following components:  Sodium 132 (*)    CO2 18 (*)    BUN 31 (*)    Creatinine, Ser 1.87 (*)    Calcium 8.2 (*)    Total Protein 5.8 (*)    Albumin  3.1 (*)    ALT 81 (*)    Alkaline Phosphatase 317 (*)    GFR, Estimated 30 (*)    Anion gap 16 (*)    All other components within normal limits  PROTIME-INR - Abnormal; Notable for the following components:   Prothrombin Time 16.1 (*)    All other components within normal limits  GASTROINTESTINAL PANEL BY PCR, STOOL (REPLACES STOOL CULTURE)  LACTIC ACID, PLASMA  LACTIC ACID, PLASMA    EKG: None  Radiology: CT ABDOMEN PELVIS WO CONTRAST Result Date: 06/21/2024 EXAM: CT ABDOMEN AND PELVIS WITHOUT CONTRAST 06/21/2024 08:36:09 AM TECHNIQUE: CT of the abdomen and pelvis was performed without the administration of intravenous contrast. Multiplanar reformatted images are provided for review. Automated exposure control, iterative reconstruction, and/or weight-based adjustment of the mA/kV was utilized to reduce the radiation dose to as low as reasonably achievable. COMPARISON: CT of the abdomen and pelvis dated 06/09/2024. CLINICAL HISTORY: Abdominal pain, acute, nonlocalized. FINDINGS: LOWER CHEST: There is a small focal patchy opacity present within the left lower lobe. There are hazy and reticular opacities present dependently within the right lower lobe. There is extensive  calcific coronary artery disease. A leadless cardiac pacemaker is present. LIVER: There is mild perihepatic ascites. GALLBLADDER AND BILE DUCTS: The patient is status post cholecystectomy. No biliary ductal dilatation. SPLEEN: The spleen is moderately enlarged, measuring approximately 16 x 14 cm. PANCREAS: No acute abnormality. ADRENAL GLANDS: No acute abnormality. KIDNEYS, URETERS AND BLADDER: No stones in the kidneys or ureters. No hydronephrosis. No perinephric or periureteral stranding. Urinary bladder is unremarkable. GI AND BOWEL: Stomach demonstrates no acute abnormality. The patient is status post sigmoid colectomy. There is focal circumferential thickening of the wall of the splenic flexure of the colon. The ascending and transverse colon are distended with air and fluid. There is no bowel obstruction. PERITONEUM AND RETROPERITONEUM: There is mild perihepatic ascites. No free air. VASCULATURE: The abdominal aorta is normal in caliber and demonstrates mild-to-moderate calcific atheromatous disease. LYMPH NODES: No lymphadenopathy. REPRODUCTIVE ORGANS: The patient is status post hysterectomy and bilateral salpingo-oophorectomy. BONES AND SOFT TISSUES: No acute osseous abnormality. The patient is status post abdominal hernia repair. There is a right-sided fat-containing incisional hernia. The abdominal wall defect measures approximately 12 mm in diameter. IMPRESSION: 1. Focal circumferential wall thickening at the splenic flexure with upstream distention of the ascending and transverse colon by air and fluid, suspicious for a colonic stricture/obstruction; recommend clinical correlation and endoscopic evaluation 2. Moderate splenomegaly measuring approximately 16 x 14 cm 3. Right-sided fat-containing incisional hernia with ~12 mm abdominal wall defect Electronically signed by: Evalene Coho MD 06/21/2024 09:07 AM EST RP Workstation: [id number]   DG Chest Port 1 View Result Date: 06/21/2024 CLINICAL  DATA:  Shortness of breath. EXAM: PORTABLE CHEST 1 VIEW COMPARISON:  06/09/2024 FINDINGS: Right base atelectasis with small right pleural effusion, similar to prior. Left lung clear. The cardiopericardial silhouette is within normal limits for size. No acute bony abnormality. IMPRESSION: Right base atelectasis with small right pleural effusion, similar to prior. Electronically Signed   By: Camellia Candle M.D.   On: 06/21/2024 08:47     Procedures   Medications Ordered in the ED  fidaxomicin (DIFICID) tablet 200 mg (has no administration in time range)  pantoprazole  (PROTONIX ) EC tablet 40 mg (has no administration in time range)  cycloSPORINE  (SANDIMMUNE ) capsule 50 mg (has no administration in time range)  apixaban (ELIQUIS) tablet 5 mg (has no administration in time range)  levothyroxine  (SYNTHROID ) tablet 100 mcg (has no administration in time range)  folic acid  (FOLVITE ) tablet 1 mg (has no administration in time range)  hydrOXYzine  (ATARAX ) tablet 25 mg (has no administration in time range)  traZODone  (DESYREL ) tablet 50 mg (has no administration in time range)  mirtazapine (REMERON SOL-TAB) disintegrating tablet 15 mg (has no administration in time range)  traMADol  (ULTRAM ) tablet 50 mg (has no administration in time range)  sodium chloride  0.9 % bolus 2,000 mL (0 mLs Intravenous Stopped 06/21/24 1133)  sodium chloride  0.9 % bolus 1,000 mL (0 mLs Intravenous Stopped 06/21/24 1224)  dextrose  5 %-0.9 % sodium chloride  infusion ( Intravenous New Bag/Given 06/21/24 1457)   Patient with positive C. difficile and CT is suspicious for colonic stricture.CRITICAL CARE Performed by: Fairy Sermon Total critical care time: 45 minutes Critical care time was exclusive of separately billable procedures and treating other patients. Critical care was necessary to treat or prevent imminent or life-threatening deterioration. Critical care was time spent personally by me on the following activities:  development of treatment plan with patient and/or surrogate as well as nursing, discussions with consultants, evaluation of patient's response to treatment, examination of patient, obtaining history from patient or surrogate, ordering and performing treatments and interventions, ordering and review of laboratory studies, ordering and review of radiographic studies, pulse oximetry and re-evaluation of patient's condition.   I spoke with the hepatology fellow at [facility] in [place].  His name was Dr. Lang.  And it was decided the patient would be better served transfer ported to [facility] to be on the hepatology service.  She we will start treatment for C. difficile here                               Medical Decision Making Amount and/or Complexity of Data Reviewed Labs: ordered. Radiology: ordered.  Risk Prescription drug management.   Status post liver transplant with C. difficile infection.  Also severe dehydration     Final diagnoses:  None    ED Discharge Orders     None          Sermon Fairy, MD 06/21/24 1541

## 2024-06-21 NOTE — ED Notes (Signed)
 Pt daughter updated via incoming call. Daughter expressed concern for the overall wellbeing for pt. Daughter requested pt be started on TPN. MD notified

## 2024-06-21 NOTE — Discharge Instructions (Signed)
 Patient will be transferred to [facility] in [place] to the hepatology department.  Dr. Lang the hepatology fellow was excepting

## 2024-06-21 NOTE — ED Triage Notes (Signed)
 Pt in by [facility] from home. C/o of abd pain, & NVD x2 weeks. Pt w/ hx of liver transplant in may.

## 2024-06-21 NOTE — ED Notes (Signed)
 Pt expresses resistance to further evaluation and or care if that includes being transported to [place]. MD and RN attempted to explain risks/benefits to potential further evaluation. Waiting for final recommendation from specialist at this time.

## 2024-06-21 NOTE — ED Notes (Signed)
 Pt ambulated to restroom w/o assistance. Stool full liquid, foul smelling.

## 2024-06-21 NOTE — ED Notes (Signed)
 Patti, RN

## 2024-06-22 ENCOUNTER — Ambulatory Visit (HOSPITAL_COMMUNITY): Payer: Self-pay

## 2024-06-22 DIAGNOSIS — K219 Gastro-esophageal reflux disease without esophagitis: Secondary | ICD-10-CM | POA: Diagnosis not present

## 2024-06-22 DIAGNOSIS — D84821 Immunodeficiency due to drugs: Secondary | ICD-10-CM | POA: Diagnosis not present

## 2024-06-22 DIAGNOSIS — Z944 Liver transplant status: Secondary | ICD-10-CM | POA: Diagnosis not present

## 2024-06-22 DIAGNOSIS — Z79899 Other long term (current) drug therapy: Secondary | ICD-10-CM | POA: Diagnosis not present

## 2024-06-22 DIAGNOSIS — A09 Infectious gastroenteritis and colitis, unspecified: Secondary | ICD-10-CM | POA: Diagnosis not present

## 2024-06-22 DIAGNOSIS — I959 Hypotension, unspecified: Secondary | ICD-10-CM | POA: Diagnosis not present

## 2024-06-22 DIAGNOSIS — R0902 Hypoxemia: Secondary | ICD-10-CM | POA: Diagnosis not present

## 2024-06-22 DIAGNOSIS — N189 Chronic kidney disease, unspecified: Secondary | ICD-10-CM | POA: Diagnosis not present

## 2024-06-22 DIAGNOSIS — D849 Immunodeficiency, unspecified: Secondary | ICD-10-CM | POA: Diagnosis not present

## 2024-06-22 DIAGNOSIS — Z7901 Long term (current) use of anticoagulants: Secondary | ICD-10-CM | POA: Diagnosis not present

## 2024-06-22 DIAGNOSIS — N179 Acute kidney failure, unspecified: Secondary | ICD-10-CM | POA: Diagnosis not present

## 2024-06-22 DIAGNOSIS — R109 Unspecified abdominal pain: Secondary | ICD-10-CM | POA: Diagnosis not present

## 2024-06-22 DIAGNOSIS — E039 Hypothyroidism, unspecified: Secondary | ICD-10-CM | POA: Diagnosis not present

## 2024-06-22 DIAGNOSIS — Z86718 Personal history of other venous thrombosis and embolism: Secondary | ICD-10-CM | POA: Diagnosis not present

## 2024-06-22 DIAGNOSIS — J449 Chronic obstructive pulmonary disease, unspecified: Secondary | ICD-10-CM | POA: Diagnosis not present

## 2024-06-22 DIAGNOSIS — A0472 Enterocolitis due to Clostridium difficile, not specified as recurrent: Secondary | ICD-10-CM | POA: Diagnosis not present

## 2024-06-22 DIAGNOSIS — Z7401 Bed confinement status: Secondary | ICD-10-CM | POA: Diagnosis not present

## 2024-06-22 DIAGNOSIS — R140 Abdominal distension (gaseous): Secondary | ICD-10-CM | POA: Diagnosis not present

## 2024-06-22 DIAGNOSIS — Z950 Presence of cardiac pacemaker: Secondary | ICD-10-CM | POA: Diagnosis not present

## 2024-06-22 DIAGNOSIS — R1111 Vomiting without nausea: Secondary | ICD-10-CM | POA: Diagnosis not present

## 2024-06-22 DIAGNOSIS — R197 Diarrhea, unspecified: Secondary | ICD-10-CM | POA: Diagnosis not present

## 2024-06-22 DIAGNOSIS — I129 Hypertensive chronic kidney disease with stage 1 through stage 4 chronic kidney disease, or unspecified chronic kidney disease: Secondary | ICD-10-CM | POA: Diagnosis not present

## 2024-06-22 DIAGNOSIS — Z79890 Hormone replacement therapy (postmenopausal): Secondary | ICD-10-CM | POA: Diagnosis not present

## 2024-06-22 DIAGNOSIS — Z90711 Acquired absence of uterus with remaining cervical stump: Secondary | ICD-10-CM | POA: Diagnosis not present

## 2024-06-22 LAB — GASTROINTESTINAL PANEL BY PCR, STOOL (REPLACES STOOL CULTURE)

## 2024-06-22 MED ORDER — TRAMADOL HCL 50 MG PO TABS
50.0000 mg | ORAL_TABLET | Freq: Four times a day (QID) | ORAL | Status: DC | PRN
  Administered 2024-06-22 (×2): 50 mg via ORAL
  Filled 2024-06-22 (×2): qty 1

## 2024-06-28 ENCOUNTER — Ambulatory Visit (INDEPENDENT_AMBULATORY_CARE_PROVIDER_SITE_OTHER): Admitting: Gastroenterology

## 2024-07-03 DIAGNOSIS — Z4823 Encounter for aftercare following liver transplant: Secondary | ICD-10-CM | POA: Diagnosis not present

## 2024-07-03 DIAGNOSIS — Z944 Liver transplant status: Secondary | ICD-10-CM | POA: Diagnosis not present

## 2024-07-03 DIAGNOSIS — Z79899 Other long term (current) drug therapy: Secondary | ICD-10-CM | POA: Diagnosis not present

## 2024-07-05 DIAGNOSIS — R0902 Hypoxemia: Secondary | ICD-10-CM | POA: Diagnosis not present

## 2024-07-05 DIAGNOSIS — J90 Pleural effusion, not elsewhere classified: Secondary | ICD-10-CM | POA: Diagnosis not present

## 2024-07-05 DIAGNOSIS — N189 Chronic kidney disease, unspecified: Secondary | ICD-10-CM | POA: Diagnosis not present

## 2024-07-05 DIAGNOSIS — T50Z94A Poisoning by other vaccines and biological substances, undetermined, initial encounter: Secondary | ICD-10-CM | POA: Diagnosis not present

## 2024-07-09 ENCOUNTER — Other Ambulatory Visit: Payer: Self-pay | Admitting: Gastroenterology

## 2024-07-09 DIAGNOSIS — K219 Gastro-esophageal reflux disease without esophagitis: Secondary | ICD-10-CM

## 2024-07-09 DIAGNOSIS — I8510 Secondary esophageal varices without bleeding: Secondary | ICD-10-CM

## 2024-07-09 NOTE — Telephone Encounter (Signed)
Please have pt schedule appointment. Thank you.

## 2024-07-09 NOTE — Telephone Encounter (Signed)
 Will refill medication for 1 month, needs follow up appointment with any provider in order to receive any refills.  Thanks,  Toribio Fortune, MD Gastroenterology and Hepatology [facility] Gastroenterology

## 2024-07-11 NOTE — Telephone Encounter (Signed)
 Noted

## 2024-07-19 ENCOUNTER — Encounter (INDEPENDENT_AMBULATORY_CARE_PROVIDER_SITE_OTHER): Payer: Self-pay | Admitting: Gastroenterology

## 2024-07-19 ENCOUNTER — Ambulatory Visit (INDEPENDENT_AMBULATORY_CARE_PROVIDER_SITE_OTHER): Admitting: Gastroenterology

## 2024-07-19 VITALS — BP 132/88 | HR 78 | Temp 98.9°F | Ht 62.0 in | Wt 141.3 lb

## 2024-07-19 DIAGNOSIS — Z944 Liver transplant status: Secondary | ICD-10-CM | POA: Insufficient documentation

## 2024-07-19 DIAGNOSIS — K219 Gastro-esophageal reflux disease without esophagitis: Secondary | ICD-10-CM | POA: Diagnosis not present

## 2024-07-19 DIAGNOSIS — K7469 Other cirrhosis of liver: Secondary | ICD-10-CM

## 2024-07-19 DIAGNOSIS — Z8619 Personal history of other infectious and parasitic diseases: Secondary | ICD-10-CM | POA: Insufficient documentation

## 2024-07-19 NOTE — Patient Instructions (Signed)
 Continue follow-up with [facility] transplant [facility] Continue pantoprazole  40 mg twice a day

## 2024-07-19 NOTE — Progress Notes (Signed)
 Toribio Fortune, M.D. Gastroenterology & Hepatology [facility] Gastroenterology [address] [place], [place] [address]  Primary Care Physician: Zamor, Phillippe Jude, MD [address] [place] [address]  I will communicate my assessment and recommendations to the referring MD via EMR.  Problems: GERD liver cirrhosis status post liver transplant. History of C. difficile  History of Present Illness: Karla Young is a 63 y.o. 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 Diff and E. Coli,  hepatitis C Status post treatment and SVR, atrial fibrillation, hypertension, SSS s/p pacemaker placement, who presents for follow up of GERD and liver cirrhosis status post liver transplant.  The patient was last seen on 10/19/2022.  Since that time, the patient underwent liver transplant at [facility] on 12/17/2023.  Patient received immunosuppressive treatment with prednisone, Myfortic, cyclosporine .  Last visit in the transplant clinic was seen August 2025.  Unfortunately, she was hospitalized multiple times as she had multiple compliacations such as bilateral pneumonia, C. Diff and EPEC  infection, AKI, recurrent pleural effusion, SSS s/p pacemaker placement.  Patient received treatment for C. Diff and had some persistent diarrhea - she required to take cholestyramine for some time.Now she is off and having regular bowel movements.  Most recent blood workup from 07/03/2024 showed hemoglobin of 9.5, WBC 4.9, platelets 34,000, CMP with chloride 110 CO2 19, creatinine 1.14, creatinine, BUN 14, AST 58, ALT 32, total bilirubin 0.5, alkaline phosphatase 296, albumin  2.8.  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  She takes pantoprazole  40 mg twice a day. This controls the GERD as she does not have any heartburn or dysphagia, no odynophagia.  The patient denies having any nausea,  vomiting, fever, chills, hematochezia, melena, hematemesis, abdominal distention, abdominal pain, diarrhea, jaundice, pruritus or weight loss.  Last EGD: Jan 2024   Grade I esophageal varices.                           - Portal hypertensive gastropathy.                           - Normal examined duodenum.                           - No specimens collected.  Last Colonoscopy:  Past Medical History: Past Medical History:  Diagnosis Date   Anxiety    Cirrhosis (HCC)    COPD (chronic obstructive pulmonary disease) (HCC)    Fibromyalgia    GERD (gastroesophageal reflux disease)    Heart murmur    Hepatitis C    Hypertension    Hypotension    Hypothyroid    Migraines    MRSA (methicillin resistant staph aureus) culture positive     Past Surgical History: Past Surgical History:  Procedure Laterality Date   ABDOMINAL HYSTERECTOMY     partial   BACK SURGERY     lumbar removal of disc   BIOPSY  09/06/2014   Procedure: BIOPSY;  Surgeon: Claudis RAYMOND Rivet, MD;  Location: [facility];  Service: Endoscopy;;   CHOLECYSTECTOMY     COLONOSCOPY WITH PROPOFOL  N/A 09/06/2014   Procedure: COLONOSCOPY WITH PROPOFOL  (Procedure #2) At cecum at 0819; total withdrawal time=13 minutes;  Surgeon: Claudis RAYMOND Rivet, MD;  Location: [facility];  Service: Endoscopy;  Laterality:  N/A;   COLONOSCOPY WITH PROPOFOL  N/A 12/30/2022   Procedure: COLONOSCOPY WITH PROPOFOL ;  Surgeon: Eartha Angelia Sieving, MD;  Location: [facility];  Service: Gastroenterology;  Laterality: N/A;  11:00am;asa 3   ESOPHAGEAL DILATION N/A 09/06/2014   Procedure: ESOPHAGEAL DILATION WITH MALONEY DILATOR ;  Surgeon: Claudis RAYMOND Rivet, MD;  Location: [facility];  Service: Endoscopy;  Laterality: N/A;   ESOPHAGOGASTRODUODENOSCOPY (EGD) WITH PROPOFOL  N/A 09/06/2014   Procedure: ESOPHAGOGASTRODUODENOSCOPY (EGD) WITH PROPOFOL  (Procedure #1) ;  Surgeon: Claudis RAYMOND Rivet, MD;  Location: [facility];  Service: Endoscopy;  Laterality: N/A;    ESOPHAGOGASTRODUODENOSCOPY (EGD) WITH PROPOFOL  N/A 09/02/2021   Procedure: ESOPHAGOGASTRODUODENOSCOPY (EGD) WITH PROPOFOL ;  Surgeon: Eartha Angelia Sieving, MD;  [facility];  Service: Gastroenterology;  Laterality: N/A;  830   ESOPHAGOGASTRODUODENOSCOPY (EGD) WITH PROPOFOL  N/A 09/02/2022   Procedure: ESOPHAGOGASTRODUODENOSCOPY (EGD) WITH PROPOFOL ;  Surgeon: Eartha Angelia Sieving, MD;  [facility]: [facility];  Service: Gastroenterology;  Laterality: N/A;  1030am, asa 3   HEMORRHOID SURGERY     Dr Lindell   HEMOSTASIS CLIP PLACEMENT  12/30/2022   Procedure: HEMOSTASIS CLIP PLACEMENT;  Surgeon: Eartha Angelia Sieving, MD;  [facility]: [facility];  Service: Gastroenterology;;   ICD IMPLANT     June 2025  [facility]ott   IR RADIOLOGIST EVAL & MGMT  08/26/2022   IR RADIOLOGIST EVAL & MGMT  09/22/2022   LIVER TRANSPLANT  12/16/2023   PARTIAL HYSTERECTOMY      One ovary left   POLYPECTOMY  12/30/2022   Procedure: POLYPECTOMY;  Surgeon: Eartha Angelia Sieving, MD;  Location: [facility];  Service: Gastroenterology;;   RECTAL PROLAPSE REPAIR     WRIST SURGERY  1997    Family History: Family History  Problem Relation Age of Onset   Colon cancer Neg Hx    Colon polyps Neg Hx     Social History:Tobacco Use History[1] Social History   Substance and Sexual Activity  Alcohol Use Not Currently   Comment: used to beer nightly not sure of how many   Social History   Substance and Sexual Activity  Drug Use No    Allergies: Allergies[2]  Medications: Current Outpatient Medications  Medication Sig Dispense Refill   albuterol  (VENTOLIN  HFA) 108 (90 Base) MCG/ACT inhaler Inhale 2 puffs into the lungs every 6 (six) hours as needed for wheezing.     cyclobenzaprine (FLEXERIL) 10 MG tablet Take 10 mg by mouth 3 (three) times daily as needed for muscle spasms.     cycloSPORINE  modified (NEORAL ) 100 MG capsule Take 100 mg by mouth 2 (two) times daily.     ELIQUIS  5 MG  TABS tablet Take 5 mg by mouth in the morning. (Patient taking differently: Take 5 mg by mouth 2 (two) times daily.)     entecavir (BARACLUDE) 0.5 MG tablet Take 0.5 mg by mouth every other day.     ferrous sulfate 325 (65 FE) MG tablet Take 325 mg by mouth daily with breakfast.     folic acid  (FOLVITE ) 1 MG tablet Take 1 mg by mouth daily.     hydrOXYzine  (ATARAX ) 25 MG tablet Take 1 tablet (25 mg total) by mouth every 8 (eight) hours as needed for anxiety or nausea. (Patient taking differently: Take 25 mg by mouth at bedtime as needed for anxiety or nausea.) 30 tablet 1   ipratropium (ATROVENT) 0.06 % nasal spray Place 2 sprays into both nostrils 2 (two) times daily as needed for rhinitis.     levothyroxine  (  SYNTHROID ) 112 MCG tablet Take 112 mcg by mouth every morning.     mirtazapine  (REMERON ) 15 MG tablet Take 15 mg by mouth at bedtime.     pantoprazole  (PROTONIX ) 40 MG tablet Take 1 tablet by mouth twice daily 60 tablet 0   traZODone  (DESYREL ) 50 MG tablet Take 50 mg by mouth at bedtime.     cholestyramine (QUESTRAN) 4 g packet Take 4 g by mouth 3 (three) times daily with meals. (Patient not taking: Reported on 07/19/2024)     mycophenolate (MYFORTIC) 360 MG TBEC EC tablet Take 720 mg by mouth 2 (two) times daily. (Patient not taking: Reported on 06/09/2024)     valACYclovir (VALTREX) 1000 MG tablet Take 1,000 mg by mouth 2 (two) times daily.     No current facility-administered medications for this visit.    Review of Systems: GENERAL: negative for malaise, night sweats HEENT: No changes in hearing or vision, no nose bleeds or other nasal problems. NECK: Negative for lumps, goiter, pain and significant neck swelling RESPIRATORY: Negative for cough, wheezing CARDIOVASCULAR: Negative for chest pain, leg swelling, palpitations, orthopnea GI: SEE HPI MUSCULOSKELETAL: Negative for joint pain or swelling, back pain, and muscle pain. SKIN: Negative for lesions, rash PSYCH: Negative for sleep  disturbance, mood disorder and recent psychosocial stressors. HEMATOLOGY Negative for prolonged bleeding, bruising easily, and swollen nodes. ENDOCRINE: Negative for cold or heat intolerance, polyuria, polydipsia and goiter. NEURO: negative for tremor, gait imbalance, syncope and seizures. The remainder of the review of systems is noncontributory.   Physical Exam: BP 132/88 (BP Location: Left Arm, Patient Position: Sitting, Cuff Size: Normal)   Pulse 78   Temp 98.9 F (37.2 C) (Oral)   Ht 5' 2 (1.575 m)   Wt 141 lb 4.8 oz (64.1 kg)   BMI 25.84 kg/m  GENERAL: The patient is AO x3, in no acute distress. HEENT: Head is normocephalic and atraumatic. EOMI are intact. Mouth is well hydrated and without lesions. NECK: Supple. No masses LUNGS: Clear to auscultation. No presence of rhonchi/wheezing/rales. Adequate chest expansion HEART: RRR, normal s1 and s2. ABDOMEN: Soft, nontender, no guarding, no peritoneal signs, and nondistended. BS +. No masses. EXTREMITIES: Without any cyanosis, clubbing, rash, lesions or edema. NEUROLOGIC: AOx3, no focal motor deficit. SKIN: no jaundice, no rashes  Imaging/Labs: as above  I personally reviewed and interpreted the available labs, imaging and endoscopic files.  Impression and Plan: Karla Young is a 63 y.o. 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 Diff and E. Coli,  hepatitis C Status post treatment and SVR, atrial fibrillation, hypertension, SSS s/p pacemaker placement, who presents for follow up of GERD and liver cirrhosis status post liver transplant.  Patient has done well after undergoing liver transplant.  Even though she had multiple complications that directly associated to the transplant itself, she has been able to overcome this clinically.  Her most recent issue was the presence of C. difficile colitis, which would require antibiotic treatment.   Reassuringly, her diarrhea resolved with this antibiotic course and she is not taking any more antidiarrheals.  I congratulated her for abstaining from alcohol use and advised her to follow close with the [facility] liver transplant clinic.  Patient has been doing well on pantoprazole  twice daily for management of GERD.  She has tried other medications for this without relief of her symptoms.  I advised her to continue taking this medication as prior.  -Continue  follow-up with [facility] liver transplant clinic -Continue pantoprazole  40 mg twice a day  All questions were answered.      Toribio Fortune, MD Gastroenterology and Hepatology [facility] Gastroenterology     [1]  Social History Tobacco Use  Smoking Status Former   Current packs/day: 0.25   Average packs/day: 0.3 packs/day for 30.0 years (7.5 ttl pk-yrs)   Types: Cigarettes   Passive exposure: Past  Smokeless Tobacco Never  Tobacco Comments   3-4 cigs a day since age 48.  [2]  Allergies Allergen Reactions   Cefepime  Other (See Comments)    Neurotoxicity related (July 2024)

## 2024-07-23 DIAGNOSIS — Z944 Liver transplant status: Secondary | ICD-10-CM | POA: Diagnosis not present

## 2024-07-23 DIAGNOSIS — Z79899 Other long term (current) drug therapy: Secondary | ICD-10-CM | POA: Diagnosis not present

## 2024-07-23 DIAGNOSIS — Z4823 Encounter for aftercare following liver transplant: Secondary | ICD-10-CM | POA: Diagnosis not present

## 2024-07-26 ENCOUNTER — Encounter (INDEPENDENT_AMBULATORY_CARE_PROVIDER_SITE_OTHER): Payer: Self-pay | Admitting: *Deleted

## 2024-08-06 ENCOUNTER — Other Ambulatory Visit: Payer: Self-pay | Admitting: Gastroenterology

## 2024-08-06 DIAGNOSIS — I8510 Secondary esophageal varices without bleeding: Secondary | ICD-10-CM

## 2024-08-06 DIAGNOSIS — K219 Gastro-esophageal reflux disease without esophagitis: Secondary | ICD-10-CM

## 2024-08-24 NOTE — H&P (Signed)
 Gastroenterology Preprocedural History and Physical     Chief Complaint/Reason for Procedure: Karla Young is a 64 y.o. female scheduled for a ERCP, for the following indication Biliary Obstruction using deep sedation with propofol  or general anesthesia as per anesthesia provider .  A History and Physical has been performed and patient medication allergies have been reviewed. The patient's tolerance of previous anesthesia has been reviewed. The risks and benefits of the procedure and the sedation options and risks were discussed with the patient. All questions were answered and informed consent obtained.  HPI  Medical History[1]  Surgical History[2]  Family History[3]  Social History   Socioeconomic History   Marital status: Widowed    Spouse name: Not on file   Number of children: Not on file   Years of education: Not on file   Highest education level: Not on file  Occupational History   Not on file  Tobacco Use   Smoking status: Former    Types: Cigarettes    Passive exposure: Past   Smokeless tobacco: Never  Vaping Use   Vaping status: Never Used  Substance and Sexual Activity   Alcohol use: Not Currently    Comment: Last: Nov 2023   Drug use: Not Currently    Types: Cocaine, Marijuana    Comment: Quit: years ago   Sexual activity: Not on file  Other Topics Concern   Not on file  Social History Narrative   Not on file   Social Drivers of Health   Living Situation: Low Risk (06/22/2024)   Living Situation    What is your living situation today?: I have a steady place to live    Think about the place you live. Do you have problems with any of the following? Choose all that apply:: None/None on this list  Food Insecurity: Low Risk (06/22/2024)   Food vital sign    Within the past 12 months, you worried that your food would run out before you got money to buy more: Never true    Within the past 12 months, the food you bought just didn't last  and you didn't have money to get more: Never true  Transportation Needs: No Transportation Needs (06/22/2024)   Transportation    In the past 12 months, has lack of reliable transportation kept you from medical appointments, meetings, work or from getting things needed for daily living? : No  Utilities: Low Risk (06/22/2024)   Utilities    In the past 12 months has the electric, gas, oil, or water  company threatened to shut off services in your home? : No  Safety: Low Risk (06/22/2024)   Safety    How often does anyone, including family and friends, physically hurt you?: Never    How often does anyone, including family and friends, insult or talk down to you?: Never    How often does anyone, including family and friends, threaten you with harm?: Never    How often does anyone, including family and friends, scream or curse at you?: Never  Alcohol Screening: Not At Risk (10/20/2022)   Alcohol    Audit C Alcohol risk score: 0  Tobacco Use: Medium Risk (08/24/2024)   Patient History    Smoking Tobacco Use: Former    Smokeless Tobacco Use: Never    Passive Exposure: Past  Depression: Not At Risk (04/06/2024)   PHQ-2    PHQ-2 Score: 0  Social Connections: Moderately Integrated (04/06/2023)   Received from [facility]   Social  Connection and Isolation Panel    In a typical week, how many times do you talk on the phone with family, friends, or neighbors?: Three times a week    How often do you get together with friends or relatives?: Three times a week    How often do you attend church or religious services?: 1 to 4 times per year    Do you belong to any clubs or organizations such as church groups, unions, fraternal or athletic groups, or school groups?: Yes    How often do you attend meetings of the clubs or organizations you belong to?: 1 to 4 times per year    Are you married, widowed, divorced, separated, never married, or living with a partner?: Widowed  Physicist, Medical  Strain: Low Risk (04/06/2023)   Received from [facility]   Overall Financial Resource Strain (CARDIA)    Difficulty of Paying Living Expenses: Not hard at all    Current Rx ordered in [facility][4]  Allergies[5]    Physical Exam:  Vitals:   08/24/24 0649  BP: 128/81  Pulse: 93  Resp: 14  Temp: 97.7 F (36.5 C)  TempSrc: Temporal  SpO2: 99%  Weight: 63 kg (139 lb)  Height: 1.575 m (5' 2)   Body mass index is 25.42 kg/m.  Airway:  MALLAMPATI TWO   Heart:  normal S1 and S2 Lungs:  clear Abdomen:  soft, nontender, normal bowel sounds Mental Status:  awake and alert; oriented to person, place, and time     Plan for ERCP for evaluation of elevated alkphos in setting of recent liver tx.  Risk and benefit discussed.   I have reviewed patient's health history and patient is cleared to proceed with the proposed procedure at this facility.   Elden Newport, MD       [1] Past Medical History: Diagnosis Date   Alcoholic cirrhosis    (CMD)    Ascites of liver 08/03/2022   COPD (chronic obstructive pulmonary disease) (CMD) 08/06/2021   Last Assessment & Plan:   Formatting of this note might be different from the original.  Stable.  -Resume home regimen   Esophageal varices (CMD) 09/02/2022   Fibromyalgia 10/20/2022   GERD (gastroesophageal reflux disease) 08/03/2022   History of hepatitis C post treatment with cure 02/22/2022   Hypertension 08/06/2021   Last Assessment & Plan:   Formatting of this note might be different from the original.  Hypotensive, systolic down to 97.  -Hold lisinopril  5 mg, Lasix  40 mg and spironolactone  100 for now   Hypothyroidism 02/12/2014   Irritable bowel syndrome (IBS) 08/03/2021   Other cirrhosis of liver (CMD) 08/03/2021   Last Assessment & Plan:   Formatting of this note might be different from the original.  Alcoholic liver cirrhosis with ascites.  Abdomen distended.  Has not had paracentesis in the past.  Tmax 99.1.   WBC 6.6.  Influenza and COVID test negative.  INR - 2.1.  With thrombocytopenia of 71.  T. bili 6.1.  -Ultrasound-guided paracentesis in a.m.  - Ascitic Fluid analysis  -Continue 2 g ceftriaxone    SBP (spontaneous bacterial peritonitis)    (CMD) 02/27/2023  [2] Past Surgical History: Procedure Laterality Date   BACK SURGERY     CARDIAC ELECTROPHYSIOLOGY PROCEDURE N/A 01/20/2024   Leadless pacemaker implant performed by Fredia Marden Fairly, MD at [facility]   CESAREAN SECTION     CHOLECYSTECTOMY     COLONOSCOPY  2016   ESOPHAGOGASTRODUODENOSCOPY  08/2019  HEMORROIDECTOMY     LIVER TRANSPLANTATION N/A 12/17/2023   TRANSPLANT LIVER  [id number]  [id number] performed by Aloysius Donnamarie Overland, MD at [facility] OR   PARTIAL HYSTERECTOMY     1 ovary remaining.   RECTAL PROLAPSE REPAIR     WRIST SURGERY    [3] Family History Problem Relation Name Age of Onset   Coronary artery disease Mother     Heart failure Mother     Fibromyalgia Mother     Heart attack Father     Gout Sister Suzen Derick Gavel' disease Sister Suzen Derick    Kidney disease Sister Suzen Dragon    Hypertension Sister Suzen Dragon    Metabolic syndrome Sister Suzen Dragon    Diabetes Brother     No Known Problems Daughter [name]    Alcohol abuse Maternal Uncle     Cirrhosis Maternal Uncle     Lung cancer Other         paternal aunts/uncles   Autoimmune disease Neg Hx    [4] Meds Ordered in [facility]  Medication Sig Dispense Refill   levothyroxine  (SYNTHROID ) 112 mcg tablet Take 1 tablet (112 mcg total) by mouth in the morning. 30 tablet 11   mirtazapine  (REMERON ) 15 mg tablet TAKE 1 TABLET BY MOUTH ONCE DAILY AT BEDTIME 30 tablet 0   pantoprazole  (PROTONIX ) 40 mg EC tablet Take 40 mg by mouth 2 (two) times a day.     traZODone  (DESYREL ) 50 mg tablet TAKE 1 TABLET BY MOUTH EVERY DAY AT BEDTIME AS NEEDED FOR SLEEP 30 tablet 0   acetaminophen  (TYLENOL ) 325 mg tablet Take 650 mg by mouth  as needed for mild pain (1-3) or moderate pain (4-6).     albuterol  HFA (PROVENTIL  HFA;VENTOLIN  HFA;PROAIR  HFA) 90 mcg/actuation inhaler Inhale 2 puffs every 6 (six) hours as needed for wheezing. 1 each 0   apixaban  (ELIQUIS ) 5 mg tab Take 1 tablet (5 mg total) by mouth 2 (two) times a day. 180 tablet 1   cholestyramine light 4 gram packet Take 1 packet (4 g total) by mouth 4 (four) times a day. **DO NOT TAKE WITH IN 2 HOURS OF OTHER MEDICATIONS** 360 packet 0   cycloSPORINE  modified (NEORAL ) 100 mg capsule Take 1 capsule (100 mg total) by mouth in the morning and 1 capsule (100 mg total) in the evening.     [Paused] cycloSPORINE  modified (NEORAL ) 25 mg capsule Take 2 capsules (50 mg total) by mouth 2 (two) times a day. 120 capsule 2   entecavir (BARACLUDE) 0.5 mg tab tablet Take 1 tablet (0.5 mg total) by mouth every other day. 45 tablet 3   ferrous sulfate 325 mg (65 mg iron) tablet Take 1 tablet (325 mg total) by mouth daily before breakfast. 90 tablet 0   folic acid  (FOLVITE ) 1 mg tablet Take 1 tablet (1 mg total) by mouth daily. 90 tablet 0   hydrOXYzine  (ATARAX ) 25 mg tablet Take 25 mg by mouth daily as needed for anxiety.     ipratropium (ATROVENT) 42 mcg (0.06 %) spry nasal spray Administer 2 sprays into each nostril 2 (two) times a day as needed (nasal drip). 15 mL 1   lidocaine  (LMX) 4 % cream Apply 1 Application topically as needed for mild pain (1-3). 28 g 1   [Paused] mycophenolate (MYFORTIC) 360 mg TbEC DR tablet Take 2 tablets (720 mg total) by mouth 2 (two) times a day. 120 tablet 11   Current Facility-Administered Medications Ordered in  [facility]  Medication Dose Route Frequency Provider Last Rate Last Admin   lactated ringer 's infusion  50 mL/hr intravenous Continuous Varun Kesar, MD      [5] Allergies Allergen Reactions   Maxipime  [Cefepime ]     Neurotoxicity related (July 2024)

## 2024-08-24 NOTE — Anesthesia Postprocedure Evaluation (Signed)
 Patient: Karla Young  Procedure Summary     Date: 08/24/24 Room / Location: [facility] - ENDOSCOPY   Anesthesia Start: [phone] Anesthesia Stop: [phone]   Procedure: ENDOSCOPIC RETROGRADE CHOLANGIOPANCREATOGRAPHY Diagnosis:      S/P liver transplant    (CMD)     Bile duct stricture (CMD)   Scheduled Providers: Elden Newport, MD; Devere Judd Bailey, CRNA Responsible Provider: Harlene Bridgette Archer, MD   Anesthesia Type: general ASA Status: 3       Anesthesia Type: general  Vitals Value Taken Time  BP 131/83 08/24/24 09:19  Temp 97.6 F (36.4 C) 08/24/24 08:50  Pulse 79 08/24/24 09:19  Resp 14 08/24/24 09:19  SpO2 100 % 08/24/24 09:19  Vitals shown include unfiled device data.  There were no known notable events for this encounter.  Anesthesia Post Evaluation  Final anesthesia type: general Patient location during evaluation: Bedside Patient participation: complete - patient participated Level of consciousness: awake and alert Pain score: 0 Pain management: adequate Post-op vital signs: post-procedure vital signs are stable Cardiovascular status: acceptable and hemodynamically stable Respiratory status: acceptable Hydration status: acceptable Post-op disposition: Home Anesthesia post-op complications?:no complications

## 2024-08-29 ENCOUNTER — Telehealth (INDEPENDENT_AMBULATORY_CARE_PROVIDER_SITE_OTHER): Payer: Self-pay

## 2024-08-29 NOTE — Telephone Encounter (Signed)
 Who is your primary care physician: Dr. Rosamond  Are you diabetic? If yes, Type 1 or Type 2?    no  Do you have a prosthetic or mechanical heart valve? no  Do you have a pacemaker/defibrillator?   Yes, leadless  Have you had endocarditis/atrial fibrillation? no  Have you had joint replacement within the last 12 months?  no  Do you tend to be constipated or have to use laxatives? no  Do you have any history of drugs or alcohol?  yes  Do you use supplemental oxygen ?  no  Have you had a stroke or heart attack within the last 6 months? no  Do you take weight loss medication?  no  For female patients: have you had a hysterectomy?  yes                                     are you post menopausal?       no                                            do you still have your menstrual cycle? no      Do you take any blood-thinning medications such as: (aspirin, warfarin, Plavix, Aggrenox)  yes  If yes we need the name, milligram, dosage and who is prescribing doctor Eliquis , Dr. Zamor  Current Outpatient Medications  Medication Sig Dispense Refill   albuterol  (VENTOLIN  HFA) 108 (90 Base) MCG/ACT inhaler Inhale 2 puffs into the lungs every 6 (six) hours as needed for wheezing.     cycloSPORINE  modified (NEORAL ) 100 MG capsule Take 100 mg by mouth 2 (two) times daily.     ELIQUIS  5 MG TABS tablet Take 5 mg by mouth in the morning. (Patient taking differently: Take 5 mg by mouth 2 (two) times daily.)     entecavir (BARACLUDE) 0.5 MG tablet Take 0.5 mg by mouth every other day.     ferrous sulfate 325 (65 FE) MG tablet Take 325 mg by mouth daily with breakfast.     hydrOXYzine  (ATARAX ) 25 MG tablet Take 1 tablet (25 mg total) by mouth every 8 (eight) hours as needed for anxiety or nausea. (Patient taking differently: Take 25 mg by mouth at bedtime as needed for anxiety or nausea.) 30 tablet 1   levothyroxine  (SYNTHROID ) 112 MCG tablet Take 112 mcg by mouth every morning.     mirtazapine  (REMERON )  15 MG tablet Take 15 mg by mouth at bedtime.     pantoprazole  (PROTONIX ) 40 MG tablet Take 1 tablet by mouth twice daily 60 tablet 11   traZODone  (DESYREL ) 50 MG tablet Take 50 mg by mouth at bedtime.     No current facility-administered medications for this visit.    Allergies[1]  Primary Insurance Name: [employer]  Best number where you can be reached: [phone]     [1]  Allergies Allergen Reactions   Cefepime  Other (See Comments)    Neurotoxicity related (July 2024)

## 2024-08-29 NOTE — Telephone Encounter (Signed)
 No need for EGD at this time as patient recently had [facility] at [facility] and following with them   Can follow up with us  in the GI clinic

## 2024-08-30 NOTE — Telephone Encounter (Signed)
Called pt and made aware. She voiced understanding 

## 2024-08-31 NOTE — Telephone Encounter (Signed)
 Questionnaire from recall, no referral needed

## 2024-09-06 ENCOUNTER — Other Ambulatory Visit: Payer: Self-pay | Admitting: Nurse Practitioner

## 2024-09-06 DIAGNOSIS — Z1231 Encounter for screening mammogram for malignant neoplasm of breast: Secondary | ICD-10-CM

## 2024-09-11 ENCOUNTER — Inpatient Hospital Stay: Admission: RE | Admit: 2024-09-11 | Source: Ambulatory Visit

## 2024-09-11 ENCOUNTER — Telehealth (INDEPENDENT_AMBULATORY_CARE_PROVIDER_SITE_OTHER): Payer: Self-pay

## 2024-09-12 ENCOUNTER — Other Ambulatory Visit (INDEPENDENT_AMBULATORY_CARE_PROVIDER_SITE_OTHER): Payer: Self-pay | Admitting: Gastroenterology

## 2024-09-12 MED ORDER — LANSOPRAZOLE 30 MG PO CPDR: 30.0000 mg | DELAYED_RELEASE_CAPSULE | Freq: Two times a day (BID) | ORAL | 2 refills | Status: AC

## 2024-09-12 NOTE — Telephone Encounter (Signed)
 I spoke with the patient and made her aware that the PPI bid, was no longer covered by her insurance. She says we can send in an alternative. Patient says she has tried in the past Prilosec, pantoprazole , esomeprazole , Pepcid . She denies trying lansoprazole , Rabeprazole , or Dexilant. Patient uses [facility]. Please advise.

## 2024-09-12 NOTE — Telephone Encounter (Signed)
 Prior auth submitted to plan for Lansoprazole  30 mg bid.

## 2024-09-12 NOTE — Telephone Encounter (Signed)
 I called and left a detailed message that the new medication for reflux has been sent [facility] in [place].I asked that she please return call to the office.
